# Patient Record
Sex: Female | Born: 1935 | Race: White | Hispanic: No | Smoking: Never smoker
Health system: Southern US, Community
[De-identification: ages and names within clinical notes are randomized; demographics above are authoritative.]

## PROBLEM LIST (undated history)

## (undated) DIAGNOSIS — I10 Essential (primary) hypertension: Secondary | ICD-10-CM

## (undated) DIAGNOSIS — N189 Chronic kidney disease, unspecified: Secondary | ICD-10-CM

## (undated) DIAGNOSIS — F329 Major depressive disorder, single episode, unspecified: Secondary | ICD-10-CM

## (undated) DIAGNOSIS — K219 Gastro-esophageal reflux disease without esophagitis: Secondary | ICD-10-CM

## (undated) DIAGNOSIS — I739 Peripheral vascular disease, unspecified: Secondary | ICD-10-CM

## (undated) DIAGNOSIS — G2581 Restless legs syndrome: Secondary | ICD-10-CM

## (undated) DIAGNOSIS — F32A Depression, unspecified: Secondary | ICD-10-CM

## (undated) DIAGNOSIS — I499 Cardiac arrhythmia, unspecified: Secondary | ICD-10-CM

## (undated) DIAGNOSIS — Z9289 Personal history of other medical treatment: Secondary | ICD-10-CM

## (undated) DIAGNOSIS — M1990 Unspecified osteoarthritis, unspecified site: Secondary | ICD-10-CM

## (undated) HISTORY — PX: JOINT REPLACEMENT: SHX530

## (undated) HISTORY — PX: SEPTOPLASTY: SUR1290

## (undated) HISTORY — PX: EYE SURGERY: SHX253

## (undated) HISTORY — PX: VARICOSE VEIN SURGERY: SHX832

## (undated) HISTORY — PX: APPENDECTOMY: SHX54

## (undated) HISTORY — PX: ABDOMINAL HYSTERECTOMY: SHX81

---

## 2000-04-08 ENCOUNTER — Encounter: Payer: Self-pay | Admitting: Family Medicine

## 2000-04-08 ENCOUNTER — Encounter: Admission: RE | Admit: 2000-04-08 | Discharge: 2000-04-08 | Payer: Self-pay | Admitting: Family Medicine

## 2000-05-31 ENCOUNTER — Ambulatory Visit (HOSPITAL_COMMUNITY): Admission: RE | Admit: 2000-05-31 | Discharge: 2000-05-31 | Payer: Self-pay | Admitting: Gastroenterology

## 2001-03-30 ENCOUNTER — Encounter: Payer: Self-pay | Admitting: Emergency Medicine

## 2001-03-30 ENCOUNTER — Emergency Department (HOSPITAL_COMMUNITY): Admission: EM | Admit: 2001-03-30 | Discharge: 2001-03-30 | Payer: Self-pay | Admitting: Emergency Medicine

## 2002-04-21 ENCOUNTER — Ambulatory Visit (HOSPITAL_COMMUNITY): Admission: RE | Admit: 2002-04-21 | Discharge: 2002-04-21 | Payer: Self-pay | Admitting: Internal Medicine

## 2002-04-21 ENCOUNTER — Encounter: Payer: Self-pay | Admitting: Internal Medicine

## 2005-04-24 ENCOUNTER — Ambulatory Visit: Payer: Self-pay | Admitting: Internal Medicine

## 2005-08-28 ENCOUNTER — Ambulatory Visit: Payer: Self-pay | Admitting: Internal Medicine

## 2005-08-31 ENCOUNTER — Ambulatory Visit: Payer: Self-pay | Admitting: Cardiology

## 2006-05-05 ENCOUNTER — Ambulatory Visit: Payer: Self-pay | Admitting: Internal Medicine

## 2007-05-30 ENCOUNTER — Encounter: Payer: Self-pay | Admitting: Internal Medicine

## 2007-05-30 DIAGNOSIS — J309 Allergic rhinitis, unspecified: Secondary | ICD-10-CM | POA: Insufficient documentation

## 2007-05-30 DIAGNOSIS — H6980 Other specified disorders of Eustachian tube, unspecified ear: Secondary | ICD-10-CM

## 2007-09-22 ENCOUNTER — Encounter: Admission: RE | Admit: 2007-09-22 | Discharge: 2007-09-22 | Payer: Self-pay | Admitting: Family Medicine

## 2008-02-15 ENCOUNTER — Inpatient Hospital Stay (HOSPITAL_COMMUNITY): Admission: EM | Admit: 2008-02-15 | Discharge: 2008-02-17 | Payer: Self-pay | Admitting: Emergency Medicine

## 2008-03-12 ENCOUNTER — Encounter: Admission: RE | Admit: 2008-03-12 | Discharge: 2008-03-12 | Payer: Self-pay | Admitting: Family Medicine

## 2010-07-13 DIAGNOSIS — Z9289 Personal history of other medical treatment: Secondary | ICD-10-CM

## 2010-07-13 HISTORY — DX: Personal history of other medical treatment: Z92.89

## 2010-11-25 NOTE — H&P (Signed)
NAMETENNA, LACKO NO.:  [id number]   MEDICAL RECORD NO.:  [id number]          PATIENT TYPE:  INP   LOCATION:  5504                         FACILITY:  [facility]   PHYSICIAN:  Michiel Cowboy, MDDATE OF BIRTH:  08/23/1927   DATE OF ADMISSION:  02/16/2008  DATE OF DISCHARGE:                              HISTORY & PHYSICAL   PRIMARY CARE Viera Okonski:  Dr. Foy Guadalajara.   CHIEF COMPLAINT:  Elevated blood pressure.   The patient is a 77 year old female with history of hypertension,  allergic rhinitis, eustachian dysfunction and GERD, who for the past few  weeks has been undergoing changes to her medications secondary to her  blood pressure being very labile, fluctuating from 90s systolic to 120  systolic.  Dr. Foy Guadalajara has changed her medications.  He stopped her  atenolol and terazosin and put her on Bystolic which is a different beta-  blocker.  He continued her enalapril.  Unfortunately soon thereafter,  her blood pressure went high and was having a couple readings in the  200s.  She called the office and was told to present to the emergency  department, she did.  She denied any chest pain or shortness of breath.  She does endorse some substernal burning and dyspepsia that has been  going on for a few days now.  She also reports that overall she is  feeling weak and unwell.  She has been having an unsteady gait for the  past 2 months or so.  No fevers, no chills, no nausea, no vomiting, no  diarrhea, no constipation.  Otherwise review of systems is negative.   PAST MEDICAL HISTORY:  Per above.   CURRENT MEDICATIONS:  1. Enalapril 20 mg p.o. daily.  2. Neurontin 300 mg p.o. twice daily.  3. Protonix 20 mg p.o. daily.  4. Multivitamin.  5. Vitamin D.  6. Ocuvite.  7. Fish oil.   SOCIAL HISTORY:  The patient does not smoke, drink or use drugs.  Lives  at home.   FAMILY HISTORY:  Noncontributory.   PHYSICAL EXAMINATION:  VITAL SIGNS:  Temperature 97.6, heart rate  61,  respirations 15, blood pressure 176/80.  Sating 98% on room air.  GENERAL:  The patient appears to be in no acute distress.  Elderly  female, very pleasant.  HEENT:  Head nontraumatic.  Moist mucous membranes.  SKIN:  Slightly diminished skin turgor.  LUNGS:  Clear to auscultation bilaterally.  HEART:  Regular rate and rhythm.  No murmurs, rubs or gallops.  ABDOMEN:  Soft with slight epigastric tenderness.  LOWER EXTREMITIES:  Without edema.  NEUROLOGIC:  Intact.   LABORATORY DATA:  White blood cell count 5.2, hemoglobin 12.2, sodium  128, potassium 4.3, creatinine 1.4.  Cardiac enzymes negative x2.  Urine  osmolality 150, urine sodium 44, but this was after administration of  normal saline.  UA showed no evidence of infection.  No chest x-ray was  obtained, but EKG showing sinus bradycardia, heart rate 59.   ASSESSMENT/PLAN:  This is a 77 year old female with severe hypertension,  poorly controlled on current medications.   1. Hypertension.  Looking back, the patient was on 100 of atenolol,      was switched to 5 of Bystolic, which is unsure of dose, maybe this      explaining why she is now hypertensive.  Will increase the Bystolic      to 20 with holding parameters.  Continue enalapril.  Add Norvasc      and titrate up if needed.  Hydralazine p.r.n.  2. Hyponatremia.  Perhaps may explain some unsteadiness on her feet      that she has been experiencing, although not sure what the duration      of hyponatremia.  The patient appears slightly dehydrated, will      give IV fluids and monitor.  Will check orthostatics.  Will also      check TSH and cortisol level.  Chest x-ray.  3. Epigastric burning.  Wonder if this is secondary to      gastroesophageal reflux disease.  Will increase dose of Protonix to      40 and give some Carafate to see if this improves this.  4. Unsteady gait.  Will have formal physical therapy/occupational      therapy evaluation.  Will check B12 and  folate level, TSH and would      have a CT scan of the head done as this has been going on for a      couple of months and if she had a stroke this should show.  5. Prophylaxis, Protonix plus sequential compression devices.  6. The patient is do not resuscitate/do not intubate as per her      wishes.      Michiel Cowboy, MD  Electronically Signed     AVD/MEDQ  D:  02/16/2008  T:  02/16/2008  Job:  [id number]   cc:   Molly Maduro L. Foy Guadalajara, M.D.

## 2010-11-25 NOTE — Discharge Summary (Signed)
NAMELUCI, BELLUCCI NO.:  [id number]   MEDICAL RECORD NO.:  [id number]          PATIENT TYPE:  INP   LOCATION:  5504                         FACILITY:  [facility]   PHYSICIAN:  Hollice Espy, M.D.DATE OF BIRTH:  02-25-1928   DATE OF ADMISSION:  02/15/2008  DATE OF DISCHARGE:  02/17/2008                               DISCHARGE SUMMARY   PRIMARY CARE PHYSICIAN:  Robert L. Foy Guadalajara, MD   DISCHARGE DIAGNOSES:  1. Malignant hypertension.  2. Abnormal lab findings, not otherwise specified.  3. Deconditioning.  4. Dehydration with intravascular volume depletion, now resolved.   Discharge medications for this patient, the patient's antihypertensive  medications have been clarified.  Her new regimen, at least for the  short term, will be as follows:  1. Enalapril 20.  2. Neurontin 300 b.i.d.  3. Protonix 40 which is an increase from previous of 20.  4. Multivitamin daily.  5. Vitamin D 1200 p.o. daily.  6. Ocuvite 1 tab p.o. daily.  7. Omega-3 fish oil daily.  8. Bystolic 20 mg p.o. daily.  The patient previously was unclear of      she is on what dose of Bystolic she is on and thought it might be      5, it has been now increased, so at least it is going to be at 20      mg p.o. daily.   HOSPITAL COURSE:  The patient is a 77 year old white female with past  medical history of resistant hypertension in the past who was  complaining of weakness and dizziness and had an elevated blood pressure  in the systolically 200s upon admission.  Initially, despite her  elevated blood pressures, thought she felt that she was intravascularly  volume depleted and she was started on IV fluids.  She was noted to be  dizzy every time she stood up or sat up.  Following IV fluids by  hospital day #2, she was feeling much better.  She was evaluated by  Physical and Occupational Therapy who found her to be deconditioned and  recommended home heath PT/OT.  In the meantime, her PCP who  had been  following her blood pressure issues was concerned about the possibility  of the patient having a pheochromocytoma, a rare malignant catecholamine-  secreting tumor.  The patient had a fractionated metanephrine test done  drawn from the blood in Dr. Pablo Lawrence office, and these results came back  elevated for serum plasma norepinephrine level elevated at 638 with the  normal level being between 0 and 399.  After Dr. Foy Guadalajara received these  results, he contacted myself and we discussed treatment options from  there.  After I reviewed the literature of pheochromocytoma, it was  found that the sensitivity for pheochromocytoma drops after the patient  has an age older than 67 and the best confirmatory test for the patient  will be a combination of a plasma fractionated metanephrine test and a  24-hour urine collection for metanephrines and catecholamines as well as  checking urinary creatinine to assure adequate volume intake, the  adequate volume standby.  The patient currently is undergoing her 24-  hour urine collection to ensure that she has had a full urine intake so  as to allow [place] to further monitor her blood pressure.  She will complete  her urinary collection at 2 p.m. at this time, then it will be a send-  off lab for metanephrine analysis.  In the meantime, the patient's blood  pressure remained stable.  She had 1 brief spike with a systolic of 190  at 11 p.m. on February 15, 2008.  However, overall her blood pressure  continues to remain improved and she feels stable and  steady.  The patient's overall disposition is improved.  Her activity is  as tolerated by PT/OT.  Her discharge diet is low-sodium diet, and she  will follow up with Dr. Marinda Elk, PCP next week in his office.  At  that time, he will evaluate her urinary analysis to determine if she  truly has pheochromocytoma.      Hollice Espy, M.D.  Electronically Signed     SKK/MEDQ  D:  02/17/2008  T:   02/18/2008  Job:  [id number]   cc:   Molly Maduro L. Foy Guadalajara, M.D.

## 2010-11-28 NOTE — Assessment & Plan Note (Signed)
[facility]                               PULMONARY OFFICE NOTE   [name], [name]                      MRN:          [id number]  DATE:05/05/2006                            DOB:          Dec 07, 1927    PROBLEM:  1. Allergic rhinitis.  2. Vasomotor rhinitis.  3. Eustachian dysfunction.   HISTORY:  She returns for follow-up after last here in February to tell me  that she feels fine with her nose and sinuses now. A CT scan of her sinuses  in February 2007, had shown a sphenoid opacification and partial involvement  of the posterior ethmoid sinuses consistent with a sinusitis. That resolved  with Avalox for two weeks. She has had no problems since then through the  summer and feels well today. She is using Astelin p.r.n. I offered flu  vaccine and she declined.   MEDICATIONS:  1. Enalapril 20 mg.  2. Terazosin.  3. Atenolol 100 mg.  4. Gabapentin 300 mg.  5. Premarin 6.25.  6. Guaifenesin-pseudoephedrine generic decongestant p.r.n.  7. Prilosec.  8. Astelin.   DRUG INTOLERANCES:  PENICILLIN AND ASPIRIN.   OBJECTIVE:  Weight: 177 pounds.  Blood pressure: 126/80.  Pulse: Regular,  58.  Room air saturation: 98%.  Her conjunctivae are clear. Nasopharynx is clear with no evidence of  drainage and unobstructed. There is no peri-orbital edema. No stridor.  LUNGS:  Clear.  HEART: Heart sounds regular without murmur.   IMPRESSION:  1. Allergic rhinitis, under good control.  2. Sinusitis, symptomatically resolved after antibiotics last winter.   PLAN:  1. We refilled her guaifenesin-pseudoephedrine product for b.i.d. p.r.n.      use and refilled      Astelin.  2. Schedule return one year, earlier p.r.n.       Clinton D. Maple Hudson, MD, FCCP, FACP      CDY/MedQ  DD:  05/05/2006  DT:  05/06/2006  Job #:  [id number]   cc:   Molly Maduro L. Foy Guadalajara, M.D.

## 2010-11-28 NOTE — Procedures (Signed)
[place]  Patient:    [name], [name]                      MRN: [id number] Proc. Date: 05/31/00 Adm. Date:  [id number] Attending:  Rich Brave CC:         Marinda Elk, M.D.   Procedure Report  PROCEDURE:  Upper endoscopy.  INDICATION:  Longstanding reflux symptoms, which have been somewhat refractory to antireflux therapy.  FINDINGS:  Normal exam.  DESCRIPTION OF PROCEDURE:  The nature, purpose, and risks of the procedure had been reviewed with the patient, who provided written consent.  Sedation for this procedure and the colonoscopy which followed it totalled fentanyl 75 mcg and Versed 7 mg IV without arrhythmias or desaturation.  The Olympus video endoscope was passed under direct vision quite easily.  The vocal cords and larynx looked grossly normal, without overt reflux-related changes.  The esophageal mucosa was normal, without evidence of Barretts esophagus, reflux esophagitis, varices, infection, or neoplasia.  No ring, stricture, or hiatal hernia was identified.  The squamocolumnar junction was noted to be right at the level of the diaphragmatic hiatus.  The stomach was entered.  It contained no significant residual and had normal mucosa without evidence of gastritis, erosion, polyps, or masses, and a retroflex view of the proximal stomach was unremarkable.  Again, there was no evidence of a hiatal hernia.  The pylorus, duodenal bulb, and second duodenum were unremarkable in appearance.  The scope was removed from the patient, who tolerated the procedure well and without apparent complication.  No biopsies were obtained.  IMPRESSION:  Normal upper endoscopy.  PLAN:  Continue antipeptic therapy to control symptoms. DD:  05/31/00 TD:  05/31/00 Job: [id number]

## 2011-03-10 ENCOUNTER — Other Ambulatory Visit (HOSPITAL_COMMUNITY): Payer: Self-pay | Admitting: Anesthesiology

## 2011-03-10 ENCOUNTER — Other Ambulatory Visit: Payer: Self-pay | Admitting: Orthopedic Surgery

## 2011-03-10 ENCOUNTER — Other Ambulatory Visit (HOSPITAL_COMMUNITY): Payer: Self-pay | Admitting: Orthopedic Surgery

## 2011-03-10 ENCOUNTER — Encounter (HOSPITAL_COMMUNITY): Payer: Medicare Other

## 2011-03-10 ENCOUNTER — Ambulatory Visit (HOSPITAL_COMMUNITY)
Admission: RE | Admit: 2011-03-10 | Discharge: 2011-03-10 | Disposition: A | Discharge status: Home / Self Care | Payer: Medicare Other | Source: Ambulatory Visit | Attending: Anesthesiology | Admitting: Anesthesiology

## 2011-03-10 DIAGNOSIS — J449 Chronic obstructive pulmonary disease, unspecified: Secondary | ICD-10-CM | POA: Insufficient documentation

## 2011-03-10 DIAGNOSIS — J4489 Other specified chronic obstructive pulmonary disease: Secondary | ICD-10-CM | POA: Insufficient documentation

## 2011-03-10 DIAGNOSIS — Z01812 Encounter for preprocedural laboratory examination: Secondary | ICD-10-CM | POA: Insufficient documentation

## 2011-03-10 DIAGNOSIS — Z01811 Encounter for preprocedural respiratory examination: Secondary | ICD-10-CM

## 2011-03-10 DIAGNOSIS — M1710 Unilateral primary osteoarthritis, unspecified knee: Secondary | ICD-10-CM | POA: Insufficient documentation

## 2011-03-10 DIAGNOSIS — Z01818 Encounter for other preprocedural examination: Secondary | ICD-10-CM | POA: Insufficient documentation

## 2011-03-10 LAB — URINALYSIS, ROUTINE W REFLEX MICROSCOPIC
Glucose, UA: NEGATIVE mg/dL
Ketones, ur: NEGATIVE mg/dL
Leukocytes, UA: NEGATIVE
Nitrite: NEGATIVE
Protein, ur: NEGATIVE mg/dL
Urobilinogen, UA: 0.2 mg/dL (ref 0.0–1.0)

## 2011-03-10 LAB — COMPREHENSIVE METABOLIC PANEL
Albumin: 3.8 g/dL (ref 3.5–5.2)
Alkaline Phosphatase: 76 U/L (ref 39–117)
BUN: 17 mg/dL (ref 6–23)
Calcium: 10 mg/dL (ref 8.4–10.5)
Creatinine, Ser: 1.12 mg/dL — ABNORMAL HIGH (ref 0.50–1.10)
GFR calc Af Amer: 56 mL/min — ABNORMAL LOW (ref >60)
Glucose, Bld: 100 mg/dL — ABNORMAL HIGH (ref 70–99)
Total Protein: 7.3 g/dL (ref 6.0–8.3)

## 2011-03-10 LAB — APTT: aPTT: 32 seconds (ref 24–37)

## 2011-03-10 LAB — CBC
Hemoglobin: 12.8 g/dL (ref 12.0–15.0)
MCH: 30.3 pg (ref 26.0–34.0)
MCHC: 34.6 g/dL (ref 30.0–36.0)
Platelets: 212 10*3/uL (ref 150–400)

## 2011-03-10 LAB — PROTIME-INR
INR: 0.96 (ref 0.00–1.49)
Prothrombin Time: 13 seconds (ref 11.6–15.2)

## 2011-03-10 LAB — SURGICAL PCR SCREEN: MRSA, PCR: NEGATIVE

## 2011-03-18 ENCOUNTER — Inpatient Hospital Stay (HOSPITAL_COMMUNITY)
Admission: RE | Admit: 2011-03-18 | Discharge: 2011-03-25 | DRG: 470 | Disposition: A | Discharge status: Home Health | Payer: Medicare Other | Source: Ambulatory Visit | Attending: Orthopedic Surgery | Admitting: Orthopedic Surgery

## 2011-03-18 DIAGNOSIS — Z86718 Personal history of other venous thrombosis and embolism: Secondary | ICD-10-CM

## 2011-03-18 DIAGNOSIS — D500 Iron deficiency anemia secondary to blood loss (chronic): Secondary | ICD-10-CM | POA: Diagnosis present

## 2011-03-18 DIAGNOSIS — K219 Gastro-esophageal reflux disease without esophagitis: Secondary | ICD-10-CM | POA: Diagnosis present

## 2011-03-18 DIAGNOSIS — Z01812 Encounter for preprocedural laboratory examination: Secondary | ICD-10-CM

## 2011-03-18 DIAGNOSIS — I8390 Asymptomatic varicose veins of unspecified lower extremity: Secondary | ICD-10-CM | POA: Diagnosis present

## 2011-03-18 DIAGNOSIS — H9190 Unspecified hearing loss, unspecified ear: Secondary | ICD-10-CM | POA: Diagnosis present

## 2011-03-18 DIAGNOSIS — K3189 Other diseases of stomach and duodenum: Secondary | ICD-10-CM | POA: Diagnosis not present

## 2011-03-18 DIAGNOSIS — I10 Essential (primary) hypertension: Secondary | ICD-10-CM | POA: Diagnosis present

## 2011-03-18 DIAGNOSIS — Z79899 Other long term (current) drug therapy: Secondary | ICD-10-CM

## 2011-03-18 DIAGNOSIS — Z880 Allergy status to penicillin: Secondary | ICD-10-CM

## 2011-03-18 DIAGNOSIS — H3530 Unspecified macular degeneration: Secondary | ICD-10-CM | POA: Diagnosis present

## 2011-03-18 DIAGNOSIS — R269 Unspecified abnormalities of gait and mobility: Secondary | ICD-10-CM | POA: Diagnosis present

## 2011-03-18 DIAGNOSIS — R112 Nausea with vomiting, unspecified: Secondary | ICD-10-CM | POA: Diagnosis not present

## 2011-03-18 DIAGNOSIS — M1710 Unilateral primary osteoarthritis, unspecified knee: Principal | ICD-10-CM | POA: Diagnosis present

## 2011-03-18 LAB — ABO/RH: ABO/RH(D): O NEG

## 2011-03-18 NOTE — H&P (Signed)
[name], [name] NO.:  [id number]  MEDICAL RECORD NO.:  [id number]  LOCATION:                               FACILITY:  [facility]  PHYSICIAN:  Ollen Gross, M.D.    DATE OF BIRTH:  1928-03-04  DATE OF ADMISSION:  03/18/2011 DATE OF DISCHARGE:                             HISTORY & PHYSICAL   CHIEF COMPLAINT:  Right knee pain.  HISTORY OF PRESENT ILLNESS:  The patient is an 77 year old female who has been seen by Dr. Lequita Halt for ongoing knee pain.  She has been treated for bilateral knee pain for quite sometime now.  The knees have been alternating in pain.  She had been treated conservatively in the past including injections which she has had viscous supplementations earlier this year.  At times, the right knee has been hurting more. More recently, this summer, the left knee started hurting which she received injections for.  At this point, the right knee is at a point where she would benefit from undergoing surgical intervention.  She is noted already to have bone on bone changes and felt to be a good candidate for surgery.  Risks and benefits have been discussed.  She elected to proceed with surgery.  ALLERGIES/INTOLERANCES: 1. PENICILLIN causes rash. 2. ASPIRIN causes her heart to race.  CURRENT MEDICATIONS:  Acebutal, enalapril, amlodipine, gabapentin, pantoprazole, ropinirole, glycol, vitamin D, Ocuvite, Osteo Bi-Flex.  PAST MEDICAL HISTORY: 1. Mild impaired hearing. 2. Early macular degeneration. 3. Hypertension. 4. Reflux disease. 5. Varicose veins. 6. Past history of phlebitis/DVT 30 years ago. 7. Childhood illnesses of measles and mumps.  PAST SURGICAL HISTORY:  Varicose vein surgery, nasal septal surgery, hysterectomy, appendectomy, D and C's x2.  FAMILY HISTORY:  Father with heart disease, hypertension, and stroke. Mother with "Bauserman lung."  One sister with stomach cancer.  One sister with lung cancer.  A brother with history of  pulmonary embolisms another brother with Parkinson's and another sister with pancreatic cancer.  SOCIAL HISTORY:  Widowed, retired, nonsmoker.  No alcohol.  Lives alone. She does have a caregiver lined up.  She has a ramp entering home.  She does have a living will healthcare power of attorney.  REVIEW OF SYSTEMS:  GENERAL:  No fever, chills, or night sweats. NEUROLOGIC:  She does have some decreased hearing loss and decreased vision.  No seizures, syncope, or paralysis.  RESPIRATORY:  No shortness of breath, productive cough, or hemoptysis.  CARDIOVASCULAR:  No chest pain, angina. She does have some difficulty breathing lying flat, but this does not appear to be orthopnea, it is more that she is able to sleep on her side.  GI:  No nausea, vomiting, diarrhea, or constipation. GU:  No dysuria, hematuria, or discharge.  MUSCULOSKELETAL:  Knee pain.  PHYSICAL EXAMINATION:  VITAL SIGNS:  Pulse 72, respirations 14, blood pressure 163/83. GENERAL:  An 77 year old white female, well nourished, well developed, no acute distress.  She is alert, oriented, and cooperative, has somewhat mildly flat affect.  She is accompanied by her granddaughter. HEENT:  Normocephalic, atraumatic.  Pupils are round and reactive.  EOMs intact. NECK:  Supple. CHEST:  Clear. HEART:  Regular rate and rhythm  with a grade 2/6 systolic ejection murmur. ABDOMEN:  Soft, nontender, slightly round.  Bowel sounds present. RECTAL/BREAST/GENITALIA:  Not done, not pertinent to present illness. EXTREMITIES:  Right knee, range of motion 0-135, moderate crepitus, no effusion, antalgic gait.  Left knee, range of most 5-135, moderate crepitus, no effusion.  IMPRESSION:  Osteoarthritis, right knee.  PLAN:  The patient will be admitted to [facility] to undergo right total knee replacement arthroplasty.  Surgery will be performed by Dr. Ollen Gross.  Her tentative plan is to go home with family following surgery.   There are no contraindications to the above procedure, such as ongoing infection or any type of progressive neurological disease.     Alexzandrew L. Julien Girt, P.A.C.   ______________________________ Ollen Gross, M.D.    ALP/MEDQ  D:  03/16/2011  T:  03/16/2011  Job:  [id number]  cc:   Molly Maduro L. Foy Guadalajara, M.D. Fax: [phone]  Bernette Redbird, M.D. Fax: [phone]  Courtney Paris, M.D. Fax: [phone]  Ilda Mori, M.D. Fax: [phone]  Dr. Arbutus Ped  Electronically Signed by Patrica Duel P.A.C. on 03/18/2011 10:00:36 AM Electronically Signed by Ollen Gross M.D. on 03/18/2011 10:05:07 AM

## 2011-03-19 LAB — BASIC METABOLIC PANEL
CO2: 24 mEq/L (ref 19–32)
Glucose, Bld: 183 mg/dL — ABNORMAL HIGH (ref 70–99)
Potassium: 4.4 mEq/L (ref 3.5–5.1)
Sodium: 132 mEq/L — ABNORMAL LOW (ref 135–145)

## 2011-03-19 LAB — CBC
HCT: 25.6 % — ABNORMAL LOW (ref 36.0–46.0)
Hemoglobin: 8.8 g/dL — ABNORMAL LOW (ref 12.0–15.0)
MCH: 30.2 pg (ref 26.0–34.0)
RBC: 2.91 MIL/uL — ABNORMAL LOW (ref 3.87–5.11)

## 2011-03-20 LAB — BASIC METABOLIC PANEL
BUN: 15 mg/dL (ref 6–23)
CO2: 26 mEq/L (ref 19–32)
Chloride: 100 mEq/L (ref 96–112)
Creatinine, Ser: 0.94 mg/dL (ref 0.50–1.10)
Glucose, Bld: 113 mg/dL — ABNORMAL HIGH (ref 70–99)

## 2011-03-20 LAB — CBC
HCT: 24.6 % — ABNORMAL LOW (ref 36.0–46.0)
Hemoglobin: 8.6 g/dL — ABNORMAL LOW (ref 12.0–15.0)
MCV: 85.4 fL (ref 78.0–100.0)
RBC: 2.88 MIL/uL — ABNORMAL LOW (ref 3.87–5.11)
WBC: 7.6 10*3/uL (ref 4.0–10.5)

## 2011-03-21 LAB — BASIC METABOLIC PANEL
BUN: 15 mg/dL (ref 6–23)
Chloride: 98 mEq/L (ref 96–112)
Creatinine, Ser: 0.81 mg/dL (ref 0.50–1.10)
GFR calc Af Amer: >60 mL/min (ref >60)

## 2011-03-21 LAB — CBC
HCT: 22.5 % — ABNORMAL LOW (ref 36.0–46.0)
MCH: 29.9 pg (ref 26.0–34.0)
MCHC: 34.7 g/dL (ref 30.0–36.0)
MCV: 86.2 fL (ref 78.0–100.0)
RDW: 15 % (ref 11.5–15.5)
WBC: 7.8 10*3/uL (ref 4.0–10.5)

## 2011-03-22 LAB — TYPE AND SCREEN
ABO/RH(D): O NEG
Antibody Screen: NEGATIVE
Unit division: 0

## 2011-03-22 LAB — CBC
HCT: 22.8 % — ABNORMAL LOW (ref 36.0–46.0)
MCV: 88 fL (ref 78.0–100.0)
RDW: 15.4 % (ref 11.5–15.5)
WBC: 7.1 10*3/uL (ref 4.0–10.5)

## 2011-03-22 LAB — BASIC METABOLIC PANEL
BUN: 16 mg/dL (ref 6–23)
CO2: 27 mEq/L (ref 19–32)
Chloride: 100 mEq/L (ref 96–112)
Creatinine, Ser: 0.91 mg/dL (ref 0.50–1.10)
GFR calc Af Amer: >60 mL/min (ref >60)

## 2011-03-22 NOTE — Op Note (Signed)
[name], [name] NO.:  [id number]  MEDICAL RECORD NO.:  [id number]  LOCATION:  1618                         FACILITY:  [facility]  PHYSICIAN:  Ollen Gross, M.D.    DATE OF BIRTH:  [birth year]  DATE OF PROCEDURE:  03/18/2011 DATE OF DISCHARGE:                              OPERATIVE REPORT   PREOPERATIVE DIAGNOSIS:  Osteoarthritis of right knee.  POSTOPERATIVE DIAGNOSIS:  Osteoarthritis of right knee.  PROCEDURE:  Right total knee arthroplasty.  SURGEON:  Ollen Gross, M.D.  ASSISTANT:  Alexzandrew L. Perkins, P.A.C.  ANESTHESIA:  General.  ESTIMATED BLOOD LOSS:  Minimal.  DRAINS:  Hemovac x1.  TOURNIQUET TIME:  34 minutes at 300 mmHg.  COMPLICATIONS:  None.  CONDITION:  Stable to recovery.  BRIEF CLINICAL NOTE:  [name] is an 77 year old female with severe end- stage arthritis of the right knee with progressively worsening pain and dysfunction.  She has had recurrent effusions, has bone-on-bone arthritis in the lateral compartment with possible element of osteonecrosis also.  She has had long-term nonoperative management including rest, analgesics, and injections.  She has persistently worsening pain and dysfunction.  She does not have contraindications to surgery.  She presents now for right total knee arthroplasty.  PROCEDURE IN DETAIL:  After successful administration of general anesthetic, a tourniquet was placed high on her right thigh, and right lower extremity was prepped and draped in the usual sterile fashion. Extremities wrapped in Esmarch, knee flexed, and tourniquet inflated to 300 mmHg.  A midline incision was made with a #10 blade through subcutaneous tissue to the level of the extensor mechanism.  A fresh blade was used make a medial parapatellar arthrotomy.  She had a fair amount of synovitis.  The soft tissue on the proximal medial tibia subperiosteally elevated to the joint line with a knife and into the semimembranosus  bursa with a Cobb elevator.  Soft tissue laterally was also elevated with attention being paid to avoid the patellar tendon on the tibial tubercle.  Patella was everted, knee flexed to 90 degrees. ACL and PCL removed.  She had bone-on-bone change in the lateral patellofemoral compartments with some osteonecrotic appearing bone laterally.  A drill was used to create a starting hole in the distal femur.  Canal was thoroughly irrigated.  The 5-degree right valgus alignment guide was placed and the distal femoral cutting block was pinned to remove 11 mm off the distal femur.  Distal femoral resection was made with an oscillating saw.  The tibia subluxed forward and the menisci removed.  She had exposed bone in the lateral tibial plateau also.  The extramedullary cutting guide was placed referencing proximally to the medial aspect of the tibial tubercle and distally along the second metatarsal axis and tibial crest.  Block was pinned to remove 2 mm off the more deficient lateral side.  Tibial resection was made with an oscillating saw.  Size 4 was the most appropriate tibial component and the proximal tibia was prepared to modular drill and keel punch for the size 4.  Femoral sizing guide was placed, size 4, most appropriate on the femur. Rotation was marked at the epicondylar axis and confirmed by creating  rectangular flexion gap at 90 degrees.  The block was pinned in this rotation and the anterior-posterior chamfer cuts were made. Intercondylar block was placed.  The neck cut was made.  Trial size 4 posterior stabilized femur was placed.  A 10 mm posterior stabilized rotating platform insert trial was placed with a 10 full extension to achieve with excellent varus valgus anterior-posterior balance throughout full range of motion.  Patella was everted, thickness measured to be 24 mm.  Freehand resection taken to 14 mm, a 35 template was placed, lug holes were drilled, trial patella was  placed and it tracked normally.  Osteophytes were removed off the posterior femur with the trial in place.  All trials were removed and the cut bone surfaces were prepared with pulsatile lavage.  Cement was mixed and once ready for implantation, the size 4 bearing tibial tray size 4 posterior stabilized femur and 35 patella were cemented into place.  The patella was held with a clamp.  A trial 10 mm insert was placed.  Knee held in full extension and all extruded cement was removed.  Once the cement fully hardened, then the permanent 10 mm posterior stabilized rotating platform insert was placed into the tibial tray.  Wound was copiously irrigated with saline solution and the synovectomy was completed.  The arthrotomy was then closed over Hemovac drain with interrupted #1 PDS. Flexion against gravity was 135 degrees.  Patella tracked normally. Tourniquet was released, total time of 34 minutes.  Subcu was closed with interrupted 2-0 Vicryl subcuticular and running 4-0 Monocryl. Catheter put in, Marcaine pain pump was placed and pump initiated. Incisions cleaned and dried and Steri-Strips and bulky sterile dressing were applied.  She was then placed into a knee immobilizer, awakened and transferred to recovery in stable condition.     Ollen Gross, M.D.     FA/MEDQ  D:  03/18/2011  T:  03/18/2011  Job:  [id number]  Electronically Signed by Ollen Gross M.D. on 03/22/2011 12:20:39 PM

## 2011-03-23 LAB — CBC
HCT: 22.2 % — ABNORMAL LOW (ref 36.0–46.0)
Hemoglobin: 7.8 g/dL — ABNORMAL LOW (ref 12.0–15.0)
MCH: 31.3 pg (ref 26.0–34.0)
MCHC: 35.1 g/dL (ref 30.0–36.0)
MCV: 89.2 fL (ref 78.0–100.0)

## 2011-03-24 LAB — DIFFERENTIAL
Basophils Relative: 0 % (ref 0–1)
Eosinophils Absolute: 0.3 10*3/uL (ref 0.0–0.7)
Eosinophils Relative: 3 % (ref 0–5)
Monocytes Absolute: 1.5 10*3/uL — ABNORMAL HIGH (ref 0.1–1.0)
Monocytes Relative: 15 % — ABNORMAL HIGH (ref 3–12)

## 2011-03-24 LAB — CBC
MCH: 31.3 pg (ref 26.0–34.0)
MCHC: 35.1 g/dL (ref 30.0–36.0)
Platelets: 183 10*3/uL (ref 150–400)

## 2011-03-24 LAB — BASIC METABOLIC PANEL
GFR calc Af Amer: >60 mL/min (ref >60)
GFR calc non Af Amer: >60 mL/min (ref >60)
Glucose, Bld: 123 mg/dL — ABNORMAL HIGH (ref 70–99)
Potassium: 3.8 mEq/L (ref 3.5–5.1)
Sodium: 130 mEq/L — ABNORMAL LOW (ref 135–145)

## 2011-03-25 LAB — CBC
MCH: 29.6 pg (ref 26.0–34.0)
MCHC: 33.8 g/dL (ref 30.0–36.0)
Platelets: 202 10*3/uL (ref 150–400)
RDW: 16.6 % — ABNORMAL HIGH (ref 11.5–15.5)

## 2011-03-25 LAB — BASIC METABOLIC PANEL
Calcium: 8.5 mg/dL (ref 8.4–10.5)
GFR calc non Af Amer: >60 mL/min (ref >60)
Sodium: 131 mEq/L — ABNORMAL LOW (ref 135–145)

## 2011-03-25 LAB — CROSSMATCH
Antibody Screen: NEGATIVE
Unit division: 0
Unit division: 0

## 2011-04-08 NOTE — Consult Note (Signed)
NAMEJESSECA, MARSCH NO.:  [id number]  MEDICAL RECORD NO.:  [id number]  LOCATION:  1618                         FACILITY:  [facility]  PHYSICIAN:  Willis Modena, MD     DATE OF BIRTH:  1927/11/05  DATE OF CONSULTATION:  03/23/2011 DATE OF DISCHARGE:                                CONSULTATION   REASON FOR CONSULTATION:  Postoperative anemia.  CHIEF COMPLAINT:  Weakness.  HISTORY OF PRESENT ILLNESS:  Ms. Blas is an 77 year old female who underwent right knee replacement 5 days ago for symptomatic osteoarthritis.  Postoperatively, she has had a slow persistent downward trajectory of her hemoglobin.  Her hemoglobin on a couple of weeks preoperatively was about 12 and was 8.8 one day postoperatively.  Her hemoglobin has dropped from 8.8 to 7.8 over the past several days, despite receiving about 4 units of blood.  She has chronic burning and dyspeptic symptoms in her epigastric area, otherwise has no appreciable GI symptoms.  She specifically denies any dysphagia, odynophagia, and specifically denies any change in her bowel habits or overt blood in her stool. She has had Hemoccult checked on 1 stool and that was negative.  Past medical history, past surgical history, home medications, allergies, family history, social history, review of systems, all from dictated note from Dr. Lequita Halt dated, March 16, 2011.  I have reviewed and I agree.  PHYSICAL EXAMINATION:  VITAL SIGNS:  Blood pressure is 134/80, temperature 98.8, respiratory rate 16, heart rate 87, oxygen saturation 96% on room air.  GENERAL:  Ms. Gallentine is much younger appearing than stated age.  She is not acutely ill. ENT:  Normocephalic, atraumatic.  No oropharyngeal lesions. EYES:  Sclerae anicteric.  Conjunctivae somewhat pale. LUNGS:  Clear. HEART:  Regular. ABDOMEN:  Soft, protuberant, nontender, nondistended.  Normoactive bowel sounds. EXTREMITIES:  She has a brace of her right knee with  limited postoperative mobility. NEUROLOGIC:  Diffusely weak with some more focal weakness on the right knee postoperatively.  Otherwise, no lateralizing signs.  LABORATORY STUDIES:  Hemoglobin a couple of weeks preoperatively was 12.8, one day postoperatively was 8.8, has dropped to 7.8 today which is in the midst of having received 4 units of blood.  Platelet count 143, white count 7.8.  Sodium 133, potassium 3.2, chloride 100, bicarbonate 27, BUN 16, creatinine 0.9, glucose is 123.  Hemoccult stools x1 are negative.  No recent radiologic studies.  IMPRESSION:  Ms. Ohanian is an 77 year old female with slow downward progression of her hemoglobin.  She has no overt bleeding.  Her stools are Hemoccult negative x1.  She has a history of colonoscopy in 2001 by Dr. Matthias Hughs that was normal and has had a few endoscopies the last, per her dyspeptic symptoms, was a few years ago and was essentially normal except for some possible bile gastritis.  She has no overt bleeding.  PLAN: 1. Would suggest addition of Carafate for her dyspeptic symptoms. 2. Would continue Protonix b.i.d. 3. I have advised colonoscopy to ensure there is no ulcer present, but     she is reluctant to undergo any type of endoscopic procedure in the     absence of overt bleeding. 4. In  the absence of overt bleeding, in her immediate postop setting     with poor mobility, I certainly agree with holding off on     colonoscopy at this time unless absolutely warranted, but certainly     think that that should be pursued on an expedited fashion as an     outpatient. 5. We will follow along with you.  Thank you again for allowing me to participate in Ms. Carreiro's care.     Willis Modena, MD     WO/MEDQ  D:  03/23/2011  T:  03/23/2011  Job:  [id number]  Electronically Signed by Willis Modena  on 04/08/2011 05:51:49 PM

## 2011-04-10 LAB — TSH
TSH: 3.83
TSH: 4.097

## 2011-04-10 LAB — CATECHOLAMINES, FRACTIONATED, URINE, 24 HOUR: Total urine volume: 4900 mL

## 2011-04-10 LAB — COMPREHENSIVE METABOLIC PANEL
ALT: 16
AST: 21
Albumin: 3 — ABNORMAL LOW
Alkaline Phosphatase: 51
Calcium: 8.7
GFR calc Af Amer: 58 — ABNORMAL LOW
Potassium: 3.8
Sodium: 129 — ABNORMAL LOW
Total Protein: 5.7 — ABNORMAL LOW

## 2011-04-10 LAB — METANEPHRINES, URINE, 24 HOUR
Metaneph Total, Ur: 471 mcg/24 h (ref 224–832)
Metanephrines, Ur: 114 mcg/24h (ref 90–315)
Normetanephrine, 24H Ur: 357 mcg/24 h (ref 122–676)

## 2011-04-10 LAB — CBC
Hemoglobin: 11.3 — ABNORMAL LOW
MCHC: 33.7
MCHC: 34.3
Platelets: 160
RBC: 3.92
RDW: 14.5
WBC: 5.2

## 2011-04-10 LAB — CK TOTAL AND CKMB (NOT AT ARMC): CK, MB: 2.2

## 2011-04-10 LAB — URINALYSIS, ROUTINE W REFLEX MICROSCOPIC
Glucose, UA: NEGATIVE
Nitrite: NEGATIVE
Protein, ur: NEGATIVE
Urobilinogen, UA: 0.2

## 2011-04-10 LAB — LIPID PANEL
Cholesterol: 142
HDL: 66
LDL Cholesterol: 65
Total CHOL/HDL Ratio: 2.2

## 2011-04-10 LAB — POCT I-STAT, CHEM 8
BUN: 11
Creatinine, Ser: 1.4 — ABNORMAL HIGH
Hemoglobin: 12.2
Potassium: 4.3
Sodium: 128 — ABNORMAL LOW

## 2011-04-10 LAB — VITAMIN B12: Vitamin B-12: 547 (ref 211–911)

## 2011-04-10 LAB — CARDIAC PANEL(CRET KIN+CKTOT+MB+TROPI)
CK, MB: 2
CK, MB: 2
Relative Index: INVALID
Total CK: 41
Troponin I: 0.02

## 2011-04-10 LAB — OSMOLALITY, URINE: Osmolality, Ur: 152 — ABNORMAL LOW

## 2011-04-10 LAB — POCT CARDIAC MARKERS
CKMB, poc: 1.8
Myoglobin, poc: 102
Troponin i, poc: <0.05

## 2011-04-22 NOTE — Discharge Summary (Signed)
[name], [name] NO.:  [id number]  MEDICAL RECORD NO.:  [id number]  LOCATION:  1618                         FACILITY:  [facility]  PHYSICIAN:  Ollen Gross, M.D.    DATE OF BIRTH:  1928-04-20  DATE OF ADMISSION:  03/18/2011 DATE OF DISCHARGE:  03/25/2011                              DISCHARGE SUMMARY   ADMITTING DIAGNOSES: 1. Osteoarthritis, right knee. 2. Impaired hearing. 3. Early macular degeneration. 4. Hypertension. 5. Reflux disease. 6. Varicose veins. 7. Past history of phlebitis/deep venous thrombosis 30 years ago. 8. Childhood illnesses measles and mumps.  DISCHARGE DIAGNOSES: 1. Osteoarthritis, right knee status post right total knee replacement     and arthroplasty. 2. Postoperative acute blood loss, anemia. 3. Postoperative hyponatremia. 4. Postoperative hypokalemia. 5. Chronic dyspepsia, heme-negative stool. 6. Impaired hearing. 7. Early macular degeneration. 8. Hypertension. 9. Reflux disease. 10.Varicose veins. 11.Past history of phlebitis/deep venous thrombosis 30 years ago. 12.Childhood illnesses measles and mumps  PROCEDURE:  March 18, 2011, right total knee.  SURGEON:  Dr. Lequita Halt  ASSISTANT:  Alexzandrew L. Perkins, P.A.C.  CONSULTS:  Gastroenterology, Dr. Dulce Sellar.  BRIEF HISTORY:  The patient is an 77 year old female with severe end- stage arthritis of right knee, progressive worsening pain dysfunction, recurrent effusions, bone-on-bone long-term non operative management including rest, analgesics, and injections, now admitted for surgical intervention.  LABORATORY DATA:  Admission CBC not scanned in the chart.  Postop hemoglobin was down to 8.8 then dropped down to 7.1, given 2 units of blood back up to 8.2, drifted back down to 7.8, given further 2 units of blood, back up to 9.9 with a crit of 29.3.  Occult blood for stool was negative on March 21, 2011.  Chem panel on admission is not scanned in the chart.   Serial BMETs were followed for 6 days.  Sodium was a low; postop 132 drifted down to 130, back up to 131, potassium of 4.4-3.2, back up to 3.5, BUN and creatinine remained within normal limits.  Blood group type, O negative.  HOSPITAL COURSE:  The patient admitted [facility], taken to OR, underwent above stated procedure without complication.  The patient tolerated the procedure well and later transferred to recovery room, orthopedic floor, started on p.o. and IV analgesic, pain control following surgery, given 24 hours postop IV antibiotics.  Had some nausea and vomiting through the night, so we discontinued the morphine and switched over to Dilaudid pills.  She was placed on Xarelto for DVT prophylaxis.  Developed a low sodium so we were rechecking that, started back on her home meds.  By day 2, she is still a little bit better. Hemoglobin was 8.6, only after 2 units.  Dressing changed, incision looked good.  Sodium was stable at 132 and rechecked CBC to make sure her hemoglobin was stable.  By day 3, she was feeling tired and weak, and hemoglobin was down to 7.8.  Guaiac stools which were found to be negative.  She was seen back on day 4.  She was not ready to go due to being week and on day 4, she was reseen.  She had a bowel movement, so we can tried Fleet enema  and had to receive another transfusion because of the low anemia due to the issues.  On day 5, the hemoglobin had a drop down further.  So we ordered a GI consult to rule out any kind of other issue.  She was seen and found to have chronic dyspepsia but otherwise no other GI symptoms.  She was recommended to have an EGD, but the patient elected not to undergo the procedure.  Medications were added by Dr. Dulce Sellar who was covering for Dr. Matthias Hughs.  The patient felt like it was an issue with her stomach.  We held the Xarelto and rechecked her blood.  Her hemoglobin at that time was 8.1.  Her dressing was changed on day  2, and changed each day.  Incision was healing well, no signs of infection and on day 6, her hemoglobin was at 8.1.  By the following day, she was doing much better.  She had been seen by Dr. Dulce Sellar and felt that the hemoglobin will be stable, as long as she continued on her meds, and that she would follow up with Dr. Matthias Hughs on an outpatient basis.  We kept her one more day to ensure hemoglobin was stable.  She was seen by on March 25, 2011, on rounds.  She was doing better.  Her hemoglobin was back up to 9.9 and felt that the chronic dyspnea was under control and if she had a GI bleed, it appeared to be stable.  At that point, we decided to only put her on a coated baby aspirin and send her home at that time.  DISCHARGE PLAN: 1. The patient was discharged home on March 25, 2011. 2. Discharge diagnoses, please see above. 3. Discharge meds; Protonix, Carafate, Ultram, Nu-Iron, baby aspirin,     amlodipine, allopurinol, gabapentin, MiraLax, Ocuvite,     pantoprazole, ropinirole, and Sectral.  DIET:  Heart-healthy diet.  ACTIVITY:  Weightbearing as tolerated.  Total knee protocol.  FOLLOWUP:  2 weeks.  DISPOSITION:  Home.  CONDITION ON DISCHARGE:  Improved.  She will also follow up Dr. Matthias Hughs on an outpatient basis.     Alexzandrew L. Julien Girt, P.A.C.   ______________________________ Ollen Gross, M.D.    ALP/MEDQ  D:  04/09/2011  T:  04/09/2011  Job:  [id number]  cc:   Molly Maduro L. Foy Guadalajara, M.D. Fax: [phone]  Bernette Redbird, M.D. Fax: [phone]  Courtney Paris, M.D. Fax: [phone]  Romeo Rabon Fax: [phone]  Dr. Valente David  Electronically Signed by Patrica Duel P.A.C. on 04/13/2011 53:66:44 PM Electronically Signed by Ollen Gross M.D. on 04/22/2011 11:16:00 AM

## 2012-02-11 DIAGNOSIS — I499 Cardiac arrhythmia, unspecified: Secondary | ICD-10-CM

## 2012-02-11 HISTORY — DX: Cardiac arrhythmia, unspecified: I49.9

## 2012-03-07 ENCOUNTER — Other Ambulatory Visit: Payer: Self-pay | Admitting: Orthopedic Surgery

## 2012-03-07 MED ORDER — BUPIVACAINE 0.25 % ON-Q PUMP SINGLE CATH 300ML: 300.0000 mL | INJECTION | Status: DC

## 2012-03-07 MED ORDER — DEXAMETHASONE SODIUM PHOSPHATE 10 MG/ML IJ SOLN: 10.0000 mg | Freq: Once | INTRAMUSCULAR | Status: DC

## 2012-03-07 NOTE — Progress Notes (Signed)
Preoperative surgical orders have been place into the Epic hospital system for [name] on 03/07/2012, 8:40 AM  by Patrica Duel for surgery on 03/21/2012.  Preop Total Knee orders including Bupivacaine On-Q pump, IV Tylenol, and IV Decadron as long as there are no contraindications to the above medications. Avel Peace, PA-C

## 2012-03-15 ENCOUNTER — Other Ambulatory Visit (HOSPITAL_COMMUNITY): Payer: Self-pay | Admitting: Gastroenterology

## 2012-03-15 DIAGNOSIS — R1013 Epigastric pain: Secondary | ICD-10-CM

## 2012-03-21 ENCOUNTER — Encounter (HOSPITAL_COMMUNITY): Admission: RE | Payer: Self-pay | Source: Ambulatory Visit

## 2012-03-21 ENCOUNTER — Ambulatory Visit (HOSPITAL_COMMUNITY): Admission: RE | Admit: 2012-03-21 | Payer: Medicare Other | Source: Ambulatory Visit | Admitting: Orthopedic Surgery

## 2012-03-21 SURGERY — ARTHROPLASTY, KNEE, TOTAL
Anesthesia: Choice | Laterality: Left

## 2012-03-24 ENCOUNTER — Encounter (HOSPITAL_COMMUNITY)
Admission: RE | Admit: 2012-03-24 | Discharge: 2012-03-24 | Disposition: A | Discharge status: Home / Self Care | Payer: Medicare Other | Source: Ambulatory Visit | Attending: Gastroenterology | Admitting: Gastroenterology

## 2012-03-24 DIAGNOSIS — R1013 Epigastric pain: Secondary | ICD-10-CM | POA: Insufficient documentation

## 2012-03-24 DIAGNOSIS — K3189 Other diseases of stomach and duodenum: Secondary | ICD-10-CM | POA: Insufficient documentation

## 2012-03-24 MED ORDER — TECHNETIUM TC 99M SULFUR COLLOID
2.0000 | Freq: Once | INTRAVENOUS | Status: AC | PRN
  Administered 2012-03-24: 2 via ORAL

## 2012-10-19 ENCOUNTER — Encounter (HOSPITAL_COMMUNITY): Payer: Self-pay | Admitting: Pharmacy Technician

## 2012-10-20 ENCOUNTER — Other Ambulatory Visit: Payer: Self-pay | Admitting: Orthopedic Surgery

## 2012-10-20 NOTE — Progress Notes (Signed)
Preoperative surgical orders have been place into the Epic hospital system for [name] on 10/20/2012, 5:46 PM  by Patrica Duel for surgery on 11/07/2012.  Preop Total Knee orders including Experal, IV Tylenol, and IV Decadron as long as there are no contraindications to the above medications. Avel Peace, PA-C

## 2012-10-20 NOTE — Progress Notes (Signed)
Surgery scheduled for 11/07/12.  Preop appointment on 10/25/12 at 100pm.  Need orders in EPIC.  Thanks.

## 2012-10-25 ENCOUNTER — Ambulatory Visit (HOSPITAL_COMMUNITY)
Admission: RE | Admit: 2012-10-25 | Discharge: 2012-10-25 | Disposition: A | Discharge status: Home / Self Care | Payer: Medicare Other | Source: Ambulatory Visit | Attending: Orthopedic Surgery | Admitting: Orthopedic Surgery

## 2012-10-25 ENCOUNTER — Encounter (HOSPITAL_COMMUNITY): Payer: Self-pay

## 2012-10-25 ENCOUNTER — Encounter (HOSPITAL_COMMUNITY)
Admission: RE | Admit: 2012-10-25 | Discharge: 2012-10-25 | Disposition: A | Discharge status: Home / Self Care | Payer: Medicare Other | Source: Ambulatory Visit | Attending: Orthopedic Surgery | Admitting: Orthopedic Surgery

## 2012-10-25 DIAGNOSIS — M1710 Unilateral primary osteoarthritis, unspecified knee: Secondary | ICD-10-CM | POA: Insufficient documentation

## 2012-10-25 DIAGNOSIS — J4489 Other specified chronic obstructive pulmonary disease: Secondary | ICD-10-CM | POA: Insufficient documentation

## 2012-10-25 DIAGNOSIS — I10 Essential (primary) hypertension: Secondary | ICD-10-CM | POA: Insufficient documentation

## 2012-10-25 DIAGNOSIS — J449 Chronic obstructive pulmonary disease, unspecified: Secondary | ICD-10-CM | POA: Insufficient documentation

## 2012-10-25 DIAGNOSIS — Z01812 Encounter for preprocedural laboratory examination: Secondary | ICD-10-CM | POA: Insufficient documentation

## 2012-10-25 DIAGNOSIS — I771 Stricture of artery: Secondary | ICD-10-CM | POA: Insufficient documentation

## 2012-10-25 HISTORY — DX: Peripheral vascular disease, unspecified: I73.9

## 2012-10-25 HISTORY — DX: Major depressive disorder, single episode, unspecified: F32.9

## 2012-10-25 HISTORY — DX: Restless legs syndrome: G25.81

## 2012-10-25 HISTORY — DX: Cardiac arrhythmia, unspecified: I49.9

## 2012-10-25 HISTORY — DX: Essential (primary) hypertension: I10

## 2012-10-25 HISTORY — DX: Personal history of other medical treatment: Z92.89

## 2012-10-25 HISTORY — DX: Gastro-esophageal reflux disease without esophagitis: K21.9

## 2012-10-25 HISTORY — DX: Chronic kidney disease, unspecified: N18.9

## 2012-10-25 HISTORY — DX: Depression, unspecified: F32.A

## 2012-10-25 HISTORY — DX: Unspecified osteoarthritis, unspecified site: M19.90

## 2012-10-25 LAB — COMPREHENSIVE METABOLIC PANEL
ALT: 13 U/L (ref 0–35)
Alkaline Phosphatase: 89 U/L (ref 39–117)
CO2: 29 mEq/L (ref 19–32)
Calcium: 9.8 mg/dL (ref 8.4–10.5)
GFR calc Af Amer: 57 mL/min — ABNORMAL LOW (ref >90)
GFR calc non Af Amer: 49 mL/min — ABNORMAL LOW (ref >90)
Glucose, Bld: 96 mg/dL (ref 70–99)
Sodium: 136 mEq/L (ref 135–145)

## 2012-10-25 LAB — CBC
HCT: 42.7 % (ref 36.0–46.0)
Hemoglobin: 14.6 g/dL (ref 12.0–15.0)
MCH: 29.9 pg (ref 26.0–34.0)
RBC: 4.88 MIL/uL (ref 3.87–5.11)

## 2012-10-25 LAB — URINALYSIS, ROUTINE W REFLEX MICROSCOPIC
Glucose, UA: NEGATIVE mg/dL
Hgb urine dipstick: NEGATIVE
Ketones, ur: NEGATIVE mg/dL
Protein, ur: NEGATIVE mg/dL

## 2012-10-25 NOTE — Progress Notes (Signed)
EKG 8/13 chart, eccho 9/13, loop recorder results 11/13-12/13, stress test report 12/13, OV with CLEARANCE Dr Sung Amabile, ALL ON CHART.  OV with clearance and note Dr Foy Guadalajara 3/14 on chart

## 2012-10-25 NOTE — Patient Instructions (Addendum)
20 [name]  10/25/2012   Your procedure is scheduled on:  11/07/12  MONDAY  Report to [facility] at [phone]      AM.  Call this number if you have problems the morning of surgery: [phone]       Remember:   Do not eat food  Or drink :After Midnight.SUNDAY NIGHT   Take these medicines the morning of surgery with A SIP OF WATER:   ACEBUTOLOL, Norvasc, Zyrtec, Protonix   .  Contacts, dentures or partial plates can not be worn to surgery  Leave suitcase in the car. After surgery it may be brought to your room.  For patients admitted to the hospital, checkout time is 11:00 AM day of  discharge.             SPECIAL INSTRUCTIONS- SEE [place] PREPARING FOR SURGERY INSTRUCTION SHEET-     DO NOT WEAR JEWELRY, LOTIONS, POWDERS, OR PERFUMES.  WOMEN-- DO NOT SHAVE LEGS OR UNDERARMS FOR 12 HOURS BEFORE SHOWERS. MEN MAY SHAVE FACE.  Patients discharged the day of surgery will not be allowed to drive home. IF going home the day of surgery, you must have a driver and someone to stay with you for the first 24 hours  Name and phone number of your driver:      admission                                                                  Please read over the following fact sheets that you were given: MRSA Information, Incentive Spirometry Sheet, Blood Transfusion Sheet  Information                                                                                   [name]  PST 336  [phone]                 FAILURE TO FOLLOW THESE INSTRUCTIONS MAY RESULT IN  CANCELLATION   OF YOUR SURGERY                                                  Patient Signature _____________________________

## 2012-10-26 ENCOUNTER — Encounter (HOSPITAL_COMMUNITY): Payer: Self-pay

## 2012-10-26 NOTE — Progress Notes (Signed)
PCN causes HIVES, ITCHING-- ANCEF ORDERED PRE OP-  PLEASE CLARIFY IN EPIC IF ORDER NEEDS TO BE CHANGED.  THANK YOU-  SURGERY 11/07/12

## 2012-10-27 NOTE — Progress Notes (Signed)
The patient has a PCN allergy but it is hives and itching, no anaphylaxis documented.  May do ANCEF for preop ABX. Avel Peace, [facility]

## 2012-11-06 ENCOUNTER — Other Ambulatory Visit: Payer: Self-pay | Admitting: Surgical

## 2012-11-06 NOTE — H&P (Signed)
TOTAL KNEE ADMISSION H&P  Patient is being admitted for left total knee arthroplasty.  Subjective:  Chief Complaint:left knee pain.  HPI: [name], 79 y.o. female, has a history of pain and functional disability in the left knee due to arthritis and has failed non-surgical conservative treatments for greater than 12 weeks to includeNSAID's and/or analgesics, corticosteriod injections, viscosupplementation injections and activity modification.  Onset of symptoms was gradual, starting 5 years ago with gradually worsening course since that time. The patient noted no past surgery on the left knee(s).  Patient currently rates pain in the left knee(s) at 6 out of 10 with activity. Patient has night pain, worsening of pain with activity and weight bearing, pain that interferes with activities of daily living, pain with passive range of motion, crepitus and joint swelling.  Patient has evidence of subchondral cysts, periarticular osteophytes and joint space narrowing by imaging studies.  There is no active infection.  Patient Active Problem List   Diagnosis Date Noted  . EUSTACHIAN TUBE DYSFUNCTION 05/30/2007  . ALLERGIC RHINITIS 05/30/2007   Past Medical History  Diagnosis Date  . Hypertension   . Dysrhythmia 8/13    palpitations/ now resolved  . Depression   . GERD (gastroesophageal reflux disease)   . Restless legs syndrome   . Arthritis   . History of blood transfusion 2012  . Peripheral vascular disease     varicose veins bilaterally   . Chronic kidney disease     Past Surgical History  Procedure Laterality Date  . Joint replacement Right     knee  . Abdominal hysterectomy    . Eye surgery Bilateral     cataract extraction with IOL  . Appendectomy    . Varicose vein surgery Bilateral   . Septoplasty      repair deviated septum     Current outpatient prescriptions:acebutolol (SECTRAL) 400 MG capsule, Take 400 mg by mouth daily after breakfast. , Disp: , Rfl: ;   acetaminophen (TYLENOL) 500 MG tablet, Take 500 mg by mouth every 6 (six) hours as needed for pain., Disp: , Rfl: ;  amLODipine (NORVASC) 5 MG tablet, Take 5 mg by mouth daily after breakfast. , Disp: , Rfl: ;  busPIRone (BUSPAR) 5 MG tablet, Take 5 mg by mouth 2 (two) times daily., Disp: , Rfl:  cetirizine (ZYRTEC) 10 MG tablet, Take 10 mg by mouth daily., Disp: , Rfl: ;  cyclobenzaprine (FLEXERIL) 10 MG tablet, Take 10 mg by mouth at bedtime as needed for muscle spasms., Disp: , Rfl: ;  estradiol (ESTRACE) 1 MG tablet, Take 1 mg by mouth daily., Disp: , Rfl: ;  Omeprazole-Sodium Bicarbonate (ZEGERID) 20-1100 MG CAPS, Take 1 capsule by mouth at bedtime. , Disp: , Rfl:  pantoprazole (PROTONIX) 40 MG tablet, Take 40 mg by mouth daily., Disp: , Rfl: ;  ropinirole (REQUIP) 5 MG tablet, Take 10 mg by mouth at bedtime. , Disp: , Rfl:   Allergies  Allergen Reactions  . Penicillins Hives and Itching  . Aspirin Palpitations    In high doses    History  Substance Use Topics  . Smoking status: Never Smoker   . Smokeless tobacco: Never Used  . Alcohol Use: No    Family History Father deceased age 13 due to MI Mother deceased age 39 due to pulmonary disease  Review of Systems  Constitutional: Negative.   HENT: Negative.  Negative for neck pain.   Eyes: Negative.   Cardiovascular: Positive for orthopnea. Negative for chest  pain, palpitations, claudication, leg swelling and PND.  Gastrointestinal: Negative.   Genitourinary: Negative.   Musculoskeletal: Positive for joint pain. Negative for myalgias, back pain and falls.       Left knee pain  Skin: Negative.   Neurological: Negative.   Endo/Heme/Allergies: Negative.   Psychiatric/Behavioral: Negative.     Objective:  Physical Exam  Constitutional: She is oriented to person, place, and time. She appears well-developed and well-nourished. No distress.  HENT:  Head: Normocephalic and atraumatic.  Right Ear: External ear normal.  Left Ear:  External ear normal.  Nose: Nose normal.  Mouth/Throat: Oropharynx is clear and moist.  Eyes: Conjunctivae and EOM are normal.  Neck: Normal range of motion. Neck supple. No tracheal deviation present. No thyromegaly present.  Cardiovascular: Normal rate, regular rhythm and intact distal pulses.   Murmur heard. Respiratory: Effort normal and breath sounds normal. No respiratory distress. She has no wheezes. She exhibits no tenderness.  GI: Soft. Bowel sounds are normal. She exhibits no distension and no mass. There is no tenderness.  Musculoskeletal:       Right hip: Normal.       Left hip: Normal.       Right knee: She exhibits normal range of motion, no effusion and no erythema. No tenderness found.       Left knee: She exhibits decreased range of motion and swelling. She exhibits no erythema. Tenderness found. Medial joint line and lateral joint line tenderness noted.       Right lower leg: She exhibits no tenderness and no swelling.       Left lower leg: She exhibits no tenderness and no swelling.       Legs: Lymphadenopathy:    She has no cervical adenopathy.  Neurological: She is alert and oriented to person, place, and time. She has normal strength and normal reflexes. No sensory deficit.  Skin: No rash noted. She is not diaphoretic. No erythema.  Psychiatric: She has a normal mood and affect. Her behavior is normal.   Vitals Weight: 154 lb Height: 67 in Body Surface Area: 1.82 m Body Mass Index: 24.12 kg/m Pulse: 69 (Regular) BP: 145/88 (Sitting, Left Arm, Standard)  Imaging Review Plain radiographs demonstrate severe degenerative joint disease of the left knee(s). The overall alignment ismild varus. The bone quality appears to be good for age and reported activity level.  Assessment/Plan:  End stage arthritis, left knee   The patient history, physical examination, clinical judgment of the provider and imaging studies are consistent with end stage degenerative  joint disease of the left knee(s) and total knee arthroplasty is deemed medically necessary. The treatment options including medical management, injection therapy arthroscopy and arthroplasty were discussed at length. The risks and benefits of total knee arthroplasty were presented and reviewed. The risks due to aseptic loosening, infection, stiffness, patella tracking problems, thromboembolic complications and other imponderables were discussed. The patient acknowledged the explanation, agreed to proceed with the plan and consent was signed. Patient is being admitted for inpatient treatment for surgery, pain control, PT, OT, prophylactic antibiotics, VTE prophylaxis, progressive ambulation and ADL's and discharge planning. The patient is planning to be discharged home with home health services    [place], [place]

## 2012-11-07 ENCOUNTER — Encounter (HOSPITAL_COMMUNITY): Payer: Self-pay | Admitting: Anesthesiology

## 2012-11-07 ENCOUNTER — Encounter (HOSPITAL_COMMUNITY)
Admission: RE | Disposition: A | Discharge status: Home Health | Payer: Self-pay | Source: Ambulatory Visit | Attending: Orthopedic Surgery

## 2012-11-07 ENCOUNTER — Encounter (HOSPITAL_COMMUNITY): Payer: Self-pay

## 2012-11-07 ENCOUNTER — Inpatient Hospital Stay (HOSPITAL_COMMUNITY): Payer: Medicare Other | Admitting: Anesthesiology

## 2012-11-07 ENCOUNTER — Inpatient Hospital Stay (HOSPITAL_COMMUNITY)
Admission: RE | Admit: 2012-11-07 | Discharge: 2012-11-09 | DRG: 470 | Disposition: A | Discharge status: Home Health | Payer: Medicare Other | Source: Ambulatory Visit | Attending: Orthopedic Surgery | Admitting: Orthopedic Surgery

## 2012-11-07 DIAGNOSIS — I739 Peripheral vascular disease, unspecified: Secondary | ICD-10-CM | POA: Diagnosis present

## 2012-11-07 DIAGNOSIS — E871 Hypo-osmolality and hyponatremia: Secondary | ICD-10-CM | POA: Diagnosis not present

## 2012-11-07 DIAGNOSIS — K219 Gastro-esophageal reflux disease without esophagitis: Secondary | ICD-10-CM | POA: Diagnosis present

## 2012-11-07 DIAGNOSIS — G2581 Restless legs syndrome: Secondary | ICD-10-CM | POA: Diagnosis present

## 2012-11-07 DIAGNOSIS — F3289 Other specified depressive episodes: Secondary | ICD-10-CM | POA: Diagnosis present

## 2012-11-07 DIAGNOSIS — Z880 Allergy status to penicillin: Secondary | ICD-10-CM

## 2012-11-07 DIAGNOSIS — M179 Osteoarthritis of knee, unspecified: Secondary | ICD-10-CM | POA: Diagnosis present

## 2012-11-07 DIAGNOSIS — Z79899 Other long term (current) drug therapy: Secondary | ICD-10-CM

## 2012-11-07 DIAGNOSIS — Z96652 Presence of left artificial knee joint: Secondary | ICD-10-CM

## 2012-11-07 DIAGNOSIS — M1710 Unilateral primary osteoarthritis, unspecified knee: Principal | ICD-10-CM | POA: Diagnosis present

## 2012-11-07 DIAGNOSIS — I10 Essential (primary) hypertension: Secondary | ICD-10-CM | POA: Diagnosis present

## 2012-11-07 DIAGNOSIS — F329 Major depressive disorder, single episode, unspecified: Secondary | ICD-10-CM | POA: Diagnosis present

## 2012-11-07 HISTORY — PX: TOTAL KNEE ARTHROPLASTY: SHX125

## 2012-11-07 LAB — TYPE AND SCREEN: Antibody Screen: NEGATIVE

## 2012-11-07 SURGERY — ARTHROPLASTY, KNEE, TOTAL
Anesthesia: General | Site: Knee | Laterality: Left | Wound class: Clean

## 2012-11-07 MED ORDER — ACETAMINOPHEN 650 MG RE SUPP: 650.0000 mg | Freq: Four times a day (QID) | RECTAL | Status: DC | PRN

## 2012-11-07 MED ORDER — CYCLOBENZAPRINE HCL 10 MG PO TABS: 10.0000 mg | ORAL_TABLET | Freq: Every evening | ORAL | Status: DC | PRN

## 2012-11-07 MED ORDER — TRANEXAMIC ACID 100 MG/ML IV SOLN
1000.0000 mg | INTRAVENOUS | Status: AC
  Administered 2012-11-07: 1000 mg via INTRAVENOUS
  Filled 2012-11-07: qty 10

## 2012-11-07 MED ORDER — ONDANSETRON HCL 4 MG/2ML IJ SOLN
INTRAMUSCULAR | Status: DC | PRN
  Administered 2012-11-07: 4 mg via INTRAVENOUS

## 2012-11-07 MED ORDER — MORPHINE SULFATE 10 MG/ML IJ SOLN
1.0000 mg | INTRAMUSCULAR | Status: DC | PRN
  Administered 2012-11-07 (×4): 2 mg via INTRAVENOUS

## 2012-11-07 MED ORDER — DEXAMETHASONE SODIUM PHOSPHATE 10 MG/ML IJ SOLN
INTRAMUSCULAR | Status: DC | PRN
  Administered 2012-11-07: 10 mg via INTRAVENOUS

## 2012-11-07 MED ORDER — LACTATED RINGERS IV SOLN
INTRAVENOUS | Status: DC | PRN
  Administered 2012-11-07: 09:00:00 via INTRAVENOUS

## 2012-11-07 MED ORDER — DEXAMETHASONE SODIUM PHOSPHATE 10 MG/ML IJ SOLN
10.0000 mg | Freq: Every day | INTRAMUSCULAR | Status: AC
  Filled 2012-11-07: qty 1

## 2012-11-07 MED ORDER — ROPINIROLE HCL 1 MG PO TABS
10.0000 mg | ORAL_TABLET | Freq: Every day | ORAL | Status: DC
  Administered 2012-11-07 – 2012-11-08 (×2): 10 mg via ORAL
  Filled 2012-11-07 (×3): qty 10

## 2012-11-07 MED ORDER — DIPHENHYDRAMINE HCL 12.5 MG/5ML PO ELIX: 12.5000 mg | ORAL_SOLUTION | ORAL | Status: DC | PRN

## 2012-11-07 MED ORDER — SODIUM CHLORIDE 0.9 % IJ SOLN
INTRAMUSCULAR | Status: DC | PRN
  Administered 2012-11-07: 10:00:00

## 2012-11-07 MED ORDER — DOCUSATE SODIUM 100 MG PO CAPS
100.0000 mg | ORAL_CAPSULE | Freq: Two times a day (BID) | ORAL | Status: DC
  Administered 2012-11-07 – 2012-11-09 (×4): 100 mg via ORAL

## 2012-11-07 MED ORDER — BUPIVACAINE LIPOSOME 1.3 % IJ SUSP
20.0000 mL | Freq: Once | INTRAMUSCULAR | Status: DC
  Filled 2012-11-07: qty 20

## 2012-11-07 MED ORDER — BUSPIRONE HCL 5 MG PO TABS
5.0000 mg | ORAL_TABLET | Freq: Two times a day (BID) | ORAL | Status: DC
  Administered 2012-11-07 – 2012-11-09 (×4): 5 mg via ORAL
  Filled 2012-11-07 (×6): qty 1

## 2012-11-07 MED ORDER — ROCURONIUM BROMIDE 100 MG/10ML IV SOLN
INTRAVENOUS | Status: DC | PRN
  Administered 2012-11-07: 30 mg via INTRAVENOUS

## 2012-11-07 MED ORDER — PANTOPRAZOLE SODIUM 40 MG PO TBEC: 40.0000 mg | DELAYED_RELEASE_TABLET | Freq: Every day | ORAL | Status: DC

## 2012-11-07 MED ORDER — LIDOCAINE HCL (CARDIAC) 20 MG/ML IV SOLN
INTRAVENOUS | Status: DC | PRN
  Administered 2012-11-07: 100 mg via INTRAVENOUS

## 2012-11-07 MED ORDER — CYCLOBENZAPRINE HCL 10 MG PO TABS
10.0000 mg | ORAL_TABLET | Freq: Three times a day (TID) | ORAL | Status: DC | PRN
  Administered 2012-11-07: 10 mg via ORAL
  Filled 2012-11-07: qty 1

## 2012-11-07 MED ORDER — LACTATED RINGERS IV SOLN: INTRAVENOUS | Status: DC

## 2012-11-07 MED ORDER — NEOSTIGMINE METHYLSULFATE 1 MG/ML IJ SOLN
INTRAMUSCULAR | Status: DC | PRN
  Administered 2012-11-07: 4 mg via INTRAVENOUS

## 2012-11-07 MED ORDER — ACETAMINOPHEN 10 MG/ML IV SOLN
INTRAVENOUS | Status: DC | PRN
  Administered 2012-11-07: 1000 mg via INTRAVENOUS

## 2012-11-07 MED ORDER — FLEET ENEMA 7-19 GM/118ML RE ENEM: 1.0000 | ENEMA | Freq: Once | RECTAL | Status: AC | PRN

## 2012-11-07 MED ORDER — MENTHOL 3 MG MT LOZG: 1.0000 | LOZENGE | OROMUCOSAL | Status: DC | PRN

## 2012-11-07 MED ORDER — 0.9 % SODIUM CHLORIDE (POUR BTL) OPTIME
TOPICAL | Status: DC | PRN
  Administered 2012-11-07: 1000 mL

## 2012-11-07 MED ORDER — EPHEDRINE SULFATE 50 MG/ML IJ SOLN
INTRAMUSCULAR | Status: DC | PRN
  Administered 2012-11-07 (×3): 10 mg via INTRAVENOUS

## 2012-11-07 MED ORDER — POLYETHYLENE GLYCOL 3350 17 G PO PACK: 17.0000 g | PACK | Freq: Every day | ORAL | Status: DC | PRN

## 2012-11-07 MED ORDER — CEFAZOLIN SODIUM-DEXTROSE 2-3 GM-% IV SOLR
2.0000 g | INTRAVENOUS | Status: AC
  Administered 2012-11-07: 2 g via INTRAVENOUS

## 2012-11-07 MED ORDER — ACETAMINOPHEN 10 MG/ML IV SOLN
1000.0000 mg | Freq: Four times a day (QID) | INTRAVENOUS | Status: AC
  Administered 2012-11-07 – 2012-11-08 (×4): 1000 mg via INTRAVENOUS
  Filled 2012-11-07 (×6): qty 100

## 2012-11-07 MED ORDER — FENTANYL CITRATE 0.05 MG/ML IJ SOLN
INTRAMUSCULAR | Status: DC | PRN
  Administered 2012-11-07 (×5): 50 ug via INTRAVENOUS

## 2012-11-07 MED ORDER — BISACODYL 10 MG RE SUPP: 10.0000 mg | Freq: Every day | RECTAL | Status: DC | PRN

## 2012-11-07 MED ORDER — ONDANSETRON HCL 4 MG/2ML IJ SOLN
4.0000 mg | Freq: Four times a day (QID) | INTRAMUSCULAR | Status: DC | PRN
  Administered 2012-11-07 – 2012-11-09 (×4): 4 mg via INTRAVENOUS
  Filled 2012-11-07 (×4): qty 2

## 2012-11-07 MED ORDER — OXYCODONE HCL 5 MG PO TABS
5.0000 mg | ORAL_TABLET | ORAL | Status: DC | PRN
  Administered 2012-11-07: 10 mg via ORAL
  Filled 2012-11-07: qty 2

## 2012-11-07 MED ORDER — SODIUM CHLORIDE 0.9 % IR SOLN
Status: DC | PRN
  Administered 2012-11-07: 1000 mL

## 2012-11-07 MED ORDER — CEFAZOLIN SODIUM 1-5 GM-% IV SOLN
1.0000 g | Freq: Four times a day (QID) | INTRAVENOUS | Status: AC
  Administered 2012-11-07 – 2012-11-08 (×2): 1 g via INTRAVENOUS
  Filled 2012-11-07 (×2): qty 50

## 2012-11-07 MED ORDER — MORPHINE SULFATE 2 MG/ML IJ SOLN: 1.0000 mg | INTRAMUSCULAR | Status: DC | PRN

## 2012-11-07 MED ORDER — PANTOPRAZOLE SODIUM 40 MG PO TBEC
40.0000 mg | DELAYED_RELEASE_TABLET | Freq: Every day | ORAL | Status: DC
  Administered 2012-11-07 – 2012-11-08 (×2): 40 mg via ORAL
  Filled 2012-11-07 (×3): qty 1

## 2012-11-07 MED ORDER — ACEBUTOLOL HCL 400 MG PO CAPS
400.0000 mg | ORAL_CAPSULE | Freq: Every day | ORAL | Status: DC
  Administered 2012-11-07 – 2012-11-09 (×3): 400 mg via ORAL
  Filled 2012-11-07 (×3): qty 1

## 2012-11-07 MED ORDER — BUPIVACAINE HCL (PF) 0.25 % IJ SOLN
INTRAMUSCULAR | Status: DC | PRN
  Administered 2012-11-07: 20 mL

## 2012-11-07 MED ORDER — METOCLOPRAMIDE HCL 10 MG PO TABS: 5.0000 mg | ORAL_TABLET | Freq: Three times a day (TID) | ORAL | Status: DC | PRN

## 2012-11-07 MED ORDER — SUCCINYLCHOLINE CHLORIDE 20 MG/ML IJ SOLN
INTRAMUSCULAR | Status: DC | PRN
  Administered 2012-11-07: 100 mg via INTRAVENOUS

## 2012-11-07 MED ORDER — TRAMADOL HCL 50 MG PO TABS
50.0000 mg | ORAL_TABLET | Freq: Four times a day (QID) | ORAL | Status: DC | PRN
  Administered 2012-11-08: 50 mg via ORAL
  Administered 2012-11-08: 100 mg via ORAL
  Administered 2012-11-08: 50 mg via ORAL
  Administered 2012-11-09 (×2): 100 mg via ORAL
  Filled 2012-11-07 (×2): qty 2
  Filled 2012-11-07: qty 1
  Filled 2012-11-07: qty 2
  Filled 2012-11-07: qty 1

## 2012-11-07 MED ORDER — PHENOL 1.4 % MT LIQD: 1.0000 | OROMUCOSAL | Status: DC | PRN

## 2012-11-07 MED ORDER — ONDANSETRON HCL 4 MG PO TABS
4.0000 mg | ORAL_TABLET | Freq: Four times a day (QID) | ORAL | Status: DC | PRN
  Administered 2012-11-08: 4 mg via ORAL
  Filled 2012-11-07 (×3): qty 1

## 2012-11-07 MED ORDER — PROPOFOL 10 MG/ML IV BOLUS
INTRAVENOUS | Status: DC | PRN
  Administered 2012-11-07: 150 mg via INTRAVENOUS

## 2012-11-07 MED ORDER — LORATADINE 10 MG PO TABS
10.0000 mg | ORAL_TABLET | Freq: Every day | ORAL | Status: DC
  Administered 2012-11-07 – 2012-11-09 (×3): 10 mg via ORAL
  Filled 2012-11-07 (×3): qty 1

## 2012-11-07 MED ORDER — STERILE WATER FOR IRRIGATION IR SOLN
Status: DC | PRN
  Administered 2012-11-07: 3000 mL

## 2012-11-07 MED ORDER — SODIUM CHLORIDE 0.9 % IV SOLN: INTRAVENOUS | Status: DC

## 2012-11-07 MED ORDER — RIVAROXABAN 10 MG PO TABS
10.0000 mg | ORAL_TABLET | Freq: Every day | ORAL | Status: DC
  Administered 2012-11-08 – 2012-11-09 (×2): 10 mg via ORAL
  Filled 2012-11-07 (×3): qty 1

## 2012-11-07 MED ORDER — GLYCOPYRROLATE 0.2 MG/ML IJ SOLN
INTRAMUSCULAR | Status: DC | PRN
  Administered 2012-11-07: 0.6 mg via INTRAVENOUS

## 2012-11-07 MED ORDER — METOCLOPRAMIDE HCL 5 MG/ML IJ SOLN
5.0000 mg | Freq: Three times a day (TID) | INTRAMUSCULAR | Status: DC | PRN
  Administered 2012-11-07: 10 mg via INTRAVENOUS
  Filled 2012-11-07: qty 2

## 2012-11-07 MED ORDER — PROMETHAZINE HCL 25 MG/ML IJ SOLN: 6.2500 mg | INTRAMUSCULAR | Status: DC | PRN

## 2012-11-07 MED ORDER — ACETAMINOPHEN 325 MG PO TABS: 650.0000 mg | ORAL_TABLET | Freq: Four times a day (QID) | ORAL | Status: DC | PRN

## 2012-11-07 MED ORDER — AMLODIPINE BESYLATE 5 MG PO TABS
5.0000 mg | ORAL_TABLET | Freq: Every day | ORAL | Status: DC
  Administered 2012-11-08 – 2012-11-09 (×2): 5 mg via ORAL
  Filled 2012-11-07 (×2): qty 1

## 2012-11-07 MED ORDER — DEXAMETHASONE 6 MG PO TABS
10.0000 mg | ORAL_TABLET | Freq: Every day | ORAL | Status: AC
  Administered 2012-11-08: 10 mg via ORAL
  Filled 2012-11-07: qty 1

## 2012-11-07 MED ORDER — DEXTROSE-NACL 5-0.9 % IV SOLN
INTRAVENOUS | Status: DC
  Administered 2012-11-07 (×2): via INTRAVENOUS

## 2012-11-07 SURGICAL SUPPLY — 54 items
BAG SPEC THK2 15X12 ZIP CLS (MISCELLANEOUS) ×1
BAG ZIPLOCK 12X15 (MISCELLANEOUS) ×2 IMPLANT
BANDAGE ELASTIC 6 VELCRO ST LF (GAUZE/BANDAGES/DRESSINGS) ×2 IMPLANT
BANDAGE ESMARK 6X9 LF (GAUZE/BANDAGES/DRESSINGS) ×1 IMPLANT
BLADE SAG 18X100X1.27 (BLADE) ×2 IMPLANT
BLADE SAW SGTL 11.0X1.19X90.0M (BLADE) ×2 IMPLANT
BNDG CMPR 9X6 STRL LF SNTH (GAUZE/BANDAGES/DRESSINGS) ×1
BNDG ESMARK 6X9 LF (GAUZE/BANDAGES/DRESSINGS) ×2
BOWL SMART MIX CTS (DISPOSABLE) ×2 IMPLANT
CEMENT HV SMART SET (Cement) ×4 IMPLANT
CLOTH BEACON ORANGE TIMEOUT ST (SAFETY) ×2 IMPLANT
CUFF TOURN SGL QUICK 34 (TOURNIQUET CUFF) ×2
CUFF TRNQT CYL 34X4X40X1 (TOURNIQUET CUFF) ×1 IMPLANT
DRAPE EXTREMITY T 121X128X90 (DRAPE) ×2 IMPLANT
DRAPE POUCH INSTRU U-SHP 10X18 (DRAPES) ×2 IMPLANT
DRAPE U-SHAPE 47X51 STRL (DRAPES) ×2 IMPLANT
DRSG ADAPTIC 3X8 NADH LF (GAUZE/BANDAGES/DRESSINGS) ×2 IMPLANT
DURAPREP 26ML APPLICATOR (WOUND CARE) ×2 IMPLANT
ELECT REM PT RETURN 9FT ADLT (ELECTROSURGICAL) ×2
ELECTRODE REM PT RTRN 9FT ADLT (ELECTROSURGICAL) ×1 IMPLANT
EVACUATOR 1/8 PVC DRAIN (DRAIN) ×2 IMPLANT
FACESHIELD LNG OPTICON STERILE (SAFETY) ×10 IMPLANT
GLOVE BIO SURGEON STRL SZ7.5 (GLOVE) ×2 IMPLANT
GLOVE BIO SURGEON STRL SZ8 (GLOVE) ×2 IMPLANT
GLOVE BIOGEL PI IND STRL 8 (GLOVE) ×2 IMPLANT
GLOVE BIOGEL PI INDICATOR 8 (GLOVE) ×2
GLOVE SURG SS PI 6.5 STRL IVOR (GLOVE) ×4 IMPLANT
GOWN STRL NON-REIN LRG LVL3 (GOWN DISPOSABLE) ×4 IMPLANT
GOWN STRL REIN XL XLG (GOWN DISPOSABLE) ×2 IMPLANT
HANDPIECE INTERPULSE COAX TIP (DISPOSABLE) ×2
IMMOBILIZER KNEE 20 (SOFTGOODS) ×2
IMMOBILIZER KNEE 20 THIGH 36 (SOFTGOODS) ×1 IMPLANT
KIT BASIN OR (CUSTOM PROCEDURE TRAY) ×2 IMPLANT
MANIFOLD NEPTUNE II (INSTRUMENTS) ×2 IMPLANT
NDL SAFETY ECLIPSE 18X1.5 (NEEDLE) ×1 IMPLANT
NEEDLE HYPO 18GX1.5 SHARP (NEEDLE) ×2
NS IRRIG 1000ML POUR BTL (IV SOLUTION) ×2 IMPLANT
PACK TOTAL JOINT (CUSTOM PROCEDURE TRAY) ×2 IMPLANT
PAD ABD 7.5X8 STRL (GAUZE/BANDAGES/DRESSINGS) ×2 IMPLANT
PADDING CAST COTTON 6X4 STRL (CAST SUPPLIES) ×4 IMPLANT
POSITIONER SURGICAL ARM (MISCELLANEOUS) ×2 IMPLANT
SET HNDPC FAN SPRY TIP SCT (DISPOSABLE) ×1 IMPLANT
SPONGE GAUZE 4X4 12PLY (GAUZE/BANDAGES/DRESSINGS) ×2 IMPLANT
STRIP CLOSURE SKIN 1/2X4 (GAUZE/BANDAGES/DRESSINGS) ×3 IMPLANT
SUCTION FRAZIER 12FR DISP (SUCTIONS) ×2 IMPLANT
SUT MNCRL AB 4-0 PS2 18 (SUTURE) ×2 IMPLANT
SUT VIC AB 2-0 CT1 27 (SUTURE) ×6
SUT VIC AB 2-0 CT1 TAPERPNT 27 (SUTURE) ×3 IMPLANT
SUT VLOC 180 0 24IN GS25 (SUTURE) ×2 IMPLANT
SYR 50ML LL SCALE MARK (SYRINGE) ×2 IMPLANT
TOWEL OR 17X26 10 PK STRL BLUE (TOWEL DISPOSABLE) ×4 IMPLANT
TRAY FOLEY CATH 14FRSI W/METER (CATHETERS) ×2 IMPLANT
WATER STERILE IRR 1500ML POUR (IV SOLUTION) ×3 IMPLANT
WRAP KNEE MAXI GEL POST OP (GAUZE/BANDAGES/DRESSINGS) ×3 IMPLANT

## 2012-11-07 NOTE — Progress Notes (Signed)
Utilization review completed.  

## 2012-11-07 NOTE — Anesthesia Postprocedure Evaluation (Signed)
Anesthesia Post Note  Patient: [name]  Procedure(s) Performed: Procedure(s) (LRB): LEFT TOTAL KNEE ARTHROPLASTY (Left)  Anesthesia type: General  Patient location: PACU  Post pain: Pain level controlled  Post assessment: Post-op Vital signs reviewed  Last Vitals:  Filed Vitals:   11/07/12 1512  BP:   Pulse:   Temp:   Resp: 14    Post vital signs: Reviewed  Level of consciousness: sedated  Complications: No apparent anesthesia complications

## 2012-11-07 NOTE — Op Note (Signed)
Pre-operative diagnosis- Osteoarthritis  Left knee(s)  Post-operative diagnosis- Osteoarthritis Left knee(s)  Procedure-  Left  Total Knee Arthroplasty  Surgeon- Gus Rankin. Fidelia Cathers, MD  Assistant- Amber constable, PA-C   Anesthesia-  General EBL-* No blood loss amount entered *  Drains Hemovac  Tourniquet time-  Total Tourniquet Time Documented: Thigh (Left) - 35 minutes Total: Thigh (Left) - 35 minutes    Complications- None  Condition-PACU - hemodynamically stable.   Brief Clinical Note  [name] is a 79 y.o. year old female with end stage OA of her left knee with progressively worsening pain and dysfunction. She has constant pain, with activity and at rest and significant functional deficits with difficulties even with ADLs. She has had extensive non-op management including analgesics, injections of cortisone and viscosupplements, and home exercise program, but remains in significant pain with significant dysfunction. Radiographs show bone on bone arthritis medial and patellofemoral. She presents now for left Total Knee Arthroplasty.     Procedure in detail---   The patient is brought into the operating room and positioned supine on the operating table. After successful administration of  General,   a tourniquet is placed high on the  Left thigh(s) and the lower extremity is prepped and draped in the usual sterile fashion. Time out is performed by the operating team and then the  Left lower extremity is wrapped in Esmarch, knee flexed and the tourniquet inflated to 300 mmHg.       A midline incision is made with a ten blade through the subcutaneous tissue to the level of the extensor mechanism. A fresh blade is used to make a medial parapatellar arthrotomy. Soft tissue over the proximal medial tibia is subperiosteally elevated to the joint line with a knife and into the semimembranosus bursa with a Cobb elevator. Soft tissue over the proximal lateral tibia is elevated with  attention being paid to avoiding the patellar tendon on the tibial tubercle. The patella is everted, knee flexed 90 degrees and the ACL and PCL are removed. Findings are bone on bone medial and patellofemoral with large medial and patellar osteophytes and large medial ganglion cyst which was excised.        The drill is used to create a starting hole in the distal femur and the canal is thoroughly irrigated with sterile saline to remove the fatty contents. The 5 degree left valgus alignment guide is placed into the femoral canal and the distal femoral cutting block is pinned to remove 10 mm off the distal femur. Resection is made with an oscillating saw.      The tibia is subluxed forward and the menisci are removed. The extramedullary alignment guide is placed referencing proximally at the medial aspect of the tibial tubercle and distally along the second metatarsal axis and tibial crest. The block is pinned to remove 2mm off the more deficient medial  side. Resection is made with an oscillating saw. Size 4is the most appropriate size for the tibia and the proximal tibia is prepared with the modular drill and keel punch for that size.      The femoral sizing guide is placed and size 4 is most appropriate. Rotation is marked off the epicondylar axis and confirmed by creating a rectangular flexion gap at 90 degrees. The size 4 cutting block is pinned in this rotation and the anterior, posterior and chamfer cuts are made with the oscillating saw. The intercondylar block is then placed and that cut is made.  Trial size 4 tibial component, trial size 4 posterior stabilized femur and a 10  mm posterior stabilized rotating platform insert trial is placed. Full extension is achieved with excellent varus/valgus and anterior/posterior balance throughout full range of motion. The patella is everted and thickness measured to be 24  mm. Free hand resection is taken to 14 mm, a 38 template is placed, lug holes are  drilled, trial patella is placed, and it tracks normally. Osteophytes are removed off the posterior femur with the trial in place. All trials are removed and the cut bone surfaces prepared with pulsatile lavage. Cement is mixed and once ready for implantation, the size 4 tibial implant, size  4 posterior stabilized femoral component, and the size 38 patella are cemented in place and the patella is held with the clamp. The trial insert is placed and the knee held in full extension. The Exparel (20 ml mixed with 50 ml saline) is injected into the extensor mechanism, posterior capsule, medial and lateral gutters and subcutaneous tissues.  All extruded cement is removed and once the cement is hard the permanent 10 mm posterior stabilized rotating platform insert is placed into the tibial tray.      The wound is copiously irrigated with saline solution and the extensor mechanism closed over a hemovac drain with #1 PDS suture. The tourniquet is released for a total tourniquet time of 35  minutes. Flexion against gravity is 140 degrees and the patella tracks normally. Subcutaneous tissue is closed with 2.0 vicryl and subcuticular with running 4.0 Monocryl. The incision is cleaned and dried and steri-strips and a bulky sterile dressing are applied. The limb is placed into a knee immobilizer and the patient is awakened and transported to recovery in stable condition.      Please note that a surgical assistant was a medical necessity for this procedure in order to perform it in a safe and expeditious manner. Surgical assistant was necessary to retract the ligaments and vital neurovascular structures to prevent injury to them and also necessary for proper positioning of the limb to allow for anatomic placement of the prosthesis.   Gus Rankin Mary Hockey, MD    11/07/2012, 10:27 AM

## 2012-11-07 NOTE — Preoperative (Signed)
Beta Blockers   Reason not to administer Beta Blockers:Not Applicable 

## 2012-11-07 NOTE — Progress Notes (Signed)
PT Cancellation Note  Patient Details Name: GENESSA BEMAN MRN: [id number] DOB: 12-20-1927   Cancelled Treatment:     Attempted PT eval POD 0-pt declined to participate due to nausea. Preferred for PT to check back in a.m. Thanks.    [name] Alert, MPT Pager: [phone]

## 2012-11-07 NOTE — Anesthesia Preprocedure Evaluation (Addendum)
Anesthesia Evaluation  Patient identified by MRN, date of birth, ID band Patient awake    Reviewed: Allergy & Precautions, H&P , NPO status , Patient's Chart, lab work & pertinent test results  Airway Mallampati: II TM Distance: >3 FB Neck ROM: Full    Dental  (+) Caps,    Pulmonary neg pulmonary ROS,  breath sounds clear to auscultation  Pulmonary exam normal       Cardiovascular hypertension, Pt. on medications + Peripheral Vascular Disease negative cardio ROS  + dysrhythmias Supra Ventricular Tachycardia Rhythm:Regular Rate:Normal     Neuro/Psych negative neurological ROS  negative psych ROS   GI/Hepatic Neg liver ROS, GERD-  Medicated,  Endo/Other  negative endocrine ROS  Renal/GU Renal disease  negative genitourinary   Musculoskeletal negative musculoskeletal ROS (+)   Abdominal   Peds  Hematology negative hematology ROS (+)   Anesthesia Other Findings   Reproductive/Obstetrics negative OB ROS                          Anesthesia Physical Anesthesia Plan  ASA: II  Anesthesia Plan: General   Post-op Pain Management:    Induction: Intravenous  Airway Management Planned: Oral ETT and LMA  Additional Equipment:   Intra-op Plan:   Post-operative Plan: Extubation in OR  Informed Consent:   Dental advisory given  Plan Discussed with: CRNA  Anesthesia Plan Comments: (Patient does not desire regional/spinal anesthesia.)        Anesthesia Quick Evaluation

## 2012-11-07 NOTE — Transfer of Care (Signed)
Immediate Anesthesia Transfer of Care Note  Patient: [name]  Procedure(s) Performed: Procedure(s): LEFT TOTAL KNEE ARTHROPLASTY (Left)  Patient Location: PACU  Anesthesia Type:General  Level of Consciousness: awake, alert  and oriented  Airway & Oxygen Therapy: Patient Spontanous Breathing and Patient connected to face mask oxygen  Post-op Assessment: Report given to PACU RN and Post -op Vital signs reviewed and stable  Post vital signs: Reviewed and stable  Complications: No apparent anesthesia complications

## 2012-11-07 NOTE — Progress Notes (Signed)
Dr Lequita Halt in and took a lot at tick bite on left shin.

## 2012-11-07 NOTE — Interval H&P Note (Signed)
History and Physical Interval Note:  11/07/2012 6:59 AM  [name]  has presented today for surgery, with the diagnosis of oa left knee   The various methods of treatment have been discussed with the patient and family. After consideration of risks, benefits and other options for treatment, the patient has consented to  Procedure(s): LEFT TOTAL KNEE ARTHROPLASTY (Left) as a surgical intervention .  The patient's history has been reviewed, patient examined, no change in status, stable for surgery.  I have reviewed the patient's chart and labs.  Questions were answered to the patient's satisfaction.     Loanne Drilling

## 2012-11-08 ENCOUNTER — Encounter (HOSPITAL_COMMUNITY): Payer: Self-pay | Admitting: Orthopedic Surgery

## 2012-11-08 LAB — CBC
Hemoglobin: 10.9 g/dL — ABNORMAL LOW (ref 12.0–15.0)
MCV: 86.2 fL (ref 78.0–100.0)
Platelets: 154 10*3/uL (ref 150–400)
RBC: 3.7 MIL/uL — ABNORMAL LOW (ref 3.87–5.11)
WBC: 10.5 10*3/uL (ref 4.0–10.5)

## 2012-11-08 LAB — BASIC METABOLIC PANEL
CO2: 27 mEq/L (ref 19–32)
Chloride: 97 mEq/L (ref 96–112)
Glucose, Bld: 160 mg/dL — ABNORMAL HIGH (ref 70–99)
Sodium: 132 mEq/L — ABNORMAL LOW (ref 135–145)

## 2012-11-08 MED ORDER — CYCLOBENZAPRINE HCL 10 MG PO TABS: 10.0000 mg | ORAL_TABLET | Freq: Three times a day (TID) | ORAL | Status: DC | PRN

## 2012-11-08 MED ORDER — RIVAROXABAN 10 MG PO TABS: 10.0000 mg | ORAL_TABLET | Freq: Every day | ORAL | Status: DC

## 2012-11-08 MED ORDER — TRAMADOL HCL 50 MG PO TABS: 50.0000 mg | ORAL_TABLET | Freq: Four times a day (QID) | ORAL | Status: DC | PRN

## 2012-11-08 NOTE — Evaluation (Signed)
Physical Therapy Evaluation Patient Details Name: [name] MRN: [id number] DOB: 1927/07/14 Today's Date: 11/08/2012 Time: 4098-1191 PT Time Calculation (min): 32 min  PT Assessment / Plan / Recommendation Clinical Impression  pt s/p L TKA and will benefit form PT to improve independence with functional mobility in preparation for hoem with family assist    PT Assessment  Patient needs continued PT services    Follow Up Recommendations  Home health PT;Supervision for mobility/OOB    Does the patient have the potential to tolerate intense rehabilitation      Barriers to Discharge        Equipment Recommendations  None recommended by PT    Recommendations for Other Services     Frequency 7X/week    Precautions / Restrictions Precautions Precautions: Knee Required Braces or Orthoses: Knee Immobilizer - Left Knee Immobilizer - Left: Discontinue once straight leg raise with < 10 degree lag Restrictions LLE Weight Bearing: Weight bearing as tolerated   Pertinent Vitals/Pain Pain better      Mobility  Bed Mobility Bed Mobility: Supine to Sit Supine to Sit: 4: Min assist Details for Bed Mobility Assistance: cues for technique Transfers Transfers: Sit to Stand;Stand to Sit Sit to Stand: 4: Min assist Stand to Sit: 4: Min assist Details for Transfer Assistance: cues for hand placement and LLE management Ambulation/Gait Ambulation/Gait Assistance: 4: Min assist;4: Min [employer] social research officer (Feet): 25 Feet Assistive device: Rolling walker Ambulation/Gait Assistance Details: cues for sequence and use of UEs for pain control/WBing LLE Gait Pattern: Step-to pattern;Antalgic    Exercises Total Joint Exercises Ankle Circles/Pumps: AROM;Both;10 reps Quad Sets: AROM;Both;10 reps Heel Slides: AROM;AAROM;Both;10 reps Straight Leg Raises: AAROM;Left;10 reps Goniometric ROM: grossly 12-40 degrees   PT Diagnosis: Difficulty walking  PT Problem List: Decreased  strength;Decreased range of motion;Decreased activity tolerance;Decreased mobility;Decreased knowledge of use of DME PT Treatment Interventions: Functional mobility training;Gait training;DME instruction;Therapeutic activities;Therapeutic exercise;Patient/family education   PT Goals Acute Rehab PT Goals PT Goal Formulation: With patient Time For Goal Achievement: 11/12/12 Potential to Achieve Goals: Good Pt will go Supine/Side to Sit: with supervision PT Goal: Supine/Side to Sit - Progress: Goal set today Pt will go Sit to Supine/Side: with supervision PT Goal: Sit to Supine/Side - Progress: Goal set today Pt will go Sit to Stand: with supervision;with modified independence PT Goal: Sit to Stand - Progress: Goal set today Pt will go Stand to Sit: with supervision;with modified independence PT Goal: Stand to Sit - Progress: Goal set today Pt will Ambulate: 51 - 150 feet;with supervision;with modified independence;with rolling walker PT Goal: Ambulate - Progress: Goal set today Pt will Perform Home Exercise Program: with supervision, verbal cues required/provided PT Goal: Perform Home Exercise Program - Progress: Goal set today  Visit Information  Last PT Received On: 11/08/12 Assistance Needed: +1    Subjective Data  Subjective: getting better (nauseous earlier) Patient Stated Goal: home   Prior Functioning  Home Living Lives With: Alone Available Help at Discharge: Family (daughters) Type of Home: House Home Layout: One level;Laundry or work area in [employer] Equipment: Bedside commode/3-in-1;Walker - rolling Prior Function Level of Independence: Independent with assistive device(s) Able to Take Stairs?: Yes Driving: Yes Comments: uses cane due to knee pain Communication Communication: No difficulties    Cognition  Cognition Arousal/Alertness: Awake/alert Behavior During Therapy: WFL for tasks assessed/performed Overall Cognitive Status: Within Functional  Limits for tasks assessed    Extremity/Trunk Assessment Right Upper Extremity Assessment RUE ROM/Strength/Tone: [facility] for tasks assessed  Left Upper Extremity Assessment LUE ROM/Strength/Tone: WFL for tasks assessed Right Lower Extremity Assessment RLE ROM/Strength/Tone: [facility] for tasks assessed Left Lower Extremity Assessment LLE ROM/Strength/Tone: Deficits LLE ROM/Strength/Tone Deficits: able to assist with SLR; ankle WFL   Balance    End of Session PT - End of Session Equipment Utilized During Treatment: Gait belt;Left knee immobilizer Activity Tolerance: Patient tolerated treatment well Patient left: in chair;with call bell/phone within reach CPM Left Knee CPM Left Knee: Off  GP     [facility] 11/08/2012, 12:25 PM

## 2012-11-08 NOTE — Discharge Summary (Signed)
Physician Discharge Summary   Patient ID: [name] MRN: [id number] DOB/AGE: Sep 10, 1927 79 y.o.  Admit date: 11/07/2012 Discharge date: 11/09/2012  Primary Diagnosis:  Osteoarthritis Left knee  Admission Diagnoses:  Past Medical History  Diagnosis Date  . Hypertension   . Dysrhythmia 8/13    palpitations/ now resolved  . Depression   . GERD (gastroesophageal reflux disease)   . Restless legs syndrome   . Arthritis   . History of blood transfusion 2012  . Peripheral vascular disease     varicose veins bilaterally   . Chronic kidney disease    Discharge Diagnoses:   Principal Problem:   OA (osteoarthritis) of knee Active Problems:   Postop Hyponatremia  Estimated body mass index is 27.56 kg/(m^2) as calculated from the following:   Height as of this encounter: 5\' 7"  (1.702 m).   Weight as of this encounter: 79.833 kg (176 lb).  Procedure:  Procedure(s) (LRB): LEFT TOTAL KNEE ARTHROPLASTY (Left)   Consults: None  HPI: [name] is a 79 y.o. year old female with end stage OA of her left knee with progressively worsening pain and dysfunction. She has constant pain, with activity and at rest and significant functional deficits with difficulties even with ADLs. She has had extensive non-op management including analgesics, injections of cortisone and viscosupplements, and home exercise program, but remains in significant pain with significant dysfunction. Radiographs show bone on bone arthritis medial and patellofemoral. She presents now for left Total Knee Arthroplasty.   Laboratory Data: Admission on 11/07/2012  Component Date Value Range Status  . ABO/RH(D) 11/07/2012 O NEG   Final  . Antibody Screen 11/07/2012 NEG   Final  . Sample Expiration 11/07/2012 11/10/2012   Final  . WBC 11/08/2012 10.5  4.0 - 10.5 K/uL Final  . RBC 11/08/2012 3.70* 3.87 - 5.11 MIL/uL Final  . Hemoglobin 11/08/2012 10.9* 12.0 - 15.0 g/dL Final  . HCT 14/78/2956 31.9* 36.0 - 46.0 %  Final  . MCV 11/08/2012 86.2  78.0 - 100.0 fL Final  . MCH 11/08/2012 29.5  26.0 - 34.0 pg Final  . MCHC 11/08/2012 34.2  30.0 - 36.0 g/dL Final  . RDW 21/30/8657 14.3  11.5 - 15.5 % Final  . Platelets 11/08/2012 154  150 - 400 K/uL Final  . Sodium 11/08/2012 132* 135 - 145 mEq/L Final  . Potassium 11/08/2012 3.9  3.5 - 5.1 mEq/L Final  . Chloride 11/08/2012 97  96 - 112 mEq/L Final  . CO2 11/08/2012 27  19 - 32 mEq/L Final  . Glucose, Bld 11/08/2012 160* 70 - 99 mg/dL Final  . BUN 84/69/6295 15  6 - 23 mg/dL Final  . Creatinine, Ser 11/08/2012 0.87  0.50 - 1.10 mg/dL Final  . Calcium 28/41/3244 8.5  8.4 - 10.5 mg/dL Final  . GFR calc non Af Amer 11/08/2012 60* >90 mL/min Final  . GFR calc Af Amer 11/08/2012 69* >90 mL/min Final   Comment:                                 The eGFR has been calculated                          using the CKD EPI equation.                          This  calculation has not been                          validated in all clinical                          situations.                          eGFR's persistently                          <90 mL/min signify                          possible Chronic Kidney Disease.  Hospital Outpatient Visit on 10/25/2012  Component Date Value Range Status  . aPTT 10/25/2012 30  24 - 37 seconds Final  . WBC 10/25/2012 5.4  4.0 - 10.5 K/uL Final  . RBC 10/25/2012 4.88  3.87 - 5.11 MIL/uL Final  . Hemoglobin 10/25/2012 14.6  12.0 - 15.0 g/dL Final  . HCT 62/95/2841 42.7  36.0 - 46.0 % Final  . MCV 10/25/2012 87.5  78.0 - 100.0 fL Final  . MCH 10/25/2012 29.9  26.0 - 34.0 pg Final  . MCHC 10/25/2012 34.2  30.0 - 36.0 g/dL Final  . RDW 32/44/0102 14.7  11.5 - 15.5 % Final  . Platelets 10/25/2012 215  150 - 400 K/uL Final  . Sodium 10/25/2012 136  135 - 145 mEq/L Final  . Potassium 10/25/2012 4.5  3.5 - 5.1 mEq/L Final  . Chloride 10/25/2012 100  96 - 112 mEq/L Final  . CO2 10/25/2012 29  19 - 32 mEq/L Final  . Glucose, Bld  10/25/2012 96  70 - 99 mg/dL Final  . BUN 72/53/6644 18  6 - 23 mg/dL Final  . Creatinine, Ser 10/25/2012 1.03  0.50 - 1.10 mg/dL Final  . Calcium 03/47/4259 9.8  8.4 - 10.5 mg/dL Final  . Total Protein 10/25/2012 7.7  6.0 - 8.3 g/dL Final  . Albumin 56/38/7564 4.1  3.5 - 5.2 g/dL Final  . AST 33/29/5188 19  0 - 37 U/L Final  . ALT 10/25/2012 13  0 - 35 U/L Final  . Alkaline Phosphatase 10/25/2012 89  39 - 117 U/L Final  . Total Bilirubin 10/25/2012 0.5  0.3 - 1.2 mg/dL Final  . GFR calc non Af Amer 10/25/2012 49* >90 mL/min Final  . GFR calc Af Amer 10/25/2012 57* >90 mL/min Final   Comment:                                 The eGFR has been calculated                          using the CKD EPI equation.                          This calculation has not been                          validated in all clinical  situations.                          eGFR's persistently                          <90 mL/min signify                          possible Chronic Kidney Disease.  Marland Kitchen Prothrombin Time 10/25/2012 13.0  11.6 - 15.2 seconds Final  . INR 10/25/2012 0.99  0.00 - 1.49 Final  . Color, Urine 10/25/2012 YELLOW  YELLOW Final  . APPearance 10/25/2012 CLEAR  CLEAR Final  . Specific Gravity, Urine 10/25/2012 1.008  1.005 - 1.030 Final  . pH 10/25/2012 7.0  5.0 - 8.0 Final  . Glucose, UA 10/25/2012 NEGATIVE  NEGATIVE mg/dL Final  . Hgb urine dipstick 10/25/2012 NEGATIVE  NEGATIVE Final  . Bilirubin Urine 10/25/2012 NEGATIVE  NEGATIVE Final  . Ketones, ur 10/25/2012 NEGATIVE  NEGATIVE mg/dL Final  . Protein, ur 08/65/7846 NEGATIVE  NEGATIVE mg/dL Final  . Urobilinogen, UA 10/25/2012 0.2  0.0 - 1.0 mg/dL Final  . Nitrite 96/29/5284 NEGATIVE  NEGATIVE Final  . Leukocytes, UA 10/25/2012 NEGATIVE  NEGATIVE Final   MICROSCOPIC NOT DONE ON URINES WITH NEGATIVE PROTEIN, BLOOD, LEUKOCYTES, NITRITE, OR GLUCOSE <1000 mg/dL.  Marland Kitchen MRSA, PCR 10/25/2012 NEGATIVE  NEGATIVE Final  .  Staphylococcus aureus 10/25/2012 NEGATIVE  NEGATIVE Final   Comment:                                 The Xpert SA Assay (FDA                          approved for NASAL specimens                          in patients over 84 years of age),                          is one component of                          a comprehensive surveillance                          program.  Test performance has                          been validated by [employer] for patients greater                          than or equal to 45 year old.                          It is not intended                          to diagnose infection nor to  guide or monitor treatment.     X-Rays:Dg Chest 2 View  10/25/2012  *RADIOLOGY REPORT*  Clinical Data:  Preoperative assessment for left total knee replacement, history hypertension  CHEST - 2 VIEW  Comparison: 03/02/2011  Findings: Normal heart size and pulmonary vascularity. Tortuous aorta. Mild emphysematous and minimal bronchitic changes consistent with COPD. Minimal biapical scarring. No acute infiltrate, pleural effusion or pneumothorax. Broad-based dextroconvex thoracic scoliosis.  IMPRESSION: Changes of COPD. No acute abnormalities.   Original Report Authenticated By: Ulyses Southward, M.D.     EKG:No orders found for this or any previous visit.   Hospital Course: [name] is a 79 y.o. who was admitted to [facility]. They were brought to the operating room on 11/07/2012 and underwent Procedure(s): LEFT TOTAL KNEE ARTHROPLASTY.  Patient tolerated the procedure well and was later transferred to the recovery room and then to the orthopaedic floor for postoperative care.  They were given PO and IV analgesics for pain control following their surgery.  They were given 24 hours of postoperative antibiotics of  Anti-infectives   Start     Dose/Rate Route Frequency Ordered Stop   11/07/12 1530  ceFAZolin (ANCEF) IVPB 1  g/50 mL premix     1 g 100 mL/hr over 30 Minutes Intravenous 4 times per day 11/07/12 1031 11/08/12 0102   11/07/12 0645  ceFAZolin (ANCEF) IVPB 2 g/50 mL premix     2 g 100 mL/hr over 30 Minutes Intravenous On call to O.R. 11/07/12 4540 11/07/12 0920     and started on DVT prophylaxis in the form of Xarelto.   PT and OT were ordered for total joint protocol.  Discharge planning consulted to help with postop disposition and equipment needs.  Patient had a tough night on the evening of surgery due to some nausea and sickness.  They started to get up OOB with therapy on day one. Hemovac drain was pulled without difficulty.  Continued to work with therapy into day two.  Dressing was changed on day two and the incision was healing well.  Patient was seen in rounds and feeling better.  The patinet was ready to go home later that same day.   Discharge Medications: Prior to Admission medications   Medication Sig Start Date End Date Taking? Authorizing Provider  acebutolol (SECTRAL) 400 MG capsule Take 400 mg by mouth daily after breakfast.    Yes Historical Provider, MD  acetaminophen (TYLENOL) 500 MG tablet Take 500 mg by mouth every 6 (six) hours as needed for pain.   Yes Historical Provider, MD  amLODipine (NORVASC) 5 MG tablet Take 5 mg by mouth daily after breakfast.    Yes Historical Provider, MD  busPIRone (BUSPAR) 5 MG tablet Take 5 mg by mouth 2 (two) times daily.   Yes Historical Provider, MD  cetirizine (ZYRTEC) 10 MG tablet Take 10 mg by mouth daily.   Yes Historical Provider, MD  cyclobenzaprine (FLEXERIL) 10 MG tablet Take 10 mg by mouth at bedtime as needed for muscle spasms.   Yes Historical Provider, MD  Omeprazole-Sodium Bicarbonate (ZEGERID) 20-1100 MG CAPS Take 1 capsule by mouth at bedtime.    Yes Historical Provider, MD  pantoprazole (PROTONIX) 40 MG tablet Take 40 mg by mouth daily.   Yes Historical Provider, MD  ropinirole (REQUIP) 5 MG tablet Take 10 mg by mouth at bedtime.     Yes Historical Provider, MD  cyclobenzaprine (FLEXERIL) 10 MG tablet Take 1 tablet (10 mg total) by mouth 3 (  three) times daily as needed for muscle spasms. 11/08/12   Alexzandrew Julien Girt, PA-C  rivaroxaban (XARELTO) 10 MG TABS tablet Take 1 tablet (10 mg total) by mouth daily with breakfast. Take Xarelto for two and a half more weeks, then discontinue Xarelto. 11/08/12   Alexzandrew Perkins, PA-C  traMADol (ULTRAM) 50 MG tablet Take 1-2 tablets (50-100 mg total) by mouth every 6 (six) hours as needed. 11/08/12   Alexzandrew Julien Girt, PA-C    Diet: Cardiac diet Activity:WBAT Follow-up:in the next few weeks Disposition - Home Discharged Condition: good   Discharge Orders   Future Orders Complete By Expires     Call MD / Call 911  As directed     Comments:      If you experience chest pain or shortness of breath, CALL 911 and be transported to the hospital emergency room.  If you develope a fever above 101 F, pus (white drainage) or increased drainage or redness at the wound, or calf pain, call your surgeon's office.    Change dressing  As directed     Comments:      Change dressing daily with sterile 4 x 4 inch gauze dressing and apply TED hose. Do not submerge the incision under water.    Constipation Prevention  As directed     Comments:      Drink plenty of fluids.  Prune juice may be helpful.  You may use a stool softener, such as Colace (over the counter) 100 mg twice a day.  Use MiraLax (over the counter) for constipation as needed.    Diet - low sodium heart healthy  As directed     Discharge instructions  As directed     Comments:      Pick up stool softner and laxative for home. Do not submerge incision under water. May shower. Continue to use ice for pain and swelling from surgery.   Take Xarelto for two and a half more weeks, then discontinue Xarelto.    Do not put a pillow under the knee. Place it under the heel.  As directed     Do not sit on low chairs, stoools or toilet  seats, as it may be difficult to get up from low surfaces  As directed     Driving restrictions  As directed     Comments:      No driving until released by the physician.    Increase activity slowly as tolerated  As directed     Lifting restrictions  As directed     Comments:      No lifting until released by the physician.    Patient may shower  As directed     Comments:      You may shower without a dressing once there is no drainage.  Do not wash over the wound.  If drainage remains, do not shower until drainage stops.    TED hose  As directed     Comments:      Use stockings (TED hose) for 3 weeks on both leg(s).  You may remove them at night for sleeping.    Weight bearing as tolerated  As directed         Medication List    STOP taking these medications       estradiol 1 MG tablet  Commonly known as:  ESTRACE      TAKE these medications       acebutolol 400 MG capsule  Commonly known as:  SECTRAL  Take 400 mg by mouth daily after breakfast.     acetaminophen 500 MG tablet  Commonly known as:  TYLENOL  Take 500 mg by mouth every 6 (six) hours as needed for pain.     amLODipine 5 MG tablet  Commonly known as:  NORVASC  Take 5 mg by mouth daily after breakfast.     busPIRone 5 MG tablet  Commonly known as:  BUSPAR  Take 5 mg by mouth 2 (two) times daily.     cetirizine 10 MG tablet  Commonly known as:  ZYRTEC  Take 10 mg by mouth daily.     cyclobenzaprine 10 MG tablet  Commonly known as:  FLEXERIL  Take 10 mg by mouth at bedtime as needed for muscle spasms.     cyclobenzaprine 10 MG tablet  Commonly known as:  FLEXERIL  Take 1 tablet (10 mg total) by mouth 3 (three) times daily as needed for muscle spasms.     Omeprazole-Sodium Bicarbonate 20-1100 MG Caps  Commonly known as:  ZEGERID  Take 1 capsule by mouth at bedtime.     pantoprazole 40 MG tablet  Commonly known as:  PROTONIX  Take 40 mg by mouth daily.     rivaroxaban 10 MG Tabs tablet    Commonly known as:  XARELTO  Take 1 tablet (10 mg total) by mouth daily with breakfast. Take Xarelto for two and a half more weeks, then discontinue Xarelto.     ropinirole 5 MG tablet  Commonly known as:  REQUIP  Take 10 mg by mouth at bedtime.     traMADol 50 MG tablet  Commonly known as:  ULTRAM  Take 1-2 tablets (50-100 mg total) by mouth every 6 (six) hours as needed.           Follow-up Information   Follow up with Loanne Drilling, MD. Schedule an appointment as soon as possible for a visit in 2 weeks.   Contact information:   [address] [place] [address] [phone]       Signed: Patrica Duel 11/08/2012, 9:43 PM

## 2012-11-08 NOTE — Progress Notes (Signed)
Pt has chosen [facility] to provide HHPT services, referral made.

## 2012-11-08 NOTE — Progress Notes (Signed)
Physical Therapy Treatment Patient Details Name: [name] MRN: [id number] DOB: 1927-08-18 Today's Date: 11/08/2012 Time: 4098-1191 PT Time Calculation (min): 12 min  PT Assessment / Plan / Recommendation Comments on Treatment Session  Pt. tolerated ambulation again this PM.     Follow Up Recommendations  Home health PT;Supervision for mobility/OOB     Does the patient have the potential to tolerate intense rehabilitation     Barriers to Discharge        Equipment Recommendations  None recommended by PT    Recommendations for Other Services    Frequency 7X/week   Plan Frequency remains appropriate    Precautions / Restrictions Precautions Precautions: Knee Required Braces or Orthoses: Knee Immobilizer - Left Knee Immobilizer - Left: Discontinue once straight leg raise with < 10 degree lag Restrictions LLE Weight Bearing: Weight bearing as tolerated   Pertinent Vitals/Pain Sore.    Mobility  Bed Mobility Bed Mobility: Sit to Supine Supine to Sit: 4: Min assist Sit to Supine: 4: Min assist Details for Bed Mobility Assistance: assistance for LLE onto bed. Transfers Transfers: Sit to Stand;Stand to Sit Sit to Stand: 4: Min assist;From chair/3-in-1 Stand to Sit: To bed;4: Min assist Details for Transfer Assistance: cues for hand placement and LLE management Ambulation/Gait Ambulation/Gait Assistance: 4: Min assist Ambulation Distance (Feet): 25 Feet Assistive device: Rolling walker Ambulation/Gait Assistance Details: cues for sequence and positionminside RW. Gait Pattern: Step-to pattern;Antalgic    Exercises Total Joint Exercises Ankle Circles/Pumps: AROM;Both;10 reps Quad Sets: AROM;Both;10 reps Heel Slides: AROM;AAROM;Both;10 reps Straight Leg Raises: AAROM;Left;10 reps Goniometric ROM: grossly 12-40 degrees   PT Diagnosis: Difficulty walking  PT Problem List: Decreased strength;Decreased range of motion;Decreased activity tolerance;Decreased  mobility;Decreased knowledge of use of DME PT Treatment Interventions: Functional mobility training;Gait training;DME instruction;Therapeutic activities;Therapeutic exercise;Patient/family education   PT Goals Acute Rehab PT Goals PT Goal Formulation: With patient Time For Goal Achievement: 11/12/12 Potential to Achieve Goals: Good Pt will go Supine/Side to Sit: with supervision PT Goal: Supine/Side to Sit - Progress: Goal set today Pt will go Sit to Supine/Side: with supervision PT Goal: Sit to Supine/Side - Progress: Progressing toward goal Pt will go Sit to Stand: with supervision;with modified independence PT Goal: Sit to Stand - Progress: Progressing toward goal Pt will go Stand to Sit: with supervision;with modified independence PT Goal: Stand to Sit - Progress: Progressing toward goal Pt will Ambulate: 51 - 150 feet;with supervision;with modified independence;with rolling walker PT Goal: Ambulate - Progress: Progressing toward goal Pt will Perform Home Exercise Program: with supervision, verbal cues required/provided PT Goal: Perform Home Exercise Program - Progress: Goal set today  Visit Information  Last PT Received On: 11/08/12 Assistance Needed: +1    Subjective Data  Subjective: I  guess I can walk again. Patient Stated Goal: home   Cognition  Cognition Arousal/Alertness: Awake/alert Behavior During Therapy: WFL for tasks assessed/performed Overall Cognitive Status: Within Functional Limits for tasks assessed    Balance     End of Session PT - End of Session Equipment Utilized During Treatment: Left knee immobilizer Activity Tolerance: Patient tolerated treatment well Patient left: with call bell/phone within reach;in bed   GP     Rada Hay 11/08/2012, 3:00 PM Blanchard Kelch PT [phone]

## 2012-11-08 NOTE — Progress Notes (Signed)
   Subjective: 1 Day Post-Op Procedure(s) (LRB): LEFT TOTAL KNEE ARTHROPLASTY (Left) Patient reports pain as mild.  Not eating much. Some sickness and nausea. Patient seen in rounds with Dr. Lequita Halt.  Pain controlled. Patient is well, and has had no acute complaints or problems We will start therapy today.  Plan is to go Home after hospital stay.  Objective: Vital signs in last 24 hours: Temp:  [96 F (35.6 C)-98.5 F (36.9 C)] 98.5 F (36.9 C) (04/29 0655) Pulse Rate:  [55-77] 71 (04/29 0655) Resp:  [12-18] 14 (04/29 0655) BP: (118-157)/(57-84) 118/69 mmHg (04/29 0655) SpO2:  [95 %-100 %] 97 % (04/29 0655) Weight:  [79.833 kg (176 lb)] 79.833 kg (176 lb) (04/28 1310)  Intake/Output from previous day:  Intake/Output Summary (Last 24 hours) at 11/08/12 0813 Last data filed at 11/08/12 0751  Gross per 24 hour  Intake 3703.75 ml  Output   2920 ml  Net 783.75 ml    Intake/Output this shift: Total I/O In: 240 [P.O.:240] Out: -   Labs:  Recent Labs  11/08/12 0428  HGB 10.9*    Recent Labs  11/08/12 0428  WBC 10.5  RBC 3.70*  HCT 31.9*  PLT 154    Recent Labs  11/08/12 0428  NA 132*  K 3.9  CL 97  CO2 27  BUN 15  CREATININE 0.87  GLUCOSE 160*  CALCIUM 8.5   No results found for this basename: LABPT, INR,  in the last 72 hours  EXAM General - Patient is Alert, Appropriate and Oriented Extremity - Neurovascular intact Sensation intact distally Dorsiflexion/Plantar flexion intact Dressing - dressing C/D/I Motor Function - intact, moving foot and toes well on exam.  Hemovac pulled without difficulty.  Past Medical History  Diagnosis Date  . Hypertension   . Dysrhythmia 8/13    palpitations/ now resolved  . Depression   . GERD (gastroesophageal reflux disease)   . Restless legs syndrome   . Arthritis   . History of blood transfusion 2012  . Peripheral vascular disease     varicose veins bilaterally   . Chronic kidney disease      Assessment/Plan: 1 Day Post-Op Procedure(s) (LRB): LEFT TOTAL KNEE ARTHROPLASTY (Left) Principal Problem:   OA (osteoarthritis) of knee Active Problems:   Postop Hyponatremia  Estimated body mass index is 27.56 kg/(m^2) as calculated from the following:   Height as of this encounter: 5\' 7"  (1.702 m).   Weight as of this encounter: 79.833 kg (176 lb). Up with therapy Plan for discharge tomorrow Discharge home with home health  DC the oxycodone.   Use Ultram.  DVT Prophylaxis - Xarelto Weight-Bearing as tolerated to left leg No vaccines. D/C O2 and Pulse OX and try on Room [address]  Patrica Duel 11/08/2012, 8:13 AM

## 2012-11-09 LAB — BASIC METABOLIC PANEL
CO2: 28 mEq/L (ref 19–32)
GFR calc non Af Amer: 63 mL/min — ABNORMAL LOW (ref >90)
Glucose, Bld: 126 mg/dL — ABNORMAL HIGH (ref 70–99)
Potassium: 4.3 mEq/L (ref 3.5–5.1)
Sodium: 131 mEq/L — ABNORMAL LOW (ref 135–145)

## 2012-11-09 LAB — CBC
Hemoglobin: 9.6 g/dL — ABNORMAL LOW (ref 12.0–15.0)
Platelets: 146 10*3/uL — ABNORMAL LOW (ref 150–400)
RBC: 3.38 MIL/uL — ABNORMAL LOW (ref 3.87–5.11)

## 2012-11-09 NOTE — Progress Notes (Signed)
OT Cancellation Note  Patient Details Name: [name] MRN: [id number] DOB: Mar 12, 1928   Cancelled Treatment:    Reason Eval/Treat Not Completed: Other (comment) (has had knee surgery in the past. declines OT needs. Has all DME and has assist from family available.)  [name] [phone] 11/09/2012, 9:07 AM

## 2012-11-09 NOTE — Progress Notes (Signed)
   Subjective: 2 Days Post-Op Procedure(s) (LRB): LEFT TOTAL KNEE ARTHROPLASTY (Left) Patient reports pain as mild.   Patient seen in rounds with Dr. Lequita Halt. Patient is well, and has had no acute complaints or problems Patient is ready to go home  Objective: Vital signs in last 24 hours: Temp:  [97.3 F (36.3 C)-98.3 F (36.8 C)] 97.3 F (36.3 C) (04/30 0648) Pulse Rate:  [72-82] 72 (04/30 0648) Resp:  [16] 16 (04/30 0648) BP: (123-146)/(65-76) 132/66 mmHg (04/30 0648) SpO2:  [94 %-100 %] 96 % (04/30 0648)  Intake/Output from previous day:  Intake/Output Summary (Last 24 hours) at 11/09/12 0744 Last data filed at 11/09/12 0651  Gross per 24 hour  Intake 1175.5 ml  Output   1400 ml  Net -224.5 ml    Intake/Output this shift:    Labs:  Recent Labs  11/08/12 0428 11/09/12 0437  HGB 10.9* 9.6*    Recent Labs  11/08/12 0428 11/09/12 0437  WBC 10.5 9.7  RBC 3.70* 3.38*  HCT 31.9* 29.2*  PLT 154 146*    Recent Labs  11/08/12 0428 11/09/12 0437  NA 132* 131*  K 3.9 4.3  CL 97 99  CO2 27 28  BUN 15 20  CREATININE 0.87 0.83  GLUCOSE 160* 126*  CALCIUM 8.5 8.8   No results found for this basename: LABPT, INR,  in the last 72 hours  EXAM: General - Patient is Alert, Appropriate and Oriented Extremity - Neurovascular intact Sensation intact distally Dorsiflexion/Plantar flexion intact No cellulitis present Incision - clean, dry, no drainage, healing Motor Function - intact, moving foot and toes well on exam.   Assessment/Plan: 2 Days Post-Op Procedure(s) (LRB): LEFT TOTAL KNEE ARTHROPLASTY (Left) Procedure(s) (LRB): LEFT TOTAL KNEE ARTHROPLASTY (Left) Past Medical History  Diagnosis Date  . Hypertension   . Dysrhythmia 8/13    palpitations/ now resolved  . Depression   . GERD (gastroesophageal reflux disease)   . Restless legs syndrome   . Arthritis   . History of blood transfusion 2012  . Peripheral vascular disease     varicose veins  bilaterally   . Chronic kidney disease    Principal Problem:   OA (osteoarthritis) of knee Active Problems:   Postop Hyponatremia  Estimated body mass index is 27.56 kg/(m^2) as calculated from the following:   Height as of this encounter: 5\' 7"  (1.702 m).   Weight as of this encounter: 79.833 kg (176 lb). Up with therapy Discharge home with home health Diet - Cardiac diet Follow up - in 2 weeks Activity - WBAT Disposition - Home Condition Upon Discharge - Good D/C Meds - See DC Summary DVT Prophylaxis - Xarelto  [name] 11/09/2012, 7:44 AM

## 2012-11-09 NOTE — Progress Notes (Signed)
Physical Therapy Treatment Patient Details Name: [name] MRN: [id number] DOB: 16-Mar-1928 Today's Date: 11/09/2012 Time: 1145-1209 PT Time Calculation (min): 24 min  PT Assessment / Plan / Recommendation Comments on Treatment Session  pt doing great; ready for D/C form PT standpoint    Follow Up Recommendations  Home health PT;Supervision for mobility/OOB     Does the patient have the potential to tolerate intense rehabilitation     Barriers to Discharge        Equipment Recommendations  None recommended by PT    Recommendations for Other Services    Frequency 7X/week   Plan Discharge plan remains appropriate;Frequency remains appropriate    Precautions / Restrictions Precautions Precautions: Knee Required Braces or Orthoses: Knee Immobilizer - Left Knee Immobilizer - Left: Discontinue once straight leg raise with < 10 degree lag Restrictions LLE Weight Bearing: Weight bearing as tolerated   Pertinent Vitals/Pain Pain knee 0/10    Mobility  Bed Mobility Bed Mobility: Not assessed Transfers Transfers: Sit to Stand;Stand to Sit Sit to Stand: 5: Supervision;From chair/3-in-1;With upper extremity assist Stand to Sit: 5: Supervision;To chair/3-in-1;With upper extremity assist Details for Transfer Assistance: cues for hand placement and LLE management Ambulation/Gait Ambulation/Gait Assistance: 5: Supervision Ambulation Distance (Feet): 50 Feet Assistive device: Rolling walker Ambulation/Gait Assistance Details: cues for RW position and sequence Gait Pattern: Step-to pattern;Step-through pattern    Exercises Total Joint Exercises Ankle Circles/Pumps: AROM;Both;10 reps Quad Sets: AROM;Both;10 reps Heel Slides: AROM;AAROM;Both;10 reps Straight Leg Raises: AAROM;Left;10 reps Goniometric ROM: 57*   PT Diagnosis:    PT Problem List:   PT Treatment Interventions:     PT Goals Acute Rehab PT Goals Time For Goal Achievement: 11/12/12 Potential to Achieve Goals:  Good Pt will go Sit to Stand: with supervision;with modified independence PT Goal: Sit to Stand - Progress: Met Pt will go Stand to Sit: with supervision;with modified independence PT Goal: Stand to Sit - Progress: Met Pt will Ambulate: 51 - 150 feet;with supervision;with modified independence;with rolling walker PT Goal: Ambulate - Progress: Met Pt will Perform Home Exercise Program: with supervision, verbal cues required/provided PT Goal: Perform Home Exercise Program - Progress: Met  Visit Information  Last PT Received On: 11/09/12 Assistance Needed: +1    Subjective Data  Subjective: I think I am going home Patient Stated Goal: home   Cognition  Cognition Arousal/Alertness: Awake/alert Behavior During Therapy: WFL for tasks assessed/performed Overall Cognitive Status: Within Functional Limits for tasks assessed    Balance     End of Session PT - End of Session Equipment Utilized During Treatment: Left knee immobilizer Activity Tolerance: Patient tolerated treatment well Patient left: with call bell/phone within reach;in chair Nurse Communication: Mobility status   GP     [facility] 11/09/2012, 12:16 PM

## 2012-11-09 NOTE — Progress Notes (Signed)
[facility] is unable to staff case in pt's area, talked to pt about this. She chose [place] to provide HHPT services, referral made.  Algernon Huxley RN BSN   [phone]

## 2013-05-26 ENCOUNTER — Inpatient Hospital Stay (HOSPITAL_COMMUNITY)
Admission: EM | Admit: 2013-05-26 | Discharge: 2013-05-27 | DRG: 069 | Disposition: A | Discharge status: Home / Self Care | Payer: Medicare Other | Attending: Internal Medicine | Admitting: Internal Medicine

## 2013-05-26 ENCOUNTER — Emergency Department (HOSPITAL_COMMUNITY): Payer: Medicare Other

## 2013-05-26 ENCOUNTER — Inpatient Hospital Stay (HOSPITAL_COMMUNITY): Payer: Medicare Other

## 2013-05-26 ENCOUNTER — Encounter (HOSPITAL_COMMUNITY): Payer: Self-pay | Admitting: Emergency Medicine

## 2013-05-26 DIAGNOSIS — R29810 Facial weakness: Secondary | ICD-10-CM | POA: Diagnosis present

## 2013-05-26 DIAGNOSIS — F329 Major depressive disorder, single episode, unspecified: Secondary | ICD-10-CM | POA: Diagnosis present

## 2013-05-26 DIAGNOSIS — IMO0002 Reserved for concepts with insufficient information to code with codable children: Secondary | ICD-10-CM

## 2013-05-26 DIAGNOSIS — R4789 Other speech disturbances: Secondary | ICD-10-CM | POA: Diagnosis present

## 2013-05-26 DIAGNOSIS — Z79899 Other long term (current) drug therapy: Secondary | ICD-10-CM

## 2013-05-26 DIAGNOSIS — R42 Dizziness and giddiness: Secondary | ICD-10-CM | POA: Diagnosis present

## 2013-05-26 DIAGNOSIS — K219 Gastro-esophageal reflux disease without esophagitis: Secondary | ICD-10-CM | POA: Diagnosis present

## 2013-05-26 DIAGNOSIS — I10 Essential (primary) hypertension: Secondary | ICD-10-CM

## 2013-05-26 DIAGNOSIS — G459 Transient cerebral ischemic attack, unspecified: Secondary | ICD-10-CM

## 2013-05-26 DIAGNOSIS — E871 Hypo-osmolality and hyponatremia: Secondary | ICD-10-CM

## 2013-05-26 DIAGNOSIS — R209 Unspecified disturbances of skin sensation: Secondary | ICD-10-CM | POA: Diagnosis present

## 2013-05-26 DIAGNOSIS — G2581 Restless legs syndrome: Secondary | ICD-10-CM

## 2013-05-26 DIAGNOSIS — I129 Hypertensive chronic kidney disease with stage 1 through stage 4 chronic kidney disease, or unspecified chronic kidney disease: Secondary | ICD-10-CM | POA: Diagnosis present

## 2013-05-26 DIAGNOSIS — F3289 Other specified depressive episodes: Secondary | ICD-10-CM | POA: Diagnosis present

## 2013-05-26 DIAGNOSIS — Z96659 Presence of unspecified artificial knee joint: Secondary | ICD-10-CM

## 2013-05-26 DIAGNOSIS — M792 Neuralgia and neuritis, unspecified: Secondary | ICD-10-CM

## 2013-05-26 DIAGNOSIS — Z886 Allergy status to analgesic agent status: Secondary | ICD-10-CM

## 2013-05-26 DIAGNOSIS — G589 Mononeuropathy, unspecified: Secondary | ICD-10-CM | POA: Diagnosis present

## 2013-05-26 DIAGNOSIS — Z880 Allergy status to penicillin: Secondary | ICD-10-CM

## 2013-05-26 DIAGNOSIS — N189 Chronic kidney disease, unspecified: Secondary | ICD-10-CM | POA: Diagnosis present

## 2013-05-26 DIAGNOSIS — Z9089 Acquired absence of other organs: Secondary | ICD-10-CM

## 2013-05-26 DIAGNOSIS — G458 Other transient cerebral ischemic attacks and related syndromes: Principal | ICD-10-CM | POA: Diagnosis present

## 2013-05-26 LAB — CBC
HCT: 43.1 % (ref 36.0–46.0)
HCT: 42.5 % (ref 36.0–46.0)
Hemoglobin: 15.2 g/dL — ABNORMAL HIGH (ref 12.0–15.0)
MCH: 30.5 pg (ref 26.0–34.0)
MCH: 30.6 pg (ref 26.0–34.0)
MCHC: 35.8 g/dL (ref 30.0–36.0)
MCV: 86 fL (ref 78.0–100.0)
RBC: 5.01 MIL/uL (ref 3.87–5.11)
RDW: 15.1 % (ref 11.5–15.5)
WBC: 6.1 10*3/uL (ref 4.0–10.5)

## 2013-05-26 LAB — COMPREHENSIVE METABOLIC PANEL
ALT: 14 U/L (ref 0–35)
AST: 24 U/L (ref 0–37)
Albumin: 4.1 g/dL (ref 3.5–5.2)
Alkaline Phosphatase: 87 U/L (ref 39–117)
Calcium: 9.2 mg/dL (ref 8.4–10.5)
Chloride: 97 mEq/L (ref 96–112)
Sodium: 133 mEq/L — ABNORMAL LOW (ref 135–145)
Total Protein: 7.6 g/dL (ref 6.0–8.3)

## 2013-05-26 LAB — RAPID URINE DRUG SCREEN, HOSP PERFORMED
Barbiturates: NOT DETECTED
Cocaine: NOT DETECTED
Opiates: NOT DETECTED

## 2013-05-26 LAB — DIFFERENTIAL
Basophils Absolute: 0 10*3/uL (ref 0.0–0.1)
Eosinophils Absolute: 0.2 10*3/uL (ref 0.0–0.7)
Eosinophils Relative: 4 % (ref 0–5)
Lymphocytes Relative: 22 % (ref 12–46)
Lymphs Abs: 1.3 10*3/uL (ref 0.7–4.0)
Neutro Abs: 3.6 10*3/uL (ref 1.7–7.7)
Neutrophils Relative %: 62 % (ref 43–77)

## 2013-05-26 LAB — APTT: aPTT: 30 seconds (ref 24–37)

## 2013-05-26 LAB — GLUCOSE, CAPILLARY

## 2013-05-26 LAB — CREATININE, SERUM
GFR calc Af Amer: 58 mL/min — ABNORMAL LOW (ref >90)
GFR calc non Af Amer: 50 mL/min — ABNORMAL LOW (ref >90)

## 2013-05-26 LAB — PROTIME-INR
INR: 1.06 (ref 0.00–1.49)
Prothrombin Time: 13.6 seconds (ref 11.6–15.2)

## 2013-05-26 LAB — TROPONIN I: Troponin I: <0.3 ng/mL (ref <0.30)

## 2013-05-26 MED ORDER — PANTOPRAZOLE SODIUM 40 MG PO TBEC
40.0000 mg | DELAYED_RELEASE_TABLET | Freq: Every day | ORAL | Status: DC
  Administered 2013-05-27: 40 mg via ORAL
  Filled 2013-05-26: qty 1

## 2013-05-26 MED ORDER — ESTRADIOL 1 MG PO TABS
1.0000 mg | ORAL_TABLET | Freq: Every day | ORAL | Status: DC
  Administered 2013-05-27: 1 mg via ORAL
  Filled 2013-05-26 (×2): qty 1

## 2013-05-26 MED ORDER — ACEBUTOLOL HCL 400 MG PO CAPS
400.0000 mg | ORAL_CAPSULE | Freq: Every day | ORAL | Status: DC
  Administered 2013-05-27: 400 mg via ORAL
  Filled 2013-05-26 (×2): qty 1

## 2013-05-26 MED ORDER — GABAPENTIN 300 MG PO CAPS
600.0000 mg | ORAL_CAPSULE | Freq: Every day | ORAL | Status: DC
  Administered 2013-05-26 – 2013-05-27 (×2): 600 mg via ORAL
  Filled 2013-05-26 (×2): qty 2

## 2013-05-26 MED ORDER — FLUTICASONE PROPIONATE 50 MCG/ACT NA SUSP
1.0000 | Freq: Every day | NASAL | Status: DC
  Administered 2013-05-27: 1 via NASAL
  Filled 2013-05-26: qty 16

## 2013-05-26 MED ORDER — DOXYCYCLINE HYCLATE 100 MG PO TABS
100.0000 mg | ORAL_TABLET | Freq: Two times a day (BID) | ORAL | Status: DC
  Administered 2013-05-26 – 2013-05-27 (×2): 100 mg via ORAL
  Filled 2013-05-26 (×3): qty 1

## 2013-05-26 MED ORDER — DOXYCYCLINE HYCLATE 100 MG PO CAPS: 100.0000 mg | ORAL_CAPSULE | Freq: Two times a day (BID) | ORAL | Status: DC

## 2013-05-26 MED ORDER — ROPINIROLE HCL 1 MG PO TABS: 10.0000 mg | ORAL_TABLET | Freq: Every day | ORAL | Status: DC

## 2013-05-26 MED ORDER — ENOXAPARIN SODIUM 40 MG/0.4ML ~~LOC~~ SOLN
40.0000 mg | SUBCUTANEOUS | Status: DC
  Administered 2013-05-26: 40 mg via SUBCUTANEOUS
  Filled 2013-05-26 (×2): qty 0.4

## 2013-05-26 MED ORDER — ROPINIROLE HCL 1 MG PO TABS
1.0000 mg | ORAL_TABLET | Freq: Every day | ORAL | Status: DC
  Administered 2013-05-26: 1 mg via ORAL
  Filled 2013-05-26 (×2): qty 1

## 2013-05-26 MED ORDER — ACETAMINOPHEN 500 MG PO TABS
500.0000 mg | ORAL_TABLET | Freq: Four times a day (QID) | ORAL | Status: DC | PRN
  Administered 2013-05-27: 500 mg via ORAL
  Filled 2013-05-26: qty 1

## 2013-05-26 MED ORDER — HYDRALAZINE HCL 20 MG/ML IJ SOLN: 5.0000 mg | INTRAMUSCULAR | Status: DC | PRN

## 2013-05-26 MED ORDER — HYDRALAZINE HCL 20 MG/ML IJ SOLN
5.0000 mg | Freq: Once | INTRAMUSCULAR | Status: AC
  Administered 2013-05-26: 21:00:00 via INTRAVENOUS
  Filled 2013-05-26: qty 1

## 2013-05-26 MED ORDER — AMLODIPINE BESYLATE 5 MG PO TABS
5.0000 mg | ORAL_TABLET | Freq: Every day | ORAL | Status: DC
  Filled 2013-05-26: qty 1

## 2013-05-26 NOTE — ED Provider Notes (Addendum)
CSN: [id number]     Arrival date & time 05/26/13  1636 History   First MD Initiated Contact with Patient 05/26/13 1658     Chief Complaint  Patient presents with  . Hypertension  . Transient Ischemic Attack   (Consider location/radiation/quality/duration/timing/severity/associated sxs/prior Treatment) Patient is a 79 y.o. female presenting with weakness. The history is provided by the patient.  Weakness This is a new problem. The current episode started 3 to 5 hours ago. The problem occurs constantly. The problem has been resolved. Pertinent negatives include no shortness of breath. Associated symptoms comments: Noticed today around 12 that she had right arm tingling and numbness that moved into her right face with facial droop and slurred speech.  Lasted appx 31min-1hr and resolved.  No SOB, headache, CP, visual problems or gait/swallowing problems.. Nothing aggravates the symptoms. Nothing relieves the symptoms. She has tried nothing for the symptoms. The treatment provided significant relief.    Past Medical History  Diagnosis Date  . Hypertension   . Dysrhythmia 8/13    palpitations/ now resolved  . Depression   . GERD (gastroesophageal reflux disease)   . Restless legs syndrome   . Arthritis   . History of blood transfusion 2012  . Peripheral vascular disease     varicose veins bilaterally   . Chronic kidney disease    Past Surgical History  Procedure Laterality Date  . Joint replacement Right     knee  . Abdominal hysterectomy    . Eye surgery Bilateral     cataract extraction with IOL  . Appendectomy    . Varicose vein surgery Bilateral   . Septoplasty      repair deviated septum  . Total knee arthroplasty Left 11/07/2012    Procedure: LEFT TOTAL KNEE ARTHROPLASTY;  Surgeon: Loanne Drilling, MD;  Location: [facility];  Service: Orthopedics;  Laterality: Left;   No family history on file. History  Substance Use Topics  . Smoking status: Never Smoker   . Smokeless  tobacco: Never Used  . Alcohol Use: No   OB History   Grav Para Term Preterm Abortions TAB SAB Ect Mult Living                 Review of Systems  Constitutional: Negative for fever.  HENT:       Recent sinus infection on doxycycline  Respiratory: Negative for cough and shortness of breath.   Neurological: Positive for weakness.  All other systems reviewed and are negative.    Allergies  Penicillins and Aspirin  Home Medications   Current Outpatient Rx  Name  Route  Sig  Dispense  Refill  . acebutolol (SECTRAL) 400 MG capsule   Oral   Take 400 mg by mouth daily after breakfast.          . acetaminophen (TYLENOL) 500 MG tablet   Oral   Take 500 mg by mouth every 6 (six) hours as needed for pain.         Marland Kitchen amLODipine (NORVASC) 5 MG tablet   Oral   Take 5 mg by mouth daily after breakfast.          . cetirizine (ZYRTEC) 10 MG tablet   Oral   Take 10 mg by mouth daily.         Maxwell Caul Bicarbonate (ZEGERID) 20-1100 MG CAPS   Oral   Take 1 capsule by mouth at bedtime.          . pantoprazole (PROTONIX) 40  MG tablet   Oral   Take 40 mg by mouth daily.         . ropinirole (REQUIP) 5 MG tablet   Oral   Take 10 mg by mouth at bedtime.          . traMADol (ULTRAM) 50 MG tablet   Oral   Take 1-2 tablets (50-100 mg total) by mouth every 6 (six) hours as needed.   60 tablet   0    BP 194/109  Pulse 68  Temp(Src) 97.6 F (36.4 C) (Oral)  Resp 18  Ht 5\' 7"  (1.702 m)  Wt 171 lb 12.8 oz (77.928 kg)  BMI 26.90 kg/m2  SpO2 96% Physical Exam  Nursing note and vitals reviewed. Constitutional: She is oriented to person, place, and time. She appears well-developed and well-nourished. No distress.  HENT:  Head: Normocephalic and atraumatic.  Mouth/Throat: Oropharynx is clear and moist.  Eyes: Conjunctivae and EOM are normal. Pupils are equal, round, and reactive to light.  Neck: Normal range of motion. Neck supple.  Cardiovascular: Normal  rate, regular rhythm and intact distal pulses.   No murmur heard. Pulmonary/Chest: Effort normal and breath sounds normal. No respiratory distress. She has no wheezes. She has no rales.  Abdominal: Soft. She exhibits no distension. There is no tenderness. There is no rebound and no guarding.  Musculoskeletal: Normal range of motion. She exhibits no edema and no tenderness.  Neurological: She is alert and oriented to person, place, and time. She has normal strength. No cranial nerve deficit or sensory deficit. Coordination normal.  Normal finger to nose.  No visual field cuts.  Normal speech.  No appreciable facial droop  Skin: Skin is warm and dry. No rash noted. No erythema.  Psychiatric: She has a normal mood and affect. Her behavior is normal.    ED Course  Procedures (including critical care time) Labs Review Labs Reviewed  CBC - Abnormal; Notable for the following:    Hemoglobin 15.3 (*)    All other components within normal limits  DIFFERENTIAL - Abnormal; Notable for the following:    Monocytes Relative 13 (*)    All other components within normal limits  COMPREHENSIVE METABOLIC PANEL - Abnormal; Notable for the following:    Sodium 133 (*)    Glucose, Bld 111 (*)    GFR calc non Af Amer 50 (*)    GFR calc Af Amer 58 (*)    All other components within normal limits  GLUCOSE, CAPILLARY - Abnormal; Notable for the following:    Glucose-Capillary 105 (*)    All other components within normal limits  PROTIME-INR  APTT  TROPONIN I   Imaging Review Ct Head (brain) Wo Contrast  05/26/2013   CLINICAL DATA:  Hypertension, transient ischemic attack  EXAM: CT HEAD WITHOUT CONTRAST  TECHNIQUE: Contiguous axial images were obtained from the base of the skull through the vertex without intravenous contrast.  COMPARISON:  MR HEAD WO/W CM dated 03/12/2008  FINDINGS: No acute intracranial hemorrhage. No focal mass lesion. No CT evidence of acute infarction. No midline shift or mass effect.  No hydrocephalus. Basilar cisterns are patent. There is mild periventricular white matter hypodensities. Dystrophic calcifications in the basal ganglia.  Paranasal sinuses and mastoid air cells are clear.  IMPRESSION: 1. No acute intracranial findings. 2. Mild white matter microvascular disease.   Electronically Signed   By: Genevive Bi M.D.   On: 05/26/2013 17:24    EKG Interpretation  Ventricular Rate:  70 PR Interval:  182 QRS Duration: 70 QT Interval:  422 QTC Calculation: 455 R Axis:   61 Text Interpretation:  Normal sinus rhythm Normal ECG No significant change since last tracing            MDM   1. TIA (transient ischemic attack)   2. Hypertension     Patient presenting with TIA symptoms that have now resolved. And lasted approximately 30 minutes to an hour today involving right-sided facial numbness and right arm numbness with some slurred speech and facial droop. Now all symptoms have been completely resolved for several hours. Patient has been hypertensive today as well despite taking her normal medications.  Patient has a normal exam now and initial labs and EKG are within normal limits. Head CT without acute findings. Feel patient needs workup for TIA    Gwyneth Sprout, MD 05/26/13 1744  Gwyneth Sprout, MD 05/26/13 [phone]

## 2013-05-26 NOTE — H&P (Signed)
[facility] History and Physical  LESHONDA GALAMBOS JWJ:[id number] DOB: 12/04/1927 DOA: 05/26/2013  Referring physician:  PCP: Lenora Boys, MD  Specialists:   Chief Complaint: Dizziness, right hand paresthesia, right face numbness  HPI: EVETTA RENNER 79 yo WF PMHx S/P bilateral TKA (April 2014 right knee), dates approximately 1200 positive dizziness, right hand paresthesia, right face paresthesia with facial droop, slurred speech. States pain usually resolved after 30 minutes. Patient was transported to Dr. Titus Mould office by daughter where BP found to be 199/104. Patient instructed to proceed to [facility]. ED. The problem has resolved. CURRENTLY no neurologic symptoms. State he did have some SOB during above episode      Review of Systems: The patient denies anorexia, fever, weight loss,, vision loss, decreased hearing, hoarseness, chest pain, syncope, dyspnea on exertion, peripheral edema, balance deficits, hemoptysis, abdominal pain, melena, hematochezia, severe indigestion/heartburn, hematuria, incontinence, genital sores, muscle weakness, suspicious skin lesions, transient blindness, difficulty walking, depression, unusual weight change, abnormal bleeding, enlarged lymph nodes, angioedema, and breast masses.    TRAVEL HISTORY:  None  Past Medical History  Diagnosis Date  . Hypertension   . Dysrhythmia 8/13    palpitations/ now resolved  . Depression   . GERD (gastroesophageal reflux disease)   . Restless legs syndrome   . Arthritis   . History of blood transfusion 2012  . Peripheral vascular disease     varicose veins bilaterally   . Chronic kidney disease    Past Surgical History  Procedure Laterality Date  . Joint replacement Right     knee  . Abdominal hysterectomy    . Eye surgery Bilateral     cataract extraction with IOL  . Appendectomy    . Varicose vein surgery Bilateral   . Septoplasty      repair deviated septum  . Total knee arthroplasty Left 11/07/2012     Procedure: LEFT TOTAL KNEE ARTHROPLASTY;  Surgeon: Loanne Drilling, MD;  Location: [facility];  Service: Orthopedics;  Laterality: Left;   Social History:  reports that she has never smoked. She has never used smokeless tobacco. She reports that she does not drink alcohol or use illicit drugs.    Allergies  Allergen Reactions  . Penicillins Hives and Itching  . Aspirin Palpitations    In high doses    No family history on file.    Prior to Admission medications   Medication Sig Start Date End Date Taking? Authorizing Provider  acebutolol (SECTRAL) 400 MG capsule Take 400 mg by mouth daily after breakfast.    Yes Historical Provider, MD  acetaminophen (TYLENOL) 500 MG tablet Take 500 mg by mouth every 6 (six) hours as needed for pain.   Yes Historical Provider, MD  amLODipine (NORVASC) 5 MG tablet Take 5 mg by mouth daily after breakfast.    Yes Historical Provider, MD  cetirizine (ZYRTEC) 10 MG tablet Take 10 mg by mouth daily.   Yes Historical Provider, MD  doxycycline (VIBRAMYCIN) 100 MG capsule Take 100 mg by mouth 2 (two) times daily. For 10 days. Started on 05-25-13   Yes Historical Provider, MD  estradiol (ESTRACE) 1 MG tablet Take 1 mg by mouth daily.   Yes Historical Provider, MD  fluticasone (FLONASE) 50 MCG/ACT nasal spray Place 1 spray into both nostrils daily.   Yes Historical Provider, MD  gabapentin (NEURONTIN) 300 MG capsule Take 600 mg by mouth daily.   Yes Historical Provider, MD  Omeprazole-Sodium Bicarbonate (ZEGERID) 20-1100 MG CAPS Take 1  capsule by mouth at bedtime.    Yes Historical Provider, MD  pantoprazole (PROTONIX) 40 MG tablet Take 40 mg by mouth daily.   Yes Historical Provider, MD  ropinirole (REQUIP) 5 MG tablet Take 10 mg by mouth at bedtime.    Yes Historical Provider, MD   Physical Exam: Filed Vitals:   05/26/13 1807 05/26/13 1808 05/26/13 1815 05/26/13 2015  BP: 192/90  170/72 195/96  Pulse: 73  70 73  Temp:  97.8 F (36.6 C)  97.6 F (36.4 C)   TempSrc:    Oral  Resp:   16 18  Height:      Weight:      SpO2:   99% 100%     General: A./O. x4, NAD  Eyes: Equal round reactive to light and accommodate  Neck: Negative JVD  Cardiovascular: Regular rhythm and rate, negative murmurs rubs gallops, DP/PT pulse one plus bilateral  Respiratory: Good auscultation bilateral  Abdomen: Soft, nontender, nondistended, plus bowel sounds  Skin: Soft, warm to touch, negative or a  Musculoskeletal: Mild bilateral pedal edema 1+Lt > Rt  Neurologic: Cranial nerves II through XII intact, all extremities strength 5/5, sensation intact throughout, tongue/uvula midline, negative Romberg, negative pronator drift, quick touch fingers within normal limits, finger to nose to finger within normal limits bilateral, ambulation within normal limit except for mild dragging right leg secondary to recent knee replacement  Labs on Admission:  Basic Metabolic Panel:  Recent Labs Lab 05/26/13 1650  NA 133*  K 4.5  CL 97  CO2 26  GLUCOSE 111*  BUN 18  CREATININE 1.01  CALCIUM 9.2   Liver Function Tests:  Recent Labs Lab 05/26/13 1650  AST 24  ALT 14  ALKPHOS 87  BILITOT 0.4  PROT 7.6  ALBUMIN 4.1   No results found for this basename: LIPASE, AMYLASE,  in the last 168 hours No results found for this basename: AMMONIA,  in the last 168 hours CBC:  Recent Labs Lab 05/26/13 1650  WBC 5.8  NEUTROABS 3.6  HGB 15.3*  HCT 43.1  MCV 86.0  PLT 205   Cardiac Enzymes:  Recent Labs Lab 05/26/13 1650  TROPONINI <0.30    BNP (last 3 results) No results found for this basename: PROBNP,  in the last 8760 hours CBG:  Recent Labs Lab 05/26/13 1722  GLUCAP 105*    Radiological Exams on Admission: Ct Head (brain) Wo Contrast  05/26/2013   CLINICAL DATA:  Hypertension, transient ischemic attack  EXAM: CT HEAD WITHOUT CONTRAST  TECHNIQUE: Contiguous axial images were obtained from the base of the skull through the vertex without  intravenous contrast.  COMPARISON:  MR HEAD WO/W CM dated 03/12/2008  FINDINGS: No acute intracranial hemorrhage. No focal mass lesion. No CT evidence of acute infarction. No midline shift or mass effect. No hydrocephalus. Basilar cisterns are patent. There is mild periventricular white matter hypodensities. Dystrophic calcifications in the basal ganglia.  Paranasal sinuses and mastoid air cells are clear.  IMPRESSION: 1. No acute intracranial findings. 2. Mild white matter microvascular disease.   Electronically Signed   By: Genevive Bi M.D.   On: 05/26/2013 17:24   Mr Angiogram Head Wo Contrast  05/26/2013   CLINICAL DATA:  Generalized weakness  EXAM: MRI HEAD WITHOUT CONTRAST  MRA HEAD WITHOUT CONTRAST  TECHNIQUE: Multiplanar, multiecho pulse sequences of the brain and surrounding structures were obtained without intravenous contrast. Angiographic images of the head were obtained using MRA technique without contrast.  COMPARISON:  Head CT same day.  MRI 03/12/2008.  FINDINGS: MRI HEAD FINDINGS  Diffusion imaging does not show any acute or subacute infarction. There are mild chronic small-vessel changes within the pons. No focal cerebellar insult. The cerebral hemispheres show age related atrophy with mild chronic small-vessel change of the white matter. No cortical or large vessel territory infarction. No mass lesion, hemorrhage, hydrocephalus or extra-axial collection. No pituitary mass. There are inflammatory changes of the right division of the sphenoid sinus which are chronic.  MRA HEAD FINDINGS  Both internal carotid arteries are widely patent into the brain. No siphon stenosis. The anterior and middle cerebral vessels are patent without proximal stenosis, aneurysm or vascular malformation.  Both vertebral arteries are patent to the basilar. The left vertebral artery largely terminates in plica but gives a small contribution to the basilar. The right vertebral artery supplies most of the basilar. No  basilar stenosis. Posterior circulation branch vessels are patent. Right PCA receives most of its supply from the anterior circulation.  IMPRESSION: No acute infarction. Age related atrophy and mild chronic small vessel disease.  No significant pathologic finding of the large or medium size vessels.  Chronic right sphenoid sinus inflammation.   Electronically Signed   By: Paulina Fusi M.D.   On: 05/26/2013 20:10   Mr Brain Wo Contrast  05/26/2013   CLINICAL DATA:  Generalized weakness  EXAM: MRI HEAD WITHOUT CONTRAST  MRA HEAD WITHOUT CONTRAST  TECHNIQUE: Multiplanar, multiecho pulse sequences of the brain and surrounding structures were obtained without intravenous contrast. Angiographic images of the head were obtained using MRA technique without contrast.  COMPARISON:  Head CT same day.  MRI 03/12/2008.  FINDINGS: MRI HEAD FINDINGS  Diffusion imaging does not show any acute or subacute infarction. There are mild chronic small-vessel changes within the pons. No focal cerebellar insult. The cerebral hemispheres show age related atrophy with mild chronic small-vessel change of the white matter. No cortical or large vessel territory infarction. No mass lesion, hemorrhage, hydrocephalus or extra-axial collection. No pituitary mass. There are inflammatory changes of the right division of the sphenoid sinus which are chronic.  MRA HEAD FINDINGS  Both internal carotid arteries are widely patent into the brain. No siphon stenosis. The anterior and middle cerebral vessels are patent without proximal stenosis, aneurysm or vascular malformation.  Both vertebral arteries are patent to the basilar. The left vertebral artery largely terminates in plica but gives a small contribution to the basilar. The right vertebral artery supplies most of the basilar. No basilar stenosis. Posterior circulation branch vessels are patent. Right PCA receives most of its supply from the anterior circulation.  IMPRESSION: No acute infarction.  Age related atrophy and mild chronic small vessel disease.  No significant pathologic finding of the large or medium size vessels.  Chronic right sphenoid sinus inflammation.   Electronically Signed   By: Paulina Fusi M.D.   On: 05/26/2013 20:10    EKG: NSR  Assessment/Plan Active Problems:   * No active hospital problems. *  TIA -Obtain MRI/MRA evaluate for CVA -Obtain carotid Doppler  HTN malignant -The patient's home medication amlodipine,Acebutolol -IV hydralazine for SBP>165  or DBP > 100 -Obtain troponin x3 -Obtain echocardiogram  Hyponatremia -No action at this time, monitor  Neuropathic pain -Continue Neurontin home dose  Restless leg syndrome -Continue Requip at home dose     Code Status: Full Family Communication: Family present for discussion of plan of care Disposition Plan:   Time spent: 90 minutes  Aariona Momon,  Roselind Messier [facility] Pager [phone]  If 7PM-7AM, please contact night-coverage [email or web address] Password [facility] 05/26/2013, 8:28 PM

## 2013-05-26 NOTE — ED Notes (Addendum)
Pt was sent by her pcp for htn and possible tia.  Around 12pm pt noticed her R hand was numb and the R side of her face became numb.  Her bp was 199/102.  Now 194/109. All s/s have resolved.  Pt is ao x 4.

## 2013-05-27 DIAGNOSIS — G459 Transient cerebral ischemic attack, unspecified: Secondary | ICD-10-CM

## 2013-05-27 LAB — LIPID PANEL
Cholesterol: 143 mg/dL (ref 0–200)
HDL: 74 mg/dL (ref >39)
Triglycerides: 84 mg/dL (ref <150)

## 2013-05-27 LAB — HEMOGLOBIN A1C
Hgb A1c MFr Bld: 5.6 % (ref <5.7)
Mean Plasma Glucose: 114 mg/dL (ref <117)

## 2013-05-27 MED ORDER — ASPIRIN 81 MG PO TBEC: 81.0000 mg | DELAYED_RELEASE_TABLET | Freq: Every day | ORAL | Status: DC

## 2013-05-27 MED ORDER — ASPIRIN EC 81 MG PO TBEC
81.0000 mg | DELAYED_RELEASE_TABLET | Freq: Every day | ORAL | Status: DC
  Filled 2013-05-27: qty 1

## 2013-05-27 NOTE — Discharge Summary (Signed)
Physician Discharge Summary  [name] ZOX:[id number] DOB: 06-19-1928 DOA: 05/26/2013  PCP: Lenora Boys, MD  Admit date: 05/26/2013 Discharge date: 05/27/2013  Time spent:  Recommendations for Outpatient Follow-up:   TIA -All patient's neurologic signs and symptoms have resolved the patient back to baseline  -Start patient back on aspirin a 81mg  daily (has taken in the past) -Consultation her echocardiogram reading had not returned yet. Patient states understands but would like to be discharged patient to followup with PCP within one week and evaluate all results from hospital  HTN -Now within [facility] guidelines -Discharge on current medication -Followup with PCP in one week  Hyponatremia  -Mildly hyponatremic, asymptomatic -Followup with PCP  Neuropathic pain -Continue on home medication regimen  Restless leg syndrome -Continue Requip   Discharge Diagnoses:  Principal Problem:   TIA (transient ischemic attack) Active Problems:   HTN (hypertension), malignant   Hyponatremia   Neuropathic pain   Restless leg syndrome   Discharge Condition: Stable  Diet recommendation: Heart healthy  Filed Weights   05/26/13 1644  Weight: 77.928 kg (171 lb 12.8 oz)    History of present illness:  [name] 79 yo WF PMHx S/P bilateral TKA (April 2014 right knee), dates approximately 1200 positive dizziness, right hand paresthesia, right face paresthesia with facial droop, slurred speech. States pain usually resolved after 30 minutes. Patient was transported to Dr. Titus Mould office by daughter where BP found to be 199/104. Patient instructed to proceed to [facility] ED. The problem has resolved. CURRENTLY no neurologic symptoms. State he did have some SOB during above episode. TODAY patient states feeling well, all neurologic symptoms have resolved, sitting in chair comfortably, asked to be discharged home if possible     Procedures: MRI/MRA 05/26/2013 IMPRESSION:   No acute infarction. Age related atrophy and mild chronic small  vessel disease.  No significant pathologic finding of the large or medium size  vessels.  Chronic right sphenoid sinus inflammation.   Consultations:  Discharge Exam: Filed Vitals:   05/27/13 0206 05/27/13 0527 05/27/13 0916 05/27/13 1408  BP: 131/65 112/73 118/60 132/59  Pulse: 81 87 75 74  Temp: 98.3 F (36.8 C) 97.9 F (36.6 C) 97.7 F (36.5 C) 97.7 F (36.5 C)  TempSrc: Oral Oral Oral Oral  Resp: 18 20 20 20   Height:      Weight:      SpO2: 96% 98% 97% 98%   General: A./O. x4, NAD  Cardiovascular: Regular rhythm and rate, negative murmurs rubs gallops, DP/PT pulse one plus bilateral  Respiratory: Good auscultation bilateral  Abdomen: Soft, nontender, nondistended, plus bowel sounds  Skin: Soft, warm to touch, negative or a  Musculoskeletal: Mild bilateral pedal edema 1+Lt > Rt  Neurologic: Cranial nerves II through XII intact, all extremities strength 5/5, sensation intact throughout, tongue/uvula midline, negative Romberg, negative pronator drift, quick touch fingers within normal limits, finger to nose to finger within normal limits bilateral, ambulation within normal limit except for mild dragging right leg secondary to recent knee replacement   Discharge Instructions     Medication List    ASK your doctor about these medications       acebutolol 400 MG capsule  Commonly known as:  SECTRAL  Take 400 mg by mouth daily after breakfast.     acetaminophen 500 MG tablet  Commonly known as:  TYLENOL  Take 500 mg by mouth every 6 (six) hours as needed for pain.     amLODipine 5 MG tablet  Commonly known as:  NORVASC  Take 5 mg by mouth daily after breakfast.     cetirizine 10 MG tablet  Commonly known as:  ZYRTEC  Take 10 mg by mouth daily.     doxycycline 100 MG capsule  Commonly known as:  VIBRAMYCIN  Take 100 mg by mouth 2 (two) times daily. For 10 days. Started on 05-25-13      estradiol 1 MG tablet  Commonly known as:  ESTRACE  Take 1 mg by mouth daily.     fluticasone 50 MCG/ACT nasal spray  Commonly known as:  FLONASE  Place 1 spray into both nostrils daily.     gabapentin 300 MG capsule  Commonly known as:  NEURONTIN  Take 600 mg by mouth daily.     Omeprazole-Sodium Bicarbonate 20-1100 MG Caps capsule  Commonly known as:  ZEGERID  Take 1 capsule by mouth at bedtime.     pantoprazole 40 MG tablet  Commonly known as:  PROTONIX  Take 40 mg by mouth daily.     rOPINIRole 0.5 MG tablet  Commonly known as:  REQUIP  Take 1 mg by mouth at bedtime.       Allergies  Allergen Reactions  . Penicillins Hives and Itching  . Aspirin Palpitations    In high doses      The results of significant diagnostics from this hospitalization (including imaging, microbiology, ancillary and laboratory) are listed below for reference.    Significant Diagnostic Studies: Ct Head (brain) Wo Contrast  05/26/2013   CLINICAL DATA:  Hypertension, transient ischemic attack  EXAM: CT HEAD WITHOUT CONTRAST  TECHNIQUE: Contiguous axial images were obtained from the base of the skull through the vertex without intravenous contrast.  COMPARISON:  MR HEAD WO/W CM dated 03/12/2008  FINDINGS: No acute intracranial hemorrhage. No focal mass lesion. No CT evidence of acute infarction. No midline shift or mass effect. No hydrocephalus. Basilar cisterns are patent. There is mild periventricular white matter hypodensities. Dystrophic calcifications in the basal ganglia.  Paranasal sinuses and mastoid air cells are clear.  IMPRESSION: 1. No acute intracranial findings. 2. Mild white matter microvascular disease.   Electronically Signed   By: Genevive Bi M.D.   On: 05/26/2013 17:24   Mr Angiogram Head Wo Contrast  05/26/2013   CLINICAL DATA:  Generalized weakness  EXAM: MRI HEAD WITHOUT CONTRAST  MRA HEAD WITHOUT CONTRAST  TECHNIQUE: Multiplanar, multiecho pulse sequences of the brain and  surrounding structures were obtained without intravenous contrast. Angiographic images of the head were obtained using MRA technique without contrast.  COMPARISON:  Head CT same day.  MRI 03/12/2008.  FINDINGS: MRI HEAD FINDINGS  Diffusion imaging does not show any acute or subacute infarction. There are mild chronic small-vessel changes within the pons. No focal cerebellar insult. The cerebral hemispheres show age related atrophy with mild chronic small-vessel change of the white matter. No cortical or large vessel territory infarction. No mass lesion, hemorrhage, hydrocephalus or extra-axial collection. No pituitary mass. There are inflammatory changes of the right division of the sphenoid sinus which are chronic.  MRA HEAD FINDINGS  Both internal carotid arteries are widely patent into the brain. No siphon stenosis. The anterior and middle cerebral vessels are patent without proximal stenosis, aneurysm or vascular malformation.  Both vertebral arteries are patent to the basilar. The left vertebral artery largely terminates in plica but gives a small contribution to the basilar. The right vertebral artery supplies most of the basilar. No basilar stenosis. Posterior circulation  branch vessels are patent. Right PCA receives most of its supply from the anterior circulation.  IMPRESSION: No acute infarction. Age related atrophy and mild chronic small vessel disease.  No significant pathologic finding of the large or medium size vessels.  Chronic right sphenoid sinus inflammation.   Electronically Signed   By: Paulina Fusi M.D.   On: 05/26/2013 20:10   Mr Brain Wo Contrast  05/26/2013   CLINICAL DATA:  Generalized weakness  EXAM: MRI HEAD WITHOUT CONTRAST  MRA HEAD WITHOUT CONTRAST  TECHNIQUE: Multiplanar, multiecho pulse sequences of the brain and surrounding structures were obtained without intravenous contrast. Angiographic images of the head were obtained using MRA technique without contrast.  COMPARISON:  Head  CT same day.  MRI 03/12/2008.  FINDINGS: MRI HEAD FINDINGS  Diffusion imaging does not show any acute or subacute infarction. There are mild chronic small-vessel changes within the pons. No focal cerebellar insult. The cerebral hemispheres show age related atrophy with mild chronic small-vessel change of the white matter. No cortical or large vessel territory infarction. No mass lesion, hemorrhage, hydrocephalus or extra-axial collection. No pituitary mass. There are inflammatory changes of the right division of the sphenoid sinus which are chronic.  MRA HEAD FINDINGS  Both internal carotid arteries are widely patent into the brain. No siphon stenosis. The anterior and middle cerebral vessels are patent without proximal stenosis, aneurysm or vascular malformation.  Both vertebral arteries are patent to the basilar. The left vertebral artery largely terminates in plica but gives a small contribution to the basilar. The right vertebral artery supplies most of the basilar. No basilar stenosis. Posterior circulation branch vessels are patent. Right PCA receives most of its supply from the anterior circulation.  IMPRESSION: No acute infarction. Age related atrophy and mild chronic small vessel disease.  No significant pathologic finding of the large or medium size vessels.  Chronic right sphenoid sinus inflammation.   Electronically Signed   By: Paulina Fusi M.D.   On: 05/26/2013 20:10    Microbiology: No results found for this or any previous visit (from the past 240 hour(s)).   Labs: Basic Metabolic Panel:  Recent Labs Lab 05/26/13 1650 05/26/13 2119  NA 133*  --   K 4.5  --   CL 97  --   CO2 26  --   GLUCOSE 111*  --   BUN 18  --   CREATININE 1.01 1.01  CALCIUM 9.2  --    Liver Function Tests:  Recent Labs Lab 05/26/13 1650  AST 24  ALT 14  ALKPHOS 87  BILITOT 0.4  PROT 7.6  ALBUMIN 4.1   No results found for this basename: LIPASE, AMYLASE,  in the last 168 hours No results found for  this basename: AMMONIA,  in the last 168 hours CBC:  Recent Labs Lab 05/26/13 1650 05/26/13 2119  WBC 5.8 6.1  NEUTROABS 3.6  --   HGB 15.3* 15.2*  HCT 43.1 42.5  MCV 86.0 85.5  PLT 205 206   Cardiac Enzymes:  Recent Labs Lab 05/26/13 1650  TROPONINI <0.30   BNP: BNP (last 3 results) No results found for this basename: PROBNP,  in the last 8760 hours CBG:  Recent Labs Lab 05/26/13 1722  GLUCAP 105*       Signed:  Carolyne Littles, MD [facility] [phone] pager

## 2013-05-27 NOTE — Progress Notes (Signed)
  Echocardiogram 2D Echocardiogram has been performed.  [name] 05/27/2013, 10:43 AM

## 2013-05-27 NOTE — Progress Notes (Signed)
*  PRELIMINARY RESULTS* Vascular Ultrasound Carotid Duplex (Doppler) has been completed.   Findings suggest 1-39% internal carotid artery stenosis bilaterally. Vertebral arteries are patent with antegrade flow.  05/27/2013 11:31 AM Gertie Fey, RVT, RDCS, RDMS

## 2013-10-19 ENCOUNTER — Encounter: Payer: Self-pay | Admitting: *Deleted

## 2013-11-23 ENCOUNTER — Ambulatory Visit (INDEPENDENT_AMBULATORY_CARE_PROVIDER_SITE_OTHER): Payer: Medicare Other

## 2013-11-23 ENCOUNTER — Other Ambulatory Visit: Payer: Self-pay | Admitting: Orthopedic Surgery

## 2013-11-23 DIAGNOSIS — Z96659 Presence of unspecified artificial knee joint: Secondary | ICD-10-CM

## 2013-11-23 DIAGNOSIS — Z96653 Presence of artificial knee joint, bilateral: Secondary | ICD-10-CM

## 2014-05-28 ENCOUNTER — Other Ambulatory Visit: Payer: Self-pay | Admitting: Family Medicine

## 2014-05-28 DIAGNOSIS — R1011 Right upper quadrant pain: Secondary | ICD-10-CM

## 2014-05-30 ENCOUNTER — Ambulatory Visit (INDEPENDENT_AMBULATORY_CARE_PROVIDER_SITE_OTHER): Payer: Medicare Other

## 2014-05-30 ENCOUNTER — Other Ambulatory Visit: Payer: Medicare Other

## 2014-05-30 DIAGNOSIS — R1011 Right upper quadrant pain: Secondary | ICD-10-CM

## 2014-05-30 DIAGNOSIS — R110 Nausea: Secondary | ICD-10-CM

## 2015-09-18 ENCOUNTER — Other Ambulatory Visit: Payer: Self-pay | Admitting: Family Medicine

## 2015-09-18 DIAGNOSIS — M545 Low back pain: Secondary | ICD-10-CM

## 2016-01-17 ENCOUNTER — Other Ambulatory Visit: Payer: Self-pay

## 2016-01-17 MED ORDER — GABAPENTIN 300 MG PO CAPS: 300.0000 mg | ORAL_CAPSULE | Freq: Two times a day (BID) | ORAL | Status: DC

## 2016-02-06 ENCOUNTER — Ambulatory Visit
Admission: RE | Admit: 2016-02-06 | Discharge: 2016-02-06 | Disposition: A | Discharge status: Home / Self Care | Payer: Medicare Other | Source: Ambulatory Visit | Attending: Cardiology | Admitting: Cardiology

## 2016-02-06 ENCOUNTER — Other Ambulatory Visit: Payer: Self-pay | Admitting: Cardiology

## 2016-02-06 DIAGNOSIS — I480 Paroxysmal atrial fibrillation: Secondary | ICD-10-CM

## 2016-11-12 ENCOUNTER — Other Ambulatory Visit: Payer: Self-pay | Admitting: Podiatry

## 2016-11-12 ENCOUNTER — Encounter: Payer: Self-pay | Admitting: Podiatry

## 2016-11-12 ENCOUNTER — Ambulatory Visit (INDEPENDENT_AMBULATORY_CARE_PROVIDER_SITE_OTHER): Payer: Medicare Other | Admitting: Podiatry

## 2016-11-12 ENCOUNTER — Ambulatory Visit (INDEPENDENT_AMBULATORY_CARE_PROVIDER_SITE_OTHER): Payer: Medicare Other

## 2016-11-12 DIAGNOSIS — S9032XA Contusion of left foot, initial encounter: Secondary | ICD-10-CM | POA: Diagnosis not present

## 2016-11-12 DIAGNOSIS — M779 Enthesopathy, unspecified: Secondary | ICD-10-CM

## 2016-11-12 NOTE — Progress Notes (Signed)
She presents today with chief complaint of pain and a nodule to the dorsomedial aspect of the right foot. She states that is stepped on for 6 weeks ago and caused considerable pain she states that she cannot stand for anything to touch the area she states has been swollen since that time. She's gone as far as cutting the side out of her shoes to alleviate her symptoms.  Objective: Vital signs are stable alert and oriented 3. Pulses are palpable. She has pain on palpation to this area. Her tibialis anterior tendon inserts right in this area and appears to be thinned considerably when compared to the contralateral tendon she also has some tenderness with some nodularity just proximal to its insertion site as well this very well could be a or a partial transverse tear. Radiographs do not demonstrate any type of osseus abnormalities in this area.  Assessment: Tear of the tibialis anterior tendon.  Plan: I injected the area today with Kenalog and local anesthetic to alleviate her symptoms follow up with her in a couple weeks.

## 2016-12-10 ENCOUNTER — Ambulatory Visit: Payer: Medicare Other | Admitting: Podiatry

## 2017-01-25 ENCOUNTER — Ambulatory Visit
Admission: RE | Admit: 2017-01-25 | Discharge: 2017-01-25 | Disposition: A | Discharge status: Home / Self Care | Payer: Medicare Other | Source: Ambulatory Visit | Attending: Cardiology | Admitting: Cardiology

## 2017-01-25 ENCOUNTER — Other Ambulatory Visit: Payer: Self-pay | Admitting: Cardiology

## 2017-01-25 DIAGNOSIS — I480 Paroxysmal atrial fibrillation: Secondary | ICD-10-CM

## 2017-05-21 ENCOUNTER — Encounter (HOSPITAL_COMMUNITY): Payer: Self-pay | Admitting: Emergency Medicine

## 2017-05-21 ENCOUNTER — Other Ambulatory Visit: Payer: Self-pay

## 2017-05-21 ENCOUNTER — Inpatient Hospital Stay (HOSPITAL_COMMUNITY)
Admission: EM | Admit: 2017-05-21 | Discharge: 2017-05-26 | DRG: 472 | Disposition: A | Discharge status: Rehabilitation Facility | Payer: Medicare Other | Attending: Internal Medicine | Admitting: Internal Medicine

## 2017-05-21 ENCOUNTER — Emergency Department (HOSPITAL_COMMUNITY): Payer: Medicare Other

## 2017-05-21 DIAGNOSIS — I739 Peripheral vascular disease, unspecified: Secondary | ICD-10-CM | POA: Diagnosis present

## 2017-05-21 DIAGNOSIS — Z9842 Cataract extraction status, left eye: Secondary | ICD-10-CM

## 2017-05-21 DIAGNOSIS — M4802 Spinal stenosis, cervical region: Secondary | ICD-10-CM | POA: Diagnosis not present

## 2017-05-21 DIAGNOSIS — D519 Vitamin B12 deficiency anemia, unspecified: Secondary | ICD-10-CM | POA: Diagnosis present

## 2017-05-21 DIAGNOSIS — Z419 Encounter for procedure for purposes other than remedying health state, unspecified: Secondary | ICD-10-CM

## 2017-05-21 DIAGNOSIS — K5909 Other constipation: Secondary | ICD-10-CM

## 2017-05-21 DIAGNOSIS — K219 Gastro-esophageal reflux disease without esophagitis: Secondary | ICD-10-CM

## 2017-05-21 DIAGNOSIS — M5001 Cervical disc disorder with myelopathy,  high cervical region: Secondary | ICD-10-CM | POA: Diagnosis present

## 2017-05-21 DIAGNOSIS — M5030 Other cervical disc degeneration, unspecified cervical region: Secondary | ICD-10-CM

## 2017-05-21 DIAGNOSIS — I10 Essential (primary) hypertension: Secondary | ICD-10-CM | POA: Diagnosis present

## 2017-05-21 DIAGNOSIS — Z8673 Personal history of transient ischemic attack (TIA), and cerebral infarction without residual deficits: Secondary | ICD-10-CM

## 2017-05-21 DIAGNOSIS — I480 Paroxysmal atrial fibrillation: Secondary | ICD-10-CM | POA: Diagnosis present

## 2017-05-21 DIAGNOSIS — Z961 Presence of intraocular lens: Secondary | ICD-10-CM | POA: Diagnosis present

## 2017-05-21 DIAGNOSIS — M542 Cervicalgia: Secondary | ICD-10-CM | POA: Diagnosis not present

## 2017-05-21 DIAGNOSIS — R531 Weakness: Secondary | ICD-10-CM

## 2017-05-21 DIAGNOSIS — G834 Cauda equina syndrome: Secondary | ICD-10-CM | POA: Diagnosis present

## 2017-05-21 DIAGNOSIS — N183 Chronic kidney disease, stage 3 unspecified: Secondary | ICD-10-CM | POA: Diagnosis present

## 2017-05-21 DIAGNOSIS — Z66 Do not resuscitate: Secondary | ICD-10-CM | POA: Diagnosis present

## 2017-05-21 DIAGNOSIS — Z79899 Other long term (current) drug therapy: Secondary | ICD-10-CM

## 2017-05-21 DIAGNOSIS — E610 Copper deficiency: Secondary | ICD-10-CM | POA: Diagnosis present

## 2017-05-21 DIAGNOSIS — G2581 Restless legs syndrome: Secondary | ICD-10-CM | POA: Diagnosis present

## 2017-05-21 DIAGNOSIS — E871 Hypo-osmolality and hyponatremia: Secondary | ICD-10-CM | POA: Diagnosis present

## 2017-05-21 DIAGNOSIS — R1310 Dysphagia, unspecified: Secondary | ICD-10-CM | POA: Diagnosis not present

## 2017-05-21 DIAGNOSIS — I130 Hypertensive heart and chronic kidney disease with heart failure and stage 1 through stage 4 chronic kidney disease, or unspecified chronic kidney disease: Secondary | ICD-10-CM | POA: Diagnosis present

## 2017-05-21 DIAGNOSIS — G959 Disease of spinal cord, unspecified: Secondary | ICD-10-CM | POA: Diagnosis present

## 2017-05-21 DIAGNOSIS — G8918 Other acute postprocedural pain: Secondary | ICD-10-CM

## 2017-05-21 DIAGNOSIS — Z7901 Long term (current) use of anticoagulants: Secondary | ICD-10-CM

## 2017-05-21 DIAGNOSIS — I5032 Chronic diastolic (congestive) heart failure: Secondary | ICD-10-CM | POA: Diagnosis present

## 2017-05-21 DIAGNOSIS — Z9841 Cataract extraction status, right eye: Secondary | ICD-10-CM

## 2017-05-21 DIAGNOSIS — Z96651 Presence of right artificial knee joint: Secondary | ICD-10-CM | POA: Diagnosis present

## 2017-05-21 DIAGNOSIS — F329 Major depressive disorder, single episode, unspecified: Secondary | ICD-10-CM | POA: Diagnosis present

## 2017-05-21 LAB — CBC
HCT: 42.2 % (ref 36.0–46.0)
HEMOGLOBIN: 14.5 g/dL (ref 12.0–15.0)
MCH: 31.3 pg (ref 26.0–34.0)
MCHC: 34.4 g/dL (ref 30.0–36.0)
MCV: 90.9 fL (ref 78.0–100.0)
PLATELETS: 198 10*3/uL (ref 150–400)
RBC: 4.64 MIL/uL (ref 3.87–5.11)
RDW: 15.3 % (ref 11.5–15.5)
WBC: 4.1 10*3/uL (ref 4.0–10.5)

## 2017-05-21 LAB — URINALYSIS, ROUTINE W REFLEX MICROSCOPIC
BILIRUBIN URINE: NEGATIVE
Bacteria, UA: NONE SEEN
Glucose, UA: NEGATIVE mg/dL
Hgb urine dipstick: NEGATIVE
Ketones, ur: NEGATIVE mg/dL
NITRITE: NEGATIVE
Protein, ur: NEGATIVE mg/dL
SPECIFIC GRAVITY, URINE: 1.008 (ref 1.005–1.030)
pH: 8 (ref 5.0–8.0)

## 2017-05-21 LAB — BASIC METABOLIC PANEL
ANION GAP: 8 (ref 5–15)
BUN: 14 mg/dL (ref 6–20)
CALCIUM: 9 mg/dL (ref 8.9–10.3)
CO2: 24 mmol/L (ref 22–32)
CREATININE: 1.09 mg/dL — AB (ref 0.44–1.00)
Chloride: 101 mmol/L (ref 101–111)
GFR calc Af Amer: 51 mL/min — ABNORMAL LOW (ref >60)
GFR, EST NON AFRICAN AMERICAN: 44 mL/min — AB (ref >60)
Glucose, Bld: 111 mg/dL — ABNORMAL HIGH (ref 65–99)
Potassium: 4.1 mmol/L (ref 3.5–5.1)
SODIUM: 133 mmol/L — AB (ref 135–145)

## 2017-05-21 MED ORDER — FENTANYL CITRATE (PF) 100 MCG/2ML IJ SOLN
50.0000 ug | Freq: Once | INTRAMUSCULAR | Status: AC
  Administered 2017-05-21: 50 ug via INTRAVENOUS
  Filled 2017-05-21: qty 2

## 2017-05-21 MED ORDER — LORAZEPAM 2 MG/ML IJ SOLN
0.5000 mg | INTRAMUSCULAR | Status: DC | PRN
  Administered 2017-05-21: 0.5 mg via INTRAVENOUS
  Filled 2017-05-21: qty 1

## 2017-05-21 MED ORDER — ACETAMINOPHEN 500 MG PO TABS
1000.0000 mg | ORAL_TABLET | Freq: Once | ORAL | Status: AC
  Administered 2017-05-21: 1000 mg via ORAL
  Filled 2017-05-21: qty 2

## 2017-05-21 NOTE — ED Notes (Signed)
Patient transported to MRI 

## 2017-05-21 NOTE — ED Provider Notes (Signed)
[facility] EMERGENCY DEPARTMENT Provider Note  CSN: [id number] Arrival date & time: 05/21/17 1031  Chief Complaint(s) Numbness  HPI [name] is a 83 y.o. female with extensive past medical history listed below including A. fib on Eliquis, TIAs, osteoarthritis who presents to the emergency department for 2 days of right upper and lower extremity weakness and left facial droop that was noted earlier today.  Patient reports that she has arthritis of her spine and was evaluated by her orthopedic surgeon 3 weeks ago at which point they gave her a steroid injection in her back for the arthritis which did not provide any relief.  She stopped her Eliquis 2 days prior and resumed the following day.  She reports that since the procedure she has had worsening pain however noted the weakness 2 days ago.  Family and patient also note contracture of the right hand.  No alleviating or aggravating factors.  Patient and family report mechanical fall 3 weeks ago following the procedure.  No other trauma since.  They deny any recent fevers, infections, chest pain, shortness of breath, nausea, vomiting, abdominal pain, urinary symptoms.   HPI  Past Medical History Past Medical History:  Diagnosis Date  . Arthritis   . Chronic kidney disease   . Depression   . Dysrhythmia 8/13   palpitations/ now resolved  . GERD (gastroesophageal reflux disease)   . History of blood transfusion 2012  . Hypertension   . Peripheral vascular disease (HCC)    varicose veins bilaterally   . Restless legs syndrome    Patient Active Problem List   Diagnosis Date Noted  . TIA (transient ischemic attack) 05/26/2013  . HTN (hypertension), malignant 05/26/2013  . Hyponatremia 05/26/2013  . Neuropathic pain 05/26/2013  . Restless leg syndrome 05/26/2013  . OA (osteoarthritis) of knee 11/07/2012  . EUSTACHIAN TUBE DYSFUNCTION 05/30/2007  . ALLERGIC RHINITIS 05/30/2007   Home Medication(s) Prior to  Admission medications   Medication Sig Start Date End Date Taking? Authorizing Provider  albuterol (VENTOLIN HFA) 108 (90 Base) MCG/ACT inhaler Inhale 2 puffs daily as needed into the lungs for wheezing or shortness of breath.    Yes [provider]  ALPRAZolam (XANAX) 0.25 MG tablet Take 0.25 mg at bedtime as needed by mouth for anxiety or sleep.  09/21/16  Yes [provider]  amiodarone (PACERONE) 200 MG tablet Take 200 mg at bedtime by mouth.    Yes [provider]  amLODipine (NORVASC) 5 MG tablet Take 2.5 mg daily after breakfast by mouth.    Yes [provider]  bisacodyl (DULCOLAX) 10 MG suppository Place 10 mg daily rectally.   Yes [provider]  cetirizine (ZYRTEC) 10 MG tablet Take 10 mg by mouth daily.   Yes [provider]  conjugated estrogens (PREMARIN) vaginal cream Place 1 application at bedtime as needed vaginally.   Yes [provider]  Cyanocobalamin (VITAMIN B-12 IJ) Inject 1 each every 30 (thirty) days as directed.   Yes [provider]  estradiol (ESTRACE) 0.5 MG tablet Take 0.5 mg daily by mouth.   Yes [provider]  fluticasone (FLONASE) 50 MCG/ACT nasal spray Place 1 spray daily as needed into both nostrils for allergies.    Yes [provider]  metoprolol succinate (TOPROL-XL) 100 MG 24 hr tablet Take 100 mg 2 (two) times daily by mouth.  10/21/16  Yes [provider]  pantoprazole (PROTONIX) 40 MG tablet Take 40 mg by mouth daily.  Yes [provider]  rOPINIRole (REQUIP) 0.5 MG tablet Take 1 mg by mouth at bedtime.   Yes [provider]  XARELTO 15 MG TABS tablet Take 15 mg daily by mouth.  10/26/16  Yes [provider]  acebutolol (SECTRAL) 400 MG capsule Take 400 mg by mouth daily after breakfast.     [provider]  acetaminophen (TYLENOL) 500 MG tablet Take 500 mg by mouth every 6 (six) hours as needed for pain.    [provider]  aspirin EC 81 MG EC tablet Take 1 tablet (81 mg total) by mouth daily. 05/27/13   Drema DallasWoods, Curtis J, MD  estradiol (ESTRACE) 1 MG tablet Take 1 mg by mouth daily.    [provider]  gabapentin (NEURONTIN) 300 MG capsule Take 600 mg by mouth daily.    [provider]  gabapentin (NEURONTIN) 300 MG capsule Take 1 capsule (300 mg total) by mouth 2 (two) times daily. 01/17/16   Asencion IslamStover, Titorya, DPM  Omeprazole-Sodium Bicarbonate (ZEGERID) 20-1100 MG CAPS Take 1 capsule by mouth at bedtime.     [provider]                                                                                                                                    Past Surgical History Past Surgical History:  Procedure Laterality Date  . ABDOMINAL HYSTERECTOMY    . APPENDECTOMY    . EYE SURGERY Bilateral    cataract extraction with IOL  . JOINT REPLACEMENT Right    knee  . SEPTOPLASTY     repair deviated septum  . VARICOSE VEIN SURGERY Bilateral    Family History Family History  Problem Relation Age of Onset  . Heart attack Father   . Stroke Father   . Hypertension Father   . COPD Mother   . Lung cancer Sister   . Uterine cancer Sister   . Colon polyps Daughter   . Colon cancer Unknown     Social History Social History   Tobacco Use  . Smoking status: Never Smoker  . Smokeless tobacco: Never Used  Substance Use Topics  . Alcohol use: No  . Drug use: No   Allergies Hctz [hydrochlorothiazide]; Lasix [furosemide]; Penicillins; and Tramadol-acetaminophen  Review of Systems Review of Systems All other systems are reviewed and are negative for acute change except as noted in the HPI  Physical Exam Vital Signs  I have reviewed the triage vital signs BP (!) 143/81   Pulse (!) 55   Temp 98 F (36.7 C) (Oral)   Resp 16   SpO2 96%   Physical Exam  Constitutional: She is oriented to person, place, and time. She appears well-developed and well-nourished. No  distress.  HENT:  Head: Normocephalic and atraumatic.  Nose: Nose normal.  Eyes: Conjunctivae and EOM are normal. Pupils are equal, round, and reactive to light. Right eye exhibits no  discharge. Left eye exhibits no discharge. No scleral icterus.  Neck: Normal range of motion. Neck supple.  Cardiovascular: Normal rate and regular rhythm. Exam reveals no gallop and no friction rub.  No murmur heard. Pulmonary/Chest: Effort normal and breath sounds normal. No stridor. No respiratory distress. She has no rales.  Abdominal: Soft. She exhibits no distension. There is no tenderness.  Musculoskeletal: She exhibits no edema or tenderness.  Neurological: She is alert and oriented to person, place, and time.  Mental Status:  Alert and oriented to person, place, and time.  Attention and concentration normal.  Speech clear.    Cranial Nerves:  II Visual Fields: Intact to confrontation. Visual fields intact. III, IV, VI: Pupils equal and reactive to light and near. Full eye movement without nystagmus  V Facial Sensation: Normal. No weakness of masticatory muscles  VII: Left facial droop with forehead sparing VIII Auditory Acuity: Grossly normal  IX/X: The uvula is midline; the palate elevates symmetrically  XI: Normal sternocleidomastoid and trapezius strength  XII: The tongue is midline. No atrophy or fasciculations.   Motor System: Muscle Strength: 4 out of 5 strength of the right upper extremity with extension at the elbow and wrist flexion. 3/5 to RLE. 5/5 strength to left upper and lower extremities. Muscle Tone: Contracture of the right hand. Reflexes: DTRs: 2+ and symmetrical in all four extremities. No Clonus Coordination: Impaired finger to nose and heel to shin with right extremities due to weakness. No tremor.  Sensation: Intact to light touch, and pinprick.  Gait: Deferred   Skin: Skin is warm and dry. No rash noted. She is not diaphoretic. No erythema.  Psychiatric: She has a  normal mood and affect.  Vitals reviewed.   ED Results and Treatments Labs (all labs ordered are listed, but only abnormal results are displayed) Labs Reviewed  BASIC METABOLIC PANEL - Abnormal; Notable for the following components:      Result Value   Sodium 133 (*)    Glucose, Bld 111 (*)    Creatinine, Ser 1.09 (*)    GFR calc non Af Amer 44 (*)    GFR calc Af Amer 51 (*)    All other components within normal limits  URINALYSIS, ROUTINE W REFLEX MICROSCOPIC - Abnormal; Notable for the following components:   Color, Urine STRAW (*)    Leukocytes, UA LARGE (*)    Squamous Epithelial / LPF 0-5 (*)    All other components within normal limits  CBC  CBG MONITORING, ED                                                                                                                         EKG  EKG Interpretation  Date/Time:  Friday May 21 2017 10:59:43 EST Ventricular Rate:  54 PR Interval:  238 QRS Duration: 56 QT Interval:  464 QTC Calculation: 440 R Axis:   -8 Text Interpretation:  Sinus bradycardia with 1st degree A-V block Anterior infarct , age  undetermined Lateral injury pattern Abnormal ECG Artifact Poor data quality Otherwise no significant change Confirmed by Drema Pry [id number]) on 05/21/2017 1:41:09 PM      Radiology No results found. Pertinent labs & imaging results that were available during my care of the patient were reviewed by me and considered in my medical decision making (see chart for details).  Medications Ordered in ED Medications  LORazepam (ATIVAN) injection 0.5 mg (not administered)  fentaNYL (SUBLIMAZE) injection 50 mcg (50 mcg Intravenous Given 05/21/17 1257)  acetaminophen (TYLENOL) tablet 1,000 mg (1,000 mg Oral Given 05/21/17 1256)                                                                                                                                    Procedures Procedures  (including critical care time)  Medical Decision  Making / ED Course I have reviewed the nursing notes for this encounter and the patient's prior records (if available in EHR or on provided paperwork).    We will assess for acute/subacute CVA given the left facial droop with right-sided extremity weakness.  Also considering peripheral nerve impingement resulting in the right upper extremity contractures and weakness.  Given her recent instrumentation also would like to assess for epidural hematoma.  Screening labs grossly reassuring.  Currently awaiting MRI of brain, cervical, thoracic, and lumbar spine  Patient care turned over to Dr Jeraldine Loots at 1600. Patient case and results discussed in detail; please see their note for further ED managment.    This chart was dictated using voice recognition software.  Despite best efforts to proofread,  errors can occur which can change the documentation meaning.   Nira Conn, MD 05/21/17 1556

## 2017-05-21 NOTE — ED Triage Notes (Signed)
Pt reports she has serve arthritis in her back and over the last month has been having right hand numbness and numbness into right leg with difficulty walking.  Pt states one week ago she seen her orthopedic and was taken off blood thinner for 3 days to have injection in her back. Pt states for the last 2 days has been having weakness in her leg leg and feels like she's going to fall. Pt states she did fall last week. Pt lives at home alone.

## 2017-05-22 ENCOUNTER — Encounter (HOSPITAL_COMMUNITY): Payer: Self-pay | Admitting: Emergency Medicine

## 2017-05-22 ENCOUNTER — Other Ambulatory Visit: Payer: Self-pay

## 2017-05-22 DIAGNOSIS — D519 Vitamin B12 deficiency anemia, unspecified: Secondary | ICD-10-CM | POA: Diagnosis present

## 2017-05-22 DIAGNOSIS — M5030 Other cervical disc degeneration, unspecified cervical region: Secondary | ICD-10-CM | POA: Diagnosis not present

## 2017-05-22 DIAGNOSIS — K219 Gastro-esophageal reflux disease without esophagitis: Secondary | ICD-10-CM | POA: Diagnosis present

## 2017-05-22 DIAGNOSIS — E610 Copper deficiency: Secondary | ICD-10-CM | POA: Diagnosis present

## 2017-05-22 DIAGNOSIS — I482 Chronic atrial fibrillation: Secondary | ICD-10-CM | POA: Diagnosis not present

## 2017-05-22 DIAGNOSIS — E871 Hypo-osmolality and hyponatremia: Secondary | ICD-10-CM | POA: Diagnosis present

## 2017-05-22 DIAGNOSIS — I130 Hypertensive heart and chronic kidney disease with heart failure and stage 1 through stage 4 chronic kidney disease, or unspecified chronic kidney disease: Secondary | ICD-10-CM | POA: Diagnosis present

## 2017-05-22 DIAGNOSIS — Z8673 Personal history of transient ischemic attack (TIA), and cerebral infarction without residual deficits: Secondary | ICD-10-CM | POA: Diagnosis not present

## 2017-05-22 DIAGNOSIS — G834 Cauda equina syndrome: Secondary | ICD-10-CM | POA: Diagnosis present

## 2017-05-22 DIAGNOSIS — M542 Cervicalgia: Secondary | ICD-10-CM | POA: Diagnosis present

## 2017-05-22 DIAGNOSIS — Z79899 Other long term (current) drug therapy: Secondary | ICD-10-CM | POA: Diagnosis not present

## 2017-05-22 DIAGNOSIS — Z66 Do not resuscitate: Secondary | ICD-10-CM | POA: Diagnosis present

## 2017-05-22 DIAGNOSIS — R531 Weakness: Secondary | ICD-10-CM | POA: Diagnosis not present

## 2017-05-22 DIAGNOSIS — R1310 Dysphagia, unspecified: Secondary | ICD-10-CM | POA: Diagnosis not present

## 2017-05-22 DIAGNOSIS — K5903 Drug induced constipation: Secondary | ICD-10-CM | POA: Diagnosis not present

## 2017-05-22 DIAGNOSIS — R609 Edema, unspecified: Secondary | ICD-10-CM | POA: Diagnosis not present

## 2017-05-22 DIAGNOSIS — R1313 Dysphagia, pharyngeal phase: Secondary | ICD-10-CM | POA: Diagnosis not present

## 2017-05-22 DIAGNOSIS — G959 Disease of spinal cord, unspecified: Secondary | ICD-10-CM | POA: Diagnosis not present

## 2017-05-22 DIAGNOSIS — Z01810 Encounter for preprocedural cardiovascular examination: Secondary | ICD-10-CM | POA: Diagnosis not present

## 2017-05-22 DIAGNOSIS — G8191 Hemiplegia, unspecified affecting right dominant side: Secondary | ICD-10-CM | POA: Diagnosis not present

## 2017-05-22 DIAGNOSIS — Z96651 Presence of right artificial knee joint: Secondary | ICD-10-CM | POA: Diagnosis present

## 2017-05-22 DIAGNOSIS — I5032 Chronic diastolic (congestive) heart failure: Secondary | ICD-10-CM | POA: Diagnosis present

## 2017-05-22 DIAGNOSIS — R001 Bradycardia, unspecified: Secondary | ICD-10-CM | POA: Diagnosis not present

## 2017-05-22 DIAGNOSIS — M5440 Lumbago with sciatica, unspecified side: Secondary | ICD-10-CM | POA: Diagnosis not present

## 2017-05-22 DIAGNOSIS — K649 Unspecified hemorrhoids: Secondary | ICD-10-CM | POA: Diagnosis not present

## 2017-05-22 DIAGNOSIS — M5001 Cervical disc disorder with myelopathy,  high cervical region: Secondary | ICD-10-CM | POA: Diagnosis present

## 2017-05-22 DIAGNOSIS — R269 Unspecified abnormalities of gait and mobility: Secondary | ICD-10-CM | POA: Diagnosis not present

## 2017-05-22 DIAGNOSIS — Z4789 Encounter for other orthopedic aftercare: Secondary | ICD-10-CM | POA: Diagnosis not present

## 2017-05-22 DIAGNOSIS — Z9842 Cataract extraction status, left eye: Secondary | ICD-10-CM | POA: Diagnosis not present

## 2017-05-22 DIAGNOSIS — Z9841 Cataract extraction status, right eye: Secondary | ICD-10-CM | POA: Diagnosis not present

## 2017-05-22 DIAGNOSIS — G9520 Unspecified cord compression: Secondary | ICD-10-CM | POA: Diagnosis not present

## 2017-05-22 DIAGNOSIS — G8918 Other acute postprocedural pain: Secondary | ICD-10-CM | POA: Diagnosis not present

## 2017-05-22 DIAGNOSIS — Z7901 Long term (current) use of anticoagulants: Secondary | ICD-10-CM | POA: Diagnosis not present

## 2017-05-22 DIAGNOSIS — I10 Essential (primary) hypertension: Secondary | ICD-10-CM | POA: Diagnosis not present

## 2017-05-22 DIAGNOSIS — G2581 Restless legs syndrome: Secondary | ICD-10-CM | POA: Diagnosis present

## 2017-05-22 DIAGNOSIS — F329 Major depressive disorder, single episode, unspecified: Secondary | ICD-10-CM | POA: Diagnosis present

## 2017-05-22 DIAGNOSIS — M4802 Spinal stenosis, cervical region: Secondary | ICD-10-CM | POA: Diagnosis present

## 2017-05-22 DIAGNOSIS — I480 Paroxysmal atrial fibrillation: Secondary | ICD-10-CM | POA: Diagnosis present

## 2017-05-22 DIAGNOSIS — I739 Peripheral vascular disease, unspecified: Secondary | ICD-10-CM | POA: Diagnosis present

## 2017-05-22 DIAGNOSIS — Z961 Presence of intraocular lens: Secondary | ICD-10-CM | POA: Diagnosis present

## 2017-05-22 DIAGNOSIS — G8929 Other chronic pain: Secondary | ICD-10-CM | POA: Diagnosis not present

## 2017-05-22 DIAGNOSIS — N183 Chronic kidney disease, stage 3 (moderate): Secondary | ICD-10-CM | POA: Diagnosis present

## 2017-05-22 DIAGNOSIS — K5909 Other constipation: Secondary | ICD-10-CM | POA: Diagnosis not present

## 2017-05-22 DIAGNOSIS — M5441 Lumbago with sciatica, right side: Secondary | ICD-10-CM | POA: Diagnosis not present

## 2017-05-22 LAB — CBC
HEMATOCRIT: 41.6 % (ref 36.0–46.0)
Hemoglobin: 14.1 g/dL (ref 12.0–15.0)
MCH: 30.9 pg (ref 26.0–34.0)
MCHC: 33.9 g/dL (ref 30.0–36.0)
MCV: 91.2 fL (ref 78.0–100.0)
PLATELETS: 209 10*3/uL (ref 150–400)
RBC: 4.56 MIL/uL (ref 3.87–5.11)
RDW: 15.4 % (ref 11.5–15.5)
WBC: 6 10*3/uL (ref 4.0–10.5)

## 2017-05-22 LAB — LIPID PANEL
Cholesterol: 167 mg/dL (ref 0–200)
HDL: 81 mg/dL
LDL Cholesterol: 71 mg/dL (ref 0–99)
Total CHOL/HDL Ratio: 2.1 ratio
Triglycerides: 73 mg/dL
VLDL: 15 mg/dL (ref 0–40)

## 2017-05-22 LAB — CREATININE, SERUM
Creatinine, Ser: 1.24 mg/dL — ABNORMAL HIGH (ref 0.44–1.00)
GFR calc Af Amer: 44 mL/min — ABNORMAL LOW
GFR calc non Af Amer: 38 mL/min — ABNORMAL LOW

## 2017-05-22 LAB — HEMOGLOBIN A1C
Hgb A1c MFr Bld: 5.5 % (ref 4.8–5.6)
Mean Plasma Glucose: 111.15 mg/dL

## 2017-05-22 LAB — VITAMIN B12: Vitamin B-12: 874 pg/mL (ref 180–914)

## 2017-05-22 MED ORDER — BISACODYL 10 MG RE SUPP
10.0000 mg | Freq: Every day | RECTAL | Status: DC
  Administered 2017-05-22 – 2017-05-26 (×3): 10 mg via RECTAL
  Filled 2017-05-22 (×6): qty 1

## 2017-05-22 MED ORDER — ALPRAZOLAM 0.25 MG PO TABS
0.2500 mg | ORAL_TABLET | Freq: Every evening | ORAL | Status: DC | PRN
  Administered 2017-05-25: 0.25 mg via ORAL
  Filled 2017-05-22 (×2): qty 1

## 2017-05-22 MED ORDER — METOPROLOL SUCCINATE ER 25 MG PO TB24
50.0000 mg | ORAL_TABLET | Freq: Two times a day (BID) | ORAL | Status: DC
  Administered 2017-05-22 – 2017-05-23 (×2): 50 mg via ORAL
  Filled 2017-05-22 (×2): qty 2

## 2017-05-22 MED ORDER — ROPINIROLE HCL 1 MG PO TABS
1.0000 mg | ORAL_TABLET | Freq: Every day | ORAL | Status: DC
  Administered 2017-05-22 – 2017-05-25 (×4): 1 mg via ORAL
  Filled 2017-05-22 (×4): qty 1

## 2017-05-22 MED ORDER — RIVAROXABAN 15 MG PO TABS: 15.0000 mg | ORAL_TABLET | Freq: Every day | ORAL | Status: DC

## 2017-05-22 MED ORDER — LORATADINE 10 MG PO TABS
10.0000 mg | ORAL_TABLET | Freq: Every day | ORAL | Status: DC
  Administered 2017-05-22 – 2017-05-26 (×5): 10 mg via ORAL
  Filled 2017-05-22 (×5): qty 1

## 2017-05-22 MED ORDER — METOPROLOL SUCCINATE ER 100 MG PO TB24
100.0000 mg | ORAL_TABLET | Freq: Two times a day (BID) | ORAL | Status: DC
  Administered 2017-05-22: 100 mg via ORAL
  Filled 2017-05-22 (×2): qty 1

## 2017-05-22 MED ORDER — ACETAMINOPHEN 325 MG PO TABS
650.0000 mg | ORAL_TABLET | Freq: Once | ORAL | Status: AC
  Administered 2017-05-22: 650 mg via ORAL
  Filled 2017-05-22: qty 2

## 2017-05-22 MED ORDER — FENTANYL CITRATE (PF) 100 MCG/2ML IJ SOLN
50.0000 ug | Freq: Once | INTRAMUSCULAR | Status: AC
  Administered 2017-05-22: 50 ug via INTRAVENOUS
  Filled 2017-05-22: qty 2

## 2017-05-22 MED ORDER — SENNOSIDES-DOCUSATE SODIUM 8.6-50 MG PO TABS: 1.0000 | ORAL_TABLET | Freq: Every evening | ORAL | Status: DC | PRN

## 2017-05-22 MED ORDER — SODIUM CHLORIDE 0.9 % IV SOLN
INTRAVENOUS | Status: DC
  Administered 2017-05-22: 03:00:00 via INTRAVENOUS

## 2017-05-22 MED ORDER — OXYCODONE HCL 5 MG PO TABS
5.0000 mg | ORAL_TABLET | ORAL | Status: DC | PRN
  Administered 2017-05-22 – 2017-05-26 (×14): 5 mg via ORAL
  Filled 2017-05-22 (×13): qty 1

## 2017-05-22 MED ORDER — AMIODARONE HCL 100 MG PO TABS
200.0000 mg | ORAL_TABLET | Freq: Every day | ORAL | Status: DC
  Administered 2017-05-22 – 2017-05-25 (×4): 200 mg via ORAL
  Filled 2017-05-22: qty 1
  Filled 2017-05-22 (×2): qty 2
  Filled 2017-05-22: qty 1

## 2017-05-22 MED ORDER — AMLODIPINE BESYLATE 2.5 MG PO TABS
2.5000 mg | ORAL_TABLET | Freq: Every day | ORAL | Status: DC
  Administered 2017-05-22 – 2017-05-26 (×5): 2.5 mg via ORAL
  Filled 2017-05-22 (×5): qty 1

## 2017-05-22 MED ORDER — KETOROLAC TROMETHAMINE 15 MG/ML IJ SOLN
15.0000 mg | Freq: Once | INTRAMUSCULAR | Status: AC
  Administered 2017-05-22: 15 mg via INTRAVENOUS
  Filled 2017-05-22: qty 1

## 2017-05-22 MED ORDER — STROKE: EARLY STAGES OF RECOVERY BOOK
Freq: Once | Status: AC
  Administered 2017-05-22: 03:00:00

## 2017-05-22 MED ORDER — ALBUTEROL SULFATE (2.5 MG/3ML) 0.083% IN NEBU: 2.5000 mg | INHALATION_SOLUTION | Freq: Every day | RESPIRATORY_TRACT | Status: DC | PRN

## 2017-05-22 MED ORDER — PANTOPRAZOLE SODIUM 40 MG PO TBEC
40.0000 mg | DELAYED_RELEASE_TABLET | Freq: Every day | ORAL | Status: DC
  Administered 2017-05-22 – 2017-05-26 (×5): 40 mg via ORAL
  Filled 2017-05-22 (×5): qty 1

## 2017-05-22 NOTE — Evaluation (Signed)
Physical Therapy Evaluation Patient Details Name: Lisa GamblerDorothy W Solomon MRN: [id number] DOB: 05/04/1928 Today's Date: 05/22/2017   History of Present Illness  Pt is an 83 y.o. female with atrial fibrillation on Eliquis who presented to the ED with a 2 day history of RUE and RLE weakness as well as left facial droop. Stroke was initially suspected but MRI brain was negative for acute infarction. multilevel cervical disc degeneration with marked stenosis with cord compression and resultant signal abnormality worse at the C5-6 level but also present at C3-4 and C6-7. She has accompanying severe lumbar stenosis at L3-4 and L4-5. PMH includes arthritis, depression, chronic kidney disease, dysrhythmia, HTN, GERD, peripheral vascular disease.  Clinical Impression  Pt presented supine in bed with HOB elevated, awake and willing to participate in therapy session. Prior to admission, pt reported that she was ambulating with use of RW and independent with ADLs. Pt lives alone but has family/friends that could potentially provide full-time assist. Pt currently requires min guard for transfers and min A to ambulate a short distance in her room (see below for details). Pt would continue to benefit from skilled physical therapy services at this time while admitted and after d/c to address the below listed limitations in order to improve overall safety and independence with functional mobility.     Follow Up Recommendations SNF;Other (comment)(Or home if pt can have 24/7 supervision/assist with HHPT)    Equipment Recommendations  None recommended by PT    Recommendations for Other Services       Precautions / Restrictions Precautions Precautions: Fall Restrictions Weight Bearing Restrictions: No      Mobility  Bed Mobility Overal bed mobility: Needs Assistance Bed Mobility: Supine to Sit;Sit to Supine     Supine to sit: Supervision Sit to supine: Supervision   General bed mobility comments: supervision  for safety  Transfers Overall transfer level: Needs assistance Equipment used: Rolling walker (2 wheeled) Transfers: Sit to/from Stand Sit to Stand: Min guard         General transfer comment: min guard for safety  Ambulation/Gait Ambulation/Gait assistance: Min assist Ambulation Distance (Feet): 25 Feet Assistive device: Rolling walker (2 wheeled) Gait Pattern/deviations: Step-through pattern;Decreased step length - right;Decreased step length - left;Decreased stride length;Decreased dorsiflexion - right(R foot drop)     General Gait Details: pt with modest instability requiring constant min A secondary to R LE weakness; pt with consistent R foot drop and genu recurvatum on R LE during stance phase  Stairs            Wheelchair Mobility    Modified Rankin (Stroke Patients Only)       Balance Overall balance assessment: Needs assistance Sitting-balance support: Feet supported Sitting balance-Leahy Scale: Good     Standing balance support: During functional activity;Bilateral upper extremity supported Standing balance-Leahy Scale: Poor                               Pertinent Vitals/Pain Pain Assessment: Faces Faces Pain Scale: No hurt    Home Living Family/patient expects to be discharged to:: Private residence Living Arrangements: Alone Available Help at Discharge: Family;Other (Comment)(going to talk to family about 24/7 assist) Type of Home: House Home Access: Ramped entrance     Home Layout: One level(with basement) Home Equipment: Walker - 2 wheels;Walker - 4 wheels;Cane - single point;Toilet riser      Prior Function Level of Independence: Independent with assistive device(s)  Comments: pt reported that she ambulates with RW     Hand Dominance   Dominant Hand: Right    Extremity/Trunk Assessment   Upper Extremity Assessment Upper Extremity Assessment: Defer to OT evaluation    Lower Extremity Assessment Lower  Extremity Assessment: Generalized weakness;RLE deficits/detail RLE Deficits / Details: MMT revealed 3/5 for hip flexion, 3+/5 for knee flexion/extension, 2/5 for ankle DF; functionally very weak with foot drop       Communication   Communication: No difficulties  Cognition Arousal/Alertness: Awake/alert Behavior During Therapy: WFL for tasks assessed/performed Overall Cognitive Status: Within Functional Limits for tasks assessed                                        General Comments      Exercises     Assessment/Plan    PT Assessment Patient needs continued PT services  PT Problem List Decreased strength;Decreased mobility;Decreased balance;Decreased coordination       PT Treatment Interventions DME instruction;Gait training;Stair training;Functional mobility training;Therapeutic activities;Therapeutic exercise;Balance training;Neuromuscular re-education;Patient/family education    PT Goals (Current goals can be found in the Care Plan section)  Acute Rehab PT Goals Patient Stated Goal: return to PLOF PT Goal Formulation: With patient Time For Goal Achievement: 06/05/17 Potential to Achieve Goals: Good    Frequency Min 3X/week   Barriers to discharge        Co-evaluation               AM-PAC PT "6 Clicks" Daily Activity  Outcome Measure Difficulty turning over in bed (including adjusting bedclothes, sheets and blankets)?: None Difficulty moving from lying on back to sitting on the side of the bed? : A Little Difficulty sitting down on and standing up from a chair with arms (e.g., wheelchair, bedside commode, etc,.)?: Unable Help needed moving to and from a bed to chair (including a wheelchair)?: A Little Help needed walking in hospital room?: A Little Help needed climbing 3-5 steps with a railing? : A Lot 6 Click Score: 16    End of Session Equipment Utilized During Treatment: Gait belt Activity Tolerance: Patient tolerated treatment  well Patient left: in bed;with call bell/phone within reach;with bed alarm set Nurse Communication: Mobility status PT Visit Diagnosis: Unsteadiness on feet (R26.81);Other abnormalities of gait and mobility (R26.89)    Time: [id number] PT Time Calculation (min) (ACUTE ONLY): 24 min   Charges:   PT Evaluation $PT Eval Moderate Complexity: 1 Mod PT Treatments $Therapeutic Activity: 8-22 mins   PT G Codes:        SemmesJennifer Jakayla Schweppe, PT, DPT [phone]   Alessandra BevelsJennifer M Ron Junco 05/22/2017, 4:10 PM

## 2017-05-22 NOTE — Evaluation (Addendum)
Occupational Therapy Evaluation Patient Details Name: Lisa GamblerDorothy W Chou MRN: [id number] DOB: 05/12/1928 Today's Date: 05/22/2017    History of Present Illness 83 y.o. female with atrial fibrillation on Eliquis who presented to the ED with a 2 day history of RUE and RLE weakness as well as left facial droop. Stroke was initially suspected but MRI brain was negative for acute infarction. PMH includes arthritis, depression, chronic kidney disease, dysrhythmia, HTN, GERD, peripheral vascular disease.   Clinical Impression   Pt admitted for above. Pt independent with ADLs, PTA. Feel pt will benefit from acute OT to increase independence prior to d/c. Pt to talk with her family to see if they can provide 24/7 assist.    Follow Up Recommendations  SNF;Supervision/Assistance - 24 hour(HH if she can have 24/7 assist at home)    Equipment Recommendations  (shower seat if she does not have one)    Recommendations for Other Services       Precautions / Restrictions Precautions Precautions: Fall Restrictions Weight Bearing Restrictions: No      Mobility Bed Mobility Overal bed mobility: Needs Assistance Bed Mobility: Supine to Sit;Sit to Supine     Supine to sit: Supervision Sit to supine: Supervision      Transfers Overall transfer level: Needs assistance Equipment used: Rolling walker (2 wheeled) Transfers: Sit to/from UGI CorporationStand;Stand Pivot Transfers Sit to Stand: Min assist Stand pivot transfers: Min guard            Balance      Min guard for stand pivot transfer with RW.                                     ADL either performed or assessed with clinical judgement   ADL Overall ADL's : Needs assistance/impaired Eating/Feeding: Bed level;Set up   Grooming: Wash/dry face;Set up;Sitting               Lower Body Dressing: Moderate assistance;Sit to/from stand   Toilet Transfer: Minimal Camera operatorassistance;Stand-pivot;RW;BSC Toilet Transfer Details (indicate  cue type and reason): assist for sit to stand transfer Toileting- Clothing Manipulation and Hygiene: Sit to/from stand;Minimal assistance       Functional mobility during ADLs: Minimal assistance;Rolling walker General ADL [facility]      Vision         Perception     Praxis      Pertinent Vitals/Pain Pain Assessment: No/denies pain     Hand Dominance     Extremity/Trunk Assessment Upper Extremity Assessment Upper Extremity Assessment: RUE deficits/detail;LUE deficits/detail RUE Deficits / Details: unable to actively straighten especially third digit. weakness in shoulder flexors RUE Coordination: decreased fine motor LUE Deficits / Details: weakness in shoulder flexors by Rt side weaker   Lower Extremity Assessment Lower Extremity Assessment: RLE deficits/detail;Defer to PT evaluation RLE Deficits / Details: weakness-dragging left foot       Communication Communication Communication: No difficulties   Cognition Arousal/Alertness: Awake/alert Behavior During Therapy: WFL for tasks assessed/performed Overall Cognitive Status: Within Functional Limits for tasks assessed                                     General Comments       Exercises     Shoulder Instructions      Home Living Family/patient expects to be discharged to:: Unsure  Living Arrangements: Alone Available Help at Discharge: Family;Other (Comment)(going to talk to family about 24/7 assist) Type of Home: House Home Access: Ramped entrance     Home Layout: One level(with basement)     Bathroom Shower/Tub: Tub only;Walk-in shower   Bathroom Toilet: Standard     Home Equipment: Environmental consultantWalker - 2 wheels;Walker - 4 wheels;Cane - single point;Toilet riser          Prior Functioning/Environment Level of Independence: Independent with assistive device(s)                 OT Problem List: Decreased strength;Decreased range of motion;Impaired UE functional  use;Decreased knowledge of precautions;Decreased knowledge of use of DME or AE;Decreased coordination;Impaired balance (sitting and/or standing)      OT Treatment/Interventions: Self-care/ADL training;DME and/or AE instruction;Therapeutic activities;Patient/family education;Balance training;Therapeutic exercise    OT Goals(Current goals can be found in the care plan section) Acute Rehab OT Goals Patient Stated Goal: get back to normal OT Goal Formulation: With patient Time For Goal Achievement: 05/29/17 Potential to Achieve Goals: Good ADL Goals Pt Will Perform Lower Body Bathing: with set-up;with supervision;sit to/from stand;with adaptive equipment Pt Will Perform Lower Body Dressing: with set-up;with supervision;with adaptive equipment;sit to/from stand Pt Will Transfer to Toilet: with min guard assist;ambulating Additional ADL Goal #1: Pt will independently perform Lt UE exercises to increase strength and coordination.  OT Frequency: Min 2X/week   Barriers to D/C:            Co-evaluation              AM-PAC PT "6 Clicks" Daily Activity     Outcome Measure Help from another person eating meals?: A Little Help from another person taking care of personal grooming?: A Little Help from another person toileting, which includes using toliet, bedpan, or urinal?: A Little Help from another person bathing (including washing, rinsing, drying)?: A Lot Help from another person to put on and taking off regular upper body clothing?: A Little Help from another person to put on and taking off regular lower body clothing?: A Lot 6 Click Score: 16   End of Session Equipment Utilized During Treatment: Gait belt;Rolling walker Nurse Communication: Other (comment)(notified tech that urinal retriever is off)  Activity Tolerance: Patient tolerated treatment well Patient left: in bed;with call bell/phone within reach;with bed alarm set  OT Visit Diagnosis: Unsteadiness on feet (R26.81)                 Time: [id number] OT Time Calculation (min): 23 min Charges:  OT General Charges $OT Visit: 1 Visit OT Evaluation $OT Eval Moderate Complexity: 1 Mod G-Codes:      Tsutomu Barfoot L Andreah Goheen OTR/L 05/22/2017, 11:05 AM

## 2017-05-22 NOTE — Consult Note (Signed)
NEURO HOSPITALIST CONSULT NOTE   Requestig physician: Dr. Alvino Chapel  Reason for Consult: Lumbar back pain with RLE sciatic distribution pain and worsened RLE weakness  History obtained from:  Patient and Chart    HPI:                                                                                                                                          [name] is an 83 y.o. female with atrial fibrillation on Eliquis who presented to the ED with a 2 day history of RUE and RLE weakness as well as left facial droop. Stroke was initially suspected but MRI brain was negative for acute infarction. MRI cervical, thoracic and lumbar spine was read by Radiology as exhibiting severe, extensive degenerative changes and question of a faint focus of myelomalacia within the cervical spinal cord at the level of C6. Also noted was severe spinal stenosis at L4-5.   She states that she has "severe arthritis" involving her back; in association with this she has developed, over the past month, progressively worsening right hand numbness, contractures of the digits of her right hand, right hand weakness and sensory numbness together with weakness involving her right leg, with difficulty walking.  She states that she has severe LBP with radiation down the back of her right leg. She states that one week ago she saw her orthopedist and was taken off Eliquis for 3 days to have a steroid injection in her back, which did not provide relief from her pain. Following the injection, she states that her right side has gotten worse. The weakness has increased to the point where for the last 2 days she feels like she is going to fall. She did fall last week. Of note, she lives at home alone.   Past Medical History:  Diagnosis Date  . Arthritis   . Chronic kidney disease   . Depression   . Dysrhythmia 8/13   palpitations/ now resolved  . GERD (gastroesophageal reflux disease)   . History of blood  transfusion 2012  . Hypertension   . Peripheral vascular disease (HCC)    varicose veins bilaterally   . Restless legs syndrome     Past Surgical History:  Procedure Laterality Date  . ABDOMINAL HYSTERECTOMY    . APPENDECTOMY    . EYE SURGERY Bilateral    cataract extraction with IOL  . JOINT REPLACEMENT Right    knee  . SEPTOPLASTY     repair deviated septum  . VARICOSE VEIN SURGERY Bilateral     Family History  Problem Relation Age of Onset  . Heart attack Father   . Stroke Father   . Hypertension Father   . COPD Mother   . Lung cancer Sister   . Uterine  cancer Sister   . Colon polyps Daughter   . Colon cancer Unknown    Social History:  reports that  has never smoked. she has never used smokeless tobacco. She reports that she does not drink alcohol or use drugs.  Allergies  Allergen Reactions  . Hctz [Hydrochlorothiazide]     Dizziness   . Lasix [Furosemide]     fatigue   . Penicillins Hives and Itching  . Tramadol-Acetaminophen     GI upset     MEDICATIONS:                                                                                                                     Prior to Admission:  Medications Prior to Admission  Medication Sig Dispense Refill Last Dose  . albuterol (VENTOLIN HFA) 108 (90 Base) MCG/ACT inhaler Inhale 2 puffs daily as needed into the lungs for wheezing or shortness of breath.    Past Month at Unknown time  . ALPRAZolam (XANAX) 0.25 MG tablet Take 0.25 mg at bedtime as needed by mouth for anxiety or sleep.    Past Week at Unknown time  . amiodarone (PACERONE) 200 MG tablet Take 200 mg at bedtime by mouth.    05/21/2017 at Unknown time  . amLODipine (NORVASC) 5 MG tablet Take 2.5 mg daily after breakfast by mouth.    05/21/2017 at Unknown time  . bisacodyl (DULCOLAX) 10 MG suppository Place 10 mg daily rectally.   05/20/2017 at Unknown time  . cetirizine (ZYRTEC) 10 MG tablet Take 10 mg by mouth daily.   05/21/2017 at Unknown time  .  conjugated estrogens (PREMARIN) vaginal cream Place 1 application at bedtime as needed vaginally.   Past Week at Unknown time  . Cyanocobalamin (VITAMIN B-12 IJ) Inject 1 each every 30 (thirty) days as directed.   september 2018  . estradiol (ESTRACE) 0.5 MG tablet Take 0.5 mg daily by mouth.   05/21/2017 at Unknown time  . fluticasone (FLONASE) 50 MCG/ACT nasal spray Place 1 spray daily as needed into both nostrils for allergies.    Past Month at Unknown time  . metoprolol succinate (TOPROL-XL) 100 MG 24 hr tablet Take 100 mg 2 (two) times daily by mouth.    05/21/2017 at Unknown time  . pantoprazole (PROTONIX) 40 MG tablet Take 40 mg by mouth daily.   05/21/2017 at Unknown time  . rOPINIRole (REQUIP) 0.5 MG tablet Take 1 mg by mouth at bedtime.   05/20/2017 at Unknown time  . XARELTO 15 MG TABS tablet Take 15 mg daily by mouth.    05/21/2017 at Unknown time  . acebutolol (SECTRAL) 400 MG capsule Take 400 mg by mouth daily after breakfast.    05/26/2013 at Unknown time  . acetaminophen (TYLENOL) 500 MG tablet Take 500 mg by mouth every 6 (six) hours as needed for pain.   Past Month at Unknown time  . aspirin EC 81 MG EC tablet Take 1 tablet (81 mg total) by mouth daily. 30  tablet 0   . estradiol (ESTRACE) 1 MG tablet Take 1 mg by mouth daily.   05/25/2013 at Unknown time  . gabapentin (NEURONTIN) 300 MG capsule Take 600 mg by mouth daily.   05/25/2013 at Unknown time  . gabapentin (NEURONTIN) 300 MG capsule Take 1 capsule (300 mg total) by mouth 2 (two) times daily. 90 capsule 6   . Omeprazole-Sodium Bicarbonate (ZEGERID) 20-1100 MG CAPS Take 1 capsule by mouth at bedtime.    Past Week at Unknown time   Scheduled: . amiodarone  200 mg Oral QHS  . amLODipine  2.5 mg Oral QPC breakfast  . bisacodyl  10 mg Rectal Daily  . loratadine  10 mg Oral Daily  . metoprolol succinate  100 mg Oral BID  . pantoprazole  40 mg Oral Daily  . Rivaroxaban  15 mg Oral QAC supper  . rOPINIRole  1 mg Oral QHS      ROS:                                                                                                                                       No urinary or bowel incontinence. No fever or infectious illness. No SOB, CP, abdominal pain or N/V.   Blood pressure (!) 141/62, pulse (!) 51, temperature 97.7 F (36.5 C), temperature source Oral, resp. rate 18, height 5\' 7"  (1.702 m), weight 84.7 kg (186 lb 12.8 oz), SpO2 100 %.   General Examination:                                                                                                      HEENT-  [place]/AT Lungs- Respirations unlabored Extremities- No cyanosis or pallor.   Neurological Examination Mental Status: Alert, oriented, thought content appropriate.  Speech fluent without evidence of aphasia.  Able to follow all commands without difficulty. Cranial Nerves:  II: Visual fields intact. PERRL.  III,IV, VI: ptosis not present, EOMI without nystagmus V,VII: smile symmetric, facial temp sensation intact bilaterally VIII: hearing intact to voice IX,X: Palate rises symmetrically XI: Symmetric shoulder shrug XII: midline tongue extension Motor: RUE: 4/5 proximally with 4-/5 grip strength and intrinsic muscles of the hand. Flexion contractures of digits are noted. Decreased tone of wrist and hand.  LUE: 5/5 RLE: 2/5 proximal and distal; somewhat decreased effort LLE: 3-4/5 proximal and distal.  Sensory: Temp and light touch intact proximally x 4. Severely impaired proprioception bilateral toes.  Deep Tendon Reflexes: 2+ left brachioradialis and biceps, 1+ right  brachioradialis and biceps. 2+ left patella, 1+ right patella, 0 achilles bilaterally. Toes upgoing bilaterally.  Cerebellar: No ataxia with FNF bilaterally.  Gait: Deferred due to falls risk concerns.   Lab Results: Basic Metabolic Panel: Recent Labs  Lab 05/21/17 1055  NA 133*  K 4.1  CL 101  CO2 24  GLUCOSE 111*  BUN 14  CREATININE 1.09*  CALCIUM 9.0     Liver Function Tests: No results for input(s): AST, ALT, ALKPHOS, BILITOT, PROT, ALBUMIN in the last 168 hours. No results for input(s): LIPASE, AMYLASE in the last 168 hours. No results for input(s): AMMONIA in the last 168 hours.  CBC: Recent Labs  Lab 05/21/17 1055 05/22/17 0620  WBC 4.1 6.0  HGB 14.5 14.1  HCT 42.2 41.6  MCV 90.9 91.2  PLT 198 209    Cardiac Enzymes: No results for input(s): CKTOTAL, CKMB, CKMBINDEX, TROPONINI in the last 168 hours.  Lipid Panel: No results for input(s): CHOL, TRIG, HDL, CHOLHDL, VLDL, LDLCALC in the last 168 hours.  CBG: No results for input(s): GLUCAP in the last 168 hours.  Microbiology: Results for orders placed or performed during the hospital encounter of 10/25/12  Surgical pcr screen     Status: None   Collection Time: 10/25/12  1:03 PM  Result Value Ref Range Status   MRSA, PCR NEGATIVE NEGATIVE Final   Staphylococcus aureus NEGATIVE NEGATIVE Final    Comment:        The Xpert SA Assay (FDA approved for NASAL specimens in patients over 29 years of age), is one component of a comprehensive surveillance program.  Test performance has been validated by [employer] for patients greater than or equal to 36 year old. It is not intended to diagnose infection nor to guide or monitor treatment.    Coagulation Studies: No results for input(s): LABPROT, INR in the last 72 hours.  Imaging: Mr Brain Wo Contrast  Result Date: 05/21/2017 CLINICAL DATA:  Initial evaluation for acute right upper and lower extremity weakness. EXAM: MRI HEAD WITHOUT CONTRAST MRI CERVICAL, THORACIC AND LUMBAR SPINE WITHOUT CONTRAST TECHNIQUE: Multiplanar and multiecho pulse sequences of the cervical spine, to include the craniocervical junction and cervicothoracic junction, and thoracic and lumbar spine, were obtained without intravenous contrast. COMPARISON:  Prior radiograph from 09/16/2015. FINDINGS: MRI HEAD FINDINGS Generalized age related  cerebral atrophy. Mild chronic microvascular ischemic disease. No evidence for acute or subacute ischemic infarct. Gray-white matter differentiation maintained. No encephalomalacia to suggest chronic infarction. No foci of susceptibility artifact to suggest acute or chronic intracranial hemorrhage. No mass lesion, midline shift or mass effect. No hydrocephalus. No extra-axial fluid collection. Major dural sinuses are grossly patent. Pituitary gland within normal limits. Midline structures intact and normal. Major intracranial vascular flow voids are maintained. Right vertebral artery dominant with hypoplastic left vertebral artery. Degenerative thickening of the tectorial membrane with secondary narrowing at the craniocervical junction. Bone marrow signal intensity within normal limits. No scalp soft tissue abnormality. Globes and oval soft tissues within normal limits. Patient status post lens extraction bilaterally. Mild scattered mucosal thickening within the ethmoidal air cells. Chronic right sphenoid sinusitis noted. Paranasal sinuses otherwise clear. Small bilateral mastoid effusions, slightly larger on the left, of doubtful significance. Inner ear structures normal. MRI CERVICAL SPINE FINDINGS Alignment: Straightening of the normal cervical lordosis. Trace anterolisthesis of C3 on C4. 3 mm anterolisthesis of C7 on T1. Vertebrae: Vertebral body heights maintained without evidence for acute or chronic fracture. Prominent reactive endplate changes present about the  C5-6 and C6-7 interspaces. C4 and C5 vertebral bodies partially ankylosed due to chronic degenerative height loss at the C4-5 disc. Bone marrow signal intensity within normal limits. No discrete or worrisome osseous lesions. Reactive edema about the left C4-5 facet as well as the right C2-3 through C5-6 facets due to facet arthritis, most prevalent at C5-6 on the right (series 15, image 2). Cord: Possible tiny focus of T2 signal abnormality within the  cervical spinal cord at the level of C6, which may reflect a small focus of myelomalacia (series 12, image 9). Signal intensity within the cervical spinal cord otherwise within normal limits. Cord is somewhat atrophic in appearance due to severe multifocal stenosis. Posterior Fossa, vertebral arteries, paraspinal tissues: Degenerative changes seen about the C1-2 articulation with thickening of the tectorial membrane. Superimposed 4 mm synovial cyst noted (series 12, image 9). Secondary mild narrowing at the craniocervical junction. Paraspinous soft tissues within normal limits. Normal intravascular flow voids within the vertebral arteries grossly maintained. Disc levels: C2-C3: Intervertebral disc space narrowing with disc desiccation. Bilateral uncovertebral hypertrophy with facet degeneration, greater on the left. Mild spinal stenosis. Moderate left with mild right C3 foraminal narrowing. C3-C4: Diffuse disc bulge with intervertebral disc space narrowing. Facet ligamentum flavum hypertrophy. Severe spinal stenosis with compression of the spinal cord. Thecal sac measures 4 mm in AP diameter. Severe bilateral C4 foraminal stenosis. C4-C5: Chronic disc space height loss with partial ankylosis of the C4 and C5 vertebral bodies. Broad central disc osteophyte indents the ventral thecal sac and impinges upon the ventral spinal cord. Moderate spinal stenosis. Severe bilateral C5 foraminal stenosis. C5-C6: Chronic diffuse degenerative disc osteophyte with intervertebral disc space narrowing. Facet ligamentum flavum hypertrophy. Severe canal stenosis with compression of the spinal cord. Thecal sac approximately 4 mm in AP diameter. Severe C6 foraminal stenosis. C6-C7: Chronic diffuse degenerative disc osteophyte and facet hypertrophy with resultant severe spinal stenosis. Impression of the cervical spinal cord, similar to additional levels. Severe bilateral C7 foraminal stenosis. C7-T1: Anterolisthesis. Diffuse disc bulge.  Advanced facet arthropathy bilaterally. Mild spinal stenosis. Moderate bilateral C8 foraminal narrowing. MRI THORACIC SPINE FINDINGS Alignment: Mild dextroscoliosis. Vertebral bodies otherwise normally aligned with preservation of the normal thoracic kyphosis. Trace anterolisthesis of T1 on T2 noted. Vertebrae: Vertebral body heights maintained without evidence for acute or chronic fracture. Prominent reactive endplate changes noted about the C6-7 interspace. Degenerative disc space height loss with partial ankylosis present at T7-8 anteriorly. Bone marrow signal intensity within normal limits. No worrisome osseous lesions. Cord: Signal intensity within the thoracic spinal cord is normal. The Paraspinal and other soft tissues: Paraspinous soft tissues within normal limits. Partially visualized lungs are grossly clear. Atherosclerotic change noted within the aorta. Visualized visceral structures within normal limits. Disc levels: Multilevel noncompressive disc bulging seen within the thoracic spine at nearly all levels. Note made of a right paracentral disc protrusion at T5-6 flattening the right hemi cord without cord signal changes (series 24, image 14). Left paracentral disc protrusion at T9-10 indents the left ventral thecal sac without stenosis or cord deformity (series 24, image 26). Multilevel facet arthropathy present within the lower thoracic spine, most notable at T11-12. No significant spinal stenosis. Mild to moderate bilateral foraminal narrowing present at T2-3 and T10-11. No other significant foraminal encroachment. MRI LUMBAR SPINE FINDINGS Segmentation:  Normal. Alignment: Straightening with reversal of the normal lumbar lordosis. Chronic mild retrolisthesis of L1 on L2 through L5-S1. Vertebrae: Vertebral body heights maintained without evidence for acute or chronic fracture. Prominent reactive  endplate changes present about the L1-2 and L4-5 interspaces. Bone marrow signal intensity normal. No  discrete or worrisome osseous lesions. Conus medullaris: Mildly low lying extending to the L2-3 level. Distal spinal cord and conus are normal in appearance. Paraspinal and other soft tissues: Paraspinous soft tissues demonstrate no acute abnormality. Chronic fatty atrophy noted within the lower paraspinous musculature. Visualized visceral structures within normal limits. Disc levels: L1-2: Diffuse disc bulge with disc desiccation and intervertebral disc space narrowing. Disc bulging eccentric to the right with associated right far lateral reactive endplate changes. Mild right lateral recess narrowing without significant canal stenosis. Foramina are patent. L2-3: Chronic intervertebral disc space narrowing with diffuse disc bulge and reactive endplate changes. Moderate facet hypertrophy. Resultant mild canal with moderate bilateral lateral recess narrowing. Moderate bilateral L2 foraminal stenosis. L3-4: Diffuse disc bulge with intervertebral disc space narrowing. Moderate facet arthrosis with ligamentum flavum hypertrophy. Resultant moderate to severe canal and bilateral subarticular stenosis. Thecal sac measures 8 mm in AP diameter. Moderate bilateral L3 foraminal stenosis, slightly worse on the right. L4-5: Chronic intervertebral disc space narrowing with diffuse disc bulge. Moderate to advanced facet arthrosis with ligamentum flavum hypertrophy. Severe canal and bilateral subarticular stenosis. Thecal sac measures approximately 4-5 mm in AP diameter. Moderate bilateral L4 foraminal stenosis. L5-S1: Diffuse disc bulge with chronic intervertebral disc space narrowing. Superimposed small central disc protrusion indents the ventral thecal sac. Mild facet hypertrophy. No significant canal stenosis. Mild right L5 foraminal narrowing. IMPRESSION: MRI HEAD SPINE IMPRESSION: 1. No acute intracranial process identified. 2. Age-related cerebral atrophy with mild chronic small vessel ischemic disease. 3. Chronic right  sphenoid sinusitis. MRI CERVICAL SPINE IMPRESSION: 1. 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 2. Question faint focus of myelomalacia within the cervical spinal cord at the level of C6 as above. 3. Multifactorial degenerative changes with resultant severe multilevel foraminal narrowing as above, severe bilaterally at C3-4 through C6-7. 4. Right greater than left facet arthritis with associated reactive edema as above. Finding could serve as a source for neck pain. MRI THORACIC SPINE IMPRESSION: 1. Multilevel noncompressive disc bulging throughout the thoracic spine without significant canal stenosis. Superimposed small disc protrusions at T5-6 and T9-10 as above. 2. Multilevel facet arthropathy within the lower thoracic spine, most prevalent at T11-12. 3. Prominent discogenic reactive endplate changes at C6-7. MRI LUMBAR SPINE IMPRESSION: 1. Multilevel degenerative disc disease and facet arthrosis with resulting canal stenosis at L2-3 through L4-5. Changes most pronounced at the L4-5 level were there is severe spinal stenosis. 2. Multifactorial degenerative changes with resultant moderate multilevel foraminal narrowing at L2-3 through L4-5 bilaterally. Electronically Signed   By: Rise Mu M.D.   On: 05/21/2017 22:44   Mr Cervical Spine Wo Contrast  Result Date: 05/21/2017 CLINICAL DATA:  Initial evaluation for acute right upper and lower extremity weakness. EXAM: MRI HEAD WITHOUT CONTRAST MRI CERVICAL, THORACIC AND LUMBAR SPINE WITHOUT CONTRAST TECHNIQUE: Multiplanar and multiecho pulse sequences of the cervical spine, to include the craniocervical junction and cervicothoracic junction, and thoracic and lumbar spine, were obtained without intravenous contrast. COMPARISON:  Prior radiograph from 09/16/2015. FINDINGS: MRI HEAD FINDINGS Generalized age related cerebral atrophy. Mild chronic microvascular ischemic disease. No  evidence for acute or subacute ischemic infarct. Gray-white matter differentiation maintained. No encephalomalacia to suggest chronic infarction. No foci of susceptibility artifact to suggest acute or chronic intracranial hemorrhage. No mass lesion, midline shift or mass effect. No hydrocephalus. No extra-axial fluid collection. Major  dural sinuses are grossly patent. Pituitary gland within normal limits. Midline structures intact and normal. Major intracranial vascular flow voids are maintained. Right vertebral artery dominant with hypoplastic left vertebral artery. Degenerative thickening of the tectorial membrane with secondary narrowing at the craniocervical junction. Bone marrow signal intensity within normal limits. No scalp soft tissue abnormality. Globes and oval soft tissues within normal limits. Patient status post lens extraction bilaterally. Mild scattered mucosal thickening within the ethmoidal air cells. Chronic right sphenoid sinusitis noted. Paranasal sinuses otherwise clear. Small bilateral mastoid effusions, slightly larger on the left, of doubtful significance. Inner ear structures normal. MRI CERVICAL SPINE FINDINGS Alignment: Straightening of the normal cervical lordosis. Trace anterolisthesis of C3 on C4. 3 mm anterolisthesis of C7 on T1. Vertebrae: Vertebral body heights maintained without evidence for acute or chronic fracture. Prominent reactive endplate changes present about the C5-6 and C6-7 interspaces. C4 and C5 vertebral bodies partially ankylosed due to chronic degenerative height loss at the C4-5 disc. Bone marrow signal intensity within normal limits. No discrete or worrisome osseous lesions. Reactive edema about the left C4-5 facet as well as the right C2-3 through C5-6 facets due to facet arthritis, most prevalent at C5-6 on the right (series 15, image 2). Cord: Possible tiny focus of T2 signal abnormality within the cervical spinal cord at the level of C6, which may reflect a  small focus of myelomalacia (series 12, image 9). Signal intensity within the cervical spinal cord otherwise within normal limits. Cord is somewhat atrophic in appearance due to severe multifocal stenosis. Posterior Fossa, vertebral arteries, paraspinal tissues: Degenerative changes seen about the C1-2 articulation with thickening of the tectorial membrane. Superimposed 4 mm synovial cyst noted (series 12, image 9). Secondary mild narrowing at the craniocervical junction. Paraspinous soft tissues within normal limits. Normal intravascular flow voids within the vertebral arteries grossly maintained. Disc levels: C2-C3: Intervertebral disc space narrowing with disc desiccation. Bilateral uncovertebral hypertrophy with facet degeneration, greater on the left. Mild spinal stenosis. Moderate left with mild right C3 foraminal narrowing. C3-C4: Diffuse disc bulge with intervertebral disc space narrowing. Facet ligamentum flavum hypertrophy. Severe spinal stenosis with compression of the spinal cord. Thecal sac measures 4 mm in AP diameter. Severe bilateral C4 foraminal stenosis. C4-C5: Chronic disc space height loss with partial ankylosis of the C4 and C5 vertebral bodies. Broad central disc osteophyte indents the ventral thecal sac and impinges upon the ventral spinal cord. Moderate spinal stenosis. Severe bilateral C5 foraminal stenosis. C5-C6: Chronic diffuse degenerative disc osteophyte with intervertebral disc space narrowing. Facet ligamentum flavum hypertrophy. Severe canal stenosis with compression of the spinal cord. Thecal sac approximately 4 mm in AP diameter. Severe C6 foraminal stenosis. C6-C7: Chronic diffuse degenerative disc osteophyte and facet hypertrophy with resultant severe spinal stenosis. Impression of the cervical spinal cord, similar to additional levels. Severe bilateral C7 foraminal stenosis. C7-T1: Anterolisthesis. Diffuse disc bulge. Advanced facet arthropathy bilaterally. Mild spinal stenosis.  Moderate bilateral C8 foraminal narrowing. MRI THORACIC SPINE FINDINGS Alignment: Mild dextroscoliosis. Vertebral bodies otherwise normally aligned with preservation of the normal thoracic kyphosis. Trace anterolisthesis of T1 on T2 noted. Vertebrae: Vertebral body heights maintained without evidence for acute or chronic fracture. Prominent reactive endplate changes noted about the C6-7 interspace. Degenerative disc space height loss with partial ankylosis present at T7-8 anteriorly. Bone marrow signal intensity within normal limits. No worrisome osseous lesions. Cord: Signal intensity within the thoracic spinal cord is normal. The Paraspinal and other soft tissues: Paraspinous soft tissues within normal limits. Partially visualized  lungs are grossly clear. Atherosclerotic change noted within the aorta. Visualized visceral structures within normal limits. Disc levels: Multilevel noncompressive disc bulging seen within the thoracic spine at nearly all levels. Note made of a right paracentral disc protrusion at T5-6 flattening the right hemi cord without cord signal changes (series 24, image 14). Left paracentral disc protrusion at T9-10 indents the left ventral thecal sac without stenosis or cord deformity (series 24, image 26). Multilevel facet arthropathy present within the lower thoracic spine, most notable at T11-12. No significant spinal stenosis. Mild to moderate bilateral foraminal narrowing present at T2-3 and T10-11. No other significant foraminal encroachment. MRI LUMBAR SPINE FINDINGS Segmentation:  Normal. Alignment: Straightening with reversal of the normal lumbar lordosis. Chronic mild retrolisthesis of L1 on L2 through L5-S1. Vertebrae: Vertebral body heights maintained without evidence for acute or chronic fracture. Prominent reactive endplate changes present about the L1-2 and L4-5 interspaces. Bone marrow signal intensity normal. No discrete or worrisome osseous lesions. Conus medullaris: Mildly low  lying extending to the L2-3 level. Distal spinal cord and conus are normal in appearance. Paraspinal and other soft tissues: Paraspinous soft tissues demonstrate no acute abnormality. Chronic fatty atrophy noted within the lower paraspinous musculature. Visualized visceral structures within normal limits. Disc levels: L1-2: Diffuse disc bulge with disc desiccation and intervertebral disc space narrowing. Disc bulging eccentric to the right with associated right far lateral reactive endplate changes. Mild right lateral recess narrowing without significant canal stenosis. Foramina are patent. L2-3: Chronic intervertebral disc space narrowing with diffuse disc bulge and reactive endplate changes. Moderate facet hypertrophy. Resultant mild canal with moderate bilateral lateral recess narrowing. Moderate bilateral L2 foraminal stenosis. L3-4: Diffuse disc bulge with intervertebral disc space narrowing. Moderate facet arthrosis with ligamentum flavum hypertrophy. Resultant moderate to severe canal and bilateral subarticular stenosis. Thecal sac measures 8 mm in AP diameter. Moderate bilateral L3 foraminal stenosis, slightly worse on the right. L4-5: Chronic intervertebral disc space narrowing with diffuse disc bulge. Moderate to advanced facet arthrosis with ligamentum flavum hypertrophy. Severe canal and bilateral subarticular stenosis. Thecal sac measures approximately 4-5 mm in AP diameter. Moderate bilateral L4 foraminal stenosis. L5-S1: Diffuse disc bulge with chronic intervertebral disc space narrowing. Superimposed small central disc protrusion indents the ventral thecal sac. Mild facet hypertrophy. No significant canal stenosis. Mild right L5 foraminal narrowing. IMPRESSION: MRI HEAD SPINE IMPRESSION: 1. No acute intracranial process identified. 2. Age-related cerebral atrophy with mild chronic small vessel ischemic disease. 3. Chronic right sphenoid sinusitis. MRI CERVICAL SPINE IMPRESSION: 1. 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 2. Question faint focus of myelomalacia within the cervical spinal cord at the level of C6 as above. 3. Multifactorial degenerative changes with resultant severe multilevel foraminal narrowing as above, severe bilaterally at C3-4 through C6-7. 4. Right greater than left facet arthritis with associated reactive edema as above. Finding could serve as a source for neck pain. MRI THORACIC SPINE IMPRESSION: 1. Multilevel noncompressive disc bulging throughout the thoracic spine without significant canal stenosis. Superimposed small disc protrusions at T5-6 and T9-10 as above. 2. Multilevel facet arthropathy within the lower thoracic spine, most prevalent at T11-12. 3. Prominent discogenic reactive endplate changes at C6-7. MRI LUMBAR SPINE IMPRESSION: 1. Multilevel degenerative disc disease and facet arthrosis with resulting canal stenosis at L2-3 through L4-5. Changes most pronounced at the L4-5 level were there is severe spinal stenosis. 2. Multifactorial degenerative changes with resultant moderate multilevel foraminal narrowing at L2-3 through  L4-5 bilaterally. Electronically Signed   By: Rise Mu M.D.   On: 05/21/2017 22:44   Mr Thoracic Spine Wo Contrast  Result Date: 05/21/2017 CLINICAL DATA:  Initial evaluation for acute right upper and lower extremity weakness. EXAM: MRI HEAD WITHOUT CONTRAST MRI CERVICAL, THORACIC AND LUMBAR SPINE WITHOUT CONTRAST TECHNIQUE: Multiplanar and multiecho pulse sequences of the cervical spine, to include the craniocervical junction and cervicothoracic junction, and thoracic and lumbar spine, were obtained without intravenous contrast. COMPARISON:  Prior radiograph from 09/16/2015. FINDINGS: MRI HEAD FINDINGS Generalized age related cerebral atrophy. Mild chronic microvascular ischemic disease. No evidence for acute or subacute ischemic infarct. Gray-white matter  differentiation maintained. No encephalomalacia to suggest chronic infarction. No foci of susceptibility artifact to suggest acute or chronic intracranial hemorrhage. No mass lesion, midline shift or mass effect. No hydrocephalus. No extra-axial fluid collection. Major dural sinuses are grossly patent. Pituitary gland within normal limits. Midline structures intact and normal. Major intracranial vascular flow voids are maintained. Right vertebral artery dominant with hypoplastic left vertebral artery. Degenerative thickening of the tectorial membrane with secondary narrowing at the craniocervical junction. Bone marrow signal intensity within normal limits. No scalp soft tissue abnormality. Globes and oval soft tissues within normal limits. Patient status post lens extraction bilaterally. Mild scattered mucosal thickening within the ethmoidal air cells. Chronic right sphenoid sinusitis noted. Paranasal sinuses otherwise clear. Small bilateral mastoid effusions, slightly larger on the left, of doubtful significance. Inner ear structures normal. MRI CERVICAL SPINE FINDINGS Alignment: Straightening of the normal cervical lordosis. Trace anterolisthesis of C3 on C4. 3 mm anterolisthesis of C7 on T1. Vertebrae: Vertebral body heights maintained without evidence for acute or chronic fracture. Prominent reactive endplate changes present about the C5-6 and C6-7 interspaces. C4 and C5 vertebral bodies partially ankylosed due to chronic degenerative height loss at the C4-5 disc. Bone marrow signal intensity within normal limits. No discrete or worrisome osseous lesions. Reactive edema about the left C4-5 facet as well as the right C2-3 through C5-6 facets due to facet arthritis, most prevalent at C5-6 on the right (series 15, image 2). Cord: Possible tiny focus of T2 signal abnormality within the cervical spinal cord at the level of C6, which may reflect a small focus of myelomalacia (series 12, image 9). Signal intensity  within the cervical spinal cord otherwise within normal limits. Cord is somewhat atrophic in appearance due to severe multifocal stenosis. Posterior Fossa, vertebral arteries, paraspinal tissues: Degenerative changes seen about the C1-2 articulation with thickening of the tectorial membrane. Superimposed 4 mm synovial cyst noted (series 12, image 9). Secondary mild narrowing at the craniocervical junction. Paraspinous soft tissues within normal limits. Normal intravascular flow voids within the vertebral arteries grossly maintained. Disc levels: C2-C3: Intervertebral disc space narrowing with disc desiccation. Bilateral uncovertebral hypertrophy with facet degeneration, greater on the left. Mild spinal stenosis. Moderate left with mild right C3 foraminal narrowing. C3-C4: Diffuse disc bulge with intervertebral disc space narrowing. Facet ligamentum flavum hypertrophy. Severe spinal stenosis with compression of the spinal cord. Thecal sac measures 4 mm in AP diameter. Severe bilateral C4 foraminal stenosis. C4-C5: Chronic disc space height loss with partial ankylosis of the C4 and C5 vertebral bodies. Broad central disc osteophyte indents the ventral thecal sac and impinges upon the ventral spinal cord. Moderate spinal stenosis. Severe bilateral C5 foraminal stenosis. C5-C6: Chronic diffuse degenerative disc osteophyte with intervertebral disc space narrowing. Facet ligamentum flavum hypertrophy. Severe canal stenosis with compression of the spinal cord. Thecal sac approximately 4 mm in  AP diameter. Severe C6 foraminal stenosis. C6-C7: Chronic diffuse degenerative disc osteophyte and facet hypertrophy with resultant severe spinal stenosis. Impression of the cervical spinal cord, similar to additional levels. Severe bilateral C7 foraminal stenosis. C7-T1: Anterolisthesis. Diffuse disc bulge. Advanced facet arthropathy bilaterally. Mild spinal stenosis. Moderate bilateral C8 foraminal narrowing. MRI THORACIC SPINE  FINDINGS Alignment: Mild dextroscoliosis. Vertebral bodies otherwise normally aligned with preservation of the normal thoracic kyphosis. Trace anterolisthesis of T1 on T2 noted. Vertebrae: Vertebral body heights maintained without evidence for acute or chronic fracture. Prominent reactive endplate changes noted about the C6-7 interspace. Degenerative disc space height loss with partial ankylosis present at T7-8 anteriorly. Bone marrow signal intensity within normal limits. No worrisome osseous lesions. Cord: Signal intensity within the thoracic spinal cord is normal. The Paraspinal and other soft tissues: Paraspinous soft tissues within normal limits. Partially visualized lungs are grossly clear. Atherosclerotic change noted within the aorta. Visualized visceral structures within normal limits. Disc levels: Multilevel noncompressive disc bulging seen within the thoracic spine at nearly all levels. Note made of a right paracentral disc protrusion at T5-6 flattening the right hemi cord without cord signal changes (series 24, image 14). Left paracentral disc protrusion at T9-10 indents the left ventral thecal sac without stenosis or cord deformity (series 24, image 26). Multilevel facet arthropathy present within the lower thoracic spine, most notable at T11-12. No significant spinal stenosis. Mild to moderate bilateral foraminal narrowing present at T2-3 and T10-11. No other significant foraminal encroachment. MRI LUMBAR SPINE FINDINGS Segmentation:  Normal. Alignment: Straightening with reversal of the normal lumbar lordosis. Chronic mild retrolisthesis of L1 on L2 through L5-S1. Vertebrae: Vertebral body heights maintained without evidence for acute or chronic fracture. Prominent reactive endplate changes present about the L1-2 and L4-5 interspaces. Bone marrow signal intensity normal. No discrete or worrisome osseous lesions. Conus medullaris: Mildly low lying extending to the L2-3 level. Distal spinal cord and  conus are normal in appearance. Paraspinal and other soft tissues: Paraspinous soft tissues demonstrate no acute abnormality. Chronic fatty atrophy noted within the lower paraspinous musculature. Visualized visceral structures within normal limits. Disc levels: L1-2: Diffuse disc bulge with disc desiccation and intervertebral disc space narrowing. Disc bulging eccentric to the right with associated right far lateral reactive endplate changes. Mild right lateral recess narrowing without significant canal stenosis. Foramina are patent. L2-3: Chronic intervertebral disc space narrowing with diffuse disc bulge and reactive endplate changes. Moderate facet hypertrophy. Resultant mild canal with moderate bilateral lateral recess narrowing. Moderate bilateral L2 foraminal stenosis. L3-4: Diffuse disc bulge with intervertebral disc space narrowing. Moderate facet arthrosis with ligamentum flavum hypertrophy. Resultant moderate to severe canal and bilateral subarticular stenosis. Thecal sac measures 8 mm in AP diameter. Moderate bilateral L3 foraminal stenosis, slightly worse on the right. L4-5: Chronic intervertebral disc space narrowing with diffuse disc bulge. Moderate to advanced facet arthrosis with ligamentum flavum hypertrophy. Severe canal and bilateral subarticular stenosis. Thecal sac measures approximately 4-5 mm in AP diameter. Moderate bilateral L4 foraminal stenosis. L5-S1: Diffuse disc bulge with chronic intervertebral disc space narrowing. Superimposed small central disc protrusion indents the ventral thecal sac. Mild facet hypertrophy. No significant canal stenosis. Mild right L5 foraminal narrowing. IMPRESSION: MRI HEAD SPINE IMPRESSION: 1. No acute intracranial process identified. 2. Age-related cerebral atrophy with mild chronic small vessel ischemic disease. 3. Chronic right sphenoid sinusitis. MRI CERVICAL SPINE IMPRESSION: 1. Severe cervical spondylolysis with resultant severe diffuse canal stenosis,  most severe at C3-4, C5-6, and C6-7. 2. Question faint focus of  myelomalacia within the cervical spinal cord at the level of C6 as above. 3. Multifactorial degenerative changes with resultant severe multilevel foraminal narrowing as above, severe bilaterally at C3-4 through C6-7. 4. Right greater than left facet arthritis with associated reactive edema as above. Finding could serve as a source for neck pain. MRI THORACIC SPINE IMPRESSION: 1. Multilevel noncompressive disc bulging throughout the thoracic spine without significant canal stenosis. Superimposed small disc protrusions at T5-6 and T9-10 as above. 2. Multilevel facet arthropathy within the lower thoracic spine, most prevalent at T11-12. 3. Prominent discogenic reactive endplate changes at C6-7. MRI LUMBAR SPINE IMPRESSION: 1. Multilevel degenerative disc disease and facet arthrosis with resulting canal stenosis at L2-3 through L4-5. Changes most pronounced at the L4-5 level were there is severe spinal stenosis. 2. Multifactorial degenerative changes with resultant moderate multilevel foraminal narrowing at L2-3 through L4-5 bilaterally. Electronically Signed   By: Rise MuBenjamin  McClintock M.D.   On: 05/21/2017 22:44   Mr Lumbar Spine Wo Contrast  Result Date: 05/21/2017 CLINICAL DATA:  Initial evaluation for acute right upper and lower extremity weakness. EXAM: MRI HEAD WITHOUT CONTRAST MRI CERVICAL, THORACIC AND LUMBAR SPINE WITHOUT CONTRAST TECHNIQUE: Multiplanar and multiecho pulse sequences of the cervical spine, to include the craniocervical junction and cervicothoracic junction, and thoracic and lumbar spine, were obtained without intravenous contrast. COMPARISON:  Prior radiograph from 09/16/2015. FINDINGS: MRI HEAD FINDINGS Generalized age related cerebral atrophy. Mild chronic microvascular ischemic disease. No evidence for acute or subacute ischemic infarct. Gray-white matter differentiation maintained. No encephalomalacia to suggest chronic  infarction. No foci of susceptibility artifact to suggest acute or chronic intracranial hemorrhage. No mass lesion, midline shift or mass effect. No hydrocephalus. No extra-axial fluid collection. Major dural sinuses are grossly patent. Pituitary gland within normal limits. Midline structures intact and normal. Major intracranial vascular flow voids are maintained. Right vertebral artery dominant with hypoplastic left vertebral artery. Degenerative thickening of the tectorial membrane with secondary narrowing at the craniocervical junction. Bone marrow signal intensity within normal limits. No scalp soft tissue abnormality. Globes and oval soft tissues within normal limits. Patient status post lens extraction bilaterally. Mild scattered mucosal thickening within the ethmoidal air cells. Chronic right sphenoid sinusitis noted. Paranasal sinuses otherwise clear. Small bilateral mastoid effusions, slightly larger on the left, of doubtful significance. Inner ear structures normal. MRI CERVICAL SPINE FINDINGS Alignment: Straightening of the normal cervical lordosis. Trace anterolisthesis of C3 on C4. 3 mm anterolisthesis of C7 on T1. Vertebrae: Vertebral body heights maintained without evidence for acute or chronic fracture. Prominent reactive endplate changes present about the C5-6 and C6-7 interspaces. C4 and C5 vertebral bodies partially ankylosed due to chronic degenerative height loss at the C4-5 disc. Bone marrow signal intensity within normal limits. No discrete or worrisome osseous lesions. Reactive edema about the left C4-5 facet as well as the right C2-3 through C5-6 facets due to facet arthritis, most prevalent at C5-6 on the right (series 15, image 2). Cord: Possible tiny focus of T2 signal abnormality within the cervical spinal cord at the level of C6, which may reflect a small focus of myelomalacia (series 12, image 9). Signal intensity within the cervical spinal cord otherwise within normal limits. Cord is  somewhat atrophic in appearance due to severe multifocal stenosis. Posterior Fossa, vertebral arteries, paraspinal tissues: Degenerative changes seen about the C1-2 articulation with thickening of the tectorial membrane. Superimposed 4 mm synovial cyst noted (series 12, image 9). Secondary mild narrowing at the craniocervical junction. Paraspinous soft tissues within normal  limits. Normal intravascular flow voids within the vertebral arteries grossly maintained. Disc levels: C2-C3: Intervertebral disc space narrowing with disc desiccation. Bilateral uncovertebral hypertrophy with facet degeneration, greater on the left. Mild spinal stenosis. Moderate left with mild right C3 foraminal narrowing. C3-C4: Diffuse disc bulge with intervertebral disc space narrowing. Facet ligamentum flavum hypertrophy. Severe spinal stenosis with compression of the spinal cord. Thecal sac measures 4 mm in AP diameter. Severe bilateral C4 foraminal stenosis. C4-C5: Chronic disc space height loss with partial ankylosis of the C4 and C5 vertebral bodies. Broad central disc osteophyte indents the ventral thecal sac and impinges upon the ventral spinal cord. Moderate spinal stenosis. Severe bilateral C5 foraminal stenosis. C5-C6: Chronic diffuse degenerative disc osteophyte with intervertebral disc space narrowing. Facet ligamentum flavum hypertrophy. Severe canal stenosis with compression of the spinal cord. Thecal sac approximately 4 mm in AP diameter. Severe C6 foraminal stenosis. C6-C7: Chronic diffuse degenerative disc osteophyte and facet hypertrophy with resultant severe spinal stenosis. Impression of the cervical spinal cord, similar to additional levels. Severe bilateral C7 foraminal stenosis. C7-T1: Anterolisthesis. Diffuse disc bulge. Advanced facet arthropathy bilaterally. Mild spinal stenosis. Moderate bilateral C8 foraminal narrowing. MRI THORACIC SPINE FINDINGS Alignment: Mild dextroscoliosis. Vertebral bodies otherwise  normally aligned with preservation of the normal thoracic kyphosis. Trace anterolisthesis of T1 on T2 noted. Vertebrae: Vertebral body heights maintained without evidence for acute or chronic fracture. Prominent reactive endplate changes noted about the C6-7 interspace. Degenerative disc space height loss with partial ankylosis present at T7-8 anteriorly. Bone marrow signal intensity within normal limits. No worrisome osseous lesions. Cord: Signal intensity within the thoracic spinal cord is normal. The Paraspinal and other soft tissues: Paraspinous soft tissues within normal limits. Partially visualized lungs are grossly clear. Atherosclerotic change noted within the aorta. Visualized visceral structures within normal limits. Disc levels: Multilevel noncompressive disc bulging seen within the thoracic spine at nearly all levels. Note made of a right paracentral disc protrusion at T5-6 flattening the right hemi cord without cord signal changes (series 24, image 14). Left paracentral disc protrusion at T9-10 indents the left ventral thecal sac without stenosis or cord deformity (series 24, image 26). Multilevel facet arthropathy present within the lower thoracic spine, most notable at T11-12. No significant spinal stenosis. Mild to moderate bilateral foraminal narrowing present at T2-3 and T10-11. No other significant foraminal encroachment. MRI LUMBAR SPINE FINDINGS Segmentation:  Normal. Alignment: Straightening with reversal of the normal lumbar lordosis. Chronic mild retrolisthesis of L1 on L2 through L5-S1. Vertebrae: Vertebral body heights maintained without evidence for acute or chronic fracture. Prominent reactive endplate changes present about the L1-2 and L4-5 interspaces. Bone marrow signal intensity normal. No discrete or worrisome osseous lesions. Conus medullaris: Mildly low lying extending to the L2-3 level. Distal spinal cord and conus are normal in appearance. Paraspinal and other soft tissues:  Paraspinous soft tissues demonstrate no acute abnormality. Chronic fatty atrophy noted within the lower paraspinous musculature. Visualized visceral structures within normal limits. Disc levels: L1-2: Diffuse disc bulge with disc desiccation and intervertebral disc space narrowing. Disc bulging eccentric to the right with associated right far lateral reactive endplate changes. Mild right lateral recess narrowing without significant canal stenosis. Foramina are patent. L2-3: Chronic intervertebral disc space narrowing with diffuse disc bulge and reactive endplate changes. Moderate facet hypertrophy. Resultant mild canal with moderate bilateral lateral recess narrowing. Moderate bilateral L2 foraminal stenosis. L3-4: Diffuse disc bulge with intervertebral disc space narrowing. Moderate facet arthrosis with ligamentum flavum hypertrophy. Resultant moderate to severe canal  and bilateral subarticular stenosis. Thecal sac measures 8 mm in AP diameter. Moderate bilateral L3 foraminal stenosis, slightly worse on the right. L4-5: Chronic intervertebral disc space narrowing with diffuse disc bulge. Moderate to advanced facet arthrosis with ligamentum flavum hypertrophy. Severe canal and bilateral subarticular stenosis. Thecal sac measures approximately 4-5 mm in AP diameter. Moderate bilateral L4 foraminal stenosis. L5-S1: Diffuse disc bulge with chronic intervertebral disc space narrowing. Superimposed small central disc protrusion indents the ventral thecal sac. Mild facet hypertrophy. No significant canal stenosis. Mild right L5 foraminal narrowing. IMPRESSION: MRI HEAD SPINE IMPRESSION: 1. No acute intracranial process identified. 2. Age-related cerebral atrophy with mild chronic small vessel ischemic disease. 3. Chronic right sphenoid sinusitis. MRI CERVICAL SPINE IMPRESSION: 1. 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 2. Question faint focus of  myelomalacia within the cervical spinal cord at the level of C6 as above. 3. Multifactorial degenerative changes with resultant severe multilevel foraminal narrowing as above, severe bilaterally at C3-4 through C6-7. 4. Right greater than left facet arthritis with associated reactive edema as above. Finding could serve as a source for neck pain. MRI THORACIC SPINE IMPRESSION: 1. Multilevel noncompressive disc bulging throughout the thoracic spine without significant canal stenosis. Superimposed small disc protrusions at T5-6 and T9-10 as above. 2. Multilevel facet arthropathy within the lower thoracic spine, most prevalent at T11-12. 3. Prominent discogenic reactive endplate changes at C6-7. MRI LUMBAR SPINE IMPRESSION: 1. Multilevel degenerative disc disease and facet arthrosis with resulting canal stenosis at L2-3 through L4-5. Changes most pronounced at the L4-5 level were there is severe spinal stenosis. 2. Multifactorial degenerative changes with resultant moderate multilevel foraminal narrowing at L2-3 through L4-5 bilaterally. Electronically Signed   By: Rise Mu M.D.   On: 05/21/2017 22:44    Assessment: 83 year old female with a 4 week history of worsened lower extremity weakness, right worse than left, in conjunction with progressive right arm and hand weakness.  1. The patient also complains of worsening low back pain with radiation down the back of her right leg. Symptoms are suggestive of right sided sciatica and are in concordance with the severe spinal stenosis with cauda equina compression versus impingement seen on MRI L-spine at the L4-5 level.  2. Images from her MRI of cervical spine personally reviewed. There are chronic disc protrusions at the C3-4 through C6-7 levels, with associated impingement and/or compression of the spinal cord at these levels. Also noted is subtle myelomalacic signal bilaterally at some levels as well as cord flattening and atrophy.  3. The above  findings most likely also have an acute component, but it is not possible to separate possible acute lesion from the chronic findings based on the images. 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 Possible focal exiting right L5 or S1 compression as it traverses the neural foramen also a consideration.  4. Stroke has been ruled out with MRI brain.  5. Abnormal proprioception at toes. Most likely due to cervical cord compressions and/or a neuropathy. Dorsal column dysfunction due to B12 or copper deficiency is also a consideration.   Recommendations: 1. The patient has agreed to be evaluated by Neurosurgery. She initially stated that she did not want to consider surgery, but after discussion of the MRI findings as it relates to her symptoms as well as some education regarding normal and abnormal spine  anatomy, she would like to consider an operative intervention.  2. Continue rivaroxaban for stroke prevention in the setting of atrial fibrillation.  3. Serum B12 and copper levels.  4. PT/OT  Electronically signed: Dr. Caryl Pina 05/22/2017, 7:44 AM

## 2017-05-22 NOTE — Evaluation (Signed)
Speech Language Pathology Evaluation Patient Details Name: Lisa GamblerDorothy W Solomon MRN: [id number] DOB: 08/28/1927 Today's Date: 05/22/2017 Time: 1001-1030 SLP Time Calculation (min) (ACUTE ONLY): 29 min  Problem List:  Patient Active Problem List   Diagnosis Date Noted  . Right sided weakness 05/22/2017  . TIA (transient ischemic attack) 05/26/2013  . HTN (hypertension), malignant 05/26/2013  . Hyponatremia 05/26/2013  . Neuropathic pain 05/26/2013  . Restless leg syndrome 05/26/2013  . OA (osteoarthritis) of knee 11/07/2012  . EUSTACHIAN TUBE DYSFUNCTION 05/30/2007  . ALLERGIC RHINITIS 05/30/2007   Past Medical History:  Past Medical History:  Diagnosis Date  . Arthritis   . Chronic kidney disease   . Depression   . Dysrhythmia 8/13   palpitations/ now resolved  . GERD (gastroesophageal reflux disease)   . History of blood transfusion 2012  . Hypertension   . Peripheral vascular disease (HCC)    varicose veins bilaterally   . Restless legs syndrome    Past Surgical History:  Past Surgical History:  Procedure Laterality Date  . ABDOMINAL HYSTERECTOMY    . APPENDECTOMY    . EYE SURGERY Bilateral    cataract extraction with IOL  . JOINT REPLACEMENT Right    knee  . SEPTOPLASTY     repair deviated septum  . VARICOSE VEIN SURGERY Bilateral    HPI:  83 y.o. female, with past medical history significant for chronic kidney disease , osteoarthritis , arthritis of the spine  and atrial fibrillation on Eliquis presenting on 05/21/17 with 2 week history of RUE/LUE numbness and weakness that the patient reports  got worse after her steroid spinal injection completed at the orthopedics office. Patient has history of severe osteoarthritis and she had bilateral knee replacements and still have some hip pain. Patient's right-sided weakness increased and now has a right hand contracture; no concerns with swallowing per pt/nursing.  MRI head 05/21/17 indicated No acute intracranial process  identified. 2. Age-related cerebral atrophy with mild chronic small vessel ischemic disease. 3. Chronic right sphenoid sinusitis  Assessment / Plan / Recommendation Clinical Impression   Pt administered portions of the MOCA [facility](Montreal Cognitive Assessment) with results indicating 23/30 with writing portion unable to be administered d/t right hand contracture and pt is right-handed.  Memory deficits noted with word recall as pt able to recall given multiple choice and/or categorization cues only, but pain may have been a factor with this task as she was awaiting pain meds during SLE; Ox4 and auditory comprehension and verbal expression all within functional limits during SLE.  Pt received an 8th grade education and retired approximately 5 yrs ago. OME unremarkable. Pt is functioning within normal limits for language, speech and cognition given age and education level.  No f/u ST indicated at this time.  ST will s/o; thank you for this consult.    SLP Assessment  SLP Recommendation/Assessment: Patient needs continued Speech Language Pathology Services SLP Visit Diagnosis: Cognitive communication deficit (R41.841)    Follow Up Recommendations  24 hour supervision/assistance    Frequency and Duration   n/a        SLP Evaluation Cognition  Overall Cognitive Status: Within Functional Limits for tasks assessed Arousal/Alertness: Awake/alert Orientation Level: Oriented X4 Memory: Impaired Memory Impairment: Retrieval deficit Awareness: Appears intact Problem Solving: Appears intact Safety/Judgment: Appears intact       Comprehension  Auditory Comprehension Overall Auditory Comprehension: Appears within functional limits for tasks assessed Yes/No Questions: Within Functional Limits Commands: Within Functional Limits Conversation: Other (comment) Interfering Components: Pain  Visual Recognition/Discrimination Discrimination: Within Function Limits Reading Comprehension Reading Status:  Within funtional limits    Expression Expression Primary Mode of Expression: Verbal Verbal Expression Overall Verbal Expression: Appears within functional limits for tasks assessed Initiation: No impairment Level of Generative/Spontaneous Verbalization: Conversation Repetition: No impairment Naming: No impairment Pragmatics: No impairment Non-Verbal Means of Communication: Not applicable Written Expression Dominant Hand: Right Written Expression: Unable to assess (comment)(right hand contracture)   Oral / Motor  Oral Motor/Sensory Function Overall Oral Motor/Sensory Function: Within functional limits Motor Speech Overall Motor Speech: Appears within functional limits for tasks assessed Respiration: Within functional limits Phonation: Normal Resonance: Within functional limits Articulation: Within functional limitis Intelligibility: Intelligible Motor Planning: Witnin functional limits Motor Speech Errors: Not applicable            Functional Assessment Tool Used: NOMS; clinical judgment Functional Limitations: Memory Memory Current Status ([id number]([id number]): At least 1 percent but less than 20 percent impaired, limited or restricted Memory Goal Status ([id number]([id number]): At least 1 percent but less than 20 percent impaired, limited or restricted Memory Discharge Status [phone]([id number]): At least 1 percent but less than 20 percent impaired, limited or restricted         Tressie StalkerPat Adams, M.S., CCC-SLP 05/22/2017, 11:27 AM

## 2017-05-22 NOTE — H&P (Signed)
[facility]                                                                                    Patient Demographics  [name], is a 83 y.o. female  CSN: [id number]  MRN: [id number]  DOB - Nov 18, 1927  Admit Date - 05/21/2017  Outpatient Primary MD for the patient is Lovenia Kim, PA-C   With History of -  Past Medical History:  Diagnosis Date  . Arthritis   . Chronic kidney disease   . Depression   . Dysrhythmia 8/13   palpitations/ now resolved  . GERD (gastroesophageal reflux disease)   . History of blood transfusion 2012  . Hypertension   . Peripheral vascular disease (HCC)    varicose veins bilaterally   . Restless legs syndrome       Past Surgical History:  Procedure Laterality Date  . ABDOMINAL HYSTERECTOMY    . APPENDECTOMY    . EYE SURGERY Bilateral    cataract extraction with IOL  . JOINT REPLACEMENT Right    knee  . SEPTOPLASTY     repair deviated septum  . VARICOSE VEIN SURGERY Bilateral     in for   Chief Complaint  Patient presents with  . Numbness     HPI  [name]  is a 83 y.o. female, with past medical history significant for chronic kidney disease , osteoarthritis , arthritis of the spine  and atrial fibrillation on Eliquis presenting with 2 weeks history of right-sided numbness and weakness that the patient reports it got worse after his steroid spinal injection done at the orthopedics office. Patient has history of severe osteoarthritis and she had bilateral knee replacements and still have some hip pain. Patient right-sided weakness got worse and now has a right hand contracture. No loss of urine or stool reported    Review of Systems    In addition to the HPI above,  No Fever-chills, No Headache, No changes with Vision or hearing, No problems swallowing food or Liquids, No Chest pain, Cough or Shortness of Breath, No Abdominal pain, No Nausea or Vommitting, Bowel movements are regular, No Blood in stool or  Urine, No dysuria, No new skin rashes or bruises,  No new weakness, tingling, numbness in any extremity, No recent weight gain or loss, No polyuria, polydypsia or polyphagia, No significant Mental Stressors.  A full 10 point Review of Systems was done, except as stated above, all other Review of Systems were negative.   Social History Social History   Tobacco Use  . Smoking status: Never Smoker  . Smokeless tobacco: Never Used  Substance Use Topics  . Alcohol use: No     Family History Family History  Problem Relation Age of Onset  . Heart attack Father   . Stroke Father   . Hypertension Father   . COPD Mother   . Lung cancer Sister   . Uterine cancer Sister   . Colon polyps Daughter   . Colon cancer Unknown      Prior to Admission medications   Medication Sig Start Date End Date Taking? Authorizing Provider  albuterol (VENTOLIN HFA) 108 (90 Base) MCG/ACT  inhaler Inhale 2 puffs daily as needed into the lungs for wheezing or shortness of breath.    Yes [provider]  ALPRAZolam (XANAX) 0.25 MG tablet Take 0.25 mg at bedtime as needed by mouth for anxiety or sleep.  09/21/16  Yes [provider]  amiodarone (PACERONE) 200 MG tablet Take 200 mg at bedtime by mouth.    Yes [provider]  amLODipine (NORVASC) 5 MG tablet Take 2.5 mg daily after breakfast by mouth.    Yes [provider]  bisacodyl (DULCOLAX) 10 MG suppository Place 10 mg daily rectally.   Yes [provider]  cetirizine (ZYRTEC) 10 MG tablet Take 10 mg by mouth daily.   Yes [provider]  conjugated estrogens (PREMARIN) vaginal cream Place 1 application at bedtime as needed vaginally.   Yes [provider]  Cyanocobalamin (VITAMIN B-12 IJ) Inject 1 each every 30 (thirty) days as directed.   Yes [provider]  estradiol (ESTRACE) 0.5 MG tablet Take 0.5 mg daily by mouth.   Yes [provider]  fluticasone (FLONASE) 50  MCG/ACT nasal spray Place 1 spray daily as needed into both nostrils for allergies.    Yes [provider]  metoprolol succinate (TOPROL-XL) 100 MG 24 hr tablet Take 100 mg 2 (two) times daily by mouth.  10/21/16  Yes [provider]  pantoprazole (PROTONIX) 40 MG tablet Take 40 mg by mouth daily.   Yes [provider]  rOPINIRole (REQUIP) 0.5 MG tablet Take 1 mg by mouth at bedtime.   Yes [provider]  XARELTO 15 MG TABS tablet Take 15 mg daily by mouth.  10/26/16  Yes [provider]  acebutolol (SECTRAL) 400 MG capsule Take 400 mg by mouth daily after breakfast.     [provider]  acetaminophen (TYLENOL) 500 MG tablet Take 500 mg by mouth every 6 (six) hours as needed for pain.    [provider]  aspirin EC 81 MG EC tablet Take 1 tablet (81 mg total) by mouth daily. 05/27/13   Drema Dallas, MD  estradiol (ESTRACE) 1 MG tablet Take 1 mg by mouth daily.    [provider]  gabapentin (NEURONTIN) 300 MG capsule Take 600 mg by mouth daily.    [provider]  gabapentin (NEURONTIN) 300 MG capsule Take 1 capsule (300 mg total) by mouth 2 (two) times daily. 01/17/16   Asencion Islam, DPM  Omeprazole-Sodium Bicarbonate (ZEGERID) 20-1100 MG CAPS Take 1 capsule by mouth at bedtime.     [provider]    Allergies  Allergen Reactions  . Hctz [Hydrochlorothiazide]     Dizziness   . Lasix [Furosemide]     fatigue   . Penicillins Hives and Itching  . Tramadol-Acetaminophen     GI upset     Physical Exam  Vitals  Blood pressure 97/60, pulse (!) 51, temperature 98 F (36.7 C), temperature source Oral, resp. rate 17, SpO2 94 %.   1. General extremely pleasant female, well-developed and well-nourished  2. Normal affect and insight, Not Suicidal or Homicidal, Awake Alert, Oriented X 3.  3. Neuro: Mild right sided weakness compared to left, would place 3+ over 5 on the right and 4+ on left. Scars  of old surgery well-healed and knees and Babinski's were positive bilaterally.  4. Ears and Eyes appear Normal, Conjunctivae clear, PERRLA. Moist Oral Mucosa.  5. Supple Neck, No JVD, No cervical lymphadenopathy appriciated, No Carotid Bruits.  6. Symmetrical  Chest wall movement, Good air movement bilaterally, CTAB.  7. RRR, No Gallops, Rubs or Murmurs, No Parasternal Heave.  8. Positive Bowel Sounds, Abdomen Soft, Non tender, No organomegaly appriciated,No rebound -guarding or rigidity.  9.  No Cyanosis, Normal Skin Turgor, No Skin Rash or Bruise.  10. Good muscle tone,  joints appear normal , no effusions, Normal ROM.    Data Review  CBC Recent Labs  Lab 05/21/17 1055  WBC 4.1  HGB 14.5  HCT 42.2  PLT 198  MCV 90.9  MCH 31.3  MCHC 34.4  RDW 15.3   ------------------------------------------------------------------------------------------------------------------  Chemistries  Recent Labs  Lab 05/21/17 1055  NA 133*  K 4.1  CL 101  CO2 24  GLUCOSE 111*  BUN 14  CREATININE 1.09*  CALCIUM 9.0   ------------------------------------------------------------------------------------------------------------------ CrCl cannot be calculated (Unknown ideal weight.). ------------------------------------------------------------------------------------------------------------------ No results for input(s): TSH, T4TOTAL, T3FREE, THYROIDAB in the last 72 hours.  Invalid input(s): FREET3   Coagulation profile No results for input(s): INR, PROTIME in the last 168 hours. ------------------------------------------------------------------------------------------------------------------- No results for input(s): DDIMER in the last 72 hours. -------------------------------------------------------------------------------------------------------------------  Cardiac Enzymes No results for input(s): CKMB, TROPONINI, MYOGLOBIN in the last 168 hours.  Invalid input(s):  CK ------------------------------------------------------------------------------------------------------------------ Invalid input(s): POCBNP   ---------------------------------------------------------------------------------------------------------------  Urinalysis    Component Value Date/Time   COLORURINE STRAW (A) 05/21/2017 1354   APPEARANCEUR CLEAR 05/21/2017 1354   LABSPEC 1.008 05/21/2017 1354   PHURINE 8.0 05/21/2017 1354   GLUCOSEU NEGATIVE 05/21/2017 1354   HGBUR NEGATIVE 05/21/2017 1354   BILIRUBINUR NEGATIVE 05/21/2017 1354   KETONESUR NEGATIVE 05/21/2017 1354   PROTEINUR NEGATIVE 05/21/2017 1354   UROBILINOGEN 0.2 10/25/2012 1303   NITRITE NEGATIVE 05/21/2017 1354   LEUKOCYTESUR LARGE (A) 05/21/2017 1354    ----------------------------------------------------------------------------------------------------------------   Imaging results:   Mr Brain Wo Contrast  Result Date: 05/21/2017 CLINICAL DATA:  Initial evaluation for acute right upper and lower extremity weakness. EXAM: MRI HEAD WITHOUT CONTRAST MRI CERVICAL, THORACIC AND LUMBAR SPINE WITHOUT CONTRAST TECHNIQUE: Multiplanar and multiecho pulse sequences of the cervical spine, to include the craniocervical junction and cervicothoracic junction, and thoracic and lumbar spine, were obtained without intravenous contrast. COMPARISON:  Prior radiograph from 09/16/2015. FINDINGS: MRI HEAD FINDINGS Generalized age related cerebral atrophy. Mild chronic microvascular ischemic disease. No evidence for acute or subacute ischemic infarct. Gray-white matter differentiation maintained. No encephalomalacia to suggest chronic infarction. No foci of susceptibility artifact to suggest acute or chronic intracranial hemorrhage. No mass lesion, midline shift or mass effect. No hydrocephalus. No extra-axial fluid collection. Major dural sinuses are grossly patent. Pituitary gland within normal limits. Midline structures intact and  normal. Major intracranial vascular flow voids are maintained. Right vertebral artery dominant with hypoplastic left vertebral artery. Degenerative thickening of the tectorial membrane with secondary narrowing at the craniocervical junction. Bone marrow signal intensity within normal limits. No scalp soft tissue abnormality. Globes and oval soft tissues within normal limits. Patient status post lens extraction bilaterally. Mild scattered mucosal thickening within the ethmoidal air cells. Chronic right sphenoid sinusitis noted. Paranasal sinuses otherwise clear. Small bilateral mastoid effusions, slightly larger on the left, of doubtful significance. Inner ear structures normal. MRI CERVICAL SPINE FINDINGS Alignment: Straightening of the normal cervical lordosis. Trace anterolisthesis of C3 on C4. 3 mm anterolisthesis of C7 on T1. Vertebrae: Vertebral body heights maintained without evidence for acute or chronic fracture. Prominent reactive endplate changes present about the C5-6 and C6-7 interspaces. C4 and C5 vertebral bodies partially ankylosed due to chronic degenerative height  loss at the C4-5 disc. Bone marrow signal intensity within normal limits. No discrete or worrisome osseous lesions. Reactive edema about the left C4-5 facet as well as the right C2-3 through C5-6 facets due to facet arthritis, most prevalent at C5-6 on the right (series 15, image 2). Cord: Possible tiny focus of T2 signal abnormality within the cervical spinal cord at the level of C6, which may reflect a small focus of myelomalacia (series 12, image 9). Signal intensity within the cervical spinal cord otherwise within normal limits. Cord is somewhat atrophic in appearance due to severe multifocal stenosis. Posterior Fossa, vertebral arteries, paraspinal tissues: Degenerative changes seen about the C1-2 articulation with thickening of the tectorial membrane. Superimposed 4 mm synovial cyst noted (series 12, image 9). Secondary mild  narrowing at the craniocervical junction. Paraspinous soft tissues within normal limits. Normal intravascular flow voids within the vertebral arteries grossly maintained. Disc levels: C2-C3: Intervertebral disc space narrowing with disc desiccation. Bilateral uncovertebral hypertrophy with facet degeneration, greater on the left. Mild spinal stenosis. Moderate left with mild right C3 foraminal narrowing. C3-C4: Diffuse disc bulge with intervertebral disc space narrowing. Facet ligamentum flavum hypertrophy. Severe spinal stenosis with compression of the spinal cord. Thecal sac measures 4 mm in AP diameter. Severe bilateral C4 foraminal stenosis. C4-C5: Chronic disc space height loss with partial ankylosis of the C4 and C5 vertebral bodies. Broad central disc osteophyte indents the ventral thecal sac and impinges upon the ventral spinal cord. Moderate spinal stenosis. Severe bilateral C5 foraminal stenosis. C5-C6: Chronic diffuse degenerative disc osteophyte with intervertebral disc space narrowing. Facet ligamentum flavum hypertrophy. Severe canal stenosis with compression of the spinal cord. Thecal sac approximately 4 mm in AP diameter. Severe C6 foraminal stenosis. C6-C7: Chronic diffuse degenerative disc osteophyte and facet hypertrophy with resultant severe spinal stenosis. Impression of the cervical spinal cord, similar to additional levels. Severe bilateral C7 foraminal stenosis. C7-T1: Anterolisthesis. Diffuse disc bulge. Advanced facet arthropathy bilaterally. Mild spinal stenosis. Moderate bilateral C8 foraminal narrowing. MRI THORACIC SPINE FINDINGS Alignment: Mild dextroscoliosis. Vertebral bodies otherwise normally aligned with preservation of the normal thoracic kyphosis. Trace anterolisthesis of T1 on T2 noted. Vertebrae: Vertebral body heights maintained without evidence for acute or chronic fracture. Prominent reactive endplate changes noted about the C6-7 interspace. Degenerative disc space height  loss with partial ankylosis present at T7-8 anteriorly. Bone marrow signal intensity within normal limits. No worrisome osseous lesions. Cord: Signal intensity within the thoracic spinal cord is normal. The Paraspinal and other soft tissues: Paraspinous soft tissues within normal limits. Partially visualized lungs are grossly clear. Atherosclerotic change noted within the aorta. Visualized visceral structures within normal limits. Disc levels: Multilevel noncompressive disc bulging seen within the thoracic spine at nearly all levels. Note made of a right paracentral disc protrusion at T5-6 flattening the right hemi cord without cord signal changes (series 24, image 14). Left paracentral disc protrusion at T9-10 indents the left ventral thecal sac without stenosis or cord deformity (series 24, image 26). Multilevel facet arthropathy present within the lower thoracic spine, most notable at T11-12. No significant spinal stenosis. Mild to moderate bilateral foraminal narrowing present at T2-3 and T10-11. No other significant foraminal encroachment. MRI LUMBAR SPINE FINDINGS Segmentation:  Normal. Alignment: Straightening with reversal of the normal lumbar lordosis. Chronic mild retrolisthesis of L1 on L2 through L5-S1. Vertebrae: Vertebral body heights maintained without evidence for acute or chronic fracture. Prominent reactive endplate changes present about the L1-2 and L4-5 interspaces. Bone marrow signal intensity normal. No discrete  or worrisome osseous lesions. Conus medullaris: Mildly low lying extending to the L2-3 level. Distal spinal cord and conus are normal in appearance. Paraspinal and other soft tissues: Paraspinous soft tissues demonstrate no acute abnormality. Chronic fatty atrophy noted within the lower paraspinous musculature. Visualized visceral structures within normal limits. Disc levels: L1-2: Diffuse disc bulge with disc desiccation and intervertebral disc space narrowing. Disc bulging eccentric  to the right with associated right far lateral reactive endplate changes. Mild right lateral recess narrowing without significant canal stenosis. Foramina are patent. L2-3: Chronic intervertebral disc space narrowing with diffuse disc bulge and reactive endplate changes. Moderate facet hypertrophy. Resultant mild canal with moderate bilateral lateral recess narrowing. Moderate bilateral L2 foraminal stenosis. L3-4: Diffuse disc bulge with intervertebral disc space narrowing. Moderate facet arthrosis with ligamentum flavum hypertrophy. Resultant moderate to severe canal and bilateral subarticular stenosis. Thecal sac measures 8 mm in AP diameter. Moderate bilateral L3 foraminal stenosis, slightly worse on the right. L4-5: Chronic intervertebral disc space narrowing with diffuse disc bulge. Moderate to advanced facet arthrosis with ligamentum flavum hypertrophy. Severe canal and bilateral subarticular stenosis. Thecal sac measures approximately 4-5 mm in AP diameter. Moderate bilateral L4 foraminal stenosis. L5-S1: Diffuse disc bulge with chronic intervertebral disc space narrowing. Superimposed small central disc protrusion indents the ventral thecal sac. Mild facet hypertrophy. No significant canal stenosis. Mild right L5 foraminal narrowing. IMPRESSION: MRI HEAD SPINE IMPRESSION: 1. No acute intracranial process identified. 2. Age-related cerebral atrophy with mild chronic small vessel ischemic disease. 3. Chronic right sphenoid sinusitis. MRI CERVICAL SPINE IMPRESSION: 1. 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 2. Question faint focus of myelomalacia within the cervical spinal cord at the level of C6 as above. 3. Multifactorial degenerative changes with resultant severe multilevel foraminal narrowing as above, severe bilaterally at C3-4 through C6-7. 4. Right greater than left facet arthritis with associated reactive edema as above. Finding  could serve as a source for neck pain. MRI THORACIC SPINE IMPRESSION: 1. Multilevel noncompressive disc bulging throughout the thoracic spine without significant canal stenosis. Superimposed small disc protrusions at T5-6 and T9-10 as above. 2. Multilevel facet arthropathy within the lower thoracic spine, most prevalent at T11-12. 3. Prominent discogenic reactive endplate changes at C6-7. MRI LUMBAR SPINE IMPRESSION: 1. Multilevel degenerative disc disease and facet arthrosis with resulting canal stenosis at L2-3 through L4-5. Changes most pronounced at the L4-5 level were there is severe spinal stenosis. 2. Multifactorial degenerative changes with resultant moderate multilevel foraminal narrowing at L2-3 through L4-5 bilaterally. Electronically Signed   By: Rise MuBenjamin  McClintock M.D.   On: 05/21/2017 22:44   Mr Cervical Spine Wo Contrast  Result Date: 05/21/2017 CLINICAL DATA:  Initial evaluation for acute right upper and lower extremity weakness. EXAM: MRI HEAD WITHOUT CONTRAST MRI CERVICAL, THORACIC AND LUMBAR SPINE WITHOUT CONTRAST TECHNIQUE: Multiplanar and multiecho pulse sequences of the cervical spine, to include the craniocervical junction and cervicothoracic junction, and thoracic and lumbar spine, were obtained without intravenous contrast. COMPARISON:  Prior radiograph from 09/16/2015. FINDINGS: MRI HEAD FINDINGS Generalized age related cerebral atrophy. Mild chronic microvascular ischemic disease. No evidence for acute or subacute ischemic infarct. Gray-white matter differentiation maintained. No encephalomalacia to suggest chronic infarction. No foci of susceptibility artifact to suggest acute or chronic intracranial hemorrhage. No mass lesion, midline shift or mass effect. No hydrocephalus. No extra-axial fluid collection. Major dural sinuses are grossly patent. Pituitary gland within normal limits. Midline structures intact and normal. Major intracranial  vascular flow voids are maintained. Right  vertebral artery dominant with hypoplastic left vertebral artery. Degenerative thickening of the tectorial membrane with secondary narrowing at the craniocervical junction. Bone marrow signal intensity within normal limits. No scalp soft tissue abnormality. Globes and oval soft tissues within normal limits. Patient status post lens extraction bilaterally. Mild scattered mucosal thickening within the ethmoidal air cells. Chronic right sphenoid sinusitis noted. Paranasal sinuses otherwise clear. Small bilateral mastoid effusions, slightly larger on the left, of doubtful significance. Inner ear structures normal. MRI CERVICAL SPINE FINDINGS Alignment: Straightening of the normal cervical lordosis. Trace anterolisthesis of C3 on C4. 3 mm anterolisthesis of C7 on T1. Vertebrae: Vertebral body heights maintained without evidence for acute or chronic fracture. Prominent reactive endplate changes present about the C5-6 and C6-7 interspaces. C4 and C5 vertebral bodies partially ankylosed due to chronic degenerative height loss at the C4-5 disc. Bone marrow signal intensity within normal limits. No discrete or worrisome osseous lesions. Reactive edema about the left C4-5 facet as well as the right C2-3 through C5-6 facets due to facet arthritis, most prevalent at C5-6 on the right (series 15, image 2). Cord: Possible tiny focus of T2 signal abnormality within the cervical spinal cord at the level of C6, which may reflect a small focus of myelomalacia (series 12, image 9). Signal intensity within the cervical spinal cord otherwise within normal limits. Cord is somewhat atrophic in appearance due to severe multifocal stenosis. Posterior Fossa, vertebral arteries, paraspinal tissues: Degenerative changes seen about the C1-2 articulation with thickening of the tectorial membrane. Superimposed 4 mm synovial cyst noted (series 12, image 9). Secondary mild narrowing at the craniocervical junction. Paraspinous soft tissues within  normal limits. Normal intravascular flow voids within the vertebral arteries grossly maintained. Disc levels: C2-C3: Intervertebral disc space narrowing with disc desiccation. Bilateral uncovertebral hypertrophy with facet degeneration, greater on the left. Mild spinal stenosis. Moderate left with mild right C3 foraminal narrowing. C3-C4: Diffuse disc bulge with intervertebral disc space narrowing. Facet ligamentum flavum hypertrophy. Severe spinal stenosis with compression of the spinal cord. Thecal sac measures 4 mm in AP diameter. Severe bilateral C4 foraminal stenosis. C4-C5: Chronic disc space height loss with partial ankylosis of the C4 and C5 vertebral bodies. Broad central disc osteophyte indents the ventral thecal sac and impinges upon the ventral spinal cord. Moderate spinal stenosis. Severe bilateral C5 foraminal stenosis. C5-C6: Chronic diffuse degenerative disc osteophyte with intervertebral disc space narrowing. Facet ligamentum flavum hypertrophy. Severe canal stenosis with compression of the spinal cord. Thecal sac approximately 4 mm in AP diameter. Severe C6 foraminal stenosis. C6-C7: Chronic diffuse degenerative disc osteophyte and facet hypertrophy with resultant severe spinal stenosis. Impression of the cervical spinal cord, similar to additional levels. Severe bilateral C7 foraminal stenosis. C7-T1: Anterolisthesis. Diffuse disc bulge. Advanced facet arthropathy bilaterally. Mild spinal stenosis. Moderate bilateral C8 foraminal narrowing. MRI THORACIC SPINE FINDINGS Alignment: Mild dextroscoliosis. Vertebral bodies otherwise normally aligned with preservation of the normal thoracic kyphosis. Trace anterolisthesis of T1 on T2 noted. Vertebrae: Vertebral body heights maintained without evidence for acute or chronic fracture. Prominent reactive endplate changes noted about the C6-7 interspace. Degenerative disc space height loss with partial ankylosis present at T7-8 anteriorly. Bone marrow signal  intensity within normal limits. No worrisome osseous lesions. Cord: Signal intensity within the thoracic spinal cord is normal. The Paraspinal and other soft tissues: Paraspinous soft tissues within normal limits. Partially visualized lungs are grossly clear. Atherosclerotic change noted within the aorta. Visualized visceral structures within normal limits.  Disc levels: Multilevel noncompressive disc bulging seen within the thoracic spine at nearly all levels. Note made of a right paracentral disc protrusion at T5-6 flattening the right hemi cord without cord signal changes (series 24, image 14). Left paracentral disc protrusion at T9-10 indents the left ventral thecal sac without stenosis or cord deformity (series 24, image 26). Multilevel facet arthropathy present within the lower thoracic spine, most notable at T11-12. No significant spinal stenosis. Mild to moderate bilateral foraminal narrowing present at T2-3 and T10-11. No other significant foraminal encroachment. MRI LUMBAR SPINE FINDINGS Segmentation:  Normal. Alignment: Straightening with reversal of the normal lumbar lordosis. Chronic mild retrolisthesis of L1 on L2 through L5-S1. Vertebrae: Vertebral body heights maintained without evidence for acute or chronic fracture. Prominent reactive endplate changes present about the L1-2 and L4-5 interspaces. Bone marrow signal intensity normal. No discrete or worrisome osseous lesions. Conus medullaris: Mildly low lying extending to the L2-3 level. Distal spinal cord and conus are normal in appearance. Paraspinal and other soft tissues: Paraspinous soft tissues demonstrate no acute abnormality. Chronic fatty atrophy noted within the lower paraspinous musculature. Visualized visceral structures within normal limits. Disc levels: L1-2: Diffuse disc bulge with disc desiccation and intervertebral disc space narrowing. Disc bulging eccentric to the right with associated right far lateral reactive endplate changes.  Mild right lateral recess narrowing without significant canal stenosis. Foramina are patent. L2-3: Chronic intervertebral disc space narrowing with diffuse disc bulge and reactive endplate changes. Moderate facet hypertrophy. Resultant mild canal with moderate bilateral lateral recess narrowing. Moderate bilateral L2 foraminal stenosis. L3-4: Diffuse disc bulge with intervertebral disc space narrowing. Moderate facet arthrosis with ligamentum flavum hypertrophy. Resultant moderate to severe canal and bilateral subarticular stenosis. Thecal sac measures 8 mm in AP diameter. Moderate bilateral L3 foraminal stenosis, slightly worse on the right. L4-5: Chronic intervertebral disc space narrowing with diffuse disc bulge. Moderate to advanced facet arthrosis with ligamentum flavum hypertrophy. Severe canal and bilateral subarticular stenosis. Thecal sac measures approximately 4-5 mm in AP diameter. Moderate bilateral L4 foraminal stenosis. L5-S1: Diffuse disc bulge with chronic intervertebral disc space narrowing. Superimposed small central disc protrusion indents the ventral thecal sac. Mild facet hypertrophy. No significant canal stenosis. Mild right L5 foraminal narrowing. IMPRESSION: MRI HEAD SPINE IMPRESSION: 1. No acute intracranial process identified. 2. Age-related cerebral atrophy with mild chronic small vessel ischemic disease. 3. Chronic right sphenoid sinusitis. MRI CERVICAL SPINE IMPRESSION: 1. 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 2. Question faint focus of myelomalacia within the cervical spinal cord at the level of C6 as above. 3. Multifactorial degenerative changes with resultant severe multilevel foraminal narrowing as above, severe bilaterally at C3-4 through C6-7. 4. Right greater than left facet arthritis with associated reactive edema as above. Finding could serve as a source for neck pain. MRI THORACIC SPINE IMPRESSION: 1.  Multilevel noncompressive disc bulging throughout the thoracic spine without significant canal stenosis. Superimposed small disc protrusions at T5-6 and T9-10 as above. 2. Multilevel facet arthropathy within the lower thoracic spine, most prevalent at T11-12. 3. Prominent discogenic reactive endplate changes at C6-7. MRI LUMBAR SPINE IMPRESSION: 1. Multilevel degenerative disc disease and facet arthrosis with resulting canal stenosis at L2-3 through L4-5. Changes most pronounced at the L4-5 level were there is severe spinal stenosis. 2. Multifactorial degenerative changes with resultant moderate multilevel foraminal narrowing at L2-3 through L4-5 bilaterally. Electronically Signed   By: Rise Mu M.D.   On: 05/21/2017 22:44  Mr Thoracic Spine Wo Contrast  Result Date: 05/21/2017 CLINICAL DATA:  Initial evaluation for acute right upper and lower extremity weakness. EXAM: MRI HEAD WITHOUT CONTRAST MRI CERVICAL, THORACIC AND LUMBAR SPINE WITHOUT CONTRAST TECHNIQUE: Multiplanar and multiecho pulse sequences of the cervical spine, to include the craniocervical junction and cervicothoracic junction, and thoracic and lumbar spine, were obtained without intravenous contrast. COMPARISON:  Prior radiograph from 09/16/2015. FINDINGS: MRI HEAD FINDINGS Generalized age related cerebral atrophy. Mild chronic microvascular ischemic disease. No evidence for acute or subacute ischemic infarct. Gray-white matter differentiation maintained. No encephalomalacia to suggest chronic infarction. No foci of susceptibility artifact to suggest acute or chronic intracranial hemorrhage. No mass lesion, midline shift or mass effect. No hydrocephalus. No extra-axial fluid collection. Major dural sinuses are grossly patent. Pituitary gland within normal limits. Midline structures intact and normal. Major intracranial vascular flow voids are maintained. Right vertebral artery dominant with hypoplastic left vertebral artery.  Degenerative thickening of the tectorial membrane with secondary narrowing at the craniocervical junction. Bone marrow signal intensity within normal limits. No scalp soft tissue abnormality. Globes and oval soft tissues within normal limits. Patient status post lens extraction bilaterally. Mild scattered mucosal thickening within the ethmoidal air cells. Chronic right sphenoid sinusitis noted. Paranasal sinuses otherwise clear. Small bilateral mastoid effusions, slightly larger on the left, of doubtful significance. Inner ear structures normal. MRI CERVICAL SPINE FINDINGS Alignment: Straightening of the normal cervical lordosis. Trace anterolisthesis of C3 on C4. 3 mm anterolisthesis of C7 on T1. Vertebrae: Vertebral body heights maintained without evidence for acute or chronic fracture. Prominent reactive endplate changes present about the C5-6 and C6-7 interspaces. C4 and C5 vertebral bodies partially ankylosed due to chronic degenerative height loss at the C4-5 disc. Bone marrow signal intensity within normal limits. No discrete or worrisome osseous lesions. Reactive edema about the left C4-5 facet as well as the right C2-3 through C5-6 facets due to facet arthritis, most prevalent at C5-6 on the right (series 15, image 2). Cord: Possible tiny focus of T2 signal abnormality within the cervical spinal cord at the level of C6, which may reflect a small focus of myelomalacia (series 12, image 9). Signal intensity within the cervical spinal cord otherwise within normal limits. Cord is somewhat atrophic in appearance due to severe multifocal stenosis. Posterior Fossa, vertebral arteries, paraspinal tissues: Degenerative changes seen about the C1-2 articulation with thickening of the tectorial membrane. Superimposed 4 mm synovial cyst noted (series 12, image 9). Secondary mild narrowing at the craniocervical junction. Paraspinous soft tissues within normal limits. Normal intravascular flow voids within the vertebral  arteries grossly maintained. Disc levels: C2-C3: Intervertebral disc space narrowing with disc desiccation. Bilateral uncovertebral hypertrophy with facet degeneration, greater on the left. Mild spinal stenosis. Moderate left with mild right C3 foraminal narrowing. C3-C4: Diffuse disc bulge with intervertebral disc space narrowing. Facet ligamentum flavum hypertrophy. Severe spinal stenosis with compression of the spinal cord. Thecal sac measures 4 mm in AP diameter. Severe bilateral C4 foraminal stenosis. C4-C5: Chronic disc space height loss with partial ankylosis of the C4 and C5 vertebral bodies. Broad central disc osteophyte indents the ventral thecal sac and impinges upon the ventral spinal cord. Moderate spinal stenosis. Severe bilateral C5 foraminal stenosis. C5-C6: Chronic diffuse degenerative disc osteophyte with intervertebral disc space narrowing. Facet ligamentum flavum hypertrophy. Severe canal stenosis with compression of the spinal cord. Thecal sac approximately 4 mm in AP diameter. Severe C6 foraminal stenosis. C6-C7: Chronic diffuse degenerative disc osteophyte and facet hypertrophy with resultant severe  spinal stenosis. Impression of the cervical spinal cord, similar to additional levels. Severe bilateral C7 foraminal stenosis. C7-T1: Anterolisthesis. Diffuse disc bulge. Advanced facet arthropathy bilaterally. Mild spinal stenosis. Moderate bilateral C8 foraminal narrowing. MRI THORACIC SPINE FINDINGS Alignment: Mild dextroscoliosis. Vertebral bodies otherwise normally aligned with preservation of the normal thoracic kyphosis. Trace anterolisthesis of T1 on T2 noted. Vertebrae: Vertebral body heights maintained without evidence for acute or chronic fracture. Prominent reactive endplate changes noted about the C6-7 interspace. Degenerative disc space height loss with partial ankylosis present at T7-8 anteriorly. Bone marrow signal intensity within normal limits. No worrisome osseous lesions. Cord:  Signal intensity within the thoracic spinal cord is normal. The Paraspinal and other soft tissues: Paraspinous soft tissues within normal limits. Partially visualized lungs are grossly clear. Atherosclerotic change noted within the aorta. Visualized visceral structures within normal limits. Disc levels: Multilevel noncompressive disc bulging seen within the thoracic spine at nearly all levels. Note made of a right paracentral disc protrusion at T5-6 flattening the right hemi cord without cord signal changes (series 24, image 14). Left paracentral disc protrusion at T9-10 indents the left ventral thecal sac without stenosis or cord deformity (series 24, image 26). Multilevel facet arthropathy present within the lower thoracic spine, most notable at T11-12. No significant spinal stenosis. Mild to moderate bilateral foraminal narrowing present at T2-3 and T10-11. No other significant foraminal encroachment. MRI LUMBAR SPINE FINDINGS Segmentation:  Normal. Alignment: Straightening with reversal of the normal lumbar lordosis. Chronic mild retrolisthesis of L1 on L2 through L5-S1. Vertebrae: Vertebral body heights maintained without evidence for acute or chronic fracture. Prominent reactive endplate changes present about the L1-2 and L4-5 interspaces. Bone marrow signal intensity normal. No discrete or worrisome osseous lesions. Conus medullaris: Mildly low lying extending to the L2-3 level. Distal spinal cord and conus are normal in appearance. Paraspinal and other soft tissues: Paraspinous soft tissues demonstrate no acute abnormality. Chronic fatty atrophy noted within the lower paraspinous musculature. Visualized visceral structures within normal limits. Disc levels: L1-2: Diffuse disc bulge with disc desiccation and intervertebral disc space narrowing. Disc bulging eccentric to the right with associated right far lateral reactive endplate changes. Mild right lateral recess narrowing without significant canal  stenosis. Foramina are patent. L2-3: Chronic intervertebral disc space narrowing with diffuse disc bulge and reactive endplate changes. Moderate facet hypertrophy. Resultant mild canal with moderate bilateral lateral recess narrowing. Moderate bilateral L2 foraminal stenosis. L3-4: Diffuse disc bulge with intervertebral disc space narrowing. Moderate facet arthrosis with ligamentum flavum hypertrophy. Resultant moderate to severe canal and bilateral subarticular stenosis. Thecal sac measures 8 mm in AP diameter. Moderate bilateral L3 foraminal stenosis, slightly worse on the right. L4-5: Chronic intervertebral disc space narrowing with diffuse disc bulge. Moderate to advanced facet arthrosis with ligamentum flavum hypertrophy. Severe canal and bilateral subarticular stenosis. Thecal sac measures approximately 4-5 mm in AP diameter. Moderate bilateral L4 foraminal stenosis. L5-S1: Diffuse disc bulge with chronic intervertebral disc space narrowing. Superimposed small central disc protrusion indents the ventral thecal sac. Mild facet hypertrophy. No significant canal stenosis. Mild right L5 foraminal narrowing. IMPRESSION: MRI HEAD SPINE IMPRESSION: 1. No acute intracranial process identified. 2. Age-related cerebral atrophy with mild chronic small vessel ischemic disease. 3. Chronic right sphenoid sinusitis. MRI CERVICAL SPINE IMPRESSION: 1. 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 2. Question faint focus of myelomalacia within the cervical spinal cord at the level of C6 as above. 3. Multifactorial degenerative changes with resultant  severe multilevel foraminal narrowing as above, severe bilaterally at C3-4 through C6-7. 4. Right greater than left facet arthritis with associated reactive edema as above. Finding could serve as a source for neck pain. MRI THORACIC SPINE IMPRESSION: 1. Multilevel noncompressive disc bulging throughout the thoracic  spine without significant canal stenosis. Superimposed small disc protrusions at T5-6 and T9-10 as above. 2. Multilevel facet arthropathy within the lower thoracic spine, most prevalent at T11-12. 3. Prominent discogenic reactive endplate changes at C6-7. MRI LUMBAR SPINE IMPRESSION: 1. Multilevel degenerative disc disease and facet arthrosis with resulting canal stenosis at L2-3 through L4-5. Changes most pronounced at the L4-5 level were there is severe spinal stenosis. 2. Multifactorial degenerative changes with resultant moderate multilevel foraminal narrowing at L2-3 through L4-5 bilaterally. Electronically Signed   By: Rise Mu M.D.   On: 05/21/2017 22:44   Mr Lumbar Spine Wo Contrast  Result Date: 05/21/2017 CLINICAL DATA:  Initial evaluation for acute right upper and lower extremity weakness. EXAM: MRI HEAD WITHOUT CONTRAST MRI CERVICAL, THORACIC AND LUMBAR SPINE WITHOUT CONTRAST TECHNIQUE: Multiplanar and multiecho pulse sequences of the cervical spine, to include the craniocervical junction and cervicothoracic junction, and thoracic and lumbar spine, were obtained without intravenous contrast. COMPARISON:  Prior radiograph from 09/16/2015. FINDINGS: MRI HEAD FINDINGS Generalized age related cerebral atrophy. Mild chronic microvascular ischemic disease. No evidence for acute or subacute ischemic infarct. Gray-white matter differentiation maintained. No encephalomalacia to suggest chronic infarction. No foci of susceptibility artifact to suggest acute or chronic intracranial hemorrhage. No mass lesion, midline shift or mass effect. No hydrocephalus. No extra-axial fluid collection. Major dural sinuses are grossly patent. Pituitary gland within normal limits. Midline structures intact and normal. Major intracranial vascular flow voids are maintained. Right vertebral artery dominant with hypoplastic left vertebral artery. Degenerative thickening of the tectorial membrane with secondary  narrowing at the craniocervical junction. Bone marrow signal intensity within normal limits. No scalp soft tissue abnormality. Globes and oval soft tissues within normal limits. Patient status post lens extraction bilaterally. Mild scattered mucosal thickening within the ethmoidal air cells. Chronic right sphenoid sinusitis noted. Paranasal sinuses otherwise clear. Small bilateral mastoid effusions, slightly larger on the left, of doubtful significance. Inner ear structures normal. MRI CERVICAL SPINE FINDINGS Alignment: Straightening of the normal cervical lordosis. Trace anterolisthesis of C3 on C4. 3 mm anterolisthesis of C7 on T1. Vertebrae: Vertebral body heights maintained without evidence for acute or chronic fracture. Prominent reactive endplate changes present about the C5-6 and C6-7 interspaces. C4 and C5 vertebral bodies partially ankylosed due to chronic degenerative height loss at the C4-5 disc. Bone marrow signal intensity within normal limits. No discrete or worrisome osseous lesions. Reactive edema about the left C4-5 facet as well as the right C2-3 through C5-6 facets due to facet arthritis, most prevalent at C5-6 on the right (series 15, image 2). Cord: Possible tiny focus of T2 signal abnormality within the cervical spinal cord at the level of C6, which may reflect a small focus of myelomalacia (series 12, image 9). Signal intensity within the cervical spinal cord otherwise within normal limits. Cord is somewhat atrophic in appearance due to severe multifocal stenosis. Posterior Fossa, vertebral arteries, paraspinal tissues: Degenerative changes seen about the C1-2 articulation with thickening of the tectorial membrane. Superimposed 4 mm synovial cyst noted (series 12, image 9). Secondary mild narrowing at the craniocervical junction. Paraspinous soft tissues within normal limits. Normal intravascular flow voids within the vertebral arteries grossly maintained. Disc levels: C2-C3: Intervertebral  disc space narrowing  with disc desiccation. Bilateral uncovertebral hypertrophy with facet degeneration, greater on the left. Mild spinal stenosis. Moderate left with mild right C3 foraminal narrowing. C3-C4: Diffuse disc bulge with intervertebral disc space narrowing. Facet ligamentum flavum hypertrophy. Severe spinal stenosis with compression of the spinal cord. Thecal sac measures 4 mm in AP diameter. Severe bilateral C4 foraminal stenosis. C4-C5: Chronic disc space height loss with partial ankylosis of the C4 and C5 vertebral bodies. Broad central disc osteophyte indents the ventral thecal sac and impinges upon the ventral spinal cord. Moderate spinal stenosis. Severe bilateral C5 foraminal stenosis. C5-C6: Chronic diffuse degenerative disc osteophyte with intervertebral disc space narrowing. Facet ligamentum flavum hypertrophy. Severe canal stenosis with compression of the spinal cord. Thecal sac approximately 4 mm in AP diameter. Severe C6 foraminal stenosis. C6-C7: Chronic diffuse degenerative disc osteophyte and facet hypertrophy with resultant severe spinal stenosis. Impression of the cervical spinal cord, similar to additional levels. Severe bilateral C7 foraminal stenosis. C7-T1: Anterolisthesis. Diffuse disc bulge. Advanced facet arthropathy bilaterally. Mild spinal stenosis. Moderate bilateral C8 foraminal narrowing. MRI THORACIC SPINE FINDINGS Alignment: Mild dextroscoliosis. Vertebral bodies otherwise normally aligned with preservation of the normal thoracic kyphosis. Trace anterolisthesis of T1 on T2 noted. Vertebrae: Vertebral body heights maintained without evidence for acute or chronic fracture. Prominent reactive endplate changes noted about the C6-7 interspace. Degenerative disc space height loss with partial ankylosis present at T7-8 anteriorly. Bone marrow signal intensity within normal limits. No worrisome osseous lesions. Cord: Signal intensity within the thoracic spinal cord is normal. The  Paraspinal and other soft tissues: Paraspinous soft tissues within normal limits. Partially visualized lungs are grossly clear. Atherosclerotic change noted within the aorta. Visualized visceral structures within normal limits. Disc levels: Multilevel noncompressive disc bulging seen within the thoracic spine at nearly all levels. Note made of a right paracentral disc protrusion at T5-6 flattening the right hemi cord without cord signal changes (series 24, image 14). Left paracentral disc protrusion at T9-10 indents the left ventral thecal sac without stenosis or cord deformity (series 24, image 26). Multilevel facet arthropathy present within the lower thoracic spine, most notable at T11-12. No significant spinal stenosis. Mild to moderate bilateral foraminal narrowing present at T2-3 and T10-11. No other significant foraminal encroachment. MRI LUMBAR SPINE FINDINGS Segmentation:  Normal. Alignment: Straightening with reversal of the normal lumbar lordosis. Chronic mild retrolisthesis of L1 on L2 through L5-S1. Vertebrae: Vertebral body heights maintained without evidence for acute or chronic fracture. Prominent reactive endplate changes present about the L1-2 and L4-5 interspaces. Bone marrow signal intensity normal. No discrete or worrisome osseous lesions. Conus medullaris: Mildly low lying extending to the L2-3 level. Distal spinal cord and conus are normal in appearance. Paraspinal and other soft tissues: Paraspinous soft tissues demonstrate no acute abnormality. Chronic fatty atrophy noted within the lower paraspinous musculature. Visualized visceral structures within normal limits. Disc levels: L1-2: Diffuse disc bulge with disc desiccation and intervertebral disc space narrowing. Disc bulging eccentric to the right with associated right far lateral reactive endplate changes. Mild right lateral recess narrowing without significant canal stenosis. Foramina are patent. L2-3: Chronic intervertebral disc space  narrowing with diffuse disc bulge and reactive endplate changes. Moderate facet hypertrophy. Resultant mild canal with moderate bilateral lateral recess narrowing. Moderate bilateral L2 foraminal stenosis. L3-4: Diffuse disc bulge with intervertebral disc space narrowing. Moderate facet arthrosis with ligamentum flavum hypertrophy. Resultant moderate to severe canal and bilateral subarticular stenosis. Thecal sac measures 8 mm in AP diameter. Moderate bilateral L3 foraminal stenosis, slightly  worse on the right. L4-5: Chronic intervertebral disc space narrowing with diffuse disc bulge. Moderate to advanced facet arthrosis with ligamentum flavum hypertrophy. Severe canal and bilateral subarticular stenosis. Thecal sac measures approximately 4-5 mm in AP diameter. Moderate bilateral L4 foraminal stenosis. L5-S1: Diffuse disc bulge with chronic intervertebral disc space narrowing. Superimposed small central disc protrusion indents the ventral thecal sac. Mild facet hypertrophy. No significant canal stenosis. Mild right L5 foraminal narrowing. IMPRESSION: MRI HEAD SPINE IMPRESSION: 1. No acute intracranial process identified. 2. Age-related cerebral atrophy with mild chronic small vessel ischemic disease. 3. Chronic right sphenoid sinusitis. MRI CERVICAL SPINE IMPRESSION: 1. 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 2. Question faint focus of myelomalacia within the cervical spinal cord at the level of C6 as above. 3. Multifactorial degenerative changes with resultant severe multilevel foraminal narrowing as above, severe bilaterally at C3-4 through C6-7. 4. Right greater than left facet arthritis with associated reactive edema as above. Finding could serve as a source for neck pain. MRI THORACIC SPINE IMPRESSION: 1. Multilevel noncompressive disc bulging throughout the thoracic spine without significant canal stenosis. Superimposed small disc  protrusions at T5-6 and T9-10 as above. 2. Multilevel facet arthropathy within the lower thoracic spine, most prevalent at T11-12. 3. Prominent discogenic reactive endplate changes at C6-7. MRI LUMBAR SPINE IMPRESSION: 1. Multilevel degenerative disc disease and facet arthrosis with resulting canal stenosis at L2-3 through L4-5. Changes most pronounced at the L4-5 level were there is severe spinal stenosis. 2. Multifactorial degenerative changes with resultant moderate multilevel foraminal narrowing at L2-3 through L4-5 bilaterally. Electronically Signed   By: Rise MuBenjamin  McClintock M.D.   On: 05/21/2017 22:44    Assessment & Plan  1. Right-sided paresis, chronic, spinal etiology. The distribution of the weakness is not explained by steroid injection site. Doubt as the cause  3. Multi-level disc impingement on the spine worse on the cervical site and lumbar  Plan Discussed with neurology who will evaluate the patient at the floor May need physical therapy evaluation Patient is not open to the idea of surgery at this time   DVT Prophylaxis liquids  AM Labs Ordered, also please review Full Orders  Family Communication: Admission, patients condition and plan of care including tests being ordered have been discussed with the patient and daughter who indicate understanding and agree with the plan and Code Status.  Code Status DO NOT RESUSCITATE  Disposition Plan: Undetermined  Time spent in minutes : 35 minutes  Condition GUARDED   @SIGNATURE @

## 2017-05-22 NOTE — Consult Note (Signed)
Reason for Consult:weakness Referring Physician: medicine  [name] is an 83 y.o. female.  HPI: 83 year old female admitted for progressive weakness. Patient reports a few week history of increasing right upper extremity weakness. She has lost the use of her right hand. She has some numbness and tingling both in her right and left distal upper extremities. She's lost some dexterity with her left hand but is not having the weakness she has on the right side. She has significant lumbar pain with some radiation into her lower extremities worse on the right side. She notes weakness of her entire right lower extremity. She is not aware of any definite left-sided weakness. She has no bowel or bladder dysfunction present.  Past Medical History:  Diagnosis Date  . Arthritis   . Chronic kidney disease   . Depression   . Dysrhythmia 8/13   palpitations/ now resolved  . GERD (gastroesophageal reflux disease)   . History of blood transfusion 2012  . Hypertension   . Peripheral vascular disease (HCC)    varicose veins bilaterally   . Restless legs syndrome     Past Surgical History:  Procedure Laterality Date  . ABDOMINAL HYSTERECTOMY    . APPENDECTOMY    . EYE SURGERY Bilateral    cataract extraction with IOL  . JOINT REPLACEMENT Right    knee  . SEPTOPLASTY     repair deviated septum  . VARICOSE VEIN SURGERY Bilateral     Family History  Problem Relation Age of Onset  . Heart attack Father   . Stroke Father   . Hypertension Father   . COPD Mother   . Lung cancer Sister   . Uterine cancer Sister   . Colon polyps Daughter   . Colon cancer Unknown     Social History:  reports that  has never smoked. she has never used smokeless tobacco. She reports that she does not drink alcohol or use drugs.  Allergies:  Allergies  Allergen Reactions  . Hctz [Hydrochlorothiazide]     Dizziness   . Lasix [Furosemide]     fatigue   . Penicillins Hives and Itching  .  Tramadol-Acetaminophen     GI upset     Medications: I have reviewed the patient's current medications.  Results for orders placed or performed during the hospital encounter of 05/21/17 (from the past 48 hour(s))  Basic metabolic panel     Status: Abnormal   Collection Time: 05/21/17 10:55 AM  Result Value Ref Range   Sodium 133 (L) 135 - 145 mmol/L   Potassium 4.1 3.5 - 5.1 mmol/L   Chloride 101 101 - 111 mmol/L   CO2 24 22 - 32 mmol/L   Glucose, Bld 111 (H) 65 - 99 mg/dL   BUN 14 6 - 20 mg/dL   Creatinine, Ser 1.09 (H) 0.44 - 1.00 mg/dL   Calcium 9.0 8.9 - 10.3 mg/dL   GFR calc non Af Amer 44 (L) >60 mL/min   GFR calc Af Amer 51 (L) >60 mL/min    Comment: (NOTE) The eGFR has been calculated using the CKD EPI equation. This calculation has not been validated in all clinical situations. eGFR's persistently <60 mL/min signify possible Chronic Kidney Disease.    Anion gap 8 5 - 15  CBC     Status: None   Collection Time: 05/21/17 10:55 AM  Result Value Ref Range   WBC 4.1 4.0 - 10.5 K/uL   RBC 4.64 3.87 - 5.11 MIL/uL   Hemoglobin  14.5 12.0 - 15.0 g/dL   HCT 42.2 36.0 - 46.0 %   MCV 90.9 78.0 - 100.0 fL   MCH 31.3 26.0 - 34.0 pg   MCHC 34.4 30.0 - 36.0 g/dL   RDW 15.3 11.5 - 15.5 %   Platelets 198 150 - 400 K/uL  Urinalysis, Routine w reflex microscopic     Status: Abnormal   Collection Time: 05/21/17  1:54 PM  Result Value Ref Range   Color, Urine STRAW (A) YELLOW   APPearance CLEAR CLEAR   Specific Gravity, Urine 1.008 1.005 - 1.030   pH 8.0 5.0 - 8.0   Glucose, UA NEGATIVE NEGATIVE mg/dL   Hgb urine dipstick NEGATIVE NEGATIVE   Bilirubin Urine NEGATIVE NEGATIVE   Ketones, ur NEGATIVE NEGATIVE mg/dL   Protein, ur NEGATIVE NEGATIVE mg/dL   Nitrite NEGATIVE NEGATIVE   Leukocytes, UA LARGE (A) NEGATIVE   RBC / HPF 0-5 0 - 5 RBC/hpf   WBC, UA 0-5 0 - 5 WBC/hpf   Bacteria, UA NONE SEEN NONE SEEN   Squamous Epithelial / LPF 0-5 (A) NONE SEEN  Hemoglobin A1c      Status: None   Collection Time: 05/22/17  6:20 AM  Result Value Ref Range   Hgb A1c MFr Bld 5.5 4.8 - 5.6 %    Comment: (NOTE) Pre diabetes:          5.7%-6.4% Diabetes:              >6.4% Glycemic control for   <7.0% adults with diabetes    Mean Plasma Glucose 111.15 mg/dL  Lipid panel     Status: None   Collection Time: 05/22/17  6:20 AM  Result Value Ref Range   Cholesterol 167 0 - 200 mg/dL   Triglycerides 73 <150 mg/dL   HDL 81 >40 mg/dL   Total CHOL/HDL Ratio 2.1 RATIO   VLDL 15 0 - 40 mg/dL   LDL Cholesterol 71 0 - 99 mg/dL    Comment:        Total Cholesterol/HDL:CHD Risk Coronary Heart Disease Risk Table                     Men   Women  1/2 Average Risk   3.4   3.3  Average Risk       5.0   4.4  2 X Average Risk   9.6   7.1  3 X Average Risk  23.4   11.0        Use the calculated Patient Ratio above and the CHD Risk Table to determine the patient's CHD Risk.        ATP III CLASSIFICATION (LDL):  <100     mg/dL   Optimal  100-129  mg/dL   Near or Above                    Optimal  130-159  mg/dL   Borderline  160-189  mg/dL   High  >190     mg/dL   Very High   CBC     Status: None   Collection Time: 05/22/17  6:20 AM  Result Value Ref Range   WBC 6.0 4.0 - 10.5 K/uL   RBC 4.56 3.87 - 5.11 MIL/uL   Hemoglobin 14.1 12.0 - 15.0 g/dL   HCT 41.6 36.0 - 46.0 %   MCV 91.2 78.0 - 100.0 fL   MCH 30.9 26.0 - 34.0 pg   MCHC 33.9 30.0 -  36.0 g/dL   RDW 15.4 11.5 - 15.5 %   Platelets 209 150 - 400 K/uL  Creatinine, serum     Status: Abnormal   Collection Time: 05/22/17  6:20 AM  Result Value Ref Range   Creatinine, Ser 1.24 (H) 0.44 - 1.00 mg/dL   GFR calc non Af Amer 38 (L) >60 mL/min   GFR calc Af Amer 44 (L) >60 mL/min    Comment: (NOTE) The eGFR has been calculated using the CKD EPI equation. This calculation has not been validated in all clinical situations. eGFR's persistently <60 mL/min signify possible Chronic Kidney Disease.     Mr Brain Wo  Contrast  Result Date: 05/21/2017 CLINICAL DATA:  Initial evaluation for acute right upper and lower extremity weakness. EXAM: MRI HEAD WITHOUT CONTRAST MRI CERVICAL, THORACIC AND LUMBAR SPINE WITHOUT CONTRAST TECHNIQUE: Multiplanar and multiecho pulse sequences of the cervical spine, to include the craniocervical junction and cervicothoracic junction, and thoracic and lumbar spine, were obtained without intravenous contrast. COMPARISON:  Prior radiograph from 09/16/2015. FINDINGS: MRI HEAD FINDINGS Generalized age related cerebral atrophy. Mild chronic microvascular ischemic disease. No evidence for acute or subacute ischemic infarct. Gray-white matter differentiation maintained. No encephalomalacia to suggest chronic infarction. No foci of susceptibility artifact to suggest acute or chronic intracranial hemorrhage. No mass lesion, midline shift or mass effect. No hydrocephalus. No extra-axial fluid collection. Major dural sinuses are grossly patent. Pituitary gland within normal limits. Midline structures intact and normal. Major intracranial vascular flow voids are maintained. Right vertebral artery dominant with hypoplastic left vertebral artery. Degenerative thickening of the tectorial membrane with secondary narrowing at the craniocervical junction. Bone marrow signal intensity within normal limits. No scalp soft tissue abnormality. Globes and oval soft tissues within normal limits. Patient status post lens extraction bilaterally. Mild scattered mucosal thickening within the ethmoidal air cells. Chronic right sphenoid sinusitis noted. Paranasal sinuses otherwise clear. Small bilateral mastoid effusions, slightly larger on the left, of doubtful significance. Inner ear structures normal. MRI CERVICAL SPINE FINDINGS Alignment: Straightening of the normal cervical lordosis. Trace anterolisthesis of C3 on C4. 3 mm anterolisthesis of C7 on T1. Vertebrae: Vertebral body heights maintained without evidence for acute  or chronic fracture. Prominent reactive endplate changes present about the C5-6 and C6-7 interspaces. C4 and C5 vertebral bodies partially ankylosed due to chronic degenerative height loss at the C4-5 disc. Bone marrow signal intensity within normal limits. No discrete or worrisome osseous lesions. Reactive edema about the left C4-5 facet as well as the right C2-3 through C5-6 facets due to facet arthritis, most prevalent at C5-6 on the right (series 15, image 2). Cord: Possible tiny focus of T2 signal abnormality within the cervical spinal cord at the level of C6, which may reflect a small focus of myelomalacia (series 12, image 9). Signal intensity within the cervical spinal cord otherwise within normal limits. Cord is somewhat atrophic in appearance due to severe multifocal stenosis. Posterior Fossa, vertebral arteries, paraspinal tissues: Degenerative changes seen about the C1-2 articulation with thickening of the tectorial membrane. Superimposed 4 mm synovial cyst noted (series 12, image 9). Secondary mild narrowing at the craniocervical junction. Paraspinous soft tissues within normal limits. Normal intravascular flow voids within the vertebral arteries grossly maintained. Disc levels: C2-C3: Intervertebral disc space narrowing with disc desiccation. Bilateral uncovertebral hypertrophy with facet degeneration, greater on the left. Mild spinal stenosis. Moderate left with mild right C3 foraminal narrowing. C3-C4: Diffuse disc bulge with intervertebral disc space narrowing. Facet ligamentum flavum hypertrophy. Severe spinal  stenosis with compression of the spinal cord. Thecal sac measures 4 mm in AP diameter. Severe bilateral C4 foraminal stenosis. C4-C5: Chronic disc space height loss with partial ankylosis of the C4 and C5 vertebral bodies. Broad central disc osteophyte indents the ventral thecal sac and impinges upon the ventral spinal cord. Moderate spinal stenosis. Severe bilateral C5 foraminal stenosis.  C5-C6: Chronic diffuse degenerative disc osteophyte with intervertebral disc space narrowing. Facet ligamentum flavum hypertrophy. Severe canal stenosis with compression of the spinal cord. Thecal sac approximately 4 mm in AP diameter. Severe C6 foraminal stenosis. C6-C7: Chronic diffuse degenerative disc osteophyte and facet hypertrophy with resultant severe spinal stenosis. Impression of the cervical spinal cord, similar to additional levels. Severe bilateral C7 foraminal stenosis. C7-T1: Anterolisthesis. Diffuse disc bulge. Advanced facet arthropathy bilaterally. Mild spinal stenosis. Moderate bilateral C8 foraminal narrowing. MRI THORACIC SPINE FINDINGS Alignment: Mild dextroscoliosis. Vertebral bodies otherwise normally aligned with preservation of the normal thoracic kyphosis. Trace anterolisthesis of T1 on T2 noted. Vertebrae: Vertebral body heights maintained without evidence for acute or chronic fracture. Prominent reactive endplate changes noted about the C6-7 interspace. Degenerative disc space height loss with partial ankylosis present at T7-8 anteriorly. Bone marrow signal intensity within normal limits. No worrisome osseous lesions. Cord: Signal intensity within the thoracic spinal cord is normal. The Paraspinal and other soft tissues: Paraspinous soft tissues within normal limits. Partially visualized lungs are grossly clear. Atherosclerotic change noted within the aorta. Visualized visceral structures within normal limits. Disc levels: Multilevel noncompressive disc bulging seen within the thoracic spine at nearly all levels. Note made of a right paracentral disc protrusion at T5-6 flattening the right hemi cord without cord signal changes (series 24, image 14). Left paracentral disc protrusion at T9-10 indents the left ventral thecal sac without stenosis or cord deformity (series 24, image 26). Multilevel facet arthropathy present within the lower thoracic spine, most notable at T11-12. No  significant spinal stenosis. Mild to moderate bilateral foraminal narrowing present at T2-3 and T10-11. No other significant foraminal encroachment. MRI LUMBAR SPINE FINDINGS Segmentation:  Normal. Alignment: Straightening with reversal of the normal lumbar lordosis. Chronic mild retrolisthesis of L1 on L2 through L5-S1. Vertebrae: Vertebral body heights maintained without evidence for acute or chronic fracture. Prominent reactive endplate changes present about the L1-2 and L4-5 interspaces. Bone marrow signal intensity normal. No discrete or worrisome osseous lesions. Conus medullaris: Mildly low lying extending to the L2-3 level. Distal spinal cord and conus are normal in appearance. Paraspinal and other soft tissues: Paraspinous soft tissues demonstrate no acute abnormality. Chronic fatty atrophy noted within the lower paraspinous musculature. Visualized visceral structures within normal limits. Disc levels: L1-2: Diffuse disc bulge with disc desiccation and intervertebral disc space narrowing. Disc bulging eccentric to the right with associated right far lateral reactive endplate changes. Mild right lateral recess narrowing without significant canal stenosis. Foramina are patent. L2-3: Chronic intervertebral disc space narrowing with diffuse disc bulge and reactive endplate changes. Moderate facet hypertrophy. Resultant mild canal with moderate bilateral lateral recess narrowing. Moderate bilateral L2 foraminal stenosis. L3-4: Diffuse disc bulge with intervertebral disc space narrowing. Moderate facet arthrosis with ligamentum flavum hypertrophy. Resultant moderate to severe canal and bilateral subarticular stenosis. Thecal sac measures 8 mm in AP diameter. Moderate bilateral L3 foraminal stenosis, slightly worse on the right. L4-5: Chronic intervertebral disc space narrowing with diffuse disc bulge. Moderate to advanced facet arthrosis with ligamentum flavum hypertrophy. Severe canal and bilateral subarticular  stenosis. Thecal sac measures approximately 4-5 mm in AP diameter.  Moderate bilateral L4 foraminal stenosis. L5-S1: Diffuse disc bulge with chronic intervertebral disc space narrowing. Superimposed small central disc protrusion indents the ventral thecal sac. Mild facet hypertrophy. No significant canal stenosis. Mild right L5 foraminal narrowing. IMPRESSION: MRI HEAD SPINE IMPRESSION: 1. No acute intracranial process identified. 2. Age-related cerebral atrophy with mild chronic small vessel ischemic disease. 3. Chronic right sphenoid sinusitis. MRI CERVICAL SPINE IMPRESSION: 1. 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 2. Question faint focus of myelomalacia within the cervical spinal cord at the level of C6 as above. 3. Multifactorial degenerative changes with resultant severe multilevel foraminal narrowing as above, severe bilaterally at C3-4 through C6-7. 4. Right greater than left facet arthritis with associated reactive edema as above. Finding could serve as a source for neck pain. MRI THORACIC SPINE IMPRESSION: 1. Multilevel noncompressive disc bulging throughout the thoracic spine without significant canal stenosis. Superimposed small disc protrusions at T5-6 and T9-10 as above. 2. Multilevel facet arthropathy within the lower thoracic spine, most prevalent at T11-12. 3. Prominent discogenic reactive endplate changes at W2-9. MRI LUMBAR SPINE IMPRESSION: 1. Multilevel degenerative disc disease and facet arthrosis with resulting canal stenosis at L2-3 through L4-5. Changes most pronounced at the L4-5 level were there is severe spinal stenosis. 2. Multifactorial degenerative changes with resultant moderate multilevel foraminal narrowing at L2-3 through L4-5 bilaterally. Electronically Signed   By: Jeannine Boga M.D.   On: 05/21/2017 22:44   Mr Cervical Spine Wo Contrast  Result Date: 05/21/2017 CLINICAL DATA:  Initial evaluation  for acute right upper and lower extremity weakness. EXAM: MRI HEAD WITHOUT CONTRAST MRI CERVICAL, THORACIC AND LUMBAR SPINE WITHOUT CONTRAST TECHNIQUE: Multiplanar and multiecho pulse sequences of the cervical spine, to include the craniocervical junction and cervicothoracic junction, and thoracic and lumbar spine, were obtained without intravenous contrast. COMPARISON:  Prior radiograph from 09/16/2015. FINDINGS: MRI HEAD FINDINGS Generalized age related cerebral atrophy. Mild chronic microvascular ischemic disease. No evidence for acute or subacute ischemic infarct. Gray-white matter differentiation maintained. No encephalomalacia to suggest chronic infarction. No foci of susceptibility artifact to suggest acute or chronic intracranial hemorrhage. No mass lesion, midline shift or mass effect. No hydrocephalus. No extra-axial fluid collection. Major dural sinuses are grossly patent. Pituitary gland within normal limits. Midline structures intact and normal. Major intracranial vascular flow voids are maintained. Right vertebral artery dominant with hypoplastic left vertebral artery. Degenerative thickening of the tectorial membrane with secondary narrowing at the craniocervical junction. Bone marrow signal intensity within normal limits. No scalp soft tissue abnormality. Globes and oval soft tissues within normal limits. Patient status post lens extraction bilaterally. Mild scattered mucosal thickening within the ethmoidal air cells. Chronic right sphenoid sinusitis noted. Paranasal sinuses otherwise clear. Small bilateral mastoid effusions, slightly larger on the left, of doubtful significance. Inner ear structures normal. MRI CERVICAL SPINE FINDINGS Alignment: Straightening of the normal cervical lordosis. Trace anterolisthesis of C3 on C4. 3 mm anterolisthesis of C7 on T1. Vertebrae: Vertebral body heights maintained without evidence for acute or chronic fracture. Prominent reactive endplate changes present about  the C5-6 and C6-7 interspaces. C4 and C5 vertebral bodies partially ankylosed due to chronic degenerative height loss at the C4-5 disc. Bone marrow signal intensity within normal limits. No discrete or worrisome osseous lesions. Reactive edema about the left C4-5 facet as well as the right C2-3 through C5-6 facets due to facet arthritis, most prevalent at C5-6 on the right (series 15, image 2). Cord: Possible tiny  focus of T2 signal abnormality within the cervical spinal cord at the level of C6, which may reflect a small focus of myelomalacia (series 12, image 9). Signal intensity within the cervical spinal cord otherwise within normal limits. Cord is somewhat atrophic in appearance due to severe multifocal stenosis. Posterior Fossa, vertebral arteries, paraspinal tissues: Degenerative changes seen about the C1-2 articulation with thickening of the tectorial membrane. Superimposed 4 mm synovial cyst noted (series 12, image 9). Secondary mild narrowing at the craniocervical junction. Paraspinous soft tissues within normal limits. Normal intravascular flow voids within the vertebral arteries grossly maintained. Disc levels: C2-C3: Intervertebral disc space narrowing with disc desiccation. Bilateral uncovertebral hypertrophy with facet degeneration, greater on the left. Mild spinal stenosis. Moderate left with mild right C3 foraminal narrowing. C3-C4: Diffuse disc bulge with intervertebral disc space narrowing. Facet ligamentum flavum hypertrophy. Severe spinal stenosis with compression of the spinal cord. Thecal sac measures 4 mm in AP diameter. Severe bilateral C4 foraminal stenosis. C4-C5: Chronic disc space height loss with partial ankylosis of the C4 and C5 vertebral bodies. Broad central disc osteophyte indents the ventral thecal sac and impinges upon the ventral spinal cord. Moderate spinal stenosis. Severe bilateral C5 foraminal stenosis. C5-C6: Chronic diffuse degenerative disc osteophyte with intervertebral  disc space narrowing. Facet ligamentum flavum hypertrophy. Severe canal stenosis with compression of the spinal cord. Thecal sac approximately 4 mm in AP diameter. Severe C6 foraminal stenosis. C6-C7: Chronic diffuse degenerative disc osteophyte and facet hypertrophy with resultant severe spinal stenosis. Impression of the cervical spinal cord, similar to additional levels. Severe bilateral C7 foraminal stenosis. C7-T1: Anterolisthesis. Diffuse disc bulge. Advanced facet arthropathy bilaterally. Mild spinal stenosis. Moderate bilateral C8 foraminal narrowing. MRI THORACIC SPINE FINDINGS Alignment: Mild dextroscoliosis. Vertebral bodies otherwise normally aligned with preservation of the normal thoracic kyphosis. Trace anterolisthesis of T1 on T2 noted. Vertebrae: Vertebral body heights maintained without evidence for acute or chronic fracture. Prominent reactive endplate changes noted about the C6-7 interspace. Degenerative disc space height loss with partial ankylosis present at T7-8 anteriorly. Bone marrow signal intensity within normal limits. No worrisome osseous lesions. Cord: Signal intensity within the thoracic spinal cord is normal. The Paraspinal and other soft tissues: Paraspinous soft tissues within normal limits. Partially visualized lungs are grossly clear. Atherosclerotic change noted within the aorta. Visualized visceral structures within normal limits. Disc levels: Multilevel noncompressive disc bulging seen within the thoracic spine at nearly all levels. Note made of a right paracentral disc protrusion at T5-6 flattening the right hemi cord without cord signal changes (series 24, image 14). Left paracentral disc protrusion at T9-10 indents the left ventral thecal sac without stenosis or cord deformity (series 24, image 26). Multilevel facet arthropathy present within the lower thoracic spine, most notable at T11-12. No significant spinal stenosis. Mild to moderate bilateral foraminal narrowing  present at T2-3 and T10-11. No other significant foraminal encroachment. MRI LUMBAR SPINE FINDINGS Segmentation:  Normal. Alignment: Straightening with reversal of the normal lumbar lordosis. Chronic mild retrolisthesis of L1 on L2 through L5-S1. Vertebrae: Vertebral body heights maintained without evidence for acute or chronic fracture. Prominent reactive endplate changes present about the L1-2 and L4-5 interspaces. Bone marrow signal intensity normal. No discrete or worrisome osseous lesions. Conus medullaris: Mildly low lying extending to the L2-3 level. Distal spinal cord and conus are normal in appearance. Paraspinal and other soft tissues: Paraspinous soft tissues demonstrate no acute abnormality. Chronic fatty atrophy noted within the lower paraspinous musculature. Visualized visceral structures within normal limits. Disc levels:  L1-2: Diffuse disc bulge with disc desiccation and intervertebral disc space narrowing. Disc bulging eccentric to the right with associated right far lateral reactive endplate changes. Mild right lateral recess narrowing without significant canal stenosis. Foramina are patent. L2-3: Chronic intervertebral disc space narrowing with diffuse disc bulge and reactive endplate changes. Moderate facet hypertrophy. Resultant mild canal with moderate bilateral lateral recess narrowing. Moderate bilateral L2 foraminal stenosis. L3-4: Diffuse disc bulge with intervertebral disc space narrowing. Moderate facet arthrosis with ligamentum flavum hypertrophy. Resultant moderate to severe canal and bilateral subarticular stenosis. Thecal sac measures 8 mm in AP diameter. Moderate bilateral L3 foraminal stenosis, slightly worse on the right. L4-5: Chronic intervertebral disc space narrowing with diffuse disc bulge. Moderate to advanced facet arthrosis with ligamentum flavum hypertrophy. Severe canal and bilateral subarticular stenosis. Thecal sac measures approximately 4-5 mm in AP diameter. Moderate  bilateral L4 foraminal stenosis. L5-S1: Diffuse disc bulge with chronic intervertebral disc space narrowing. Superimposed small central disc protrusion indents the ventral thecal sac. Mild facet hypertrophy. No significant canal stenosis. Mild right L5 foraminal narrowing. IMPRESSION: MRI HEAD SPINE IMPRESSION: 1. No acute intracranial process identified. 2. Age-related cerebral atrophy with mild chronic small vessel ischemic disease. 3. Chronic right sphenoid sinusitis. MRI CERVICAL SPINE IMPRESSION: 1. 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 2. Question faint focus of myelomalacia within the cervical spinal cord at the level of C6 as above. 3. Multifactorial degenerative changes with resultant severe multilevel foraminal narrowing as above, severe bilaterally at C3-4 through C6-7. 4. Right greater than left facet arthritis with associated reactive edema as above. Finding could serve as a source for neck pain. MRI THORACIC SPINE IMPRESSION: 1. Multilevel noncompressive disc bulging throughout the thoracic spine without significant canal stenosis. Superimposed small disc protrusions at T5-6 and T9-10 as above. 2. Multilevel facet arthropathy within the lower thoracic spine, most prevalent at T11-12. 3. Prominent discogenic reactive endplate changes at P5-9. MRI LUMBAR SPINE IMPRESSION: 1. Multilevel degenerative disc disease and facet arthrosis with resulting canal stenosis at L2-3 through L4-5. Changes most pronounced at the L4-5 level were there is severe spinal stenosis. 2. Multifactorial degenerative changes with resultant moderate multilevel foraminal narrowing at L2-3 through L4-5 bilaterally. Electronically Signed   By: Jeannine Boga M.D.   On: 05/21/2017 22:44   Mr Thoracic Spine Wo Contrast  Result Date: 05/21/2017 CLINICAL DATA:  Initial evaluation for acute right upper and lower extremity weakness. EXAM: MRI HEAD WITHOUT  CONTRAST MRI CERVICAL, THORACIC AND LUMBAR SPINE WITHOUT CONTRAST TECHNIQUE: Multiplanar and multiecho pulse sequences of the cervical spine, to include the craniocervical junction and cervicothoracic junction, and thoracic and lumbar spine, were obtained without intravenous contrast. COMPARISON:  Prior radiograph from 09/16/2015. FINDINGS: MRI HEAD FINDINGS Generalized age related cerebral atrophy. Mild chronic microvascular ischemic disease. No evidence for acute or subacute ischemic infarct. Gray-white matter differentiation maintained. No encephalomalacia to suggest chronic infarction. No foci of susceptibility artifact to suggest acute or chronic intracranial hemorrhage. No mass lesion, midline shift or mass effect. No hydrocephalus. No extra-axial fluid collection. Major dural sinuses are grossly patent. Pituitary gland within normal limits. Midline structures intact and normal. Major intracranial vascular flow voids are maintained. Right vertebral artery dominant with hypoplastic left vertebral artery. Degenerative thickening of the tectorial membrane with secondary narrowing at the craniocervical junction. Bone marrow signal intensity within normal limits. No scalp soft tissue abnormality. Globes and oval soft tissues within normal limits. Patient status post lens extraction  bilaterally. Mild scattered mucosal thickening within the ethmoidal air cells. Chronic right sphenoid sinusitis noted. Paranasal sinuses otherwise clear. Small bilateral mastoid effusions, slightly larger on the left, of doubtful significance. Inner ear structures normal. MRI CERVICAL SPINE FINDINGS Alignment: Straightening of the normal cervical lordosis. Trace anterolisthesis of C3 on C4. 3 mm anterolisthesis of C7 on T1. Vertebrae: Vertebral body heights maintained without evidence for acute or chronic fracture. Prominent reactive endplate changes present about the C5-6 and C6-7 interspaces. C4 and C5 vertebral bodies partially  ankylosed due to chronic degenerative height loss at the C4-5 disc. Bone marrow signal intensity within normal limits. No discrete or worrisome osseous lesions. Reactive edema about the left C4-5 facet as well as the right C2-3 through C5-6 facets due to facet arthritis, most prevalent at C5-6 on the right (series 15, image 2). Cord: Possible tiny focus of T2 signal abnormality within the cervical spinal cord at the level of C6, which may reflect a small focus of myelomalacia (series 12, image 9). Signal intensity within the cervical spinal cord otherwise within normal limits. Cord is somewhat atrophic in appearance due to severe multifocal stenosis. Posterior Fossa, vertebral arteries, paraspinal tissues: Degenerative changes seen about the C1-2 articulation with thickening of the tectorial membrane. Superimposed 4 mm synovial cyst noted (series 12, image 9). Secondary mild narrowing at the craniocervical junction. Paraspinous soft tissues within normal limits. Normal intravascular flow voids within the vertebral arteries grossly maintained. Disc levels: C2-C3: Intervertebral disc space narrowing with disc desiccation. Bilateral uncovertebral hypertrophy with facet degeneration, greater on the left. Mild spinal stenosis. Moderate left with mild right C3 foraminal narrowing. C3-C4: Diffuse disc bulge with intervertebral disc space narrowing. Facet ligamentum flavum hypertrophy. Severe spinal stenosis with compression of the spinal cord. Thecal sac measures 4 mm in AP diameter. Severe bilateral C4 foraminal stenosis. C4-C5: Chronic disc space height loss with partial ankylosis of the C4 and C5 vertebral bodies. Broad central disc osteophyte indents the ventral thecal sac and impinges upon the ventral spinal cord. Moderate spinal stenosis. Severe bilateral C5 foraminal stenosis. C5-C6: Chronic diffuse degenerative disc osteophyte with intervertebral disc space narrowing. Facet ligamentum flavum hypertrophy. Severe  canal stenosis with compression of the spinal cord. Thecal sac approximately 4 mm in AP diameter. Severe C6 foraminal stenosis. C6-C7: Chronic diffuse degenerative disc osteophyte and facet hypertrophy with resultant severe spinal stenosis. Impression of the cervical spinal cord, similar to additional levels. Severe bilateral C7 foraminal stenosis. C7-T1: Anterolisthesis. Diffuse disc bulge. Advanced facet arthropathy bilaterally. Mild spinal stenosis. Moderate bilateral C8 foraminal narrowing. MRI THORACIC SPINE FINDINGS Alignment: Mild dextroscoliosis. Vertebral bodies otherwise normally aligned with preservation of the normal thoracic kyphosis. Trace anterolisthesis of T1 on T2 noted. Vertebrae: Vertebral body heights maintained without evidence for acute or chronic fracture. Prominent reactive endplate changes noted about the C6-7 interspace. Degenerative disc space height loss with partial ankylosis present at T7-8 anteriorly. Bone marrow signal intensity within normal limits. No worrisome osseous lesions. Cord: Signal intensity within the thoracic spinal cord is normal. The Paraspinal and other soft tissues: Paraspinous soft tissues within normal limits. Partially visualized lungs are grossly clear. Atherosclerotic change noted within the aorta. Visualized visceral structures within normal limits. Disc levels: Multilevel noncompressive disc bulging seen within the thoracic spine at nearly all levels. Note made of a right paracentral disc protrusion at T5-6 flattening the right hemi cord without cord signal changes (series 24, image 14). Left paracentral disc protrusion at T9-10 indents the left ventral thecal sac without stenosis or  cord deformity (series 24, image 26). Multilevel facet arthropathy present within the lower thoracic spine, most notable at T11-12. No significant spinal stenosis. Mild to moderate bilateral foraminal narrowing present at T2-3 and T10-11. No other significant foraminal  encroachment. MRI LUMBAR SPINE FINDINGS Segmentation:  Normal. Alignment: Straightening with reversal of the normal lumbar lordosis. Chronic mild retrolisthesis of L1 on L2 through L5-S1. Vertebrae: Vertebral body heights maintained without evidence for acute or chronic fracture. Prominent reactive endplate changes present about the L1-2 and L4-5 interspaces. Bone marrow signal intensity normal. No discrete or worrisome osseous lesions. Conus medullaris: Mildly low lying extending to the L2-3 level. Distal spinal cord and conus are normal in appearance. Paraspinal and other soft tissues: Paraspinous soft tissues demonstrate no acute abnormality. Chronic fatty atrophy noted within the lower paraspinous musculature. Visualized visceral structures within normal limits. Disc levels: L1-2: Diffuse disc bulge with disc desiccation and intervertebral disc space narrowing. Disc bulging eccentric to the right with associated right far lateral reactive endplate changes. Mild right lateral recess narrowing without significant canal stenosis. Foramina are patent. L2-3: Chronic intervertebral disc space narrowing with diffuse disc bulge and reactive endplate changes. Moderate facet hypertrophy. Resultant mild canal with moderate bilateral lateral recess narrowing. Moderate bilateral L2 foraminal stenosis. L3-4: Diffuse disc bulge with intervertebral disc space narrowing. Moderate facet arthrosis with ligamentum flavum hypertrophy. Resultant moderate to severe canal and bilateral subarticular stenosis. Thecal sac measures 8 mm in AP diameter. Moderate bilateral L3 foraminal stenosis, slightly worse on the right. L4-5: Chronic intervertebral disc space narrowing with diffuse disc bulge. Moderate to advanced facet arthrosis with ligamentum flavum hypertrophy. Severe canal and bilateral subarticular stenosis. Thecal sac measures approximately 4-5 mm in AP diameter. Moderate bilateral L4 foraminal stenosis. L5-S1: Diffuse disc bulge  with chronic intervertebral disc space narrowing. Superimposed small central disc protrusion indents the ventral thecal sac. Mild facet hypertrophy. No significant canal stenosis. Mild right L5 foraminal narrowing. IMPRESSION: MRI HEAD SPINE IMPRESSION: 1. No acute intracranial process identified. 2. Age-related cerebral atrophy with mild chronic small vessel ischemic disease. 3. Chronic right sphenoid sinusitis. MRI CERVICAL SPINE IMPRESSION: 1. 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 2. Question faint focus of myelomalacia within the cervical spinal cord at the level of C6 as above. 3. Multifactorial degenerative changes with resultant severe multilevel foraminal narrowing as above, severe bilaterally at C3-4 through C6-7. 4. Right greater than left facet arthritis with associated reactive edema as above. Finding could serve as a source for neck pain. MRI THORACIC SPINE IMPRESSION: 1. Multilevel noncompressive disc bulging throughout the thoracic spine without significant canal stenosis. Superimposed small disc protrusions at T5-6 and T9-10 as above. 2. Multilevel facet arthropathy within the lower thoracic spine, most prevalent at T11-12. 3. Prominent discogenic reactive endplate changes at X7-3. MRI LUMBAR SPINE IMPRESSION: 1. Multilevel degenerative disc disease and facet arthrosis with resulting canal stenosis at L2-3 through L4-5. Changes most pronounced at the L4-5 level were there is severe spinal stenosis. 2. Multifactorial degenerative changes with resultant moderate multilevel foraminal narrowing at L2-3 through L4-5 bilaterally. Electronically Signed   By: Jeannine Boga M.D.   On: 05/21/2017 22:44   Mr Lumbar Spine Wo Contrast  Result Date: 05/21/2017 CLINICAL DATA:  Initial evaluation for acute right upper and lower extremity weakness. EXAM: MRI HEAD WITHOUT CONTRAST MRI CERVICAL, THORACIC AND LUMBAR SPINE WITHOUT  CONTRAST TECHNIQUE: Multiplanar and multiecho pulse sequences of the cervical spine, to include the craniocervical junction  and cervicothoracic junction, and thoracic and lumbar spine, were obtained without intravenous contrast. COMPARISON:  Prior radiograph from 09/16/2015. FINDINGS: MRI HEAD FINDINGS Generalized age related cerebral atrophy. Mild chronic microvascular ischemic disease. No evidence for acute or subacute ischemic infarct. Gray-white matter differentiation maintained. No encephalomalacia to suggest chronic infarction. No foci of susceptibility artifact to suggest acute or chronic intracranial hemorrhage. No mass lesion, midline shift or mass effect. No hydrocephalus. No extra-axial fluid collection. Major dural sinuses are grossly patent. Pituitary gland within normal limits. Midline structures intact and normal. Major intracranial vascular flow voids are maintained. Right vertebral artery dominant with hypoplastic left vertebral artery. Degenerative thickening of the tectorial membrane with secondary narrowing at the craniocervical junction. Bone marrow signal intensity within normal limits. No scalp soft tissue abnormality. Globes and oval soft tissues within normal limits. Patient status post lens extraction bilaterally. Mild scattered mucosal thickening within the ethmoidal air cells. Chronic right sphenoid sinusitis noted. Paranasal sinuses otherwise clear. Small bilateral mastoid effusions, slightly larger on the left, of doubtful significance. Inner ear structures normal. MRI CERVICAL SPINE FINDINGS Alignment: Straightening of the normal cervical lordosis. Trace anterolisthesis of C3 on C4. 3 mm anterolisthesis of C7 on T1. Vertebrae: Vertebral body heights maintained without evidence for acute or chronic fracture. Prominent reactive endplate changes present about the C5-6 and C6-7 interspaces. C4 and C5 vertebral bodies partially ankylosed due to chronic degenerative height loss at the C4-5  disc. Bone marrow signal intensity within normal limits. No discrete or worrisome osseous lesions. Reactive edema about the left C4-5 facet as well as the right C2-3 through C5-6 facets due to facet arthritis, most prevalent at C5-6 on the right (series 15, image 2). Cord: Possible tiny focus of T2 signal abnormality within the cervical spinal cord at the level of C6, which may reflect a small focus of myelomalacia (series 12, image 9). Signal intensity within the cervical spinal cord otherwise within normal limits. Cord is somewhat atrophic in appearance due to severe multifocal stenosis. Posterior Fossa, vertebral arteries, paraspinal tissues: Degenerative changes seen about the C1-2 articulation with thickening of the tectorial membrane. Superimposed 4 mm synovial cyst noted (series 12, image 9). Secondary mild narrowing at the craniocervical junction. Paraspinous soft tissues within normal limits. Normal intravascular flow voids within the vertebral arteries grossly maintained. Disc levels: C2-C3: Intervertebral disc space narrowing with disc desiccation. Bilateral uncovertebral hypertrophy with facet degeneration, greater on the left. Mild spinal stenosis. Moderate left with mild right C3 foraminal narrowing. C3-C4: Diffuse disc bulge with intervertebral disc space narrowing. Facet ligamentum flavum hypertrophy. Severe spinal stenosis with compression of the spinal cord. Thecal sac measures 4 mm in AP diameter. Severe bilateral C4 foraminal stenosis. C4-C5: Chronic disc space height loss with partial ankylosis of the C4 and C5 vertebral bodies. Broad central disc osteophyte indents the ventral thecal sac and impinges upon the ventral spinal cord. Moderate spinal stenosis. Severe bilateral C5 foraminal stenosis. C5-C6: Chronic diffuse degenerative disc osteophyte with intervertebral disc space narrowing. Facet ligamentum flavum hypertrophy. Severe canal stenosis with compression of the spinal cord. Thecal sac  approximately 4 mm in AP diameter. Severe C6 foraminal stenosis. C6-C7: Chronic diffuse degenerative disc osteophyte and facet hypertrophy with resultant severe spinal stenosis. Impression of the cervical spinal cord, similar to additional levels. Severe bilateral C7 foraminal stenosis. C7-T1: Anterolisthesis. Diffuse disc bulge. Advanced facet arthropathy bilaterally. Mild spinal stenosis. Moderate bilateral C8 foraminal narrowing. MRI THORACIC SPINE FINDINGS Alignment: Mild dextroscoliosis. Vertebral bodies otherwise normally aligned with preservation of the  normal thoracic kyphosis. Trace anterolisthesis of T1 on T2 noted. Vertebrae: Vertebral body heights maintained without evidence for acute or chronic fracture. Prominent reactive endplate changes noted about the C6-7 interspace. Degenerative disc space height loss with partial ankylosis present at T7-8 anteriorly. Bone marrow signal intensity within normal limits. No worrisome osseous lesions. Cord: Signal intensity within the thoracic spinal cord is normal. The Paraspinal and other soft tissues: Paraspinous soft tissues within normal limits. Partially visualized lungs are grossly clear. Atherosclerotic change noted within the aorta. Visualized visceral structures within normal limits. Disc levels: Multilevel noncompressive disc bulging seen within the thoracic spine at nearly all levels. Note made of a right paracentral disc protrusion at T5-6 flattening the right hemi cord without cord signal changes (series 24, image 14). Left paracentral disc protrusion at T9-10 indents the left ventral thecal sac without stenosis or cord deformity (series 24, image 26). Multilevel facet arthropathy present within the lower thoracic spine, most notable at T11-12. No significant spinal stenosis. Mild to moderate bilateral foraminal narrowing present at T2-3 and T10-11. No other significant foraminal encroachment. MRI LUMBAR SPINE FINDINGS Segmentation:  Normal. Alignment:  Straightening with reversal of the normal lumbar lordosis. Chronic mild retrolisthesis of L1 on L2 through L5-S1. Vertebrae: Vertebral body heights maintained without evidence for acute or chronic fracture. Prominent reactive endplate changes present about the L1-2 and L4-5 interspaces. Bone marrow signal intensity normal. No discrete or worrisome osseous lesions. Conus medullaris: Mildly low lying extending to the L2-3 level. Distal spinal cord and conus are normal in appearance. Paraspinal and other soft tissues: Paraspinous soft tissues demonstrate no acute abnormality. Chronic fatty atrophy noted within the lower paraspinous musculature. Visualized visceral structures within normal limits. Disc levels: L1-2: Diffuse disc bulge with disc desiccation and intervertebral disc space narrowing. Disc bulging eccentric to the right with associated right far lateral reactive endplate changes. Mild right lateral recess narrowing without significant canal stenosis. Foramina are patent. L2-3: Chronic intervertebral disc space narrowing with diffuse disc bulge and reactive endplate changes. Moderate facet hypertrophy. Resultant mild canal with moderate bilateral lateral recess narrowing. Moderate bilateral L2 foraminal stenosis. L3-4: Diffuse disc bulge with intervertebral disc space narrowing. Moderate facet arthrosis with ligamentum flavum hypertrophy. Resultant moderate to severe canal and bilateral subarticular stenosis. Thecal sac measures 8 mm in AP diameter. Moderate bilateral L3 foraminal stenosis, slightly worse on the right. L4-5: Chronic intervertebral disc space narrowing with diffuse disc bulge. Moderate to advanced facet arthrosis with ligamentum flavum hypertrophy. Severe canal and bilateral subarticular stenosis. Thecal sac measures approximately 4-5 mm in AP diameter. Moderate bilateral L4 foraminal stenosis. L5-S1: Diffuse disc bulge with chronic intervertebral disc space narrowing. Superimposed small  central disc protrusion indents the ventral thecal sac. Mild facet hypertrophy. No significant canal stenosis. Mild right L5 foraminal narrowing. IMPRESSION: MRI HEAD SPINE IMPRESSION: 1. No acute intracranial process identified. 2. Age-related cerebral atrophy with mild chronic small vessel ischemic disease. 3. Chronic right sphenoid sinusitis. MRI CERVICAL SPINE IMPRESSION: 1. 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 2. Question faint focus of myelomalacia within the cervical spinal cord at the level of C6 as above. 3. Multifactorial degenerative changes with resultant severe multilevel foraminal narrowing as above, severe bilaterally at C3-4 through C6-7. 4. Right greater than left facet arthritis with associated reactive edema as above. Finding could serve as a source for neck pain. MRI THORACIC SPINE IMPRESSION: 1. Multilevel noncompressive disc bulging throughout the thoracic spine without significant canal  stenosis. Superimposed small disc protrusions at T5-6 and T9-10 as above. 2. Multilevel facet arthropathy within the lower thoracic spine, most prevalent at T11-12. 3. Prominent discogenic reactive endplate changes at L0-7. MRI LUMBAR SPINE IMPRESSION: 1. Multilevel degenerative disc disease and facet arthrosis with resulting canal stenosis at L2-3 through L4-5. Changes most pronounced at the L4-5 level were there is severe spinal stenosis. 2. Multifactorial degenerative changes with resultant moderate multilevel foraminal narrowing at L2-3 through L4-5 bilaterally. Electronically Signed   By: Jeannine Boga M.D.   On: 05/21/2017 22:44    Pertinent items noted in HPI and remainder of comprehensive ROS otherwise negative. Blood pressure (!) 150/64, pulse (!) 59, temperature 97.6 F (36.4 C), temperature source Oral, resp. rate 18, height '5\' 7"'$  (1.702 m), weight 84.7 kg (186 lb 12.8 oz), SpO2 100 %. The patient is awake and  alert. She is oriented and appropriate. Her speech is fluent. Her judgment and insight are intact. Her cranial nerve function is normal bilaterally. Motor examination reveals weakness involving her right wrist extensors grating out at 3/5. She has weakness of her right triceps muscle group rating at 3/5. She has minimal grip and intrinsic function in her right hand. She has moderate intrinsic and grip weakness in her left hand. Lower extremity motor function reveals weakness of hip flexion and knee extension grating out at 4/5 on the left. She has a near-total right-sided foot drop. She has some mild preservation of right-sided plantarflexion. Left lower chamois motor strength reasonably normal.  Assessment/Plan: The patient has evidence of before meals [place] cervical myelopathy secondary to multilevel cervical disc degeneration with marked stenosis with cord compression and resultant signal abnormality worse at the C5-6 level but also present at C3-4 and C6-7. She has accompanying severe lumbar stenosis at L3-4 and L4-5. 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 I discussed options for management. The patient is considering as to whether to undergo anterior cervical decompression and fusion at C3-4, C5-6 and C6-7. I will discuss things with her further tomorrow. I will provisionally stop her anticoagulation.  Albertha Beattie A 05/22/2017, 12:44 PM

## 2017-05-22 NOTE — Progress Notes (Signed)
  PROGRESS NOTE  Patient admitted earlier this morning. See H&P. [name] is a 83 y.o. female, with past medical history significant for chronic kidney disease , osteoarthritis, arthritis of the spine and paroxysmal atrial fibrillation on Xarelto presenting with 2 weeks history of right-sided lower extremity numbness and weakness. She received steroid spinal injection by Dr. Ethelene Halamos (orthopedic surgery) couple of weeks ago without improvement in right sciatica. She has now developed worsening right lower extremity weakness and presents to the hospital.   -Neurology consulted -MRI brain negative for stroke -MRI spine revealed Severe cervical spondylolysis with resultant severe diffuse canal stenosis, most severe at C3-4, C5-6, and C6-7. Multilevel facet arthropathy within the lower thoracic spine most prevalent at T11-12. Multilevel degenerative disc disease and facet arthrosis with resulting canal stenosis at L2-3 through L4-5. Changes most pronounced at the L4-5 level were there is severe spinal stenosis. -I spoke with Dr. Jordan LikesPool (neurosurgery) for consultation -I will hold Xarelto for possible surgical intervention. If surgical intervention not pursued during hospitalization, will resume for stroke prevention in setting of paroxysmal atrial fibrillation.  -PT OT -Discussed with daughter at bedside    Noralee StainJennifer Criag Wicklund, DO [facility] [email or web address] Password [email or web address] 05/22/2017, 12:15 PM

## 2017-05-22 NOTE — Progress Notes (Signed)
NEURO HOSPITALIST PROGRESS NOTE  Subjective: Patient found sitting at edge of bed eating lunch. Family at bedside. States "I feel about the same". Voices no new complaints. No acute events reported overnight. States was able to ambulate with PT to bedside commode this AM.  Exam: Vitals:   05/22/17 0400 05/22/17 0902  BP: (!) 141/62 (!) 150/64  Pulse: (!) 51 (!) 59  Resp: 18 18  Temp: 97.7 F (36.5 C) 97.6 F (36.4 C)  SpO2: 100% 100%   HEENT-  Normocephalic, no lesions, without obvious abnormality.  Normal external eye and conjunctiva.   Cardiovascular- regular rate and rhythm, S1, S2 normal, no murmur, click, rub or gallop, pulses palpable throughout   Lungs- chest clear, no wheezing, rales Abdomen- soft, non-tender; bowel sounds normal; no masses,  no organomegaly  Neuro:  CN: Pupils are equal and round. They are symmetrically reactive from 3-->2 mm. EOMI without nystagmus. Facial sensation is intact to light touch. Face is symmetric at rest with normal strength and mobility. Hearing is intact to conversational voice. Palate elevates symmetrically and uvula is midline. Voice is normal in tone, pitch and quality. Bilateral SCM and trapezii are 5/5. Tongue is midline with normal bulk and mobility.  Motor:  RUE: 4/5 proximally with 4-/5 grip strength and intrinsic muscles of the hand. Flexion contractures of digits are noted. Decreased tone of wrist and hand.  LUE: 5/5 RLE: 2/5 proximal and distal; somewhat decreased effort LLE: 3-4/5 proximal and distal. Sensation: Intact to light touch. Severely impaired proprioception bilateral toes. + vibration  Coordination: Finger-to-nose  without dysmetria   Medications:  Scheduled: . amiodarone  200 mg Oral QHS  . amLODipine  2.5 mg Oral QPC breakfast  . bisacodyl  10 mg Rectal Daily  . loratadine  10 mg Oral Daily  . metoprolol succinate  100 mg Oral BID  . pantoprazole  40 mg Oral Daily  . rOPINIRole  1 mg Oral QHS    Continuous: . sodium chloride 50 mL/hr at 05/22/17 0321   WUJ:[id number]:albuterol, ALPRAZolam, LORazepam, oxyCODONE, senna-docusate  Pertinent Labs/Diagnostics: No results for input(s): GLUCAP in the last 168 hours. Recent Labs  Lab 05/21/17 1055 05/22/17 0620  NA 133*  --   K 4.1  --   CL 101  --   CO2 24  --   GLUCOSE 111*  --   BUN 14  --   CREATININE 1.09* 1.24*  CALCIUM 9.0  --    No results for input(s): AST, ALT, ALKPHOS, BILITOT, PROT, ALBUMIN in the last 168 hours. Recent Labs  Lab 05/21/17 1055 05/22/17 0620  WBC 4.1 6.0  HGB 14.5 14.1  HCT 42.2 41.6  MCV 90.9 91.2  PLT 198 209   No results for input(s): CKTOTAL, CKMB, CKMBINDEX, TROPONINI in the last 168 hours. No results for input(s): LABPROT, INR in the last 72 hours. Recent Labs    05/21/17 1354  COLORURINE STRAW*  LABSPEC 1.008  PHURINE 8.0  GLUCOSEU NEGATIVE  HGBUR NEGATIVE  BILIRUBINUR NEGATIVE  KETONESUR NEGATIVE  PROTEINUR NEGATIVE  NITRITE NEGATIVE  LEUKOCYTESUR LARGE*       Component Value Date/Time   CHOL 167 05/22/2017 0620   TRIG 73 05/22/2017 0620   HDL 81 05/22/2017 0620   CHOLHDL 2.1 05/22/2017 0620   VLDL 15 05/22/2017 0620   LDLCALC 71 05/22/2017 0620   Lab Results  Component Value Date   HGBA1C 5.5 05/22/2017      Component Value Date/Time   LABOPIA NONE DETECTED 05/26/2013 2034  COCAINSCRNUR NONE DETECTED 05/26/2013 2034   LABBENZ NONE DETECTED 05/26/2013 2034   AMPHETMU NONE DETECTED 05/26/2013 2034   THCU NONE DETECTED 05/26/2013 2034   LABBARB NONE DETECTED 05/26/2013 2034    No results for input(s): ETH in the last 168 hours.   IMAGING: I have personally reviewed the radiological images below and agree with the radiology interpretations.  Result Date: 05/21/2017 MRI HEAD WITHOUT CONTRAST MRI CERVICAL, THORACIC AND LUMBAR SPINE WITHOUT CONTRAST : IMPRESSION: MRI HEAD SPINE IMPRESSION: 1. No acute intracranial process identified. 2. Age-related cerebral atrophy  with mild chronic small vessel ischemic disease. 3. Chronic right sphenoid sinusitis. MRI CERVICAL SPINE IMPRESSION: 1. 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 2. Question faint focus of myelomalacia within the cervical spinal cord at the level of C6 as above. 3. Multifactorial degenerative changes with resultant severe multilevel foraminal narrowing as above, severe bilaterally at C3-4 through C6-7. 4. Right greater than left facet arthritis with associated reactive edema as above. Finding could serve as a source for neck pain. MRI THORACIC SPINE IMPRESSION: 1. Multilevel noncompressive disc bulging throughout the thoracic spine without significant canal stenosis. Superimposed small disc protrusions at T5-6 and T9-10 as above. 2. Multilevel facet arthropathy within the lower thoracic spine, most prevalent at T11-12. 3. Prominent discogenic reactive endplate changes at C6-7. MRI LUMBAR SPINE IMPRESSION: 1. Multilevel degenerative disc disease and facet arthrosis with resulting canal stenosis at L2-3 through L4-5. Changes most pronounced at the L4-5 level were there is severe spinal stenosis. 2. Multifactorial degenerative changes with resultant moderate multilevel foraminal narrowing at L2-3 through L4-5 bilaterally.    Assessment: 83 year old female with a 4 week history of worsened lower extremity weakness, right worse than left, in conjunction with progressive right arm and hand weakness.    Likely "Double crush" phenomenon - Right sided sciatica and severe cervical spinal stenosis with cauda equina compression versus impingement seen on MRI L-spine at the L4-5 level.   Chronic disc protrusions at the C3-4 through C6-7 levels, with associated impingement and/or compression of the spinal cord at these levels.  Possible focal exiting right L5 or S1 compression as it traverses the neural foramen also a consideration.   Abnormal  proprioception at toes. Most likely due to cervical cord compressions and/or a neuropathy. Dorsal column dysfunction due to B12 or copper deficiency is also a consideration.   Plan: Neuro exam stable Neurosurgery Consultation pending Continue PT/OT  AFIB with Hx of TIA If no surgery planned - resume Xaralto and ASA for Stroke prevention in the setting of AFIB  B12 or copper deficiency  B12 and serum copper level ordered - results pending   Will continue to follow   [name]. ANP-C [facility] 05/22/2017, 11:22 AM   NEUROHOSPITALIST ADDENDUM Seen and examined the patient this AM. Patient deciding on surgery.   Lisa SpinnerSushanth Caprice Mccaffrey MD [facility] [phone]  If 7pm to 7am, please call on call as listed on AMION.

## 2017-05-23 DIAGNOSIS — Z01810 Encounter for preprocedural cardiovascular examination: Secondary | ICD-10-CM

## 2017-05-23 DIAGNOSIS — I480 Paroxysmal atrial fibrillation: Secondary | ICD-10-CM

## 2017-05-23 DIAGNOSIS — R001 Bradycardia, unspecified: Secondary | ICD-10-CM

## 2017-05-23 LAB — BASIC METABOLIC PANEL
Anion gap: 7 (ref 5–15)
BUN: 24 mg/dL — AB (ref 6–20)
CO2: 24 mmol/L (ref 22–32)
CREATININE: 1.3 mg/dL — AB (ref 0.44–1.00)
Calcium: 8.6 mg/dL — ABNORMAL LOW (ref 8.9–10.3)
Chloride: 103 mmol/L (ref 101–111)
GFR calc Af Amer: 42 mL/min — ABNORMAL LOW (ref >60)
GFR, EST NON AFRICAN AMERICAN: 36 mL/min — AB (ref >60)
GLUCOSE: 96 mg/dL (ref 65–99)
POTASSIUM: 4 mmol/L (ref 3.5–5.1)
Sodium: 134 mmol/L — ABNORMAL LOW (ref 135–145)

## 2017-05-23 MED ORDER — CEFAZOLIN SODIUM-DEXTROSE 2-4 GM/100ML-% IV SOLN
2.0000 g | INTRAVENOUS | Status: AC
  Administered 2017-05-24: 2 g via INTRAVENOUS
  Filled 2017-05-23 (×2): qty 100

## 2017-05-23 MED ORDER — METOPROLOL SUCCINATE ER 25 MG PO TB24
25.0000 mg | ORAL_TABLET | Freq: Two times a day (BID) | ORAL | Status: DC
  Administered 2017-05-23 – 2017-05-26 (×6): 25 mg via ORAL
  Filled 2017-05-23 (×6): qty 1

## 2017-05-23 NOTE — Progress Notes (Signed)
PROGRESS NOTE    NETTY SULLIVANT  ZOX:[id number] DOB: 26-May-1928 DOA: 05/21/2017 PCP: Lovenia Kim, PA-C     Brief Narrative:  DorothyBrownis a89 y.o.female,with past medical history significant for chronic kidney disease ,osteoarthritis,arthritis of the spine and paroxysmal atrial fibrillation on Xareltopresenting with 2 weekshistory of right-sided lower extremity numbness and weakness. She received steroid spinal injection by Dr. Ethelene Hal (orthopedic surgery) couple of weeks ago without improvement in right sciatica. She has now developed worsening right lower extremity weakness and presents to the hospital.   Assessment & Plan:   Active Problems:   Right sided weakness  Right sided weakness secondary to severe cervical stenosis with myelopathy  -MRI brain negative for stroke -MRI spine revealed severe cervical spondylolysis with resultant severe diffuse canal stenosis, most severe at C3-4, C5-6, and C6-7. Multilevel facet arthropathy within the lower thoracic spine most prevalent at T11-12. Multilevel degenerative disc disease and facet arthrosis with resulting canal stenosis at L2-3 through L4-5. Changes most pronounced at the L4-5 level were there is severe spinal stenosis. -Neurology and neurosurgery consulted -Planning for anterior cervical discectomy with fusion on 11/12  [facility] cardiology for surgical clearance  -Will need PT OT, possibly SNF on discharge   Paroxysmal A fib -Sinus rhythm currently -Holding xarelto  -Continue amiodarone, toprol  Chronic diastolic HF -Stable, euvolemic   CKD stage 3 -Baseline Cr 1 -Stable, monitor   HTN -Continue norvasc, toprol    DVT prophylaxis: SCD Code Status: DNR Family Communication: Daughter at bedside Disposition Plan: Pending surgical intervention   Consultants:   Neurology  Neurosurgery  Procedures:   None  Antimicrobials:  Anti-infectives (From admission, onward)   Start     Dose/Rate Route  Frequency Ordered Stop   05/24/17 0600  ceFAZolin (ANCEF) IVPB 2g/100 mL premix     2 g 200 mL/hr over 30 Minutes Intravenous On call to O.R. 05/23/17 8119 05/25/17 0559       Subjective: Patient without any new complaints today.  States that she continues to have weakness on the right side.  Denies any chest pain or shortness of breath, no nausea vomiting or diarrhea.  Objective: Vitals:   05/22/17 2143 05/23/17 0158 05/23/17 0441 05/23/17 0912  BP: (!) 141/67 140/71 (!) 128/56 (!) 124/59  Pulse: (!) 56 62 (!) 57 (!) 56  Resp:  20 20   Temp:  98.2 F (36.8 C) 98.3 F (36.8 C) 98.8 F (37.1 C)  TempSrc:  Oral Oral Oral  SpO2:  97% 96% 96%  Weight:      Height:        Intake/Output Summary (Last 24 hours) at 05/23/2017 1233 Last data filed at 05/23/2017 1478 Gross per 24 hour  Intake 480 ml  Output -  Net 480 ml   Filed Weights   05/22/17 0200  Weight: 84.7 kg (186 lb 12.8 oz)    Examination:  General exam: Appears calm and comfortable  Respiratory system: Clear to auscultation. Respiratory effort normal. Cardiovascular system: S1 & S2 heard, regular rhythm, rate 50s. No JVD, murmurs, rubs, gallops or clicks. No pedal edema. Gastrointestinal system: Abdomen is nondistended, soft and nontender. No organomegaly or masses felt. Normal bowel sounds heard. Central nervous system: Alert and oriented. CN 2-12 grossly in tact, RLE and RUE weaker compared to left  Extremities: Symmetric in appearance  Skin: No rashes, lesions or ulcers Psychiatry: Judgement and insight appear normal. Mood & affect appropriate.   Data Reviewed: I have personally reviewed following labs and imaging studies  CBC: Recent Labs  Lab 05/21/17 1055 05/22/17 0620  WBC 4.1 6.0  HGB 14.5 14.1  HCT 42.2 41.6  MCV 90.9 91.2  PLT 198 209   Basic Metabolic Panel: Recent Labs  Lab 05/21/17 1055 05/22/17 0620 05/23/17 0345  NA 133*  --  134*  K 4.1  --  4.0  CL 101  --  103  CO2 24  --  24   GLUCOSE 111*  --  96  BUN 14  --  24*  CREATININE 1.09* 1.24* 1.30*  CALCIUM 9.0  --  8.6*   GFR: Estimated Creatinine Clearance: 34.1 mL/min (A) (by C-G formula based on SCr of 1.3 mg/dL (H)). Liver Function Tests: No results for input(s): AST, ALT, ALKPHOS, BILITOT, PROT, ALBUMIN in the last 168 hours. No results for input(s): LIPASE, AMYLASE in the last 168 hours. No results for input(s): AMMONIA in the last 168 hours. Coagulation Profile: No results for input(s): INR, PROTIME in the last 168 hours. Cardiac Enzymes: No results for input(s): CKTOTAL, CKMB, CKMBINDEX, TROPONINI in the last 168 hours. BNP (last 3 results) No results for input(s): PROBNP in the last 8760 hours. HbA1C: Recent Labs    05/22/17 0620  HGBA1C 5.5   CBG: No results for input(s): GLUCAP in the last 168 hours. Lipid Profile: Recent Labs    05/22/17 0620  CHOL 167  HDL 81  LDLCALC 71  TRIG 73  CHOLHDL 2.1   Thyroid Function Tests: No results for input(s): TSH, T4TOTAL, FREET4, T3FREE, THYROIDAB in the last 72 hours. Anemia Panel: Recent Labs    05/22/17 1459  VITAMINB12 874   Sepsis Labs: No results for input(s): PROCALCITON, LATICACIDVEN in the last 168 hours.  No results found for this or any previous visit (from the past 240 hour(s)).     Radiology Studies: Mr Brain Wo Contrast  Result Date: 05/21/2017 CLINICAL DATA:  Initial evaluation for acute right upper and lower extremity weakness. EXAM: MRI HEAD WITHOUT CONTRAST MRI CERVICAL, THORACIC AND LUMBAR SPINE WITHOUT CONTRAST TECHNIQUE: Multiplanar and multiecho pulse sequences of the cervical spine, to include the craniocervical junction and cervicothoracic junction, and thoracic and lumbar spine, were obtained without intravenous contrast. COMPARISON:  Prior radiograph from 09/16/2015. FINDINGS: MRI HEAD FINDINGS Generalized age related cerebral atrophy. Mild chronic microvascular ischemic disease. No evidence for acute or subacute  ischemic infarct. Gray-white matter differentiation maintained. No encephalomalacia to suggest chronic infarction. No foci of susceptibility artifact to suggest acute or chronic intracranial hemorrhage. No mass lesion, midline shift or mass effect. No hydrocephalus. No extra-axial fluid collection. Major dural sinuses are grossly patent. Pituitary gland within normal limits. Midline structures intact and normal. Major intracranial vascular flow voids are maintained. Right vertebral artery dominant with hypoplastic left vertebral artery. Degenerative thickening of the tectorial membrane with secondary narrowing at the craniocervical junction. Bone marrow signal intensity within normal limits. No scalp soft tissue abnormality. Globes and oval soft tissues within normal limits. Patient status post lens extraction bilaterally. Mild scattered mucosal thickening within the ethmoidal air cells. Chronic right sphenoid sinusitis noted. Paranasal sinuses otherwise clear. Small bilateral mastoid effusions, slightly larger on the left, of doubtful significance. Inner ear structures normal. MRI CERVICAL SPINE FINDINGS Alignment: Straightening of the normal cervical lordosis. Trace anterolisthesis of C3 on C4. 3 mm anterolisthesis of C7 on T1. Vertebrae: Vertebral body heights maintained without evidence for acute or chronic fracture. Prominent reactive endplate changes present about the C5-6 and C6-7 interspaces. C4 and C5 vertebral bodies partially ankylosed  due to chronic degenerative height loss at the C4-5 disc. Bone marrow signal intensity within normal limits. No discrete or worrisome osseous lesions. Reactive edema about the left C4-5 facet as well as the right C2-3 through C5-6 facets due to facet arthritis, most prevalent at C5-6 on the right (series 15, image 2). Cord: Possible tiny focus of T2 signal abnormality within the cervical spinal cord at the level of C6, which may reflect a small focus of myelomalacia (series  12, image 9). Signal intensity within the cervical spinal cord otherwise within normal limits. Cord is somewhat atrophic in appearance due to severe multifocal stenosis. Posterior Fossa, vertebral arteries, paraspinal tissues: Degenerative changes seen about the C1-2 articulation with thickening of the tectorial membrane. Superimposed 4 mm synovial cyst noted (series 12, image 9). Secondary mild narrowing at the craniocervical junction. Paraspinous soft tissues within normal limits. Normal intravascular flow voids within the vertebral arteries grossly maintained. Disc levels: C2-C3: Intervertebral disc space narrowing with disc desiccation. Bilateral uncovertebral hypertrophy with facet degeneration, greater on the left. Mild spinal stenosis. Moderate left with mild right C3 foraminal narrowing. C3-C4: Diffuse disc bulge with intervertebral disc space narrowing. Facet ligamentum flavum hypertrophy. Severe spinal stenosis with compression of the spinal cord. Thecal sac measures 4 mm in AP diameter. Severe bilateral C4 foraminal stenosis. C4-C5: Chronic disc space height loss with partial ankylosis of the C4 and C5 vertebral bodies. Broad central disc osteophyte indents the ventral thecal sac and impinges upon the ventral spinal cord. Moderate spinal stenosis. Severe bilateral C5 foraminal stenosis. C5-C6: Chronic diffuse degenerative disc osteophyte with intervertebral disc space narrowing. Facet ligamentum flavum hypertrophy. Severe canal stenosis with compression of the spinal cord. Thecal sac approximately 4 mm in AP diameter. Severe C6 foraminal stenosis. C6-C7: Chronic diffuse degenerative disc osteophyte and facet hypertrophy with resultant severe spinal stenosis. Impression of the cervical spinal cord, similar to additional levels. Severe bilateral C7 foraminal stenosis. C7-T1: Anterolisthesis. Diffuse disc bulge. Advanced facet arthropathy bilaterally. Mild spinal stenosis. Moderate bilateral C8 foraminal  narrowing. MRI THORACIC SPINE FINDINGS Alignment: Mild dextroscoliosis. Vertebral bodies otherwise normally aligned with preservation of the normal thoracic kyphosis. Trace anterolisthesis of T1 on T2 noted. Vertebrae: Vertebral body heights maintained without evidence for acute or chronic fracture. Prominent reactive endplate changes noted about the C6-7 interspace. Degenerative disc space height loss with partial ankylosis present at T7-8 anteriorly. Bone marrow signal intensity within normal limits. No worrisome osseous lesions. Cord: Signal intensity within the thoracic spinal cord is normal. The Paraspinal and other soft tissues: Paraspinous soft tissues within normal limits. Partially visualized lungs are grossly clear. Atherosclerotic change noted within the aorta. Visualized visceral structures within normal limits. Disc levels: Multilevel noncompressive disc bulging seen within the thoracic spine at nearly all levels. Note made of a right paracentral disc protrusion at T5-6 flattening the right hemi cord without cord signal changes (series 24, image 14). Left paracentral disc protrusion at T9-10 indents the left ventral thecal sac without stenosis or cord deformity (series 24, image 26). Multilevel facet arthropathy present within the lower thoracic spine, most notable at T11-12. No significant spinal stenosis. Mild to moderate bilateral foraminal narrowing present at T2-3 and T10-11. No other significant foraminal encroachment. MRI LUMBAR SPINE FINDINGS Segmentation:  Normal. Alignment: Straightening with reversal of the normal lumbar lordosis. Chronic mild retrolisthesis of L1 on L2 through L5-S1. Vertebrae: Vertebral body heights maintained without evidence for acute or chronic fracture. Prominent reactive endplate changes present about the L1-2 and L4-5 interspaces. Bone marrow  signal intensity normal. No discrete or worrisome osseous lesions. Conus medullaris: Mildly low lying extending to the L2-3  level. Distal spinal cord and conus are normal in appearance. Paraspinal and other soft tissues: Paraspinous soft tissues demonstrate no acute abnormality. Chronic fatty atrophy noted within the lower paraspinous musculature. Visualized visceral structures within normal limits. Disc levels: L1-2: Diffuse disc bulge with disc desiccation and intervertebral disc space narrowing. Disc bulging eccentric to the right with associated right far lateral reactive endplate changes. Mild right lateral recess narrowing without significant canal stenosis. Foramina are patent. L2-3: Chronic intervertebral disc space narrowing with diffuse disc bulge and reactive endplate changes. Moderate facet hypertrophy. Resultant mild canal with moderate bilateral lateral recess narrowing. Moderate bilateral L2 foraminal stenosis. L3-4: Diffuse disc bulge with intervertebral disc space narrowing. Moderate facet arthrosis with ligamentum flavum hypertrophy. Resultant moderate to severe canal and bilateral subarticular stenosis. Thecal sac measures 8 mm in AP diameter. Moderate bilateral L3 foraminal stenosis, slightly worse on the right. L4-5: Chronic intervertebral disc space narrowing with diffuse disc bulge. Moderate to advanced facet arthrosis with ligamentum flavum hypertrophy. Severe canal and bilateral subarticular stenosis. Thecal sac measures approximately 4-5 mm in AP diameter. Moderate bilateral L4 foraminal stenosis. L5-S1: Diffuse disc bulge with chronic intervertebral disc space narrowing. Superimposed small central disc protrusion indents the ventral thecal sac. Mild facet hypertrophy. No significant canal stenosis. Mild right L5 foraminal narrowing. IMPRESSION: MRI HEAD SPINE IMPRESSION: 1. No acute intracranial process identified. 2. Age-related cerebral atrophy with mild chronic small vessel ischemic disease. 3. Chronic right sphenoid sinusitis. MRI CERVICAL SPINE IMPRESSION: 1. Severe cervical spondylolysis with resultant  severe diffuse canal stenosis, most severe at C3-4, C5-6, and C6-7. 2. Question faint focus of myelomalacia within the cervical spinal cord at the level of C6 as above. 3. Multifactorial degenerative changes with resultant severe multilevel foraminal narrowing as above, severe bilaterally at C3-4 through C6-7. 4. Right greater than left facet arthritis with associated reactive edema as above. Finding could serve as a source for neck pain. MRI THORACIC SPINE IMPRESSION: 1. Multilevel noncompressive disc bulging throughout the thoracic spine without significant canal stenosis. Superimposed small disc protrusions at T5-6 and T9-10 as above. 2. Multilevel facet arthropathy within the lower thoracic spine, most prevalent at T11-12. 3. Prominent discogenic reactive endplate changes at C6-7. MRI LUMBAR SPINE IMPRESSION: 1. Multilevel degenerative disc disease and facet arthrosis with resulting canal stenosis at L2-3 through L4-5. Changes most pronounced at the L4-5 level were there is severe spinal stenosis. 2. Multifactorial degenerative changes with resultant moderate multilevel foraminal narrowing at L2-3 through L4-5 bilaterally. Electronically Signed   By: Rise Mu M.D.   On: 05/21/2017 22:44   Mr Cervical Spine Wo Contrast  Result Date: 05/21/2017 CLINICAL DATA:  Initial evaluation for acute right upper and lower extremity weakness. EXAM: MRI HEAD WITHOUT CONTRAST MRI CERVICAL, THORACIC AND LUMBAR SPINE WITHOUT CONTRAST TECHNIQUE: Multiplanar and multiecho pulse sequences of the cervical spine, to include the craniocervical junction and cervicothoracic junction, and thoracic and lumbar spine, were obtained without intravenous contrast. COMPARISON:  Prior radiograph from 09/16/2015. FINDINGS: MRI HEAD FINDINGS Generalized age related cerebral atrophy. Mild chronic microvascular ischemic disease. No evidence for acute or subacute ischemic infarct. Gray-white matter differentiation maintained. No  encephalomalacia to suggest chronic infarction. No foci of susceptibility artifact to suggest acute or chronic intracranial hemorrhage. No mass lesion, midline shift or mass effect. No hydrocephalus. No extra-axial fluid collection. Major dural sinuses are grossly patent. Pituitary gland within normal limits. Midline  structures intact and normal. Major intracranial vascular flow voids are maintained. Right vertebral artery dominant with hypoplastic left vertebral artery. Degenerative thickening of the tectorial membrane with secondary narrowing at the craniocervical junction. Bone marrow signal intensity within normal limits. No scalp soft tissue abnormality. Globes and oval soft tissues within normal limits. Patient status post lens extraction bilaterally. Mild scattered mucosal thickening within the ethmoidal air cells. Chronic right sphenoid sinusitis noted. Paranasal sinuses otherwise clear. Small bilateral mastoid effusions, slightly larger on the left, of doubtful significance. Inner ear structures normal. MRI CERVICAL SPINE FINDINGS Alignment: Straightening of the normal cervical lordosis. Trace anterolisthesis of C3 on C4. 3 mm anterolisthesis of C7 on T1. Vertebrae: Vertebral body heights maintained without evidence for acute or chronic fracture. Prominent reactive endplate changes present about the C5-6 and C6-7 interspaces. C4 and C5 vertebral bodies partially ankylosed due to chronic degenerative height loss at the C4-5 disc. Bone marrow signal intensity within normal limits. No discrete or worrisome osseous lesions. Reactive edema about the left C4-5 facet as well as the right C2-3 through C5-6 facets due to facet arthritis, most prevalent at C5-6 on the right (series 15, image 2). Cord: Possible tiny focus of T2 signal abnormality within the cervical spinal cord at the level of C6, which may reflect a small focus of myelomalacia (series 12, image 9). Signal intensity within the cervical spinal cord  otherwise within normal limits. Cord is somewhat atrophic in appearance due to severe multifocal stenosis. Posterior Fossa, vertebral arteries, paraspinal tissues: Degenerative changes seen about the C1-2 articulation with thickening of the tectorial membrane. Superimposed 4 mm synovial cyst noted (series 12, image 9). Secondary mild narrowing at the craniocervical junction. Paraspinous soft tissues within normal limits. Normal intravascular flow voids within the vertebral arteries grossly maintained. Disc levels: C2-C3: Intervertebral disc space narrowing with disc desiccation. Bilateral uncovertebral hypertrophy with facet degeneration, greater on the left. Mild spinal stenosis. Moderate left with mild right C3 foraminal narrowing. C3-C4: Diffuse disc bulge with intervertebral disc space narrowing. Facet ligamentum flavum hypertrophy. Severe spinal stenosis with compression of the spinal cord. Thecal sac measures 4 mm in AP diameter. Severe bilateral C4 foraminal stenosis. C4-C5: Chronic disc space height loss with partial ankylosis of the C4 and C5 vertebral bodies. Broad central disc osteophyte indents the ventral thecal sac and impinges upon the ventral spinal cord. Moderate spinal stenosis. Severe bilateral C5 foraminal stenosis. C5-C6: Chronic diffuse degenerative disc osteophyte with intervertebral disc space narrowing. Facet ligamentum flavum hypertrophy. Severe canal stenosis with compression of the spinal cord. Thecal sac approximately 4 mm in AP diameter. Severe C6 foraminal stenosis. C6-C7: Chronic diffuse degenerative disc osteophyte and facet hypertrophy with resultant severe spinal stenosis. Impression of the cervical spinal cord, similar to additional levels. Severe bilateral C7 foraminal stenosis. C7-T1: Anterolisthesis. Diffuse disc bulge. Advanced facet arthropathy bilaterally. Mild spinal stenosis. Moderate bilateral C8 foraminal narrowing. MRI THORACIC SPINE FINDINGS Alignment: Mild  dextroscoliosis. Vertebral bodies otherwise normally aligned with preservation of the normal thoracic kyphosis. Trace anterolisthesis of T1 on T2 noted. Vertebrae: Vertebral body heights maintained without evidence for acute or chronic fracture. Prominent reactive endplate changes noted about the C6-7 interspace. Degenerative disc space height loss with partial ankylosis present at T7-8 anteriorly. Bone marrow signal intensity within normal limits. No worrisome osseous lesions. Cord: Signal intensity within the thoracic spinal cord is normal. The Paraspinal and other soft tissues: Paraspinous soft tissues within normal limits. Partially visualized lungs are grossly clear. Atherosclerotic change noted within the aorta. Visualized  visceral structures within normal limits. Disc levels: Multilevel noncompressive disc bulging seen within the thoracic spine at nearly all levels. Note made of a right paracentral disc protrusion at T5-6 flattening the right hemi cord without cord signal changes (series 24, image 14). Left paracentral disc protrusion at T9-10 indents the left ventral thecal sac without stenosis or cord deformity (series 24, image 26). Multilevel facet arthropathy present within the lower thoracic spine, most notable at T11-12. No significant spinal stenosis. Mild to moderate bilateral foraminal narrowing present at T2-3 and T10-11. No other significant foraminal encroachment. MRI LUMBAR SPINE FINDINGS Segmentation:  Normal. Alignment: Straightening with reversal of the normal lumbar lordosis. Chronic mild retrolisthesis of L1 on L2 through L5-S1. Vertebrae: Vertebral body heights maintained without evidence for acute or chronic fracture. Prominent reactive endplate changes present about the L1-2 and L4-5 interspaces. Bone marrow signal intensity normal. No discrete or worrisome osseous lesions. Conus medullaris: Mildly low lying extending to the L2-3 level. Distal spinal cord and conus are normal in  appearance. Paraspinal and other soft tissues: Paraspinous soft tissues demonstrate no acute abnormality. Chronic fatty atrophy noted within the lower paraspinous musculature. Visualized visceral structures within normal limits. Disc levels: L1-2: Diffuse disc bulge with disc desiccation and intervertebral disc space narrowing. Disc bulging eccentric to the right with associated right far lateral reactive endplate changes. Mild right lateral recess narrowing without significant canal stenosis. Foramina are patent. L2-3: Chronic intervertebral disc space narrowing with diffuse disc bulge and reactive endplate changes. Moderate facet hypertrophy. Resultant mild canal with moderate bilateral lateral recess narrowing. Moderate bilateral L2 foraminal stenosis. L3-4: Diffuse disc bulge with intervertebral disc space narrowing. Moderate facet arthrosis with ligamentum flavum hypertrophy. Resultant moderate to severe canal and bilateral subarticular stenosis. Thecal sac measures 8 mm in AP diameter. Moderate bilateral L3 foraminal stenosis, slightly worse on the right. L4-5: Chronic intervertebral disc space narrowing with diffuse disc bulge. Moderate to advanced facet arthrosis with ligamentum flavum hypertrophy. Severe canal and bilateral subarticular stenosis. Thecal sac measures approximately 4-5 mm in AP diameter. Moderate bilateral L4 foraminal stenosis. L5-S1: Diffuse disc bulge with chronic intervertebral disc space narrowing. Superimposed small central disc protrusion indents the ventral thecal sac. Mild facet hypertrophy. No significant canal stenosis. Mild right L5 foraminal narrowing. IMPRESSION: MRI HEAD SPINE IMPRESSION: 1. No acute intracranial process identified. 2. Age-related cerebral atrophy with mild chronic small vessel ischemic disease. 3. Chronic right sphenoid sinusitis. MRI CERVICAL SPINE IMPRESSION: 1. Severe cervical spondylolysis with resultant severe diffuse canal stenosis, most severe at C3-4,  C5-6, and C6-7. 2. Question faint focus of myelomalacia within the cervical spinal cord at the level of C6 as above. 3. Multifactorial degenerative changes with resultant severe multilevel foraminal narrowing as above, severe bilaterally at C3-4 through C6-7. 4. Right greater than left facet arthritis with associated reactive edema as above. Finding could serve as a source for neck pain. MRI THORACIC SPINE IMPRESSION: 1. Multilevel noncompressive disc bulging throughout the thoracic spine without significant canal stenosis. Superimposed small disc protrusions at T5-6 and T9-10 as above. 2. Multilevel facet arthropathy within the lower thoracic spine, most prevalent at T11-12. 3. Prominent discogenic reactive endplate changes at C6-7. MRI LUMBAR SPINE IMPRESSION: 1. Multilevel degenerative disc disease and facet arthrosis with resulting canal stenosis at L2-3 through L4-5. Changes most pronounced at the L4-5 level were there is severe spinal stenosis. 2. Multifactorial degenerative changes with resultant moderate multilevel foraminal narrowing at L2-3 through L4-5 bilaterally. Electronically Signed   By: Janell Quiet.D.  On: 05/21/2017 22:44   Mr Thoracic Spine Wo Contrast  Result Date: 05/21/2017 CLINICAL DATA:  Initial evaluation for acute right upper and lower extremity weakness. EXAM: MRI HEAD WITHOUT CONTRAST MRI CERVICAL, THORACIC AND LUMBAR SPINE WITHOUT CONTRAST TECHNIQUE: Multiplanar and multiecho pulse sequences of the cervical spine, to include the craniocervical junction and cervicothoracic junction, and thoracic and lumbar spine, were obtained without intravenous contrast. COMPARISON:  Prior radiograph from 09/16/2015. FINDINGS: MRI HEAD FINDINGS Generalized age related cerebral atrophy. Mild chronic microvascular ischemic disease. No evidence for acute or subacute ischemic infarct. Gray-white matter differentiation maintained. No encephalomalacia to suggest chronic infarction. No foci of  susceptibility artifact to suggest acute or chronic intracranial hemorrhage. No mass lesion, midline shift or mass effect. No hydrocephalus. No extra-axial fluid collection. Major dural sinuses are grossly patent. Pituitary gland within normal limits. Midline structures intact and normal. Major intracranial vascular flow voids are maintained. Right vertebral artery dominant with hypoplastic left vertebral artery. Degenerative thickening of the tectorial membrane with secondary narrowing at the craniocervical junction. Bone marrow signal intensity within normal limits. No scalp soft tissue abnormality. Globes and oval soft tissues within normal limits. Patient status post lens extraction bilaterally. Mild scattered mucosal thickening within the ethmoidal air cells. Chronic right sphenoid sinusitis noted. Paranasal sinuses otherwise clear. Small bilateral mastoid effusions, slightly larger on the left, of doubtful significance. Inner ear structures normal. MRI CERVICAL SPINE FINDINGS Alignment: Straightening of the normal cervical lordosis. Trace anterolisthesis of C3 on C4. 3 mm anterolisthesis of C7 on T1. Vertebrae: Vertebral body heights maintained without evidence for acute or chronic fracture. Prominent reactive endplate changes present about the C5-6 and C6-7 interspaces. C4 and C5 vertebral bodies partially ankylosed due to chronic degenerative height loss at the C4-5 disc. Bone marrow signal intensity within normal limits. No discrete or worrisome osseous lesions. Reactive edema about the left C4-5 facet as well as the right C2-3 through C5-6 facets due to facet arthritis, most prevalent at C5-6 on the right (series 15, image 2). Cord: Possible tiny focus of T2 signal abnormality within the cervical spinal cord at the level of C6, which may reflect a small focus of myelomalacia (series 12, image 9). Signal intensity within the cervical spinal cord otherwise within normal limits. Cord is somewhat atrophic in  appearance due to severe multifocal stenosis. Posterior Fossa, vertebral arteries, paraspinal tissues: Degenerative changes seen about the C1-2 articulation with thickening of the tectorial membrane. Superimposed 4 mm synovial cyst noted (series 12, image 9). Secondary mild narrowing at the craniocervical junction. Paraspinous soft tissues within normal limits. Normal intravascular flow voids within the vertebral arteries grossly maintained. Disc levels: C2-C3: Intervertebral disc space narrowing with disc desiccation. Bilateral uncovertebral hypertrophy with facet degeneration, greater on the left. Mild spinal stenosis. Moderate left with mild right C3 foraminal narrowing. C3-C4: Diffuse disc bulge with intervertebral disc space narrowing. Facet ligamentum flavum hypertrophy. Severe spinal stenosis with compression of the spinal cord. Thecal sac measures 4 mm in AP diameter. Severe bilateral C4 foraminal stenosis. C4-C5: Chronic disc space height loss with partial ankylosis of the C4 and C5 vertebral bodies. Broad central disc osteophyte indents the ventral thecal sac and impinges upon the ventral spinal cord. Moderate spinal stenosis. Severe bilateral C5 foraminal stenosis. C5-C6: Chronic diffuse degenerative disc osteophyte with intervertebral disc space narrowing. Facet ligamentum flavum hypertrophy. Severe canal stenosis with compression of the spinal cord. Thecal sac approximately 4 mm in AP diameter. Severe C6 foraminal stenosis. C6-C7: Chronic diffuse degenerative disc osteophyte and  facet hypertrophy with resultant severe spinal stenosis. Impression of the cervical spinal cord, similar to additional levels. Severe bilateral C7 foraminal stenosis. C7-T1: Anterolisthesis. Diffuse disc bulge. Advanced facet arthropathy bilaterally. Mild spinal stenosis. Moderate bilateral C8 foraminal narrowing. MRI THORACIC SPINE FINDINGS Alignment: Mild dextroscoliosis. Vertebral bodies otherwise normally aligned with  preservation of the normal thoracic kyphosis. Trace anterolisthesis of T1 on T2 noted. Vertebrae: Vertebral body heights maintained without evidence for acute or chronic fracture. Prominent reactive endplate changes noted about the C6-7 interspace. Degenerative disc space height loss with partial ankylosis present at T7-8 anteriorly. Bone marrow signal intensity within normal limits. No worrisome osseous lesions. Cord: Signal intensity within the thoracic spinal cord is normal. The Paraspinal and other soft tissues: Paraspinous soft tissues within normal limits. Partially visualized lungs are grossly clear. Atherosclerotic change noted within the aorta. Visualized visceral structures within normal limits. Disc levels: Multilevel noncompressive disc bulging seen within the thoracic spine at nearly all levels. Note made of a right paracentral disc protrusion at T5-6 flattening the right hemi cord without cord signal changes (series 24, image 14). Left paracentral disc protrusion at T9-10 indents the left ventral thecal sac without stenosis or cord deformity (series 24, image 26). Multilevel facet arthropathy present within the lower thoracic spine, most notable at T11-12. No significant spinal stenosis. Mild to moderate bilateral foraminal narrowing present at T2-3 and T10-11. No other significant foraminal encroachment. MRI LUMBAR SPINE FINDINGS Segmentation:  Normal. Alignment: Straightening with reversal of the normal lumbar lordosis. Chronic mild retrolisthesis of L1 on L2 through L5-S1. Vertebrae: Vertebral body heights maintained without evidence for acute or chronic fracture. Prominent reactive endplate changes present about the L1-2 and L4-5 interspaces. Bone marrow signal intensity normal. No discrete or worrisome osseous lesions. Conus medullaris: Mildly low lying extending to the L2-3 level. Distal spinal cord and conus are normal in appearance. Paraspinal and other soft tissues: Paraspinous soft tissues  demonstrate no acute abnormality. Chronic fatty atrophy noted within the lower paraspinous musculature. Visualized visceral structures within normal limits. Disc levels: L1-2: Diffuse disc bulge with disc desiccation and intervertebral disc space narrowing. Disc bulging eccentric to the right with associated right far lateral reactive endplate changes. Mild right lateral recess narrowing without significant canal stenosis. Foramina are patent. L2-3: Chronic intervertebral disc space narrowing with diffuse disc bulge and reactive endplate changes. Moderate facet hypertrophy. Resultant mild canal with moderate bilateral lateral recess narrowing. Moderate bilateral L2 foraminal stenosis. L3-4: Diffuse disc bulge with intervertebral disc space narrowing. Moderate facet arthrosis with ligamentum flavum hypertrophy. Resultant moderate to severe canal and bilateral subarticular stenosis. Thecal sac measures 8 mm in AP diameter. Moderate bilateral L3 foraminal stenosis, slightly worse on the right. L4-5: Chronic intervertebral disc space narrowing with diffuse disc bulge. Moderate to advanced facet arthrosis with ligamentum flavum hypertrophy. Severe canal and bilateral subarticular stenosis. Thecal sac measures approximately 4-5 mm in AP diameter. Moderate bilateral L4 foraminal stenosis. L5-S1: Diffuse disc bulge with chronic intervertebral disc space narrowing. Superimposed small central disc protrusion indents the ventral thecal sac. Mild facet hypertrophy. No significant canal stenosis. Mild right L5 foraminal narrowing. IMPRESSION: MRI HEAD SPINE IMPRESSION: 1. No acute intracranial process identified. 2. Age-related cerebral atrophy with mild chronic small vessel ischemic disease. 3. Chronic right sphenoid sinusitis. MRI CERVICAL SPINE IMPRESSION: 1. 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 2. Question faint focus of myelomalacia within the  cervical spinal cord at the level of C6 as above.  3. Multifactorial degenerative changes with resultant severe multilevel foraminal narrowing as above, severe bilaterally at C3-4 through C6-7. 4. Right greater than left facet arthritis with associated reactive edema as above. Finding could serve as a source for neck pain. MRI THORACIC SPINE IMPRESSION: 1. Multilevel noncompressive disc bulging throughout the thoracic spine without significant canal stenosis. Superimposed small disc protrusions at T5-6 and T9-10 as above. 2. Multilevel facet arthropathy within the lower thoracic spine, most prevalent at T11-12. 3. Prominent discogenic reactive endplate changes at C6-7. MRI LUMBAR SPINE IMPRESSION: 1. Multilevel degenerative disc disease and facet arthrosis with resulting canal stenosis at L2-3 through L4-5. Changes most pronounced at the L4-5 level were there is severe spinal stenosis. 2. Multifactorial degenerative changes with resultant moderate multilevel foraminal narrowing at L2-3 through L4-5 bilaterally. Electronically Signed   By: Rise Mu M.D.   On: 05/21/2017 22:44   Mr Lumbar Spine Wo Contrast  Result Date: 05/21/2017 CLINICAL DATA:  Initial evaluation for acute right upper and lower extremity weakness. EXAM: MRI HEAD WITHOUT CONTRAST MRI CERVICAL, THORACIC AND LUMBAR SPINE WITHOUT CONTRAST TECHNIQUE: Multiplanar and multiecho pulse sequences of the cervical spine, to include the craniocervical junction and cervicothoracic junction, and thoracic and lumbar spine, were obtained without intravenous contrast. COMPARISON:  Prior radiograph from 09/16/2015. FINDINGS: MRI HEAD FINDINGS Generalized age related cerebral atrophy. Mild chronic microvascular ischemic disease. No evidence for acute or subacute ischemic infarct. Gray-white matter differentiation maintained. No encephalomalacia to suggest chronic infarction. No foci of susceptibility artifact to suggest acute or chronic intracranial  hemorrhage. No mass lesion, midline shift or mass effect. No hydrocephalus. No extra-axial fluid collection. Major dural sinuses are grossly patent. Pituitary gland within normal limits. Midline structures intact and normal. Major intracranial vascular flow voids are maintained. Right vertebral artery dominant with hypoplastic left vertebral artery. Degenerative thickening of the tectorial membrane with secondary narrowing at the craniocervical junction. Bone marrow signal intensity within normal limits. No scalp soft tissue abnormality. Globes and oval soft tissues within normal limits. Patient status post lens extraction bilaterally. Mild scattered mucosal thickening within the ethmoidal air cells. Chronic right sphenoid sinusitis noted. Paranasal sinuses otherwise clear. Small bilateral mastoid effusions, slightly larger on the left, of doubtful significance. Inner ear structures normal. MRI CERVICAL SPINE FINDINGS Alignment: Straightening of the normal cervical lordosis. Trace anterolisthesis of C3 on C4. 3 mm anterolisthesis of C7 on T1. Vertebrae: Vertebral body heights maintained without evidence for acute or chronic fracture. Prominent reactive endplate changes present about the C5-6 and C6-7 interspaces. C4 and C5 vertebral bodies partially ankylosed due to chronic degenerative height loss at the C4-5 disc. Bone marrow signal intensity within normal limits. No discrete or worrisome osseous lesions. Reactive edema about the left C4-5 facet as well as the right C2-3 through C5-6 facets due to facet arthritis, most prevalent at C5-6 on the right (series 15, image 2). Cord: Possible tiny focus of T2 signal abnormality within the cervical spinal cord at the level of C6, which may reflect a small focus of myelomalacia (series 12, image 9). Signal intensity within the cervical spinal cord otherwise within normal limits. Cord is somewhat atrophic in appearance due to severe multifocal stenosis. Posterior Fossa,  vertebral arteries, paraspinal tissues: Degenerative changes seen about the C1-2 articulation with thickening of the tectorial membrane. Superimposed 4 mm synovial cyst noted (series 12, image 9). Secondary mild narrowing at the craniocervical junction. Paraspinous soft tissues within normal limits. Normal intravascular flow voids within the vertebral arteries grossly maintained. Disc levels:  C2-C3: Intervertebral disc space narrowing with disc desiccation. Bilateral uncovertebral hypertrophy with facet degeneration, greater on the left. Mild spinal stenosis. Moderate left with mild right C3 foraminal narrowing. C3-C4: Diffuse disc bulge with intervertebral disc space narrowing. Facet ligamentum flavum hypertrophy. Severe spinal stenosis with compression of the spinal cord. Thecal sac measures 4 mm in AP diameter. Severe bilateral C4 foraminal stenosis. C4-C5: Chronic disc space height loss with partial ankylosis of the C4 and C5 vertebral bodies. Broad central disc osteophyte indents the ventral thecal sac and impinges upon the ventral spinal cord. Moderate spinal stenosis. Severe bilateral C5 foraminal stenosis. C5-C6: Chronic diffuse degenerative disc osteophyte with intervertebral disc space narrowing. Facet ligamentum flavum hypertrophy. Severe canal stenosis with compression of the spinal cord. Thecal sac approximately 4 mm in AP diameter. Severe C6 foraminal stenosis. C6-C7: Chronic diffuse degenerative disc osteophyte and facet hypertrophy with resultant severe spinal stenosis. Impression of the cervical spinal cord, similar to additional levels. Severe bilateral C7 foraminal stenosis. C7-T1: Anterolisthesis. Diffuse disc bulge. Advanced facet arthropathy bilaterally. Mild spinal stenosis. Moderate bilateral C8 foraminal narrowing. MRI THORACIC SPINE FINDINGS Alignment: Mild dextroscoliosis. Vertebral bodies otherwise normally aligned with preservation of the normal thoracic kyphosis. Trace anterolisthesis  of T1 on T2 noted. Vertebrae: Vertebral body heights maintained without evidence for acute or chronic fracture. Prominent reactive endplate changes noted about the C6-7 interspace. Degenerative disc space height loss with partial ankylosis present at T7-8 anteriorly. Bone marrow signal intensity within normal limits. No worrisome osseous lesions. Cord: Signal intensity within the thoracic spinal cord is normal. The Paraspinal and other soft tissues: Paraspinous soft tissues within normal limits. Partially visualized lungs are grossly clear. Atherosclerotic change noted within the aorta. Visualized visceral structures within normal limits. Disc levels: Multilevel noncompressive disc bulging seen within the thoracic spine at nearly all levels. Note made of a right paracentral disc protrusion at T5-6 flattening the right hemi cord without cord signal changes (series 24, image 14). Left paracentral disc protrusion at T9-10 indents the left ventral thecal sac without stenosis or cord deformity (series 24, image 26). Multilevel facet arthropathy present within the lower thoracic spine, most notable at T11-12. No significant spinal stenosis. Mild to moderate bilateral foraminal narrowing present at T2-3 and T10-11. No other significant foraminal encroachment. MRI LUMBAR SPINE FINDINGS Segmentation:  Normal. Alignment: Straightening with reversal of the normal lumbar lordosis. Chronic mild retrolisthesis of L1 on L2 through L5-S1. Vertebrae: Vertebral body heights maintained without evidence for acute or chronic fracture. Prominent reactive endplate changes present about the L1-2 and L4-5 interspaces. Bone marrow signal intensity normal. No discrete or worrisome osseous lesions. Conus medullaris: Mildly low lying extending to the L2-3 level. Distal spinal cord and conus are normal in appearance. Paraspinal and other soft tissues: Paraspinous soft tissues demonstrate no acute abnormality. Chronic fatty atrophy noted within  the lower paraspinous musculature. Visualized visceral structures within normal limits. Disc levels: L1-2: Diffuse disc bulge with disc desiccation and intervertebral disc space narrowing. Disc bulging eccentric to the right with associated right far lateral reactive endplate changes. Mild right lateral recess narrowing without significant canal stenosis. Foramina are patent. L2-3: Chronic intervertebral disc space narrowing with diffuse disc bulge and reactive endplate changes. Moderate facet hypertrophy. Resultant mild canal with moderate bilateral lateral recess narrowing. Moderate bilateral L2 foraminal stenosis. L3-4: Diffuse disc bulge with intervertebral disc space narrowing. Moderate facet arthrosis with ligamentum flavum hypertrophy. Resultant moderate to severe canal and bilateral subarticular stenosis. Thecal sac measures 8 mm in AP diameter. Moderate  bilateral L3 foraminal stenosis, slightly worse on the right. L4-5: Chronic intervertebral disc space narrowing with diffuse disc bulge. Moderate to advanced facet arthrosis with ligamentum flavum hypertrophy. Severe canal and bilateral subarticular stenosis. Thecal sac measures approximately 4-5 mm in AP diameter. Moderate bilateral L4 foraminal stenosis. L5-S1: Diffuse disc bulge with chronic intervertebral disc space narrowing. Superimposed small central disc protrusion indents the ventral thecal sac. Mild facet hypertrophy. No significant canal stenosis. Mild right L5 foraminal narrowing. IMPRESSION: MRI HEAD SPINE IMPRESSION: 1. No acute intracranial process identified. 2. Age-related cerebral atrophy with mild chronic small vessel ischemic disease. 3. Chronic right sphenoid sinusitis. MRI CERVICAL SPINE IMPRESSION: 1. 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 2. Question faint focus of myelomalacia within the cervical spinal cord at the level of C6 as above. 3. Multifactorial  degenerative changes with resultant severe multilevel foraminal narrowing as above, severe bilaterally at C3-4 through C6-7. 4. Right greater than left facet arthritis with associated reactive edema as above. Finding could serve as a source for neck pain. MRI THORACIC SPINE IMPRESSION: 1. Multilevel noncompressive disc bulging throughout the thoracic spine without significant canal stenosis. Superimposed small disc protrusions at T5-6 and T9-10 as above. 2. Multilevel facet arthropathy within the lower thoracic spine, most prevalent at T11-12. 3. Prominent discogenic reactive endplate changes at C6-7. MRI LUMBAR SPINE IMPRESSION: 1. Multilevel degenerative disc disease and facet arthrosis with resulting canal stenosis at L2-3 through L4-5. Changes most pronounced at the L4-5 level were there is severe spinal stenosis. 2. Multifactorial degenerative changes with resultant moderate multilevel foraminal narrowing at L2-3 through L4-5 bilaterally. Electronically Signed   By: Rise MuBenjamin  McClintock M.D.   On: 05/21/2017 22:44      Scheduled Meds: . amiodarone  200 mg Oral QHS  . amLODipine  2.5 mg Oral QPC breakfast  . bisacodyl  10 mg Rectal Daily  . loratadine  10 mg Oral Daily  . metoprolol succinate  50 mg Oral BID  . pantoprazole  40 mg Oral Daily  . rOPINIRole  1 mg Oral QHS   Continuous Infusions: . sodium chloride 50 mL/hr at 05/22/17 0321  . [START ON 05/24/2017]  ceFAZolin (ANCEF) IV       LOS: 1 day    Time spent: 30 minutes   Noralee StainJennifer Keval Nam, DO [facility] [email or web address] Password [facility] 05/23/2017, 12:33 PM

## 2017-05-23 NOTE — Progress Notes (Signed)
NEURO HOSPITALIST PROGRESS NOTE  Subjective: Patient found laying in bed in NAD. No family at bedside. States "I feel about the same". Voices no new complaints. No acute events reported overnight. States was able to ambulate with PT to the door and back this AM. States she has decided to proceed with the surgery planned by NSurg  Exam: Vitals:   05/23/17 0441 05/23/17 0912  BP: (!) 128/56 (!) 124/59  Pulse: (!) 57 (!) 56  Resp: 20   Temp: 98.3 F (36.8 C) 98.8 F (37.1 C)  SpO2: 96% 96%   HEENT-  Normocephalic, no lesions, without obvious abnormality.  Normal external eye and conjunctiva.   Cardiovascular- regular rate and rhythm, S1, S2 normal, no murmur, click, rub or gallop, pulses palpable throughout   Lungs- chest clear, no wheezing, rales Abdomen- soft, non-tender; bowel sounds normal; no masses,  no organomegaly  Neuro:  CN: Pupils are equal and round. They are symmetrically reactive from 3-->2 mm. EOMI without nystagmus. Facial sensation is intact to light touch. Face is symmetric at rest with normal strength and mobility. Hearing is intact to conversational voice. Palate elevates symmetrically and uvula is midline. Voice is normal in tone, pitch and quality. Bilateral SCM and trapezii are 5/5. Tongue is midline with normal bulk and mobility.  Motor:  RUE: 4/5 proximally with 4-/5 grip strength and intrinsic muscles of the hand. Flexion contractures of digits are noted. Decreased tone of wrist and hand.  LUE: 5/5 RLE: 2/5 proximal and distal; somewhat decreased effort LLE: 3-4/5 proximal and distal. Sensation: Intact to light touch. Severely impaired proprioception bilateral toes. + vibration  Coordination: Finger-to-nose  without dysmetria   Medications:  Scheduled: . amiodarone  200 mg Oral QHS  . amLODipine  2.5 mg Oral QPC breakfast  . bisacodyl  10 mg Rectal Daily  . loratadine  10 mg Oral Daily  . metoprolol succinate  25 mg Oral BID  . pantoprazole  40  mg Oral Daily  . rOPINIRole  1 mg Oral QHS   Continuous: . sodium chloride 50 mL/hr at 05/22/17 0321  . [START ON 05/24/2017]  ceFAZolin (ANCEF) IV     ZOX:[id number]:albuterol, ALPRAZolam, LORazepam, oxyCODONE, senna-docusate  Pertinent Labs/Diagnostics: No results for input(s): GLUCAP in the last 168 hours. Recent Labs  Lab 05/21/17 1055 05/22/17 0620 05/23/17 0345  NA 133*  --  134*  K 4.1  --  4.0  CL 101  --  103  CO2 24  --  24  GLUCOSE 111*  --  96  BUN 14  --  24*  CREATININE 1.09* 1.24* 1.30*  CALCIUM 9.0  --  8.6*   No results for input(s): AST, ALT, ALKPHOS, BILITOT, PROT, ALBUMIN in the last 168 hours. Recent Labs  Lab 05/21/17 1055 05/22/17 0620  WBC 4.1 6.0  HGB 14.5 14.1  HCT 42.2 41.6  MCV 90.9 91.2  PLT 198 209   No results for input(s): CKTOTAL, CKMB, CKMBINDEX, TROPONINI in the last 168 hours. No results for input(s): LABPROT, INR in the last 72 hours. Recent Labs    05/21/17 1354  COLORURINE STRAW*  LABSPEC 1.008  PHURINE 8.0  GLUCOSEU NEGATIVE  HGBUR NEGATIVE  BILIRUBINUR NEGATIVE  KETONESUR NEGATIVE  PROTEINUR NEGATIVE  NITRITE NEGATIVE  LEUKOCYTESUR LARGE*       Component Value Date/Time   CHOL 167 05/22/2017 0620   TRIG 73 05/22/2017 0620   HDL 81 05/22/2017 0620   CHOLHDL 2.1 05/22/2017 0620   VLDL 15 05/22/2017 [id number]  LDLCALC 71 05/22/2017 0620   Lab Results  Component Value Date   HGBA1C 5.5 05/22/2017      Component Value Date/Time   LABOPIA NONE DETECTED 05/26/2013 2034   COCAINSCRNUR NONE DETECTED 05/26/2013 2034   LABBENZ NONE DETECTED 05/26/2013 2034   AMPHETMU NONE DETECTED 05/26/2013 2034   THCU NONE DETECTED 05/26/2013 2034   LABBARB NONE DETECTED 05/26/2013 2034    No results for input(s): ETH in the last 168 hours.   IMAGING: I have personally reviewed the radiological images below and agree with the radiology interpretations.  Result Date: 05/21/2017 MRI HEAD WITHOUT CONTRAST MRI CERVICAL, THORACIC AND LUMBAR  SPINE WITHOUT CONTRAST : IMPRESSION: MRI HEAD SPINE IMPRESSION: 1. No acute intracranial process identified. 2. Age-related cerebral atrophy with mild chronic small vessel ischemic disease. 3. Chronic right sphenoid sinusitis. MRI CERVICAL SPINE IMPRESSION: 1. 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 2. Question faint focus of myelomalacia within the cervical spinal cord at the level of C6 as above. 3. Multifactorial degenerative changes with resultant severe multilevel foraminal narrowing as above, severe bilaterally at C3-4 through C6-7. 4. Right greater than left facet arthritis with associated reactive edema as above. Finding could serve as a source for neck pain. MRI THORACIC SPINE IMPRESSION: 1. Multilevel noncompressive disc bulging throughout the thoracic spine without significant canal stenosis. Superimposed small disc protrusions at T5-6 and T9-10 as above. 2. Multilevel facet arthropathy within the lower thoracic spine, most prevalent at T11-12. 3. Prominent discogenic reactive endplate changes at C6-7. MRI LUMBAR SPINE IMPRESSION: 1. Multilevel degenerative disc disease and facet arthrosis with resulting canal stenosis at L2-3 through L4-5. Changes most pronounced at the L4-5 level were there is severe spinal stenosis. 2. Multifactorial degenerative changes with resultant moderate multilevel foraminal narrowing at L2-3 through L4-5 bilaterally.    Assessment: 83 year old female with a 4 week history of worsened lower extremity weakness, right worse than left, in conjunction with progressive right arm and hand weakness.    Likely "Double crush" phenomenon - Right sided sciatica and severe cervical spinal stenosis with cauda equina compression versus impingement seen on MRI L-spine at the L4-5 level.   Chronic disc protrusions at the C3-4 through C6-7 levels, with associated impingement and/or compression of the spinal cord at  these levels.  Possible focal exiting right L5 or S1 compression as it traverses the neural foramen also a consideration.   Abnormal proprioception at toes. Most likely due to cervical cord compressions and/or a neuropathy. Dorsal column dysfunction due to B12 or copper deficiency is also a consideration.   Plan: Neuro exam stable Neurosurgery Consultation- Surgery- anterior cervical discectomy with fusion on 11/12  Continue PT/OT  AFIB with Hx of TIA Resume Xaralto and ASA for Stroke prevention in the setting of AFIB after planned surgery  B12 or copper deficiency  B12 - 874 Serum copper- results pending, Primary team to follow   NeurohospitalistsTeam to sign off, please call with any further questions or concerns. Thank you for this consultation.     Mary A Costello. ANP-C [facility] 05/23/2017, 1:22 PM   NEUROHOSPITALIST ADDENDUM Patient being planned to be taken for surgery. Neurology will sign off.   Georgiana SpinnerSushanth Aroor MD [facility] [phone]  If 7pm to 7am, please call on call as listed on AMION.

## 2017-05-23 NOTE — Progress Notes (Signed)
Patient has had a chance to consider our discussion yesterday. We once again discussed the nature of her condition. She has very severe cervical stenosis with resultant myelopathy. I've discussed options available for further care including the possibility of moving forward with a C3-4, C5-6, C6-7 anterior cervical discectomy with interbody fusion utilizing interbody peek cages, locally harvested autograft, and anterior plate instrumentation. I discussed the risks involved with surgery including but not limited to the risk of anesthesia, bleeding, infection, CSF leak, nerve root injury, spinal cord injury, fusion failure, instrumentation failure, dysphagia, dysphonia, continued pain and non-benefit. The patient has been given after he has questions. She appears to understand as does her family member. She wishes to proceed with surgery. She realizes this may not fully improve her lower back pain.

## 2017-05-23 NOTE — Plan of Care (Signed)
Patient will have cervical surgery on 05/24/17, agreeable with plan, NPO after midnight. Consent signed, denies questions/concerns at this time.

## 2017-05-23 NOTE — Consult Note (Signed)
Cardiology Consultation:   Patient ID: NASIA CANNAN; [id number]; Mar 05, 1928   Admit date: 05/21/2017 Date of Consult: 05/23/2017  Primary Care Provider: Lovenia Kim, PA-C Primary Cardiologist: Dr. Donnie Aho    Patient Profile:   CHARLE MCLAURIN is a 83 y.o. female with a hx of PAF on Xarelto and amiodarone, HTN, PVD, OA with bilateral knee replacment and CKD who is being seen today for the evaluation of surgical clearance for C3-4, C5-6, C6-7 anterior cervical discectomy with interbody fusion utilizing interbody peek cages, locally harvested autograft, and anterior plate instrumentation  at the request of Dr. Jordan Likes  No prior hx of MI. Last echo few month ago was normal per patient. Patient has chronic orthopnea and DOE. This is stable.  History of Present Illness:   Ms. Stoney Presented with 2 days of progressive worsening of RUE and RLE numbness and weakness. Also had left facial droop. Stroke was initially suspected but MRI brain was negative for acute infarction. Work up revealed severe cervical disk stenosis with cord compression. Seen by Dr. Jordan Likes who recommended surgical options.   She and her family actually describe 3-4 weeks of progressive exercise intolerance.  Prior to that she is able to climb stairs.  She does garden and grocery shopping by herself she has chronic mild intermittent edema. She denies chest pain.  Mild stable. Stable DOE and orthopnea for many years.   No syncope or melena. Last dose of Xarelto AM of 11/9.   EKG:  The EKG was personally reviewed and demonstrates:  Sinus bradycardia with prolonged PR interval Telemetry:  Telemetry was personally reviewed and demonstrates:  Sinus rhythm at rate mostly of 50s   Past Medical History:  Diagnosis Date  . Arthritis   . Chronic kidney disease   . Depression   . Dysrhythmia 8/13   palpitations/ now resolved  . GERD (gastroesophageal reflux disease)   . History of blood transfusion 2012  . Hypertension   .  Peripheral vascular disease (HCC)    varicose veins bilaterally   . Restless legs syndrome     Past Surgical History:  Procedure Laterality Date  . ABDOMINAL HYSTERECTOMY    . APPENDECTOMY    . EYE SURGERY Bilateral    cataract extraction with IOL  . JOINT REPLACEMENT Right    knee  . SEPTOPLASTY     repair deviated septum  . VARICOSE VEIN SURGERY Bilateral     Inpatient Medications: Scheduled Meds: . amiodarone  200 mg Oral QHS  . amLODipine  2.5 mg Oral QPC breakfast  . bisacodyl  10 mg Rectal Daily  . loratadine  10 mg Oral Daily  . metoprolol succinate  50 mg Oral BID  . pantoprazole  40 mg Oral Daily  . rOPINIRole  1 mg Oral QHS   Continuous Infusions: . sodium chloride 50 mL/hr at 05/22/17 0321  . [START ON 05/24/2017]  ceFAZolin (ANCEF) IV     PRN Meds: albuterol, ALPRAZolam, LORazepam, oxyCODONE, senna-docusate  Allergies:    Allergies  Allergen Reactions  . Hctz [Hydrochlorothiazide]     Dizziness   . Lasix [Furosemide]     fatigue   . Penicillins Hives and Itching  . Tramadol-Acetaminophen     GI upset     Social History:   Social History   Socioeconomic History  . Marital status: Widowed    Spouse name: Not on file  . Number of children: Not on file  . Years of education: Not on file  . Highest  education level: Not on file  Social Needs  . Financial resource strain: Not on file  . Food insecurity - worry: Not on file  . Food insecurity - inability: Not on file  . Transportation needs - medical: Not on file  . Transportation needs - non-medical: Not on file  Occupational History  . Not on file  Tobacco Use  . Smoking status: Never Smoker  . Smokeless tobacco: Never Used  Substance and Sexual Activity  . Alcohol use: No  . Drug use: No  . Sexual activity: Not on file  Other Topics Concern  . Not on file  Social History Narrative  . Not on file    Family History:   Family History  Problem Relation Age of Onset  . Heart attack  Father   . Stroke Father   . Hypertension Father   . COPD Mother   . Lung cancer Sister   . Uterine cancer Sister   . Colon polyps Daughter   . Colon cancer Unknown      ROS:  Please see the history of present illness.  ROS All other ROS reviewed and negative.     Physical Exam/Data:   Vitals:   05/22/17 2143 05/23/17 0158 05/23/17 0441 05/23/17 0912  BP: (!) 141/67 140/71 (!) 128/56 (!) 124/59  Pulse: (!) 56 62 (!) 57 (!) 56  Resp:  20 20   Temp:  98.2 F (36.8 C) 98.3 F (36.8 C) 98.8 F (37.1 C)  TempSrc:  Oral Oral Oral  SpO2:  97% 96% 96%  Weight:      Height:        Intake/Output Summary (Last 24 hours) at 05/23/2017 1018 Last data filed at 05/23/2017 0917 Gross per 24 hour  Intake 480 ml  Output -  Net 480 ml   Filed Weights   05/22/17 0200  Weight: 186 lb 12.8 oz (84.7 kg)   Body mass index is 29.26 kg/m.  General:  Well nourished, well developed, in no acute distress HEENT: normal Lymph: no adenopathy Neck: no JVD Endocrine:  No thryomegaly Vascular: No carotid bruits; FA pulses 2+ bilaterally without bruits  Cardiac:  normal S1, S2; RRR; 2/6 early systolic  murmur  Lungs:  clear to auscultation bilaterally, no wheezing, rhonchi or rales  Abd: soft, nontender, no hepatomegaly  Ext: no edema Musculoskeletal:  RUE and RLE weakness Skin: warm and dry  Neuro:  A& O x 3 Psych:  Normal affect     Relevant CV Studies: Echo 05/2013 Study Conclusions  - Left ventricle: The cavity size was normal. There was mild concentric hypertrophy. Systolic function was normal. Wall motion was normal; there were no regional wall motion abnormalities. Doppler parameters are consistent with abnormal left ventricular relaxation (grade 1 diastolic dysfunction). - Aortic valve: Mild regurgitation. - Left atrium: The atrium was mildly to moderately dilated. Impressions:  Laboratory Data:  Chemistry Recent Labs  Lab 05/21/17 1055 05/22/17 0620  05/23/17 0345  NA 133*  --  134*  K 4.1  --  4.0  CL 101  --  103  CO2 24  --  24  GLUCOSE 111*  --  96  BUN 14  --  24*  CREATININE 1.09* 1.24* 1.30*  CALCIUM 9.0  --  8.6*  GFRNONAA 44* 38* 36*  GFRAA 51* 44* 42*  ANIONGAP 8  --  7    No results for input(s): PROT, ALBUMIN, AST, ALT, ALKPHOS, BILITOT in the last 168 hours. Hematology Recent Labs  Lab 05/21/17 1055 05/22/17 0620  WBC 4.1 6.0  RBC 4.64 4.56  HGB 14.5 14.1  HCT 42.2 41.6  MCV 90.9 91.2  MCH 31.3 30.9  MCHC 34.4 33.9  RDW 15.3 15.4  PLT 198 209   Cardiac EnzymesNo results for input(s): TROPONINI in the last 168 hours. No results for input(s): TROPIPOC in the last 168 hours.  BNPNo results for input(s): BNP, PROBNP in the last 168 hours.  DDimer No results for input(s): DDIMER in the last 168 hours.  Radiology/Studies:  Mr Sherrin Daisy Contrast  Result Date: 05/21/2017 CLINICAL DATA:  Initial evaluation for acute right upper and lower extremity weakness. EXAM: MRI HEAD WITHOUT CONTRAST MRI CERVICAL, THORACIC AND LUMBAR SPINE WITHOUT CONTRAST TECHNIQUE: Multiplanar and multiecho pulse sequences of the cervical spine, to include the craniocervical junction and cervicothoracic junction, and thoracic and lumbar spine, were obtained without intravenous contrast. COMPARISON:  Prior radiograph from 09/16/2015. FINDINGS: MRI HEAD FINDINGS Generalized age related cerebral atrophy. Mild chronic microvascular ischemic disease. No evidence for acute or subacute ischemic infarct. Gray-white matter differentiation maintained. No encephalomalacia to suggest chronic infarction. No foci of susceptibility artifact to suggest acute or chronic intracranial hemorrhage. No mass lesion, midline shift or mass effect. No hydrocephalus. No extra-axial fluid collection. Major dural sinuses are grossly patent. Pituitary gland within normal limits. Midline structures intact and normal. Major intracranial vascular flow voids are maintained. Right  vertebral artery dominant with hypoplastic left vertebral artery. Degenerative thickening of the tectorial membrane with secondary narrowing at the craniocervical junction. Bone marrow signal intensity within normal limits. No scalp soft tissue abnormality. Globes and oval soft tissues within normal limits. Patient status post lens extraction bilaterally. Mild scattered mucosal thickening within the ethmoidal air cells. Chronic right sphenoid sinusitis noted. Paranasal sinuses otherwise clear. Small bilateral mastoid effusions, slightly larger on the left, of doubtful significance. Inner ear structures normal. MRI CERVICAL SPINE FINDINGS Alignment: Straightening of the normal cervical lordosis. Trace anterolisthesis of C3 on C4. 3 mm anterolisthesis of C7 on T1. Vertebrae: Vertebral body heights maintained without evidence for acute or chronic fracture. Prominent reactive endplate changes present about the C5-6 and C6-7 interspaces. C4 and C5 vertebral bodies partially ankylosed due to chronic degenerative height loss at the C4-5 disc. Bone marrow signal intensity within normal limits. No discrete or worrisome osseous lesions. Reactive edema about the left C4-5 facet as well as the right C2-3 through C5-6 facets due to facet arthritis, most prevalent at C5-6 on the right (series 15, image 2). Cord: Possible tiny focus of T2 signal abnormality within the cervical spinal cord at the level of C6, which may reflect a small focus of myelomalacia (series 12, image 9). Signal intensity within the cervical spinal cord otherwise within normal limits. Cord is somewhat atrophic in appearance due to severe multifocal stenosis. Posterior Fossa, vertebral arteries, paraspinal tissues: Degenerative changes seen about the C1-2 articulation with thickening of the tectorial membrane. Superimposed 4 mm synovial cyst noted (series 12, image 9). Secondary mild narrowing at the craniocervical junction. Paraspinous soft tissues within  normal limits. Normal intravascular flow voids within the vertebral arteries grossly maintained. Disc levels: C2-C3: Intervertebral disc space narrowing with disc desiccation. Bilateral uncovertebral hypertrophy with facet degeneration, greater on the left. Mild spinal stenosis. Moderate left with mild right C3 foraminal narrowing. C3-C4: Diffuse disc bulge with intervertebral disc space narrowing. Facet ligamentum flavum hypertrophy. Severe spinal stenosis with compression of the spinal cord. Thecal sac measures 4 mm in AP diameter. Severe bilateral C4  foraminal stenosis. C4-C5: Chronic disc space height loss with partial ankylosis of the C4 and C5 vertebral bodies. Broad central disc osteophyte indents the ventral thecal sac and impinges upon the ventral spinal cord. Moderate spinal stenosis. Severe bilateral C5 foraminal stenosis. C5-C6: Chronic diffuse degenerative disc osteophyte with intervertebral disc space narrowing. Facet ligamentum flavum hypertrophy. Severe canal stenosis with compression of the spinal cord. Thecal sac approximately 4 mm in AP diameter. Severe C6 foraminal stenosis. C6-C7: Chronic diffuse degenerative disc osteophyte and facet hypertrophy with resultant severe spinal stenosis. Impression of the cervical spinal cord, similar to additional levels. Severe bilateral C7 foraminal stenosis. C7-T1: Anterolisthesis. Diffuse disc bulge. Advanced facet arthropathy bilaterally. Mild spinal stenosis. Moderate bilateral C8 foraminal narrowing. MRI THORACIC SPINE FINDINGS Alignment: Mild dextroscoliosis. Vertebral bodies otherwise normally aligned with preservation of the normal thoracic kyphosis. Trace anterolisthesis of T1 on T2 noted. Vertebrae: Vertebral body heights maintained without evidence for acute or chronic fracture. Prominent reactive endplate changes noted about the C6-7 interspace. Degenerative disc space height loss with partial ankylosis present at T7-8 anteriorly. Bone marrow signal  intensity within normal limits. No worrisome osseous lesions. Cord: Signal intensity within the thoracic spinal cord is normal. The Paraspinal and other soft tissues: Paraspinous soft tissues within normal limits. Partially visualized lungs are grossly clear. Atherosclerotic change noted within the aorta. Visualized visceral structures within normal limits. Disc levels: Multilevel noncompressive disc bulging seen within the thoracic spine at nearly all levels. Note made of a right paracentral disc protrusion at T5-6 flattening the right hemi cord without cord signal changes (series 24, image 14). Left paracentral disc protrusion at T9-10 indents the left ventral thecal sac without stenosis or cord deformity (series 24, image 26). Multilevel facet arthropathy present within the lower thoracic spine, most notable at T11-12. No significant spinal stenosis. Mild to moderate bilateral foraminal narrowing present at T2-3 and T10-11. No other significant foraminal encroachment. MRI LUMBAR SPINE FINDINGS Segmentation:  Normal. Alignment: Straightening with reversal of the normal lumbar lordosis. Chronic mild retrolisthesis of L1 on L2 through L5-S1. Vertebrae: Vertebral body heights maintained without evidence for acute or chronic fracture. Prominent reactive endplate changes present about the L1-2 and L4-5 interspaces. Bone marrow signal intensity normal. No discrete or worrisome osseous lesions. Conus medullaris: Mildly low lying extending to the L2-3 level. Distal spinal cord and conus are normal in appearance. Paraspinal and other soft tissues: Paraspinous soft tissues demonstrate no acute abnormality. Chronic fatty atrophy noted within the lower paraspinous musculature. Visualized visceral structures within normal limits. Disc levels: L1-2: Diffuse disc bulge with disc desiccation and intervertebral disc space narrowing. Disc bulging eccentric to the right with associated right far lateral reactive endplate changes.  Mild right lateral recess narrowing without significant canal stenosis. Foramina are patent. L2-3: Chronic intervertebral disc space narrowing with diffuse disc bulge and reactive endplate changes. Moderate facet hypertrophy. Resultant mild canal with moderate bilateral lateral recess narrowing. Moderate bilateral L2 foraminal stenosis. L3-4: Diffuse disc bulge with intervertebral disc space narrowing. Moderate facet arthrosis with ligamentum flavum hypertrophy. Resultant moderate to severe canal and bilateral subarticular stenosis. Thecal sac measures 8 mm in AP diameter. Moderate bilateral L3 foraminal stenosis, slightly worse on the right. L4-5: Chronic intervertebral disc space narrowing with diffuse disc bulge. Moderate to advanced facet arthrosis with ligamentum flavum hypertrophy. Severe canal and bilateral subarticular stenosis. Thecal sac measures approximately 4-5 mm in AP diameter. Moderate bilateral L4 foraminal stenosis. L5-S1: Diffuse disc bulge with chronic intervertebral disc space narrowing. Superimposed small central disc  protrusion indents the ventral thecal sac. Mild facet hypertrophy. No significant canal stenosis. Mild right L5 foraminal narrowing. IMPRESSION: MRI HEAD SPINE IMPRESSION: 1. No acute intracranial process identified. 2. Age-related cerebral atrophy with mild chronic small vessel ischemic disease. 3. Chronic right sphenoid sinusitis. MRI CERVICAL SPINE IMPRESSION: 1. 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 2. Question faint focus of myelomalacia within the cervical spinal cord at the level of C6 as above. 3. Multifactorial degenerative changes with resultant severe multilevel foraminal narrowing as above, severe bilaterally at C3-4 through C6-7. 4. Right greater than left facet arthritis with associated reactive edema as above. Finding could serve as a source for neck pain. MRI THORACIC SPINE IMPRESSION: 1.  Multilevel noncompressive disc bulging throughout the thoracic spine without significant canal stenosis. Superimposed small disc protrusions at T5-6 and T9-10 as above. 2. Multilevel facet arthropathy within the lower thoracic spine, most prevalent at T11-12. 3. Prominent discogenic reactive endplate changes at C6-7. MRI LUMBAR SPINE IMPRESSION: 1. Multilevel degenerative disc disease and facet arthrosis with resulting canal stenosis at L2-3 through L4-5. Changes most pronounced at the L4-5 level were there is severe spinal stenosis. 2. Multifactorial degenerative changes with resultant moderate multilevel foraminal narrowing at L2-3 through L4-5 bilaterally. Electronically Signed   By: Rise Mu M.D.   On: 05/21/2017 22:44   Mr Cervical Spine Wo Contrast  Result Date: 05/21/2017 CLINICAL DATA:  Initial evaluation for acute right upper and lower extremity weakness. EXAM: MRI HEAD WITHOUT CONTRAST MRI CERVICAL, THORACIC AND LUMBAR SPINE WITHOUT CONTRAST TECHNIQUE: Multiplanar and multiecho pulse sequences of the cervical spine, to include the craniocervical junction and cervicothoracic junction, and thoracic and lumbar spine, were obtained without intravenous contrast. COMPARISON:  Prior radiograph from 09/16/2015. FINDINGS: MRI HEAD FINDINGS Generalized age related cerebral atrophy. Mild chronic microvascular ischemic disease. No evidence for acute or subacute ischemic infarct. Gray-white matter differentiation maintained. No encephalomalacia to suggest chronic infarction. No foci of susceptibility artifact to suggest acute or chronic intracranial hemorrhage. No mass lesion, midline shift or mass effect. No hydrocephalus. No extra-axial fluid collection. Major dural sinuses are grossly patent. Pituitary gland within normal limits. Midline structures intact and normal. Major intracranial vascular flow voids are maintained. Right vertebral artery dominant with hypoplastic left vertebral artery.  Degenerative thickening of the tectorial membrane with secondary narrowing at the craniocervical junction. Bone marrow signal intensity within normal limits. No scalp soft tissue abnormality. Globes and oval soft tissues within normal limits. Patient status post lens extraction bilaterally. Mild scattered mucosal thickening within the ethmoidal air cells. Chronic right sphenoid sinusitis noted. Paranasal sinuses otherwise clear. Small bilateral mastoid effusions, slightly larger on the left, of doubtful significance. Inner ear structures normal. MRI CERVICAL SPINE FINDINGS Alignment: Straightening of the normal cervical lordosis. Trace anterolisthesis of C3 on C4. 3 mm anterolisthesis of C7 on T1. Vertebrae: Vertebral body heights maintained without evidence for acute or chronic fracture. Prominent reactive endplate changes present about the C5-6 and C6-7 interspaces. C4 and C5 vertebral bodies partially ankylosed due to chronic degenerative height loss at the C4-5 disc. Bone marrow signal intensity within normal limits. No discrete or worrisome osseous lesions. Reactive edema about the left C4-5 facet as well as the right C2-3 through C5-6 facets due to facet arthritis, most prevalent at C5-6 on the right (series 15, image 2). Cord: Possible tiny focus of T2 signal abnormality within the cervical spinal cord at the level of C6, which may reflect a  small focus of myelomalacia (series 12, image 9). Signal intensity within the cervical spinal cord otherwise within normal limits. Cord is somewhat atrophic in appearance due to severe multifocal stenosis. Posterior Fossa, vertebral arteries, paraspinal tissues: Degenerative changes seen about the C1-2 articulation with thickening of the tectorial membrane. Superimposed 4 mm synovial cyst noted (series 12, image 9). Secondary mild narrowing at the craniocervical junction. Paraspinous soft tissues within normal limits. Normal intravascular flow voids within the vertebral  arteries grossly maintained. Disc levels: C2-C3: Intervertebral disc space narrowing with disc desiccation. Bilateral uncovertebral hypertrophy with facet degeneration, greater on the left. Mild spinal stenosis. Moderate left with mild right C3 foraminal narrowing. C3-C4: Diffuse disc bulge with intervertebral disc space narrowing. Facet ligamentum flavum hypertrophy. Severe spinal stenosis with compression of the spinal cord. Thecal sac measures 4 mm in AP diameter. Severe bilateral C4 foraminal stenosis. C4-C5: Chronic disc space height loss with partial ankylosis of the C4 and C5 vertebral bodies. Broad central disc osteophyte indents the ventral thecal sac and impinges upon the ventral spinal cord. Moderate spinal stenosis. Severe bilateral C5 foraminal stenosis. C5-C6: Chronic diffuse degenerative disc osteophyte with intervertebral disc space narrowing. Facet ligamentum flavum hypertrophy. Severe canal stenosis with compression of the spinal cord. Thecal sac approximately 4 mm in AP diameter. Severe C6 foraminal stenosis. C6-C7: Chronic diffuse degenerative disc osteophyte and facet hypertrophy with resultant severe spinal stenosis. Impression of the cervical spinal cord, similar to additional levels. Severe bilateral C7 foraminal stenosis. C7-T1: Anterolisthesis. Diffuse disc bulge. Advanced facet arthropathy bilaterally. Mild spinal stenosis. Moderate bilateral C8 foraminal narrowing. MRI THORACIC SPINE FINDINGS Alignment: Mild dextroscoliosis. Vertebral bodies otherwise normally aligned with preservation of the normal thoracic kyphosis. Trace anterolisthesis of T1 on T2 noted. Vertebrae: Vertebral body heights maintained without evidence for acute or chronic fracture. Prominent reactive endplate changes noted about the C6-7 interspace. Degenerative disc space height loss with partial ankylosis present at T7-8 anteriorly. Bone marrow signal intensity within normal limits. No worrisome osseous lesions. Cord:  Signal intensity within the thoracic spinal cord is normal. The Paraspinal and other soft tissues: Paraspinous soft tissues within normal limits. Partially visualized lungs are grossly clear. Atherosclerotic change noted within the aorta. Visualized visceral structures within normal limits. Disc levels: Multilevel noncompressive disc bulging seen within the thoracic spine at nearly all levels. Note made of a right paracentral disc protrusion at T5-6 flattening the right hemi cord without cord signal changes (series 24, image 14). Left paracentral disc protrusion at T9-10 indents the left ventral thecal sac without stenosis or cord deformity (series 24, image 26). Multilevel facet arthropathy present within the lower thoracic spine, most notable at T11-12. No significant spinal stenosis. Mild to moderate bilateral foraminal narrowing present at T2-3 and T10-11. No other significant foraminal encroachment. MRI LUMBAR SPINE FINDINGS Segmentation:  Normal. Alignment: Straightening with reversal of the normal lumbar lordosis. Chronic mild retrolisthesis of L1 on L2 through L5-S1. Vertebrae: Vertebral body heights maintained without evidence for acute or chronic fracture. Prominent reactive endplate changes present about the L1-2 and L4-5 interspaces. Bone marrow signal intensity normal. No discrete or worrisome osseous lesions. Conus medullaris: Mildly low lying extending to the L2-3 level. Distal spinal cord and conus are normal in appearance. Paraspinal and other soft tissues: Paraspinous soft tissues demonstrate no acute abnormality. Chronic fatty atrophy noted within the lower paraspinous musculature. Visualized visceral structures within normal limits. Disc levels: L1-2: Diffuse disc bulge with disc desiccation and intervertebral disc space narrowing. Disc bulging eccentric to the right with  associated right far lateral reactive endplate changes. Mild right lateral recess narrowing without significant canal  stenosis. Foramina are patent. L2-3: Chronic intervertebral disc space narrowing with diffuse disc bulge and reactive endplate changes. Moderate facet hypertrophy. Resultant mild canal with moderate bilateral lateral recess narrowing. Moderate bilateral L2 foraminal stenosis. L3-4: Diffuse disc bulge with intervertebral disc space narrowing. Moderate facet arthrosis with ligamentum flavum hypertrophy. Resultant moderate to severe canal and bilateral subarticular stenosis. Thecal sac measures 8 mm in AP diameter. Moderate bilateral L3 foraminal stenosis, slightly worse on the right. L4-5: Chronic intervertebral disc space narrowing with diffuse disc bulge. Moderate to advanced facet arthrosis with ligamentum flavum hypertrophy. Severe canal and bilateral subarticular stenosis. Thecal sac measures approximately 4-5 mm in AP diameter. Moderate bilateral L4 foraminal stenosis. L5-S1: Diffuse disc bulge with chronic intervertebral disc space narrowing. Superimposed small central disc protrusion indents the ventral thecal sac. Mild facet hypertrophy. No significant canal stenosis. Mild right L5 foraminal narrowing. IMPRESSION: MRI HEAD SPINE IMPRESSION: 1. No acute intracranial process identified. 2. Age-related cerebral atrophy with mild chronic small vessel ischemic disease. 3. Chronic right sphenoid sinusitis. MRI CERVICAL SPINE IMPRESSION: 1. 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 2. Question faint focus of myelomalacia within the cervical spinal cord at the level of C6 as above. 3. Multifactorial degenerative changes with resultant severe multilevel foraminal narrowing as above, severe bilaterally at C3-4 through C6-7. 4. Right greater than left facet arthritis with associated reactive edema as above. Finding could serve as a source for neck pain. MRI THORACIC SPINE IMPRESSION: 1. Multilevel noncompressive disc bulging throughout the thoracic  spine without significant canal stenosis. Superimposed small disc protrusions at T5-6 and T9-10 as above. 2. Multilevel facet arthropathy within the lower thoracic spine, most prevalent at T11-12. 3. Prominent discogenic reactive endplate changes at C6-7. MRI LUMBAR SPINE IMPRESSION: 1. Multilevel degenerative disc disease and facet arthrosis with resulting canal stenosis at L2-3 through L4-5. Changes most pronounced at the L4-5 level were there is severe spinal stenosis. 2. Multifactorial degenerative changes with resultant moderate multilevel foraminal narrowing at L2-3 through L4-5 bilaterally. Electronically Signed   By: Rise Mu M.D.   On: 05/21/2017 22:44   Mr Thoracic Spine Wo Contrast  Result Date: 05/21/2017 CLINICAL DATA:  Initial evaluation for acute right upper and lower extremity weakness. EXAM: MRI HEAD WITHOUT CONTRAST MRI CERVICAL, THORACIC AND LUMBAR SPINE WITHOUT CONTRAST TECHNIQUE: Multiplanar and multiecho pulse sequences of the cervical spine, to include the craniocervical junction and cervicothoracic junction, and thoracic and lumbar spine, were obtained without intravenous contrast. COMPARISON:  Prior radiograph from 09/16/2015. FINDINGS: MRI HEAD FINDINGS Generalized age related cerebral atrophy. Mild chronic microvascular ischemic disease. No evidence for acute or subacute ischemic infarct. Gray-white matter differentiation maintained. No encephalomalacia to suggest chronic infarction. No foci of susceptibility artifact to suggest acute or chronic intracranial hemorrhage. No mass lesion, midline shift or mass effect. No hydrocephalus. No extra-axial fluid collection. Major dural sinuses are grossly patent. Pituitary gland within normal limits. Midline structures intact and normal. Major intracranial vascular flow voids are maintained. Right vertebral artery dominant with hypoplastic left vertebral artery. Degenerative thickening of the tectorial membrane with secondary  narrowing at the craniocervical junction. Bone marrow signal intensity within normal limits. No scalp soft tissue abnormality. Globes and oval soft tissues within normal limits. Patient status post lens extraction bilaterally. Mild scattered mucosal thickening within the ethmoidal air cells. Chronic right sphenoid sinusitis noted. Paranasal sinuses otherwise  clear. Small bilateral mastoid effusions, slightly larger on the left, of doubtful significance. Inner ear structures normal. MRI CERVICAL SPINE FINDINGS Alignment: Straightening of the normal cervical lordosis. Trace anterolisthesis of C3 on C4. 3 mm anterolisthesis of C7 on T1. Vertebrae: Vertebral body heights maintained without evidence for acute or chronic fracture. Prominent reactive endplate changes present about the C5-6 and C6-7 interspaces. C4 and C5 vertebral bodies partially ankylosed due to chronic degenerative height loss at the C4-5 disc. Bone marrow signal intensity within normal limits. No discrete or worrisome osseous lesions. Reactive edema about the left C4-5 facet as well as the right C2-3 through C5-6 facets due to facet arthritis, most prevalent at C5-6 on the right (series 15, image 2). Cord: Possible tiny focus of T2 signal abnormality within the cervical spinal cord at the level of C6, which may reflect a small focus of myelomalacia (series 12, image 9). Signal intensity within the cervical spinal cord otherwise within normal limits. Cord is somewhat atrophic in appearance due to severe multifocal stenosis. Posterior Fossa, vertebral arteries, paraspinal tissues: Degenerative changes seen about the C1-2 articulation with thickening of the tectorial membrane. Superimposed 4 mm synovial cyst noted (series 12, image 9). Secondary mild narrowing at the craniocervical junction. Paraspinous soft tissues within normal limits. Normal intravascular flow voids within the vertebral arteries grossly maintained. Disc levels: C2-C3: Intervertebral  disc space narrowing with disc desiccation. Bilateral uncovertebral hypertrophy with facet degeneration, greater on the left. Mild spinal stenosis. Moderate left with mild right C3 foraminal narrowing. C3-C4: Diffuse disc bulge with intervertebral disc space narrowing. Facet ligamentum flavum hypertrophy. Severe spinal stenosis with compression of the spinal cord. Thecal sac measures 4 mm in AP diameter. Severe bilateral C4 foraminal stenosis. C4-C5: Chronic disc space height loss with partial ankylosis of the C4 and C5 vertebral bodies. Broad central disc osteophyte indents the ventral thecal sac and impinges upon the ventral spinal cord. Moderate spinal stenosis. Severe bilateral C5 foraminal stenosis. C5-C6: Chronic diffuse degenerative disc osteophyte with intervertebral disc space narrowing. Facet ligamentum flavum hypertrophy. Severe canal stenosis with compression of the spinal cord. Thecal sac approximately 4 mm in AP diameter. Severe C6 foraminal stenosis. C6-C7: Chronic diffuse degenerative disc osteophyte and facet hypertrophy with resultant severe spinal stenosis. Impression of the cervical spinal cord, similar to additional levels. Severe bilateral C7 foraminal stenosis. C7-T1: Anterolisthesis. Diffuse disc bulge. Advanced facet arthropathy bilaterally. Mild spinal stenosis. Moderate bilateral C8 foraminal narrowing. MRI THORACIC SPINE FINDINGS Alignment: Mild dextroscoliosis. Vertebral bodies otherwise normally aligned with preservation of the normal thoracic kyphosis. Trace anterolisthesis of T1 on T2 noted. Vertebrae: Vertebral body heights maintained without evidence for acute or chronic fracture. Prominent reactive endplate changes noted about the C6-7 interspace. Degenerative disc space height loss with partial ankylosis present at T7-8 anteriorly. Bone marrow signal intensity within normal limits. No worrisome osseous lesions. Cord: Signal intensity within the thoracic spinal cord is normal. The  Paraspinal and other soft tissues: Paraspinous soft tissues within normal limits. Partially visualized lungs are grossly clear. Atherosclerotic change noted within the aorta. Visualized visceral structures within normal limits. Disc levels: Multilevel noncompressive disc bulging seen within the thoracic spine at nearly all levels. Note made of a right paracentral disc protrusion at T5-6 flattening the right hemi cord without cord signal changes (series 24, image 14). Left paracentral disc protrusion at T9-10 indents the left ventral thecal sac without stenosis or cord deformity (series 24, image 26). Multilevel facet arthropathy present within the lower thoracic spine, most notable at  T11-12. No significant spinal stenosis. Mild to moderate bilateral foraminal narrowing present at T2-3 and T10-11. No other significant foraminal encroachment. MRI LUMBAR SPINE FINDINGS Segmentation:  Normal. Alignment: Straightening with reversal of the normal lumbar lordosis. Chronic mild retrolisthesis of L1 on L2 through L5-S1. Vertebrae: Vertebral body heights maintained without evidence for acute or chronic fracture. Prominent reactive endplate changes present about the L1-2 and L4-5 interspaces. Bone marrow signal intensity normal. No discrete or worrisome osseous lesions. Conus medullaris: Mildly low lying extending to the L2-3 level. Distal spinal cord and conus are normal in appearance. Paraspinal and other soft tissues: Paraspinous soft tissues demonstrate no acute abnormality. Chronic fatty atrophy noted within the lower paraspinous musculature. Visualized visceral structures within normal limits. Disc levels: L1-2: Diffuse disc bulge with disc desiccation and intervertebral disc space narrowing. Disc bulging eccentric to the right with associated right far lateral reactive endplate changes. Mild right lateral recess narrowing without significant canal stenosis. Foramina are patent. L2-3: Chronic intervertebral disc space  narrowing with diffuse disc bulge and reactive endplate changes. Moderate facet hypertrophy. Resultant mild canal with moderate bilateral lateral recess narrowing. Moderate bilateral L2 foraminal stenosis. L3-4: Diffuse disc bulge with intervertebral disc space narrowing. Moderate facet arthrosis with ligamentum flavum hypertrophy. Resultant moderate to severe canal and bilateral subarticular stenosis. Thecal sac measures 8 mm in AP diameter. Moderate bilateral L3 foraminal stenosis, slightly worse on the right. L4-5: Chronic intervertebral disc space narrowing with diffuse disc bulge. Moderate to advanced facet arthrosis with ligamentum flavum hypertrophy. Severe canal and bilateral subarticular stenosis. Thecal sac measures approximately 4-5 mm in AP diameter. Moderate bilateral L4 foraminal stenosis. L5-S1: Diffuse disc bulge with chronic intervertebral disc space narrowing. Superimposed small central disc protrusion indents the ventral thecal sac. Mild facet hypertrophy. No significant canal stenosis. Mild right L5 foraminal narrowing. IMPRESSION: MRI HEAD SPINE IMPRESSION: 1. No acute intracranial process identified. 2. Age-related cerebral atrophy with mild chronic small vessel ischemic disease. 3. Chronic right sphenoid sinusitis. MRI CERVICAL SPINE IMPRESSION: 1. 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 2. Question faint focus of myelomalacia within the cervical spinal cord at the level of C6 as above. 3. Multifactorial degenerative changes with resultant severe multilevel foraminal narrowing as above, severe bilaterally at C3-4 through C6-7. 4. Right greater than left facet arthritis with associated reactive edema as above. Finding could serve as a source for neck pain. MRI THORACIC SPINE IMPRESSION: 1. Multilevel noncompressive disc bulging throughout the thoracic spine without significant canal stenosis. Superimposed small disc  protrusions at T5-6 and T9-10 as above. 2. Multilevel facet arthropathy within the lower thoracic spine, most prevalent at T11-12. 3. Prominent discogenic reactive endplate changes at C6-7. MRI LUMBAR SPINE IMPRESSION: 1. Multilevel degenerative disc disease and facet arthrosis with resulting canal stenosis at L2-3 through L4-5. Changes most pronounced at the L4-5 level were there is severe spinal stenosis. 2. Multifactorial degenerative changes with resultant moderate multilevel foraminal narrowing at L2-3 through L4-5 bilaterally. Electronically Signed   By: Rise MuBenjamin  McClintock M.D.   On: 05/21/2017 22:44   Mr Lumbar Spine Wo Contrast  Result Date: 05/21/2017 CLINICAL DATA:  Initial evaluation for acute right upper and lower extremity weakness. EXAM: MRI HEAD WITHOUT CONTRAST MRI CERVICAL, THORACIC AND LUMBAR SPINE WITHOUT CONTRAST TECHNIQUE: Multiplanar and multiecho pulse sequences of the cervical spine, to include the craniocervical junction and cervicothoracic junction, and thoracic and lumbar spine, were obtained without intravenous contrast. COMPARISON:  Prior radiograph from 09/16/2015.  FINDINGS: MRI HEAD FINDINGS Generalized age related cerebral atrophy. Mild chronic microvascular ischemic disease. No evidence for acute or subacute ischemic infarct. Gray-white matter differentiation maintained. No encephalomalacia to suggest chronic infarction. No foci of susceptibility artifact to suggest acute or chronic intracranial hemorrhage. No mass lesion, midline shift or mass effect. No hydrocephalus. No extra-axial fluid collection. Major dural sinuses are grossly patent. Pituitary gland within normal limits. Midline structures intact and normal. Major intracranial vascular flow voids are maintained. Right vertebral artery dominant with hypoplastic left vertebral artery. Degenerative thickening of the tectorial membrane with secondary narrowing at the craniocervical junction. Bone marrow signal intensity  within normal limits. No scalp soft tissue abnormality. Globes and oval soft tissues within normal limits. Patient status post lens extraction bilaterally. Mild scattered mucosal thickening within the ethmoidal air cells. Chronic right sphenoid sinusitis noted. Paranasal sinuses otherwise clear. Small bilateral mastoid effusions, slightly larger on the left, of doubtful significance. Inner ear structures normal. MRI CERVICAL SPINE FINDINGS Alignment: Straightening of the normal cervical lordosis. Trace anterolisthesis of C3 on C4. 3 mm anterolisthesis of C7 on T1. Vertebrae: Vertebral body heights maintained without evidence for acute or chronic fracture. Prominent reactive endplate changes present about the C5-6 and C6-7 interspaces. C4 and C5 vertebral bodies partially ankylosed due to chronic degenerative height loss at the C4-5 disc. Bone marrow signal intensity within normal limits. No discrete or worrisome osseous lesions. Reactive edema about the left C4-5 facet as well as the right C2-3 through C5-6 facets due to facet arthritis, most prevalent at C5-6 on the right (series 15, image 2). Cord: Possible tiny focus of T2 signal abnormality within the cervical spinal cord at the level of C6, which may reflect a small focus of myelomalacia (series 12, image 9). Signal intensity within the cervical spinal cord otherwise within normal limits. Cord is somewhat atrophic in appearance due to severe multifocal stenosis. Posterior Fossa, vertebral arteries, paraspinal tissues: Degenerative changes seen about the C1-2 articulation with thickening of the tectorial membrane. Superimposed 4 mm synovial cyst noted (series 12, image 9). Secondary mild narrowing at the craniocervical junction. Paraspinous soft tissues within normal limits. Normal intravascular flow voids within the vertebral arteries grossly maintained. Disc levels: C2-C3: Intervertebral disc space narrowing with disc desiccation. Bilateral uncovertebral  hypertrophy with facet degeneration, greater on the left. Mild spinal stenosis. Moderate left with mild right C3 foraminal narrowing. C3-C4: Diffuse disc bulge with intervertebral disc space narrowing. Facet ligamentum flavum hypertrophy. Severe spinal stenosis with compression of the spinal cord. Thecal sac measures 4 mm in AP diameter. Severe bilateral C4 foraminal stenosis. C4-C5: Chronic disc space height loss with partial ankylosis of the C4 and C5 vertebral bodies. Broad central disc osteophyte indents the ventral thecal sac and impinges upon the ventral spinal cord. Moderate spinal stenosis. Severe bilateral C5 foraminal stenosis. C5-C6: Chronic diffuse degenerative disc osteophyte with intervertebral disc space narrowing. Facet ligamentum flavum hypertrophy. Severe canal stenosis with compression of the spinal cord. Thecal sac approximately 4 mm in AP diameter. Severe C6 foraminal stenosis. C6-C7: Chronic diffuse degenerative disc osteophyte and facet hypertrophy with resultant severe spinal stenosis. Impression of the cervical spinal cord, similar to additional levels. Severe bilateral C7 foraminal stenosis. C7-T1: Anterolisthesis. Diffuse disc bulge. Advanced facet arthropathy bilaterally. Mild spinal stenosis. Moderate bilateral C8 foraminal narrowing. MRI THORACIC SPINE FINDINGS Alignment: Mild dextroscoliosis. Vertebral bodies otherwise normally aligned with preservation of the normal thoracic kyphosis. Trace anterolisthesis of T1 on T2 noted. Vertebrae: Vertebral body heights maintained without evidence for acute  or chronic fracture. Prominent reactive endplate changes noted about the C6-7 interspace. Degenerative disc space height loss with partial ankylosis present at T7-8 anteriorly. Bone marrow signal intensity within normal limits. No worrisome osseous lesions. Cord: Signal intensity within the thoracic spinal cord is normal. The Paraspinal and other soft tissues: Paraspinous soft tissues within  normal limits. Partially visualized lungs are grossly clear. Atherosclerotic change noted within the aorta. Visualized visceral structures within normal limits. Disc levels: Multilevel noncompressive disc bulging seen within the thoracic spine at nearly all levels. Note made of a right paracentral disc protrusion at T5-6 flattening the right hemi cord without cord signal changes (series 24, image 14). Left paracentral disc protrusion at T9-10 indents the left ventral thecal sac without stenosis or cord deformity (series 24, image 26). Multilevel facet arthropathy present within the lower thoracic spine, most notable at T11-12. No significant spinal stenosis. Mild to moderate bilateral foraminal narrowing present at T2-3 and T10-11. No other significant foraminal encroachment. MRI LUMBAR SPINE FINDINGS Segmentation:  Normal. Alignment: Straightening with reversal of the normal lumbar lordosis. Chronic mild retrolisthesis of L1 on L2 through L5-S1. Vertebrae: Vertebral body heights maintained without evidence for acute or chronic fracture. Prominent reactive endplate changes present about the L1-2 and L4-5 interspaces. Bone marrow signal intensity normal. No discrete or worrisome osseous lesions. Conus medullaris: Mildly low lying extending to the L2-3 level. Distal spinal cord and conus are normal in appearance. Paraspinal and other soft tissues: Paraspinous soft tissues demonstrate no acute abnormality. Chronic fatty atrophy noted within the lower paraspinous musculature. Visualized visceral structures within normal limits. Disc levels: L1-2: Diffuse disc bulge with disc desiccation and intervertebral disc space narrowing. Disc bulging eccentric to the right with associated right far lateral reactive endplate changes. Mild right lateral recess narrowing without significant canal stenosis. Foramina are patent. L2-3: Chronic intervertebral disc space narrowing with diffuse disc bulge and reactive endplate changes.  Moderate facet hypertrophy. Resultant mild canal with moderate bilateral lateral recess narrowing. Moderate bilateral L2 foraminal stenosis. L3-4: Diffuse disc bulge with intervertebral disc space narrowing. Moderate facet arthrosis with ligamentum flavum hypertrophy. Resultant moderate to severe canal and bilateral subarticular stenosis. Thecal sac measures 8 mm in AP diameter. Moderate bilateral L3 foraminal stenosis, slightly worse on the right. L4-5: Chronic intervertebral disc space narrowing with diffuse disc bulge. Moderate to advanced facet arthrosis with ligamentum flavum hypertrophy. Severe canal and bilateral subarticular stenosis. Thecal sac measures approximately 4-5 mm in AP diameter. Moderate bilateral L4 foraminal stenosis. L5-S1: Diffuse disc bulge with chronic intervertebral disc space narrowing. Superimposed small central disc protrusion indents the ventral thecal sac. Mild facet hypertrophy. No significant canal stenosis. Mild right L5 foraminal narrowing. IMPRESSION: MRI HEAD SPINE IMPRESSION: 1. No acute intracranial process identified. 2. Age-related cerebral atrophy with mild chronic small vessel ischemic disease. 3. Chronic right sphenoid sinusitis. MRI CERVICAL SPINE IMPRESSION: 1. 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 2. Question faint focus of myelomalacia within the cervical spinal cord at the level of C6 as above. 3. Multifactorial degenerative changes with resultant severe multilevel foraminal narrowing as above, severe bilaterally at C3-4 through C6-7. 4. Right greater than left facet arthritis with associated reactive edema as above. Finding could serve as a source for neck pain. MRI THORACIC SPINE IMPRESSION: 1. Multilevel noncompressive disc bulging throughout the thoracic spine without significant canal stenosis. Superimposed small disc protrusions at T5-6 and T9-10 as above. 2. Multilevel facet arthropathy  within the lower  thoracic spine, most prevalent at T11-12. 3. Prominent discogenic reactive endplate changes at C6-7. MRI LUMBAR SPINE IMPRESSION: 1. Multilevel degenerative disc disease and facet arthrosis with resulting canal stenosis at L2-3 through L4-5. Changes most pronounced at the L4-5 level were there is severe spinal stenosis. 2. Multifactorial degenerative changes with resultant moderate multilevel foraminal narrowing at L2-3 through L4-5 bilaterally. Electronically Signed   By: Rise Mu M.D.   On: 05/21/2017 22:44    Assessment and Plan:   1. PAF - Maintaining sinus rhythm at slow ventricular rate. Last dose of Xarelto AM of 11/9. Continue amiodarone 200mg  qd and Toprol XL 50mg  qd/  2. Surgical clearance - Her symptoms are stable. She will be at acceptable risk for any surgery given her cardiac hx. MD to review.   3. HTN - BP stable on current medications  For questions or updates, please contact [facility] Please consult [email or web address] for contact info under Cardiology/STEMI.   Lorelei Pont, PA  05/23/2017 10:18 AM   The history and physical is as outlined above  Presurgical clearance for relief of cervical stenosis and resultant myelopathy  Paroxysmal atrial fibrillation on amiodarone  Sinus bradycardia  Hypertension  Acute renal injury  .  She is aware of the potential risks but she also has adamant she would like to proceed with surgery.  Cardiac surgical risks are actually quite minimal based on her functional status.  We will continue her beta-blockers and amiodarone will decrease her beta-blocker from 50 twice daily--25 twice daily  Encourage p.o. fluid intake  She has been off of Xarelto since Friday a.m.  I have instructed her and her family that it should be taken with the evening meal.

## 2017-05-23 NOTE — Progress Notes (Signed)
Occupational Therapy Treatment Patient Details Name: [name] MRN: [id number] DOB: 10/28/1927 Today's Date: 05/23/2017    History of present illness Pt is an 83 y.o. female with atrial fibrillation on Eliquis who presented to the ED with a 2 day history of RUE and RLE weakness as well as left facial droop. Stroke was initially suspected but MRI brain was negative for acute infarction. multilevel cervical disc degeneration with marked stenosis with cord compression and resultant signal abnormality worse at the C5-6 level but also present at C3-4 and C6-7. She has accompanying severe lumbar stenosis at L3-4 and L4-5. PMH includes arthritis, depression, chronic kidney disease, dysrhythmia, HTN, GERD, peripheral vascular disease.   OT comments  Provided pt with handout and education on theraputty exercises to increase strength, pinch, and FM skills in pt R (dominant) hand. Pt performing exercises with visual and verbal cues. Pt presenting with decreased strength and tendency to flex digits; required cues to maintain extension during exercises. Family very supportive and present for education to increase carry over. Pt planning for surgery tomorrow (05/24/17). Continue to recommend dc to post-acute rehab to optimize safety and independence with ADLs and functional mobility. Will follow acutely to facilitate safe dc.   Follow Up Recommendations  SNF;Supervision/Assistance - 24 hour(HH if she can have 24/7 assist at home)    Equipment Recommendations  (shower seat if she does not have one)    Recommendations for Other Services      Precautions / Restrictions Precautions Precautions: Fall Restrictions Weight Bearing Restrictions: No       Mobility Bed Mobility Overal bed mobility: Needs Assistance Bed Mobility: Supine to Sit;Sit to Supine     Supine to sit: Supervision Sit to supine: Supervision   General bed mobility comments: supervision for safety. family  assisting  Transfers Overall transfer level: Needs assistance Equipment used: Rolling walker (2 wheeled) Transfers: Sit to/from Stand Sit to Stand: Min guard Stand pivot transfers: Min guard       General transfer comment: Focused session on R hand excericses at EOB    Balance Overall balance assessment: Needs assistance Sitting-balance support: Feet supported Sitting balance-Leahy Scale: Good     Standing balance support: During functional activity;Bilateral upper extremity supported Standing balance-Leahy Scale: Poor                             ADL either performed or assessed with clinical judgement   ADL Overall ADL's : Needs assistance/impaired                                       General ADL Comments: Focused session on R hand FM and theraputty excercises     Vision       Perception     Praxis      Cognition Arousal/Alertness: Awake/alert Behavior During Therapy: WFL for tasks assessed/performed Overall Cognitive Status: Within Functional Limits for tasks assessed                                 General Comments: Slower processing and increased VCs for follow activity instructions. Feel this is close to baseline        Exercises Exercises: Hand exercises;Other exercises Hand Exercises Digit Composite Flexion: Right;5 reps;Seated Composite Extension: Right;5 reps;Seated;Limitations Composite Extension Limitations: Pt with limited extension  of fingers and tendency to resume flexion of all digits (similar to stroke symptoms) Digit Lifts: AROM;Right;5 reps;Seated Digit Lifts Limitations: Pt with limited strength and coordination. Educated pt on lifting all digits at once and isolating each digit. Other Exercises Other Exercises: Provided pt with handout and education on theraputty excercises to increase pt FM skills and strength in her dominant hand. Pt requiring increased cues to maintain extension of R hand while  performing excercises.    Shoulder Instructions       General Comments Daughter and great granddaughter present throughout session    Pertinent Vitals/ Pain       Pain Assessment: No/denies pain  Home Living Family/patient expects to be discharged to:: Private residence Living Arrangements: Alone Available Help at Discharge: Family;Other (Comment)(going to talk to family about 24/7 assist) Type of Home: House Home Access: Ramped entrance     Home Layout: One level(with basement)     Bathroom Shower/Tub: Tub only;Walk-in shower   Bathroom Toilet: Standard     Home Equipment: Environmental consultantWalker - 2 wheels;Walker - 4 wheels;Cane - single point;Toilet riser      Lives With: Alone    Prior Functioning/Environment Level of Independence: Independent with assistive device(s)        Comments: pt reported that she ambulates with RW   Frequency  Min 2X/week        Progress Toward Goals  OT Goals(current goals can now be found in the care plan section)  Progress towards OT goals: Progressing toward goals  Acute Rehab OT Goals Patient Stated Goal: return to PLOF OT Goal Formulation: With patient Time For Goal Achievement: 05/29/17 Potential to Achieve Goals: Good ADL Goals Pt Will Perform Lower Body Bathing: with set-up;with supervision;sit to/from stand;with adaptive equipment Pt Will Perform Lower Body Dressing: with set-up;with supervision;with adaptive equipment;sit to/from stand Pt Will Transfer to Toilet: with min guard assist;ambulating Additional ADL Goal #1: Pt will independently perform Lt UE exercises to increase strength and coordination.  Plan Discharge plan remains appropriate    Co-evaluation                 AM-PAC PT "6 Clicks" Daily Activity     Outcome Measure   Help from another person eating meals?: A Little Help from another person taking care of personal grooming?: A Little Help from another person toileting, which includes using toliet,  bedpan, or urinal?: A Little Help from another person bathing (including washing, rinsing, drying)?: A Lot Help from another person to put on and taking off regular upper body clothing?: A Little Help from another person to put on and taking off regular lower body clothing?: A Lot 6 Click Score: 16    End of Session Equipment Utilized During Treatment: Other (comment)(Theraputty)  OT Visit Diagnosis: Unsteadiness on feet (R26.81)   Activity Tolerance Patient tolerated treatment well   Patient Left in bed;with call bell/phone within reach;with bed alarm set(sitting EOB with family; notified RN)   Nurse Communication Other (comment);Mobility status(Pt EOB with family continuing hand excercises)        Time: [id number] OT Time Calculation (min): 24 min  Charges: OT General Charges $OT Visit: 1 Visit OT Treatments $Therapeutic Activity: 23-37 mins  Wing Schoch MSOT, OTR/L Acute Rehab Pager: [phone] Office: [phone]   Theodoro GristCharis M Jhair Witherington 05/23/2017, 4:55 PM

## 2017-05-23 NOTE — Plan of Care (Addendum)
Patient coping well with recent situation and hoping surgery will be done so that she can get better relief from her pain in her back

## 2017-05-24 ENCOUNTER — Inpatient Hospital Stay (HOSPITAL_COMMUNITY): Payer: Medicare Other | Admitting: Anesthesiology

## 2017-05-24 ENCOUNTER — Inpatient Hospital Stay (HOSPITAL_COMMUNITY): Payer: Medicare Other

## 2017-05-24 ENCOUNTER — Inpatient Hospital Stay (HOSPITAL_COMMUNITY)
Admission: EM | Disposition: A | Discharge status: Rehabilitation Facility | Payer: Self-pay | Source: Home / Self Care | Attending: Internal Medicine

## 2017-05-24 ENCOUNTER — Other Ambulatory Visit: Payer: Self-pay

## 2017-05-24 ENCOUNTER — Encounter (HOSPITAL_COMMUNITY): Payer: Self-pay | Admitting: Anesthesiology

## 2017-05-24 DIAGNOSIS — G959 Disease of spinal cord, unspecified: Secondary | ICD-10-CM | POA: Diagnosis present

## 2017-05-24 DIAGNOSIS — I480 Paroxysmal atrial fibrillation: Secondary | ICD-10-CM | POA: Diagnosis present

## 2017-05-24 DIAGNOSIS — I5032 Chronic diastolic (congestive) heart failure: Secondary | ICD-10-CM | POA: Diagnosis present

## 2017-05-24 DIAGNOSIS — N183 Chronic kidney disease, stage 3 unspecified: Secondary | ICD-10-CM | POA: Diagnosis present

## 2017-05-24 DIAGNOSIS — I10 Essential (primary) hypertension: Secondary | ICD-10-CM | POA: Diagnosis present

## 2017-05-24 DIAGNOSIS — M4802 Spinal stenosis, cervical region: Secondary | ICD-10-CM | POA: Diagnosis present

## 2017-05-24 HISTORY — PX: ANTERIOR CERVICAL DECOMP/DISCECTOMY FUSION: SHX1161

## 2017-05-24 LAB — CBC
HCT: 40 % (ref 36.0–46.0)
Hemoglobin: 13.4 g/dL (ref 12.0–15.0)
MCH: 31.1 pg (ref 26.0–34.0)
MCHC: 33.5 g/dL (ref 30.0–36.0)
MCV: 92.8 fL (ref 78.0–100.0)
PLATELETS: 184 10*3/uL (ref 150–400)
RBC: 4.31 MIL/uL (ref 3.87–5.11)
RDW: 15.4 % (ref 11.5–15.5)
WBC: 5.4 10*3/uL (ref 4.0–10.5)

## 2017-05-24 LAB — BASIC METABOLIC PANEL
ANION GAP: 7 (ref 5–15)
BUN: 20 mg/dL (ref 6–20)
CO2: 26 mmol/L (ref 22–32)
Calcium: 8.7 mg/dL — ABNORMAL LOW (ref 8.9–10.3)
Chloride: 101 mmol/L (ref 101–111)
Creatinine, Ser: 1.21 mg/dL — ABNORMAL HIGH (ref 0.44–1.00)
GFR calc non Af Amer: 39 mL/min — ABNORMAL LOW (ref >60)
GFR, EST AFRICAN AMERICAN: 45 mL/min — AB (ref >60)
Glucose, Bld: 92 mg/dL (ref 65–99)
POTASSIUM: 3.9 mmol/L (ref 3.5–5.1)
SODIUM: 134 mmol/L — AB (ref 135–145)

## 2017-05-24 LAB — SURGICAL PCR SCREEN
MRSA, PCR: NEGATIVE
Staphylococcus aureus: NEGATIVE

## 2017-05-24 SURGERY — ANTERIOR CERVICAL DECOMPRESSION/DISCECTOMY FUSION 3 LEVELS
Anesthesia: General | Site: Spine Cervical

## 2017-05-24 MED ORDER — THROMBIN (RECOMBINANT) 5000 UNITS EX SOLR
CUTANEOUS | Status: AC
  Filled 2017-05-24: qty 5000

## 2017-05-24 MED ORDER — LIDOCAINE 2% (20 MG/ML) 5 ML SYRINGE
INTRAMUSCULAR | Status: AC
  Filled 2017-05-24: qty 5

## 2017-05-24 MED ORDER — ROCURONIUM BROMIDE 10 MG/ML (PF) SYRINGE
PREFILLED_SYRINGE | INTRAVENOUS | Status: AC
  Filled 2017-05-24: qty 5

## 2017-05-24 MED ORDER — SUCCINYLCHOLINE CHLORIDE 200 MG/10ML IV SOSY
PREFILLED_SYRINGE | INTRAVENOUS | Status: AC
  Filled 2017-05-24: qty 10

## 2017-05-24 MED ORDER — SODIUM CHLORIDE 0.9 % IR SOLN
Status: DC | PRN
  Administered 2017-05-24: 17:00:00

## 2017-05-24 MED ORDER — EPHEDRINE 5 MG/ML INJ
INTRAVENOUS | Status: AC
  Filled 2017-05-24: qty 10

## 2017-05-24 MED ORDER — ONDANSETRON HCL 4 MG/2ML IJ SOLN
INTRAMUSCULAR | Status: AC
  Filled 2017-05-24: qty 2

## 2017-05-24 MED ORDER — PHENYLEPHRINE 40 MCG/ML (10ML) SYRINGE FOR IV PUSH (FOR BLOOD PRESSURE SUPPORT)
PREFILLED_SYRINGE | INTRAVENOUS | Status: AC
  Filled 2017-05-24: qty 10

## 2017-05-24 MED ORDER — LIDOCAINE HCL (CARDIAC) 20 MG/ML IV SOLN
INTRAVENOUS | Status: DC | PRN
  Administered 2017-05-24: 60 mg via INTRAVENOUS

## 2017-05-24 MED ORDER — 0.9 % SODIUM CHLORIDE (POUR BTL) OPTIME
TOPICAL | Status: DC | PRN
  Administered 2017-05-24: 1000 mL

## 2017-05-24 MED ORDER — FENTANYL CITRATE (PF) 250 MCG/5ML IJ SOLN
INTRAMUSCULAR | Status: AC
  Filled 2017-05-24: qty 5

## 2017-05-24 MED ORDER — DEXAMETHASONE SODIUM PHOSPHATE 10 MG/ML IJ SOLN
INTRAMUSCULAR | Status: DC | PRN
  Administered 2017-05-24: 10 mg via INTRAVENOUS

## 2017-05-24 MED ORDER — LACTATED RINGERS IV SOLN
INTRAVENOUS | Status: DC
  Administered 2017-05-24: 15:00:00 via INTRAVENOUS

## 2017-05-24 MED ORDER — THROMBIN (RECOMBINANT) 20000 UNITS EX SOLR
CUTANEOUS | Status: DC | PRN
  Administered 2017-05-24: 17:00:00 via TOPICAL

## 2017-05-24 MED ORDER — THROMBIN (RECOMBINANT) 5000 UNITS EX SOLR
OROMUCOSAL | Status: DC | PRN
  Administered 2017-05-24 (×2): via TOPICAL

## 2017-05-24 MED ORDER — PROPOFOL 10 MG/ML IV BOLUS
INTRAVENOUS | Status: DC | PRN
  Administered 2017-05-24: 130 mg via INTRAVENOUS

## 2017-05-24 MED ORDER — PROPOFOL 10 MG/ML IV BOLUS
INTRAVENOUS | Status: AC
  Filled 2017-05-24: qty 20

## 2017-05-24 MED ORDER — FENTANYL CITRATE (PF) 100 MCG/2ML IJ SOLN
25.0000 ug | INTRAMUSCULAR | Status: DC | PRN
Start: 2017-05-24 — End: 2017-05-24
  Administered 2017-05-24: 50 ug via INTRAVENOUS

## 2017-05-24 MED ORDER — ROCURONIUM BROMIDE 100 MG/10ML IV SOLN
INTRAVENOUS | Status: DC | PRN
  Administered 2017-05-24 (×2): 10 mg via INTRAVENOUS
  Administered 2017-05-24: 50 mg via INTRAVENOUS

## 2017-05-24 MED ORDER — FENTANYL CITRATE (PF) 100 MCG/2ML IJ SOLN
INTRAMUSCULAR | Status: DC | PRN
  Administered 2017-05-24 (×3): 50 ug via INTRAVENOUS
  Administered 2017-05-24 (×2): 25 ug via INTRAVENOUS

## 2017-05-24 MED ORDER — EPHEDRINE SULFATE-NACL 50-0.9 MG/10ML-% IV SOSY
PREFILLED_SYRINGE | INTRAVENOUS | Status: DC | PRN
  Administered 2017-05-24: 10 mg via INTRAVENOUS

## 2017-05-24 MED ORDER — FENTANYL CITRATE (PF) 100 MCG/2ML IJ SOLN: 25.0000 ug | INTRAMUSCULAR | Status: DC | PRN

## 2017-05-24 MED ORDER — THROMBIN (RECOMBINANT) 20000 UNITS EX SOLR
CUTANEOUS | Status: AC
  Filled 2017-05-24: qty 20000

## 2017-05-24 MED ORDER — OXYCODONE HCL 5 MG PO TABS
ORAL_TABLET | ORAL | Status: AC
  Administered 2017-05-24: 5 mg via ORAL
  Filled 2017-05-24: qty 1

## 2017-05-24 MED ORDER — PHENYLEPHRINE HCL 10 MG/ML IJ SOLN
INTRAVENOUS | Status: DC | PRN
  Administered 2017-05-24: 25 ug/min via INTRAVENOUS

## 2017-05-24 MED ORDER — SUGAMMADEX SODIUM 200 MG/2ML IV SOLN
INTRAVENOUS | Status: DC | PRN
  Administered 2017-05-24: 200 mg via INTRAVENOUS

## 2017-05-24 MED ORDER — SUCCINYLCHOLINE CHLORIDE 200 MG/10ML IV SOSY
PREFILLED_SYRINGE | INTRAVENOUS | Status: DC | PRN
  Administered 2017-05-24: 100 mg via INTRAVENOUS

## 2017-05-24 MED ORDER — DEXAMETHASONE SODIUM PHOSPHATE 10 MG/ML IJ SOLN
INTRAMUSCULAR | Status: AC
  Filled 2017-05-24: qty 1

## 2017-05-24 MED ORDER — FENTANYL CITRATE (PF) 100 MCG/2ML IJ SOLN
INTRAMUSCULAR | Status: AC
  Administered 2017-05-24: 50 ug via INTRAVENOUS
  Filled 2017-05-24: qty 2

## 2017-05-24 MED ORDER — ONDANSETRON HCL 4 MG/2ML IJ SOLN
INTRAMUSCULAR | Status: DC | PRN
Start: 2017-05-24 — End: 2017-05-25
  Administered 2017-05-24: 4 mg via INTRAVENOUS

## 2017-05-24 MED ORDER — CEFAZOLIN SODIUM-DEXTROSE 1-4 GM/50ML-% IV SOLN
1.0000 g | Freq: Three times a day (TID) | INTRAVENOUS | Status: AC
  Administered 2017-05-24 – 2017-05-25 (×3): 1 g via INTRAVENOUS
  Filled 2017-05-24 (×3): qty 50

## 2017-05-24 SURGICAL SUPPLY — 62 items
APL SKNCLS STERI-STRIP NONHPOA (GAUZE/BANDAGES/DRESSINGS) ×1
BAG DECANTER FOR FLEXI CONT (MISCELLANEOUS) ×3 IMPLANT
BENZOIN TINCTURE PRP APPL 2/3 (GAUZE/BANDAGES/DRESSINGS) ×3 IMPLANT
BIT DRILL 13 (BIT) ×1 IMPLANT
BIT DRILL 13MM (BIT) ×1
BUR MATCHSTICK NEURO 3.0 LAGG (BURR) ×3 IMPLANT
CAGE PEEK 6X14X11 (Cage) ×3 IMPLANT
CAGE PEEK 7X14X11 (Cage) ×3 IMPLANT
CANISTER SUCT 3000ML PPV (MISCELLANEOUS) ×3 IMPLANT
CARTRIDGE OIL MAESTRO DRILL (MISCELLANEOUS) ×1 IMPLANT
CLOSURE WOUND 1/2 X4 (GAUZE/BANDAGES/DRESSINGS) ×1
DIFFUSER DRILL AIR PNEUMATIC (MISCELLANEOUS) ×3 IMPLANT
DRAPE C-ARM 42X72 X-RAY (DRAPES) ×6 IMPLANT
DRAPE LAPAROTOMY 100X72 PEDS (DRAPES) ×3 IMPLANT
DRAPE MICROSCOPE LEICA (MISCELLANEOUS) ×3 IMPLANT
DRAPE POUCH INSTRU U-SHP 10X18 (DRAPES) ×3 IMPLANT
DURAPREP 6ML APPLICATOR 50/CS (WOUND CARE) ×3 IMPLANT
ELECT COATED BLADE 2.86 ST (ELECTRODE) ×3 IMPLANT
ELECT REM PT RETURN 9FT ADLT (ELECTROSURGICAL) ×3
ELECTRODE REM PT RTRN 9FT ADLT (ELECTROSURGICAL) ×1 IMPLANT
GAUZE SPONGE 4X4 12PLY STRL (GAUZE/BANDAGES/DRESSINGS) ×3 IMPLANT
GAUZE SPONGE 4X4 16PLY XRAY LF (GAUZE/BANDAGES/DRESSINGS) IMPLANT
GLOVE ECLIPSE 9.0 STRL (GLOVE) ×3 IMPLANT
GLOVE EXAM NITRILE LRG STRL (GLOVE) IMPLANT
GLOVE EXAM NITRILE XL STR (GLOVE) IMPLANT
GLOVE EXAM NITRILE XS STR PU (GLOVE) IMPLANT
GOWN STRL REUS W/ TWL LRG LVL3 (GOWN DISPOSABLE) IMPLANT
GOWN STRL REUS W/ TWL XL LVL3 (GOWN DISPOSABLE) IMPLANT
GOWN STRL REUS W/TWL 2XL LVL3 (GOWN DISPOSABLE) IMPLANT
GOWN STRL REUS W/TWL LRG LVL3 (GOWN DISPOSABLE)
GOWN STRL REUS W/TWL XL LVL3 (GOWN DISPOSABLE)
HALTER HD/CHIN CERV TRACTION D (MISCELLANEOUS) ×3 IMPLANT
HEMOSTAT POWDER KIT SURGIFOAM (HEMOSTASIS) ×4 IMPLANT
KIT BASIN OR (CUSTOM PROCEDURE TRAY) ×3 IMPLANT
KIT ROOM TURNOVER OR (KITS) ×3 IMPLANT
NDL SPNL 20GX3.5 QUINCKE YW (NEEDLE) ×1 IMPLANT
NEEDLE SPNL 20GX3.5 QUINCKE YW (NEEDLE) ×3 IMPLANT
NS IRRIG 1000ML POUR BTL (IV SOLUTION) ×3 IMPLANT
OIL CARTRIDGE MAESTRO DRILL (MISCELLANEOUS) ×3
PACK LAMINECTOMY NEURO (CUSTOM PROCEDURE TRAY) ×3 IMPLANT
PAD ARMBOARD 7.5X6 YLW CONV (MISCELLANEOUS) ×9 IMPLANT
PEEK CAGE 7X14X11 (Cage) ×2 IMPLANT
PLATE 2 42.5XLCK NS SPNE CVD (Plate) IMPLANT
PLATE 2 ATLANTIS TRANS (Plate) ×3 IMPLANT
PLATE 23MM (Plate) ×2 IMPLANT
RUBBERBAND STERILE (MISCELLANEOUS) ×6 IMPLANT
SCREW ST FIX 4 ATL 3120213 (Screw) ×20 IMPLANT
SPACER SPNL 11X14X6XPEEK CVD (Cage) IMPLANT
SPACER SPNL 11X14X7XPEEK CVD (Cage) IMPLANT
SPCR SPNL 11X14X6XPEEK CVD (Cage) ×1 IMPLANT
SPCR SPNL 11X14X7XPEEK CVD (Cage) ×1 IMPLANT
SPONGE INTESTINAL PEANUT (DISPOSABLE) ×3 IMPLANT
SPONGE SURGIFOAM ABS GEL 100 (HEMOSTASIS) ×3 IMPLANT
STRIP CLOSURE SKIN 1/2X4 (GAUZE/BANDAGES/DRESSINGS) ×2 IMPLANT
SUT VIC AB 3-0 SH 8-18 (SUTURE) ×3 IMPLANT
SUT VIC AB 4-0 RB1 18 (SUTURE) ×3 IMPLANT
TAPE CLOTH 4X10 WHT NS (GAUZE/BANDAGES/DRESSINGS) ×3 IMPLANT
TAPE CLOTH SURG 4X10 WHT LF (GAUZE/BANDAGES/DRESSINGS) ×2 IMPLANT
TOWEL GREEN STERILE (TOWEL DISPOSABLE) ×3 IMPLANT
TOWEL GREEN STERILE FF (TOWEL DISPOSABLE) ×3 IMPLANT
TRAP SPECIMEN MUCOUS 40CC (MISCELLANEOUS) ×3 IMPLANT
WATER STERILE IRR 1000ML POUR (IV SOLUTION) ×3 IMPLANT

## 2017-05-24 NOTE — Brief Op Note (Signed)
05/24/2017  6:41 PM  PATIENT:  Sherwood Gamblerorothy W Daughety  83 y.o. female  PRE-OPERATIVE DIAGNOSIS:  Cervical stenosis with myelopathy  POST-OPERATIVE DIAGNOSIS:  Cervical stenosis with myelopathy  PROCEDURE:  Procedure(s): ANTERIOR CERVICAL DECOMPRESSION/DISCECTOMY FUSION CERVICAL THREE-FOUR, CERVICAL FIVE-SIX, CERVICAL SIX-SEVEN (N/A)  SURGEON:  Surgeon(s) and Role:    * Julio SicksPool, Tehilla Coffel, MD - Primary    * Ditty, Loura HaltBenjamin Jared, MD - Assisting  PHYSICIAN ASSISTANT:   ASSISTANTS:    ANESTHESIA:   general  EBL:  300 mL   BLOOD ADMINISTERED:none  DRAINS: none   LOCAL MEDICATIONS USED:  NONE  SPECIMEN:  No Specimen  DISPOSITION OF SPECIMEN:  N/A  COUNTS:  YES  TOURNIQUET:  * No tourniquets in log *  DICTATION: .Dragon Dictation  PLAN OF CARE: Admit to inpatient   PATIENT DISPOSITION:  PACU - hemodynamically stable.   Delay start of Pharmacological VTE agent (>24hrs) due to surgical blood loss or risk of bleeding: yes

## 2017-05-24 NOTE — Op Note (Signed)
Date of procedure: 05/24/2017  Date of dictation: Same  Service: Neurosurgery  Preoperative diagnosis: cervical stenosis with myelopathy  Postoperative diagnosis: Same  Procedure Name: C3-4 anterior cervical discectomy with interbody fusion utilizing interbody peek cage, locally harvested autograft, and anterior plate instrumentation  C5-6, C6-7 anterior cervical discectomy with interbody fusion utilizing interbody peek cages, locally harvested autograft, and anterior plate instrumentation  Surgeon:Calene Paradiso A.Sariah Henkin, M.D.  Asst. Surgeon: Ditty  Anesthesia: General  Indication:83 year old female with subacute history of progressive upper and lower extremity weakness. Workup demonstrates evidence of critical multilevel cervical stenosis. Patient presents now for anterior cervical decompression infusion in hopes of improving her symptoms.  Operative note:after induction anesthesia, patient position prone supine with neck slightly extended and held in place of halter traction. Anterior cervical region prepped and draped sterilely. Incision made overlying C5. Dissection performed on the right. Retractor placed. Fluoroscopy used. Levels confirmed. Disc space at C3-4 was then incised with 15 blade. Discectomy then performed using various instruments down to level posterior annulus. Microscope brought field used throughout the remainder of the discectomy. Remaining aspects of annulus and osteophytes removed using high-speed drill. Posterior longitudinal ligament was then elevated and resected. Underlying thecal sac was identified. Wide central decompression was then performed by undercutting the bodies of C3 and C4. Decompression then proceeded into each neural foramen. Wide anterior foraminotomies performed on the course exiting C4 nerve roots bilaterally. At this point a very thorough decompression of been achieved. There was no evidence of injury to thecal sac or nerve roots.Gelfoam was placed topically.  Retractor was repositioned and discectomies were then performed at the C5-6 and C6-7 levels again without complication. Disc spaces were prepared for interbody fusion. Utilizing [employer] interbody peek cages packed with locally harvested autograft. Each cage was impacted into place recessed slightly from the anterior cortical margin. Atlantis anterior cervical plate was then placed at the C3 and C4 level. This an attachment or fluoroscopic is seen 13 monitor fixed angle screws 2 each at both levels. All 4 screws given a final tightening found be solidly within the bone. Atlantis translational plate was then placed over the C5-6 and C7 levels. This an attachment or fluoroscopic guidance heme 13 motor fixed angle screws to reach it all 3 levels. All 6 screws given a final tightening found to be solidly within the bone. Locking screws engaged at all levels. Final images revealed good position of the cages and hardware at proper upper level with improved alignment of spine. Wound is irrigated one final time. Hemostasis was assured. Wounds and close in layers of Vicryl sutures. Steri-Strips and sterile dressing were applied. No apparent palpitations. Patient tolerated the procedure well and she returns to the recovery room postop.

## 2017-05-24 NOTE — Progress Notes (Signed)
Orthopedic Tech Progress Note Patient Details:  [name] 06/05/1928 [id number]  Ortho Devices Type of Ortho Device: Soft collar Ortho Device/Splint Interventions: Ordered RN had just put ice on and just wanted me to leave the collar.  Trinna PostMartinez, Cannan Beeck J 05/24/2017, 7:45 PM

## 2017-05-24 NOTE — Progress Notes (Addendum)
PROGRESS NOTE    [name]  Solomon:[id number] DOB: 02/17/1928 DOA: 05/21/2017 PCP: Lovenia KimHepler, Mark, PA-C     Brief Narrative:  [name],with past medical history significant for chronic kidney disease ,osteoarthritis,arthritis of the spine and paroxysmal atrial fibrillation on Xareltopresenting with 2 weekshistory of right-sided lower extremity numbness and weakness. She received steroid spinal injection by Dr. Ethelene Halamos (orthopedic surgery) couple of weeks ago without improvement in right sciatica. She has now developed worsening right lower extremity weakness and presents to the hospital.   Assessment & Plan:   Principal Problem:   Cervical stenosis of spine Active Problems:   Right sided weakness   Cervical myelopathy (HCC)   AF (paroxysmal atrial fibrillation) (HCC)   Chronic diastolic CHF (congestive heart failure) (HCC)   CKD (chronic kidney disease) stage 3, GFR 30-59 ml/min (HCC)   Essential hypertension  Right sided weakness secondary to severe cervical stenosis with myelopathy  -MRI brain negative for stroke -MRI spine revealed severe cervical spondylolysis with resultant severe diffuse canal stenosis, most severe at C3-4, C5-6, and C6-7. Multilevel facet arthropathy within the lower thoracic spine most prevalent at T11-12. Multilevel degenerative disc disease and facet arthrosis with resulting canal stenosis at L2-3 through L4-5. Changes most pronounced at the L4-5 level were there is severe spinal stenosis. -Neurology and neurosurgery consulted -Serum B12 normal. Serum copper ordered, pending  -Planning for anterior cervical discectomy with fusion on 11/12  -Will need PT OT, possibly SNF on discharge   Paroxysmal A fib -Sinus rhythm currently -Holding xarelto  -Continue amiodarone, toprol  Chronic diastolic HF -Stable, euvolemic   CKD stage 3 -Baseline Cr 1 -Stable, monitor   HTN -Continue norvasc, toprol    DVT prophylaxis: SCD Code  Status: DNR Family Communication: Daughter at bedside Disposition Plan: Pending surgical intervention   Consultants:   Neurology  Neurosurgery  Cardiology for surgical clearance   Procedures:   None  Antimicrobials:  Anti-infectives (From admission, onward)   Start     Dose/Rate Route Frequency Ordered Stop   05/24/17 0600  ceFAZolin (ANCEF) IVPB 2g/100 mL premix     2 g 200 mL/hr over 30 Minutes Intravenous On call to O.R. 05/23/17 [id number] 05/25/17 0559       Subjective: Patient was no new complaints today.  Still feeling weak on the right side.  No chest pain or shortness of breath, no nausea or vomiting.  Wondering what time surgery will be.  Objective: Vitals:   05/23/17 2140 05/24/17 0052 05/24/17 0509 05/24/17 0923  BP: (!) 163/66 (!) 156/53 (!) 165/67 (!) 181/66  Pulse: 60 (!) 56 62 (!) 57  Resp: 20 20 20 20   Temp: 98.8 F (37.1 C) 98.5 F (36.9 C) 98 F (36.7 C) 97.8 F (36.6 C)  TempSrc: Oral Oral Oral Oral  SpO2: 96% 97% 96% 100%  Weight:      Height:        Intake/Output Summary (Last 24 hours) at 05/24/2017 1157 Last data filed at 05/23/2017 1532 Gross per 24 hour  Intake 500 ml  Output 200 ml  Net 300 ml   Filed Weights   05/22/17 0200  Weight: 84.7 kg (186 lb 12.8 oz)    Examination:  General exam: Appears calm and comfortable  Respiratory system: Clear to auscultation. Respiratory effort normal. Cardiovascular system: S1 & S2 heard, regular rhythm, rate 50s. No JVD, murmurs, rubs, gallops or clicks. No pedal edema. Gastrointestinal system: Abdomen is nondistended, soft and nontender. No organomegaly or masses felt.  Normal bowel sounds heard. Central nervous system: Alert and oriented. CN 2-12 grossly in tact, RLE and RUE weaker compared to left  Extremities: Symmetric in appearance  Skin: No rashes, lesions or ulcers Psychiatry: Judgement and insight appear normal. Mood & affect appropriate.   Data Reviewed: I have personally reviewed  following labs and imaging studies  CBC: Recent Labs  Lab 05/21/17 1055 05/22/17 0620 05/24/17 0524  WBC 4.1 6.0 5.4  HGB 14.5 14.1 13.4  HCT 42.2 41.6 40.0  MCV 90.9 91.2 92.8  PLT 198 209 184   Basic Metabolic Panel: Recent Labs  Lab 05/21/17 1055 05/22/17 0620 05/23/17 0345 05/24/17 0524  NA 133*  --  134* 134*  K 4.1  --  4.0 3.9  CL 101  --  103 101  CO2 24  --  24 26  GLUCOSE 111*  --  96 92  BUN 14  --  24* 20  CREATININE 1.09* 1.24* 1.30* 1.21*  CALCIUM 9.0  --  8.6* 8.7*   GFR: Estimated Creatinine Clearance: 36.6 mL/min (A) (by C-G formula based on SCr of 1.21 mg/dL (H)). Liver Function Tests: No results for input(s): AST, ALT, ALKPHOS, BILITOT, PROT, ALBUMIN in the last 168 hours. No results for input(s): LIPASE, AMYLASE in the last 168 hours. No results for input(s): AMMONIA in the last 168 hours. Coagulation Profile: No results for input(s): INR, PROTIME in the last 168 hours. Cardiac Enzymes: No results for input(s): CKTOTAL, CKMB, CKMBINDEX, TROPONINI in the last 168 hours. BNP (last 3 results) No results for input(s): PROBNP in the last 8760 hours. HbA1C: Recent Labs    05/22/17 0620  HGBA1C 5.5   CBG: No results for input(s): GLUCAP in the last 168 hours. Lipid Profile: Recent Labs    05/22/17 0620  CHOL 167  HDL 81  LDLCALC 71  TRIG 73  CHOLHDL 2.1   Thyroid Function Tests: No results for input(s): TSH, T4TOTAL, FREET4, T3FREE, THYROIDAB in the last 72 hours. Anemia Panel: Recent Labs    05/22/17 1459  VITAMINB12 874   Sepsis Labs: No results for input(s): PROCALCITON, LATICACIDVEN in the last 168 hours.  Recent Results (from the past 240 hour(s))  Surgical pcr screen     Status: None   Collection Time: 05/24/17  5:05 AM  Result Value Ref Range Status   MRSA, PCR NEGATIVE NEGATIVE Final   Staphylococcus aureus NEGATIVE NEGATIVE Final    Comment: (NOTE) The Xpert SA Assay (FDA approved for NASAL specimens in patients  81 years of age and older), is one component of a comprehensive surveillance program. It is not intended to diagnose infection nor to guide or monitor treatment.        Radiology Studies: No results found.    Scheduled Meds: . amiodarone  200 mg Oral QHS  . amLODipine  2.5 mg Oral QPC breakfast  . bisacodyl  10 mg Rectal Daily  . loratadine  10 mg Oral Daily  . metoprolol succinate  25 mg Oral BID  . pantoprazole  40 mg Oral Daily  . rOPINIRole  1 mg Oral QHS   Continuous Infusions: . sodium chloride 50 mL/hr at 05/22/17 0321  .  ceFAZolin (ANCEF) IV Stopped (05/24/17 0736)     LOS: 2 days    Time spent: 30 minutes   Noralee StainJennifer Blondine Hottel, DO [facility] [email or web address] Password [email or web address] 05/24/2017, 11:57 AM

## 2017-05-24 NOTE — Anesthesia Preprocedure Evaluation (Addendum)
Anesthesia Evaluation  Patient identified by MRN, date of birth, ID band Patient awake    Reviewed: NPO status , Patient's Chart, lab work & pertinent test results  Airway Mallampati: II  TM Distance: >3 FB     Dental   Pulmonary neg pulmonary ROS,    breath sounds clear to auscultation       Cardiovascular hypertension, + Peripheral Vascular Disease and +CHF  + dysrhythmias  Rhythm:Regular Rate:Normal     Neuro/Psych    GI/Hepatic Neg liver ROS, GERD  ,  Endo/Other    Renal/GU Renal disease     Musculoskeletal  (+) Arthritis ,   Abdominal   Peds  Hematology   Anesthesia Other Findings   Reproductive/Obstetrics                             Anesthesia Physical Anesthesia Plan  ASA: III  Anesthesia Plan: General   Post-op Pain Management:    Induction: Intravenous  PONV Risk Score and Plan: 2 and Treatment may vary due to age or medical condition, Ondansetron and Dexamethasone  Airway Management Planned: Oral ETT  Additional Equipment:   Intra-op Plan:   Post-operative Plan: Possible Post-op intubation/ventilation  Informed Consent: I have reviewed the patients History and Physical, chart, labs and discussed the procedure including the risks, benefits and alternatives for the proposed anesthesia with the patient or authorized representative who has indicated his/her understanding and acceptance.   Dental advisory given  Plan Discussed with: CRNA and Anesthesiologist  Anesthesia Plan Comments:         Anesthesia Quick Evaluation

## 2017-05-24 NOTE — Anesthesia Procedure Notes (Signed)
Procedure Name: Intubation Date/Time: 05/24/2017 3:59 PM Performed by: Jed LimerickHarder, Diala Waxman S, CRNA Pre-anesthesia Checklist: Patient identified, Emergency Drugs available, Suction available and Patient being monitored Patient Re-evaluated:Patient Re-evaluated prior to induction Oxygen Delivery Method: Circle System Utilized Preoxygenation: Pre-oxygenation with 100% oxygen Induction Type: IV induction Ventilation: Mask ventilation without difficulty Laryngoscope Size: Glidescope and 3 Grade View: Grade I Tube type: Oral Tube size: 7.0 mm Number of attempts: 1 Airway Equipment and Method: Stylet and Video-laryngoscopy Placement Confirmation: ETT inserted through vocal cords under direct vision,  positive ETCO2 and breath sounds checked- equal and bilateral Secured at: 22 cm Tube secured with: Tape Dental Injury: Teeth and Oropharynx as per pre-operative assessment  Difficulty Due To: Difficult Airway-  due to neck instability Comments: Head and neck in neutral alignment throughout induction/intubation.

## 2017-05-24 NOTE — Progress Notes (Signed)
Physical Therapy Treatment Patient Details Name: Lisa GamblerDorothy W Solomon MRN: [id number] DOB: 07/30/1927 Today's Date: 05/24/2017    History of Present Illness Pt is an 83 y.o. female with atrial fibrillation on Eliquis who presented to the ED with a 2 day history of RUE and RLE weakness as well as left facial droop. Stroke was initially suspected but MRI brain was negative for acute infarction. multilevel cervical disc degeneration with marked stenosis with cord compression and resultant signal abnormality worse at the C5-6 level but also present at C3-4 and C6-7. She has accompanying severe lumbar stenosis at L3-4 and L4-5. PMH includes arthritis, depression, chronic kidney disease, dysrhythmia, HTN, GERD, peripheral vascular disease.    PT Comments    On arrival to room pt sitting EOB for pain relief. Pt very fearful of falling. When PTA instructed pt to stand, pt reported she needed two people to help her stand, however pt only required min guard to stand. Pt guarded with gait, taking small steps and heavily relaying on walker for support. Able to ambulate 12 feet in room before returning to sit again at EOB. Pt expected to have cervical discectomy this PM. Will continue to follow to maximize safety with mobility and functional independence.    Follow Up Recommendations  SNF;Other (comment)(Or home if pt can have 24/7 supervision/assist with HHPT)     Equipment Recommendations  None recommended by PT    Recommendations for Other Services       Precautions / Restrictions Precautions Precautions: Fall Restrictions Weight Bearing Restrictions: No    Mobility  Bed Mobility               General bed mobility comments: sitting EOB on arrival to pts room  Transfers Overall transfer level: Needs assistance Equipment used: Rolling walker (2 wheeled) Transfers: Sit to/from Stand Sit to Stand: Min guard         General transfer comment: Min guard for safety.    Ambulation/Gait Ambulation/Gait assistance: Min assist Ambulation Distance (Feet): 12 Feet Assistive device: Rolling walker (2 wheeled) Gait Pattern/deviations: Step-through pattern;Decreased step length - right;Decreased step length - left;Decreased stride length;Decreased dorsiflexion - right(R foot drop) Gait velocity: decreased Gait velocity interpretation: Below normal speed for age/gender General Gait Details: 12 feet in room. pt with genu recurvatum on R LE during stance phase. Unwilling to ambulate farther secondary to fear of falling.    Stairs            Wheelchair Mobility    Modified Rankin (Stroke Patients Only)       Balance Overall balance assessment: Needs assistance Sitting-balance support: Feet supported Sitting balance-Leahy Scale: Good     Standing balance support: During functional activity;Bilateral upper extremity supported Standing balance-Leahy Scale: Poor Standing balance comment: reliant on UE support                            Cognition Arousal/Alertness: Awake/alert Behavior During Therapy: WFL for tasks assessed/performed Overall Cognitive Status: Within Functional Limits for tasks assessed                                 General Comments: Slower processing and increased VCs for follow activity instructions. Feel this is close to baseline      Exercises General Exercises - Lower Extremity Long Arc Quad: AROM;Right;10 reps;Seated Heel Slides: AROM;Right;10 reps;Seated Other Exercises Other Exercises: seated marching;AROM;Right;10 reps  General Comments        Pertinent Vitals/Pain Pain Assessment: 0-10 Pain Score: 3  Pain Location: Low back Pain Descriptors / Indicators: Aching;Discomfort Pain Intervention(s): Monitored during session;Limited activity within patient's tolerance;Premedicated before session    Home Living                      Prior Function            PT Goals  (current goals can now be found in the care plan section) Acute Rehab PT Goals Patient Stated Goal: return to PLOF PT Goal Formulation: With patient Time For Goal Achievement: 06/05/17 Potential to Achieve Goals: Good Progress towards PT goals: Progressing toward goals    Frequency    Min 3X/week      PT Plan Current plan remains appropriate    Co-evaluation              AM-PAC PT "6 Clicks" Daily Activity  Outcome Measure  Difficulty turning over in bed (including adjusting bedclothes, sheets and blankets)?: None Difficulty moving from lying on back to sitting on the side of the bed? : A Little Difficulty sitting down on and standing up from a chair with arms (e.g., wheelchair, bedside commode, etc,.)?: Unable Help needed moving to and from a bed to chair (including a wheelchair)?: A Little Help needed walking in hospital room?: A Little Help needed climbing 3-5 steps with a railing? : A Lot 6 Click Score: 16    End of Session Equipment Utilized During Treatment: Gait belt Activity Tolerance: Patient tolerated treatment well Patient left: in bed;with call bell/phone within reach(sitting EOB) Nurse Communication: Mobility status PT Visit Diagnosis: Unsteadiness on feet (R26.81);Other abnormalities of gait and mobility (R26.89)     Time: [id number] PT Time Calculation (min) (ACUTE ONLY): 14 min  Charges:  $Therapeutic Activity: 8-22 mins                    G Codes:      Kallie LocksHannah Natoshia Souter, [place]A Pager [phone] Acute Rehab  Sheral ApleyHannah E Riane Rung 05/24/2017, 1:52 PM

## 2017-05-24 NOTE — Progress Notes (Signed)
BP 181/72 Dr. Alvino Chapelhoi notified. Advised to give PO medications with sips of water, no PRN med orders at this time.

## 2017-05-25 ENCOUNTER — Encounter (HOSPITAL_COMMUNITY): Payer: Self-pay | Admitting: Neurosurgery

## 2017-05-25 DIAGNOSIS — I482 Chronic atrial fibrillation: Secondary | ICD-10-CM

## 2017-05-25 DIAGNOSIS — G8918 Other acute postprocedural pain: Secondary | ICD-10-CM

## 2017-05-25 DIAGNOSIS — I10 Essential (primary) hypertension: Secondary | ICD-10-CM

## 2017-05-25 DIAGNOSIS — G959 Disease of spinal cord, unspecified: Secondary | ICD-10-CM

## 2017-05-25 DIAGNOSIS — E871 Hypo-osmolality and hyponatremia: Secondary | ICD-10-CM

## 2017-05-25 DIAGNOSIS — M5030 Other cervical disc degeneration, unspecified cervical region: Secondary | ICD-10-CM

## 2017-05-25 DIAGNOSIS — M4802 Spinal stenosis, cervical region: Principal | ICD-10-CM

## 2017-05-25 DIAGNOSIS — N183 Chronic kidney disease, stage 3 (moderate): Secondary | ICD-10-CM

## 2017-05-25 LAB — CBC
HCT: 38.4 % (ref 36.0–46.0)
Hemoglobin: 13.1 g/dL (ref 12.0–15.0)
MCH: 31.3 pg (ref 26.0–34.0)
MCHC: 34.1 g/dL (ref 30.0–36.0)
MCV: 91.9 fL (ref 78.0–100.0)
PLATELETS: 197 10*3/uL (ref 150–400)
RBC: 4.18 MIL/uL (ref 3.87–5.11)
RDW: 15.2 % (ref 11.5–15.5)
WBC: 7.9 10*3/uL (ref 4.0–10.5)

## 2017-05-25 LAB — BASIC METABOLIC PANEL
Anion gap: 9 (ref 5–15)
BUN: 20 mg/dL (ref 6–20)
CO2: 23 mmol/L (ref 22–32)
CREATININE: 1.08 mg/dL — AB (ref 0.44–1.00)
Calcium: 8.9 mg/dL (ref 8.9–10.3)
Chloride: 99 mmol/L — ABNORMAL LOW (ref 101–111)
GFR calc Af Amer: 52 mL/min — ABNORMAL LOW (ref >60)
GFR, EST NON AFRICAN AMERICAN: 45 mL/min — AB (ref >60)
GLUCOSE: 153 mg/dL — AB (ref 65–99)
Potassium: 4.3 mmol/L (ref 3.5–5.1)
SODIUM: 131 mmol/L — AB (ref 135–145)

## 2017-05-25 LAB — COPPER, SERUM: Copper: 123 ug/dL (ref 72–166)

## 2017-05-25 MED ORDER — FLUTICASONE PROPIONATE 50 MCG/ACT NA SUSP
2.0000 | Freq: Every day | NASAL | Status: DC
  Administered 2017-05-25 – 2017-05-26 (×2): 2 via NASAL
  Filled 2017-05-25: qty 16

## 2017-05-25 MED ORDER — GUAIFENESIN ER 600 MG PO TB12
600.0000 mg | ORAL_TABLET | Freq: Two times a day (BID) | ORAL | Status: DC | PRN
  Administered 2017-05-25: 600 mg via ORAL
  Filled 2017-05-25: qty 1

## 2017-05-25 MED ORDER — MORPHINE SULFATE (PF) 4 MG/ML IV SOLN
1.0000 mg | INTRAVENOUS | Status: DC | PRN
  Administered 2017-05-25: 1 mg via INTRAVENOUS
  Filled 2017-05-25: qty 1

## 2017-05-25 MED FILL — Thrombin For Soln 5000 Unit: CUTANEOUS | Qty: 5000 | Status: AC

## 2017-05-25 NOTE — Transfer of Care (Signed)
Immediate Anesthesia Transfer of Care Note  Patient: [name]  Procedure(s) Performed: ANTERIOR CERVICAL DECOMPRESSION/DISCECTOMY FUSION CERVICAL THREE-FOUR, CERVICAL FIVE-SIX, CERVICAL SIX-SEVEN (N/A Spine Cervical)  Patient Location: PACU  Anesthesia Type:General  Level of Consciousness: awake, oriented, drowsy and patient cooperative  Airway & Oxygen Therapy: Patient Spontanous Breathing  Post-op Assessment: Report given to RN and Post -op Vital signs reviewed and stable  Post vital signs: Reviewed and stable  Last Vitals:  Vitals:   05/24/17 2350 05/25/17 0349  BP: 135/68 (!) 148/68  Pulse: 71 80  Resp: 12 15  Temp: 36.6 C 36.7 C  SpO2: 98% 98%    Last Pain:  Vitals:   05/25/17 1020  TempSrc:   PainSc: 3       Patients Stated Pain Goal: 0 (05/24/17 0500)  Complications: No apparent anesthesia complications

## 2017-05-25 NOTE — Consult Note (Addendum)
Physical Medicine and Rehabilitation Consult  Reason for Consult: Cervical myelopathy Referring Physician: Dr. Jordan Likes    HPI: [name] is a 83 y.o. female with history of HTN, CKD, CAF- on Xarelto , DDD with chronic back pain who was admitted on 05/22/17 with 2 week history of progressive right sided weakness and numbness with loss of use of right hand, right foot drop and loss of dexterity left hand.  History taken from chart review and daughter. MRI brain reviewed, unremarkable for acute process. Per report, without acute infarct and MRI spine done revealing severe cervical spondylosis with diffuse canal stenosis C3/4, C5/6 and C6/7, severe cord compression C3/4,  R> L reactive edema due to facet arthritis most prevalent at C5/6, multilevel compressive disc bulge throughout thoracic spine and multilevel change lumbar spine L2-L5 with severe spinal stenosis L4/5. She was evaluated by Dr. Jordan Likes who recommended cervical decompression due to myelopathy and she was cleared for surgery by Dr. Graciela Husbands. She underwent cervical decompression C3- C7 with ACDF on 05/24/17. Therapy ongoing and patient has had decline in functional status in last 3-4 weeks affecting mobility and ability to carry out ADLs. CIR recommended by MD.   Review of Systems  HENT: Positive for hearing loss.   Eyes: Positive for blurred vision (right eye). Negative for double vision.  Respiratory: Negative for cough and shortness of breath.   Cardiovascular: Negative for chest pain and palpitations.  Gastrointestinal: Negative for heartburn and nausea.  Genitourinary: Negative for dysuria and urgency.  Musculoskeletal: Positive for falls (Right knee instability for past month/ fall X 1), joint pain, myalgias and neck pain.  Skin: Negative for rash.  Neurological: Positive for sensory change, focal weakness, weakness and headaches. Negative for dizziness.  Psychiatric/Behavioral: Negative for memory loss. The patient is not  nervous/anxious.   All other systems reviewed and are negative.   Past Medical History:  Diagnosis Date  . Arthritis   . Chronic kidney disease   . Depression   . Dysrhythmia 8/13   palpitations/ now resolved  . GERD (gastroesophageal reflux disease)   . History of blood transfusion 2012  . Hypertension   . Peripheral vascular disease (HCC)    varicose veins bilaterally   . Restless legs syndrome      Past Surgical History:  Procedure Laterality Date  . ABDOMINAL HYSTERECTOMY    . APPENDECTOMY    . EYE SURGERY Bilateral    cataract extraction with IOL  . JOINT REPLACEMENT Right    knee  . SEPTOPLASTY     repair deviated septum  . VARICOSE VEIN SURGERY Bilateral      Family History  Problem Relation Age of Onset  . Heart attack Father   . Stroke Father   . Hypertension Father   . COPD Mother   . Lung cancer Sister   . Uterine cancer Sister   . Colon polyps Daughter   . Colon cancer Unknown      Social History:  Lives alone. Worked in a grocery store till age 80. Independent without AD till a month ago. Her family has been helping out in the past month --checking in and with meals. Her  daughter is a Engineer, civil (consulting) at [employer] and reports that family can provide 1?-2 weeks of supervision after discharge.  She reports that  has never smoked. she has never used smokeless tobacco. She reports that she does not drink alcohol or use drugs.     Allergies  Allergen Reactions  .  Hctz [Hydrochlorothiazide]     Dizziness   . Lasix [Furosemide]     fatigue   . Penicillins Hives and Itching  . Tramadol-Acetaminophen     GI upset     Medications Prior to Admission  Medication Sig Dispense Refill  . albuterol (VENTOLIN HFA) 108 (90 Base) MCG/ACT inhaler Inhale 2 puffs daily as needed into the lungs for wheezing or shortness of breath.     . ALPRAZolam (XANAX) 0.25 MG tablet Take 0.25 mg at bedtime as needed by mouth for anxiety or sleep.     Marland Kitchen. amiodarone (PACERONE) 200 MG tablet  Take 200 mg at bedtime by mouth.     Marland Kitchen. amLODipine (NORVASC) 5 MG tablet Take 2.5 mg daily after breakfast by mouth.     . bisacodyl (DULCOLAX) 10 MG suppository Place 10 mg daily rectally.    . cetirizine (ZYRTEC) 10 MG tablet Take 10 mg by mouth daily.    Marland Kitchen. conjugated estrogens (PREMARIN) vaginal cream Place 1 application at bedtime as needed vaginally.    . Cyanocobalamin (VITAMIN B-12 IJ) Inject 1 each every 30 (thirty) days as directed.    Marland Kitchen. estradiol (ESTRACE) 0.5 MG tablet Take 0.5 mg daily by mouth.    . fluticasone (FLONASE) 50 MCG/ACT nasal spray Place 1 spray daily as needed into both nostrils for allergies.     . metoprolol succinate (TOPROL-XL) 100 MG 24 hr tablet Take 100 mg 2 (two) times daily by mouth.     . pantoprazole (PROTONIX) 40 MG tablet Take 40 mg by mouth daily.    Marland Kitchen. rOPINIRole (REQUIP) 0.5 MG tablet Take 1 mg by mouth at bedtime.    Carlena Hurl. XARELTO 15 MG TABS tablet Take 15 mg daily by mouth.     Marland Kitchen. acebutolol (SECTRAL) 400 MG capsule Take 400 mg by mouth daily after breakfast.     . acetaminophen (TYLENOL) 500 MG tablet Take 500 mg by mouth every 6 (six) hours as needed for pain.    Marland Kitchen. aspirin EC 81 MG EC tablet Take 1 tablet (81 mg total) by mouth daily. 30 tablet 0  . estradiol (ESTRACE) 1 MG tablet Take 1 mg by mouth daily.    Marland Kitchen. gabapentin (NEURONTIN) 300 MG capsule Take 600 mg by mouth daily.    Marland Kitchen. gabapentin (NEURONTIN) 300 MG capsule Take 1 capsule (300 mg total) by mouth 2 (two) times daily. 90 capsule 6  . Omeprazole-Sodium Bicarbonate (ZEGERID) 20-1100 MG CAPS Take 1 capsule by mouth at bedtime.       Home: Home Living Family/patient expects to be discharged to:: Private residence Living Arrangements: Alone Available Help at Discharge: Family, Other (Comment)(going to talk to family about 24/7 assist) Type of Home: House Home Access: Ramped entrance Home Layout: One level(with basement) Bathroom Shower/Tub: Tub only, Health visitorWalk-in shower Bathroom Toilet:  Standard Home Equipment: Environmental consultantWalker - 2 wheels, Environmental consultantWalker - 4 wheels, Deercroftane - single point, Government social research officerToilet riser  Lives With: Alone  Functional History: Prior Function Level of Independence: Independent with assistive device(s) Comments: pt reported that she ambulates with RW Functional Status:  Mobility: Bed Mobility Overal bed mobility: Needs Assistance Bed Mobility: Supine to Sit, Sit to Supine Supine to sit: Supervision Sit to supine: Supervision General bed mobility comments: sitting EOB on arrival to pts room Transfers Overall transfer level: Needs assistance Equipment used: Rolling walker (2 wheeled) Transfers: Sit to/from Stand Sit to Stand: Min guard Stand pivot transfers: Min guard General transfer comment: Min guard for safety.  Ambulation/Gait Ambulation/Gait  assistance: Min Environmental consultant (Feet): 12 Feet Assistive device: Rolling walker (2 wheeled) Gait Pattern/deviations: Step-through pattern, Decreased step length - right, Decreased step length - left, Decreased stride length, Decreased dorsiflexion - right(R foot drop) General Gait Details: 12 feet in room. pt with genu recurvatum on R LE during stance phase. Unwilling to ambulate farther secondary to fear of falling.  Gait velocity: decreased Gait velocity interpretation: Below normal speed for age/gender    ADL: ADL Overall ADL's : Needs assistance/impaired Eating/Feeding: Bed level, Set up Grooming: Wash/dry face, Set up, Sitting Lower Body Dressing: Moderate assistance, Sit to/from stand Toilet Transfer: Minimal assistance, Stand-pivot, RW, BSC Toilet Transfer Details (indicate cue type and reason): assist for sit to stand transfer Toileting- Clothing Manipulation and Hygiene: Sit to/from stand, Minimal assistance Functional mobility during ADLs: Minimal assistance, Rolling walker General ADL Comments: Focused session on R hand FM and theraputty excercises  Cognition: Cognition Overall Cognitive Status:  Within Functional Limits for tasks assessed Arousal/Alertness: Awake/alert Orientation Level: Oriented to person, Oriented to place, Oriented to time, Oriented to situation Memory: Impaired Memory Impairment: Retrieval deficit Awareness: Appears intact Problem Solving: Appears intact Safety/Judgment: Appears intact Cognition Arousal/Alertness: Awake/alert Behavior During Therapy: WFL for tasks assessed/performed Overall Cognitive Status: Within Functional Limits for tasks assessed General Comments: Slower processing and increased VCs for follow activity instructions. Feel this is close to baseline  Blood pressure (!) 148/68, pulse 80, temperature 98.1 F (36.7 C), temperature source Oral, resp. rate 15, height 5\' 7"  (1.702 m), weight 84.7 kg (186 lb 12.8 oz), SpO2 98 %. Physical Exam  Nursing note and vitals reviewed. Constitutional: She is oriented to person, place, and time. She appears well-developed and well-nourished. No distress.  HENT:  Head: Normocephalic and atraumatic.  Mouth/Throat: Oropharynx is clear and moist.  Eyes: Conjunctivae and EOM are normal. Pupils are equal, round, and reactive to light.  Neck: Normal range of motion. Neck supple.  Cardiovascular: Normal rate and regular rhythm.  Murmur heard. Respiratory: Effort normal and breath sounds normal. No respiratory distress. She has no wheezes.  +[place]  GI: Soft. Bowel sounds are normal. There is no tenderness.  Musculoskeletal: She exhibits no edema or tenderness.  Neurological: She is alert and oriented to person, place, and time.  HOH.  Speech clear.  Able to follow basic commands.  Motor: 4/5 throughout Sensation diminished to light touch b/l feet  Skin: Skin is warm and dry. She is not diaphoretic.  Psychiatric: She has a normal mood and affect. Her behavior is normal. Thought content normal.    Results for orders placed or performed during the hospital encounter of 05/21/17 (from the past 24 hour(s))  CBC      Status: None   Collection Time: 05/25/17  5:52 AM  Result Value Ref Range   WBC 7.9 4.0 - 10.5 K/uL   RBC 4.18 3.87 - 5.11 MIL/uL   Hemoglobin 13.1 12.0 - 15.0 g/dL   HCT 16.1 09.6 - 04.5 %   MCV 91.9 78.0 - 100.0 fL   MCH 31.3 26.0 - 34.0 pg   MCHC 34.1 30.0 - 36.0 g/dL   RDW 40.9 81.1 - 91.4 %   Platelets 197 150 - 400 K/uL  Basic metabolic panel     Status: Abnormal   Collection Time: 05/25/17  5:52 AM  Result Value Ref Range   Sodium 131 (L) 135 - 145 mmol/L   Potassium 4.3 3.5 - 5.1 mmol/L   Chloride 99 (L) 101 - 111 mmol/L  CO2 23 22 - 32 mmol/L   Glucose, Bld 153 (H) 65 - 99 mg/dL   BUN 20 6 - 20 mg/dL   Creatinine, Ser 4.09 (H) 0.44 - 1.00 mg/dL   Calcium 8.9 8.9 - 81.1 mg/dL   GFR calc non Af Amer 45 (L) >60 mL/min   GFR calc Af Amer 52 (L) >60 mL/min   Anion gap 9 5 - 15   Dg Cervical Spine 1 View  Result Date: 05/24/2017 CLINICAL DATA:  Intraoperative fluoroscopy provided for anterior cervical decompression and fusion. EXAM: CERVICAL SPINE 1 VIEW COMPARISON:  None. FINDINGS: Initial image shows placement of a surgical needle superimpose over the C3-C4 disc. Followup image shows placement of anterior fusion plate spanning B1-Y7 and spanning C5 through C7, with radiolucent disc spacers at C3-C4, C5-C6 and C6-C7. The orthopedic hardware appears well seated and aligned. Mature bony fusion is seen between the C4 and C5 vertebra. IMPRESSION: Fluoroscopic imaging provided for anterior cervical disc fusion, C3-C4 and C5 through C7. Electronically Signed   By: Amie Portland M.D.   On: 05/24/2017 20:36   Dg C-arm 1-60 Min  Result Date: 05/24/2017 CLINICAL DATA:  Intraoperative fluoroscopy provided for anterior cervical decompression and fusion. EXAM: CERVICAL SPINE 1 VIEW COMPARISON:  None. FINDINGS: Initial image shows placement of a surgical needle superimpose over the C3-C4 disc. Followup image shows placement of anterior fusion plate spanning W2-N5 and spanning C5 through  C7, with radiolucent disc spacers at C3-C4, C5-C6 and C6-C7. The orthopedic hardware appears well seated and aligned. Mature bony fusion is seen between the C4 and C5 vertebra. IMPRESSION: Fluoroscopic imaging provided for anterior cervical disc fusion, C3-C4 and C5 through C7. Electronically Signed   By: Amie Portland M.D.   On: 05/24/2017 20:36    Assessment/Plan: Diagnosis: Cervical myelopathy s/p fusion Labs and images independently reviewed.  Records reviewed and summated above.  1. Does the need for close, 24 hr/day medical supervision in concert with the patient's rehab needs make it unreasonable for this patient to be served in a less intensive setting? Yes  2. Co-Morbidities requiring supervision/potential complications: HTN (monitor and provide prns in accordance with increased physical exertion and pain), CKD (avoid nephrotoxic meds), CAF (cont meds), DDD with post-op pain (Biofeedback training with therapies to help reduce reliance on opiate pain medications, monitor pain control during therapies, and sedation at rest and titrate to maximum efficacy to ensure participation and gains in therapies), hyponatremia (cont to monitor, treat if necessary) 3. Due to safety, skin/wound care, disease management, pain management and patient education, does the patient require 24 hr/day rehab nursing? Yes 4. Does the patient require coordinated care of a physician, rehab nurse, PT (1-2 hrs/day, 5 days/week), OT (1-2 hrs/day, 5 days/week) and SLP (1-2 hrs/day, 5 days/week) to address physical and functional deficits in the context of the above medical diagnosis(es)? Yes Addressing deficits in the following areas: balance, endurance, locomotion, strength, transferring, bathing, dressing, feeding, grooming, toileting, speech, swallowing and psychosocial support 5. Can the patient actively participate in an intensive therapy program of at least 3 hrs of therapy per day at least 5 days per week?  Potentially 6. The potential for patient to make measurable gains while on inpatient rehab is excellent 7. Anticipated functional outcomes upon discharge from inpatient rehab are supervision  with PT, supervision with OT, modified independent with SLP. 8. Estimated rehab length of stay to reach the above functional goals is: 10-14 days. 9. Anticipated D/C setting: Home 10. Anticipated post D/C  treatments: HH therapy and Home excercise program 11. Overall Rehab/Functional Prognosis: good  RECOMMENDATIONS: This patient's condition is appropriate for continued rehabilitative care in the following setting: Likely CIR.  Will need post-op reeval by therapies.  Will also await final plan regarding lumbar stenosis. Patient has agreed to participate in recommended program. Yes Note that insurance prior authorization may be required for reimbursement for recommended care.  Comment: Rehab Admissions Coordinator to follow up.  Maryla MorrowAnkit Lashawn Orrego, MD, ABPMR Jacquelynn CreeLove, Pamela S, [place] JerseyPA-C 05/25/2017

## 2017-05-25 NOTE — [place] FL2 (Signed)
[place] MED[place] FL2 LEVEL OF CARE SCREENING TOOL     IDENTIFICATION  Patient Name: [name] Birthdate: 11/25/1927 Sex: female Admission Date (Current Location): 05/21/2017  [facility]  [place]   Facility and Address:  [facility] [place], [address], [place], [place] [address]      Provider Number: [id number]  Attending Physician Name and Address:  Noralee StainChoi, Jennifer Chahn-Yan*  Relative Name and Phone Number:  Chelsea AusWanda Shelton, daughter, [phone]    Current Level of Care: SNF Recommended Level of Care: Skilled Nursing Facility Prior Approval Number:    Date Approved/Denied:   PASRR Number: [id number]  Discharge Plan: SNF    Current Diagnoses: Patient Active Problem List   Diagnosis Date Noted  . Cervical stenosis of spine 05/24/2017  . Cervical myelopathy (HCC) 05/24/2017  . AF (paroxysmal atrial fibrillation) (HCC) 05/24/2017  . Chronic diastolic CHF (congestive heart failure) (HCC) 05/24/2017  . CKD (chronic kidney disease) stage 3, GFR 30-59 ml/min (HCC) 05/24/2017  . Essential hypertension 05/24/2017  . Right sided weakness 05/22/2017  . Neuropathic pain 05/26/2013  . Restless leg syndrome 05/26/2013  . OA (osteoarthritis) of knee 11/07/2012    Orientation RESPIRATION BLADDER Height & Weight     Self, Time, Situation, Place  O2(nasal canula 2L) Continent Weight: 186 lb 12.8 oz (84.7 kg) Height:  5\' 7"  (170.2 cm)  BEHAVIORAL SYMPTOMS/MOOD NEUROLOGICAL BOWEL NUTRITION STATUS      Continent Diet  AMBULATORY STATUS COMMUNICATION OF NEEDS Skin   Limited Assist Verbally Surgical wounds                       Personal Care Assistance Level of Assistance  Bathing, Feeding, Dressing Bathing Assistance: Limited assistance Feeding assistance: Limited assistance Dressing Assistance: Limited assistance     Functional Limitations Info             SPECIAL CARE FACTORS FREQUENCY  OT (By licensed OT), PT (By  licensed PT)     PT Frequency: 5x week OT Frequency: 5x week            Contractures      Additional Factors Info  Allergies, Code Status Code Status Info: DNR Allergies Info: HCTZ HYDROCHLOROTHIAZIDE, LASIX FUROSEMIDE, PENICILLINS, TRAMADOL-ACETAMINOPHEN            Current Medications (05/25/2017):  This is the current hospital active medication list Current Facility-Administered Medications  Medication Dose Route Frequency Provider Last Rate Last Dose  . albuterol (PROVENTIL) (2.5 MG/3ML) 0.083% nebulizer solution 2.5 mg  2.5 mg Inhalation Daily PRN Carron CurieHijazi, Ali, MD      . ALPRAZolam Prudy Feeler(XANAX) tablet 0.25 mg  0.25 mg Oral QHS PRN Carron CurieHijazi, Ali, MD      . amiodarone (PACERONE) tablet 200 mg  200 mg Oral QHS Carron CurieHijazi, Ali, MD   200 mg at 05/24/17 2215  . amLODipine (NORVASC) tablet 2.5 mg  2.5 mg Oral QPC breakfast Carron CurieHijazi, Ali, MD   2.5 mg at 05/25/17 1008  . bisacodyl (DULCOLAX) suppository 10 mg  10 mg Rectal Daily Carron CurieHijazi, Ali, MD   10 mg at 05/23/17 1232  . ceFAZolin (ANCEF) IVPB 1 g/50 mL premix  1 g Intravenous Fredricka BonineQ8H Pool, Henry, MD   Stopped at 05/25/17 0700  . fentaNYL (SUBLIMAZE) injection 25-50 mcg  25-50 mcg Intravenous Q5 min PRN Dorris SinghGreen, Charlene, MD      . fluticasone (FLONASE) 50 MCG/ACT nasal spray 2 spray  2 spray Each Nare Daily Noralee Stainhoi, Jennifer  Chahn-Yang, DO   2 spray at 05/25/17 1011  . guaiFENesin (MUCINEX) 12 hr tablet 600 mg  600 mg Oral BID PRN Noralee StainChoi, Jennifer Chahn-Yang, DO   600 mg at 05/25/17 1006  . loratadine (CLARITIN) tablet 10 mg  10 mg Oral Daily Carron CurieHijazi, Ali, MD   10 mg at 05/25/17 1007  . LORazepam (ATIVAN) injection 0.5 mg  0.5 mg Intravenous PRN Cardama, Amadeo GarnetPedro Eduardo, MD   0.5 mg at 05/21/17 1930  . metoprolol succinate (TOPROL-XL) 24 hr tablet 25 mg  25 mg Oral BID Duke SalviaKlein, Steven C, MD   25 mg at 05/25/17 1007  . oxyCODONE (Oxy IR/ROXICODONE) immediate release tablet 5 mg  5 mg Oral Q4H PRN Noralee Stainhoi, Jennifer Chahn-Yang, DO   5 mg at 05/25/17 1048  .  pantoprazole (PROTONIX) EC tablet 40 mg  40 mg Oral Daily Carron CurieHijazi, Ali, MD   40 mg at 05/25/17 1006  . rOPINIRole (REQUIP) tablet 1 mg  1 mg Oral QHS Carron CurieHijazi, Ali, MD   1 mg at 05/24/17 2215  . senna-docusate (Senokot-S) tablet 1 tablet  1 tablet Oral QHS PRN Carron CurieHijazi, Ali, MD         Discharge Medications: Please see discharge summary for a list of discharge medications.  Relevant Imaging Results:  Relevant Lab Results:   Additional Information SSN # [id number]  [name], [place]

## 2017-05-25 NOTE — Evaluation (Signed)
Physical Therapy Re-Evaluation Patient Details Name: [name] MRN: [id number] DOB: 01-07-1928 Today's Date: 05/25/2017   History of Present Illness  83 y.o. female now s/p ANTERIOR CERVICAL DECOMPRESSION/DISCECTOMY FUSION C 3-4, C 5-6, and C 6-7 on 05/24/17.  PMH includes A-Fib, arthritis, depression, TKA chronic kidney disease, dysrhythmia, HTN, GERD, peripheral vascular disease.     Clinical Impression  Patient re-evaluated post op from above listed surgery resulting in functional limitations due to the deficits listed below (see PT Problem List). PTA, pt was modified independent with all functional mobility. Today pt presenting with R sided weakness in both upper and lower extremity that is limiting her mobility. Pt is currently min A with bed mobility, transfers, and ambulation. Session focused on introducing UE and LE exercises, transfer and gait training. Given patients mobility at baseline, current presentation, motivation, and family support I believe pt would progress extremely well with CIR.  Patient will benefit from skilled PT to increase their independence and safety with mobility to allow discharge to the venue listed below.       Follow Up Recommendations CIR    Equipment Recommendations  None recommended by PT    Recommendations for Other Services       Precautions / Restrictions Precautions Precautions: Fall Restrictions Weight Bearing Restrictions: No      Mobility  Bed Mobility Overal bed mobility: Needs Assistance Bed Mobility: Supine to Sit     Supine to sit: Min guard     General bed mobility comments: Supine to sit with   Transfers Overall transfer level: Needs assistance Equipment used: Rolling walker (2 wheeled) Transfers: Sit to/from Stand Sit to Stand: Min assist Stand pivot transfers: Min assist       General transfer comment: Min A for safety.   Ambulation/Gait Ambulation/Gait assistance: Min assist Ambulation Distance (Feet):  15 Feet Assistive device: Rolling walker (2 wheeled) Gait Pattern/deviations: Step-through pattern;Decreased step length - right;Decreased step length - left;Decreased stride length;Decreased dorsiflexion - right Gait velocity: dereased   General Gait Details: Min A with cues for sequencing and safety with RW. Pt fatigued from using commode and tolerating sitting EOB for 20+ minutes with rehab PA screening. Struggles with RLE limb advancement.    Stairs            Wheelchair Mobility    Modified Rankin (Stroke Patients Only)       Balance Overall balance assessment: Needs assistance Sitting-balance support: Feet supported Sitting balance-Leahy Scale: Good Sitting balance - Comments: Sat EOB and on bedside commode for 20+ minutes.    Standing balance support: During functional activity;Bilateral upper extremity supported Standing balance-Leahy Scale: Poor Standing balance comment: heavy reliance on UE suport for static and dynamic balance.                              Pertinent Vitals/Pain Pain Assessment: No/denies pain    Home Living Family/patient expects to be discharged to:: Private residence Living Arrangements: Alone Available Help at Discharge: Family;Other (Comment) Type of Home: House Home Access: Ramped entrance     Home Layout: One level Home Equipment: Walker - 2 wheels;Walker - 4 wheels;Cane - single point;Toilet riser Additional Comments: Pt can arrange 2-4 weeks of assistance at home    Prior Function Level of Independence: Independent               Hand Dominance        Extremity/Trunk Assessment   Upper  Extremity Assessment Upper Extremity Assessment: Defer to OT evaluation    Lower Extremity Assessment Lower Extremity Assessment: LLE deficits/detail;RLE deficits/detail(BLE sensiation to light touch intact ) RLE Deficits / Details: 3/5 flexion, 3+/5 knee extension, 3/5 dorsiflexion.  LLE Deficits / Details: LLE strength  4-/5 globally.        Communication   Communication: No difficulties  Cognition Arousal/Alertness: Awake/alert Behavior During Therapy: WFL for tasks assessed/performed Overall Cognitive Status: Within Functional Limits for tasks assessed                                        General Comments General comments (skin integrity, edema, etc.): VSS throughout session. Daugther and Granddaughter present in room.    Exercises General Exercises - Lower Extremity Long Arc Quad: AROM;Right;10 reps;Seated Heel Slides: AROM;Right;10 reps;Seated Hand Exercises Forearm Supination: AROM;Right;10 reps Forearm Pronation: AROM;Both;10 reps Wrist Flexion: AROM;Both;10 reps Wrist Extension: AROM;Both;10 reps Wrist Ulnar Deviation: AROM;Both;10 reps Wrist Radial Deviation: AROM;Both;10 reps Digit Composite Flexion: AROM;10 reps;Seated;Right Composite Extension: AROM;Right;10 reps;Seated Digit Composite Abduction: AROM;Right;10 reps Digit Composite Adduction: AROM;Right;10 reps Other Exercises Other Exercises: PIP/DIP/MCP flexion/extension x10 R AROM.    Assessment/Plan    PT Assessment Patient needs continued PT services  PT Problem List Decreased strength;Decreased activity tolerance;Decreased range of motion;Decreased balance;Decreased knowledge of use of DME;Pain       PT Treatment Interventions      PT Goals (Current goals can be found in the Care Plan section)  Acute Rehab PT Goals Patient Stated Goal: return to PLOF, go to CIR PT Goal Formulation: With patient Time For Goal Achievement: 05/29/17 Potential to Achieve Goals: Good    Frequency Min 5X/week   Barriers to discharge        Co-evaluation               AM-PAC PT "6 Clicks" Daily Activity  Outcome Measure Difficulty turning over in bed (including adjusting bedclothes, sheets and blankets)?: None Difficulty moving from lying on back to sitting on the side of the bed? : None Difficulty sitting  down on and standing up from a chair with arms (e.g., wheelchair, bedside commode, etc,.)?: A Little Help needed moving to and from a bed to chair (including a wheelchair)?: A Little Help needed walking in hospital room?: A Little Help needed climbing 3-5 steps with a railing? : A Lot 6 Click Score: 19    End of Session Equipment Utilized During Treatment: Gait belt;Oxygen Activity Tolerance: Patient tolerated treatment well Patient left: in chair;with call bell/phone within reach;with family/visitor present Nurse Communication: Mobility status PT Visit Diagnosis: Unsteadiness on feet (R26.81);Other abnormalities of gait and mobility (R26.89)    Time: 1200-1300 PT Time Calculation (min) (ACUTE ONLY): 60 min   Charges:   PT Evaluation $PT Eval Moderate Complexity: 1 Mod PT Treatments $Gait Training: 8-22 mins $Therapeutic Exercise: 8-22 mins $Therapeutic Activity: 8-22 mins   PT G Codes:        Etta GrandchildSean Joanny Dupree, PT, DPT Acute Rehab Services Pager: [phone]    Etta GrandchildSean  Leianne Callins 05/25/2017, 1:46 PM

## 2017-05-25 NOTE — Progress Notes (Signed)
I met with pt and her daughter, Mariann Laster, at bedside. They prefer an inpt rehab admission rather than SNF if insurance approves. Family will provide 24/7 supervision at d/c form CIR. I spoke with PT and OT and will begin insurance authorization after postop evaluations completed today. I will follow up tomorrow. [phone]

## 2017-05-25 NOTE — Progress Notes (Signed)
PROGRESS NOTE    [name]  WUJ:[id number] DOB: 11/21/1927 DOA: 05/21/2017 PCP: Lenell AntuLe, Thao P, DO     Brief Narrative:  DorothyBrownis a83 y.o.female,with past medical history significant for chronic kidney disease ,osteoarthritis,arthritis of the spine and paroxysmal atrial fibrillation on Xareltopresenting with 2 weekshistory of right-sided lower extremity numbness and weakness. She received steroid spinal injection by Dr. Ethelene Halamos (orthopedic surgery) couple of weeks ago without improvement in right sciatica. She has now developed worsening right lower extremity weakness and presents to the hospital.   Neurology as well as neurosurgery consulted on patient.  She underwent C3-4, C5-6, C6-7 anterior cervical discectomy with interbody fusion on 11/12 with Dr. Jordan LikesPool.  Assessment & Plan:   Principal Problem:   Cervical stenosis of spine Active Problems:   Right sided weakness   Cervical myelopathy (HCC)   AF (paroxysmal atrial fibrillation) (HCC)   Chronic diastolic CHF (congestive heart failure) (HCC)   CKD (chronic kidney disease) stage 3, GFR 30-59 ml/min (HCC)   Essential hypertension   DDD (degenerative disc disease), cervical   Post-operative pain   Chronic a-fib (HCC)   Hyponatremia  Right sided weakness secondary to severe cervical stenosis with myelopathy  -MRI brain negative for stroke -MRI spine revealed severe cervical spondylolysis with resultant severe diffuse canal stenosis, most severe at C3-4, C5-6, and C6-7. Multilevel facet arthropathy within the lower thoracic spine most prevalent at T11-12. Multilevel degenerative disc disease and facet arthrosis with resulting canal stenosis at L2-3 through L4-5. Changes most pronounced at the L4-5 level were there is severe spinal stenosis -Serum B12 normal. Serum copper within normal limit  -Neurology and neurosurgery consulted -S/p C3-4, C5-6, C6-7 anterior cervical discectomy with interbody fusion on 11/12 with Dr.  Jordan LikesPool -CIR evaluation   Paroxysmal A fib -Sinus rhythm currently -Holding xarelto. Resume when okay with neurosurgery  -Continue amiodarone, toprol  Chronic diastolic HF -Stable, euvolemic   CKD stage 3 -Baseline Cr 1 -Stable, monitor   HTN -Continue norvasc, toprol    DVT prophylaxis: SCD Code Status: DNR Family Communication: Daughter at bedside Disposition Plan: CIR evaluation    Consultants:   Neurology  Neurosurgery  Cardiology for surgical clearance   Procedures:   None  Antimicrobials:  Anti-infectives (From admission, onward)   Start     Dose/Rate Route Frequency Ordered Stop   05/24/17 2300  ceFAZolin (ANCEF) IVPB 1 g/50 mL premix     1 g 100 mL/hr over 30 Minutes Intravenous Every 8 hours 05/24/17 2050 05/25/17 2259   05/24/17 1721  bacitracin 50,000 Units in sodium chloride irrigation 0.9 % 500 mL irrigation  Status:  Discontinued       As needed 05/24/17 1721 05/24/17 1850   05/24/17 0600  ceFAZolin (ANCEF) IVPB 2g/100 mL premix     2 g 200 mL/hr over 30 Minutes Intravenous On call to O.R. 05/23/17 0832 05/24/17 1635       Subjective: Patient doing well postoperatively.  Right-sided strength has been slowly returning.  Working with physical therapy now.  Some complaints of sinus congestion.  Objective: Vitals:   05/24/17 2046 05/24/17 2200 05/24/17 2350 05/25/17 0349  BP: (!) 147/72  135/68 (!) 148/68  Pulse:   71 80  Resp: 18  12 15   Temp: (!) 97.5 F (36.4 C)  97.8 F (36.6 C) 98.1 F (36.7 C)  TempSrc: Axillary  Oral Oral  SpO2:  97% 98% 98%  Weight:      Height:  Intake/Output Summary (Last 24 hours) at 05/25/2017 1404 Last data filed at 05/24/2017 1845 Gross per 24 hour  Intake -  Output 800 ml  Net -800 ml   Filed Weights   05/22/17 0200  Weight: 84.7 kg (186 lb 12.8 oz)    Examination:  General exam: Appears calm and comfortable  Respiratory system: Clear to auscultation. Respiratory effort  normal. Cardiovascular system: S1 & S2 heard, RRR. No JVD, murmurs, rubs, gallops or clicks. No pedal edema. Gastrointestinal system: Abdomen is nondistended, soft and nontender. No organomegaly or masses felt. Normal bowel sounds heard. Central nervous system: Alert and oriented. CN 2-12 grossly in tact, RLE and RUE weaker compared to left but improved from previous examination  Extremities: Symmetric in appearance  Skin: No rashes, lesions or ulcers Psychiatry: Judgement and insight appear normal. Mood & affect appropriate.   Data Reviewed: I have personally reviewed following labs and imaging studies  CBC: Recent Labs  Lab 05/21/17 1055 05/22/17 0620 05/24/17 0524 05/25/17 0552  WBC 4.1 6.0 5.4 7.9  HGB 14.5 14.1 13.4 13.1  HCT 42.2 41.6 40.0 38.4  MCV 90.9 91.2 92.8 91.9  PLT 198 209 184 197   Basic Metabolic Panel: Recent Labs  Lab 05/21/17 1055 05/22/17 0620 05/23/17 0345 05/24/17 0524 05/25/17 0552  NA 133*  --  134* 134* 131*  K 4.1  --  4.0 3.9 4.3  CL 101  --  103 101 99*  CO2 24  --  24 26 23   GLUCOSE 111*  --  96 92 153*  BUN 14  --  24* 20 20  CREATININE 1.09* 1.24* 1.30* 1.21* 1.08*  CALCIUM 9.0  --  8.6* 8.7* 8.9   GFR: Estimated Creatinine Clearance: 41 mL/min (A) (by C-G formula based on SCr of 1.08 mg/dL (H)). Liver Function Tests: No results for input(s): AST, ALT, ALKPHOS, BILITOT, PROT, ALBUMIN in the last 168 hours. No results for input(s): LIPASE, AMYLASE in the last 168 hours. No results for input(s): AMMONIA in the last 168 hours. Coagulation Profile: No results for input(s): INR, PROTIME in the last 168 hours. Cardiac Enzymes: No results for input(s): CKTOTAL, CKMB, CKMBINDEX, TROPONINI in the last 168 hours. BNP (last 3 results) No results for input(s): PROBNP in the last 8760 hours. HbA1C: No results for input(s): HGBA1C in the last 72 hours. CBG: No results for input(s): GLUCAP in the last 168 hours. Lipid Profile: No results for  input(s): CHOL, HDL, LDLCALC, TRIG, CHOLHDL, LDLDIRECT in the last 72 hours. Thyroid Function Tests: No results for input(s): TSH, T4TOTAL, FREET4, T3FREE, THYROIDAB in the last 72 hours. Anemia Panel: Recent Labs    05/22/17 1459  VITAMINB12 874   Sepsis Labs: No results for input(s): PROCALCITON, LATICACIDVEN in the last 168 hours.  Recent Results (from the past 240 hour(s))  Surgical pcr screen     Status: None   Collection Time: 05/24/17  5:05 AM  Result Value Ref Range Status   MRSA, PCR NEGATIVE NEGATIVE Final   Staphylococcus aureus NEGATIVE NEGATIVE Final    Comment: (NOTE) The Xpert SA Assay (FDA approved for NASAL specimens in patients 81 years of age and older), is one component of a comprehensive surveillance program. It is not intended to diagnose infection nor to guide or monitor treatment.        Radiology Studies: Dg Cervical Spine 1 View  Result Date: 05/24/2017 CLINICAL DATA:  Intraoperative fluoroscopy provided for anterior cervical decompression and fusion. EXAM: CERVICAL SPINE 1 VIEW COMPARISON:  None. FINDINGS: Initial image shows placement of a surgical needle superimpose over the C3-C4 disc. Followup image shows placement of anterior fusion plate spanning F6-O1 and spanning C5 through C7, with radiolucent disc spacers at C3-C4, C5-C6 and C6-C7. The orthopedic hardware appears well seated and aligned. Mature bony fusion is seen between the C4 and C5 vertebra. IMPRESSION: Fluoroscopic imaging provided for anterior cervical disc fusion, C3-C4 and C5 through C7. Electronically Signed   By: Amie Portland M.D.   On: 05/24/2017 20:36   Dg C-arm 1-60 Min  Result Date: 05/24/2017 CLINICAL DATA:  Intraoperative fluoroscopy provided for anterior cervical decompression and fusion. EXAM: CERVICAL SPINE 1 VIEW COMPARISON:  None. FINDINGS: Initial image shows placement of a surgical needle superimpose over the C3-C4 disc. Followup image shows placement of anterior fusion  plate spanning H0-Q6 and spanning C5 through C7, with radiolucent disc spacers at C3-C4, C5-C6 and C6-C7. The orthopedic hardware appears well seated and aligned. Mature bony fusion is seen between the C4 and C5 vertebra. IMPRESSION: Fluoroscopic imaging provided for anterior cervical disc fusion, C3-C4 and C5 through C7. Electronically Signed   By: Amie Portland M.D.   On: 05/24/2017 20:36      Scheduled Meds: . amiodarone  200 mg Oral QHS  . amLODipine  2.5 mg Oral QPC breakfast  . bisacodyl  10 mg Rectal Daily  . fluticasone  2 spray Each Nare Daily  . loratadine  10 mg Oral Daily  . metoprolol succinate  25 mg Oral BID  . pantoprazole  40 mg Oral Daily  . rOPINIRole  1 mg Oral QHS   Continuous Infusions: .  ceFAZolin (ANCEF) IV Stopped (05/25/17 0700)     LOS: 3 days    Time spent: 30 minutes   Noralee Stain, DO [facility] [email or web address] Password [email or web address] 05/25/2017, 2:04 PM

## 2017-05-25 NOTE — Evaluation (Signed)
Occupational Therapy Evaluation Patient Details Name: Lisa GamblerDorothy W Reedy MRN: [id number] DOB: 01/14/1928 Today's Date: 05/25/2017    History of Present Illness 83 y.o. female now s/p ANTERIOR CERVICAL DECOMPRESSION/DISCECTOMY FUSION C 3-4, C 5-6, and C 6-7 on 05/24/17.  PMH includes A-Fib, arthritis, depression, TKA chronic kidney disease, dysrhythmia, HTN, GERD, peripheral vascular disease.   Clinical Impression   This 83 y/o presents with the above. At baseline Pt is mod independent with ADLs and functional mobility. Pt presents with decreased functional performance, generalized weakness, decreased RUE FMC, decreased dynamic balance. Pt completed short ambulation this session from recliner to EOB at RW level with MinA +2, increasing to ModA +2 when attempting to turn to transfer to EOB; currently requires Mod-MaxA for LB ADLs. Pt very motivated and willing to work and progress with therapy. Pt will benefit from continued acute OT services and feel Pt is appropriate for OT services in CIR level setting after discharge to maximize Pt's safety and independence with ADLs and mobility.     Follow Up Recommendations  CIR;Supervision/Assistance - 24 hour    Equipment Recommendations  Other (comment)(defer to next venue )           Precautions / Restrictions Precautions Precautions: Fall;Cervical Required Braces or Orthoses: Cervical Brace Cervical Brace: Soft collar Restrictions Weight Bearing Restrictions: No      Mobility Bed Mobility Overal bed mobility: Needs Assistance Bed Mobility: Sit to Supine     Supine to sit: Mod assist;+2 for physical assistance;+2 for safety/equipment     General bed mobility comments: assist to guide trunk and assist for bil LEs when returning to supine   Transfers Overall transfer level: Needs assistance Equipment used: Rolling walker (2 wheeled) Transfers: Sit to/from [employer]GI CorporationStand;Stand Pivot Transfers Sit to Stand: Mod assist;+2 safety/equipment;+2  physical assistance Stand pivot transfers: Min assist;+2 safety/equipment;+2 physical assistance;Mod assist       General transfer comment: ModA to boost into standing from recliner and steady initially; Pt demonstrating ataxic gait; Pt ambulating small distance from recliner to EOB, initially with MinA, increased to ModA as Pt attempting to sit prior to fully being in front of EOB, multimodal cues for managing RW     Balance Overall balance assessment: Needs assistance Sitting-balance support: Feet supported Sitting balance-Leahy Scale: Good Sitting balance - Comments: static sitting EOB    Standing balance support: During functional activity;Bilateral upper extremity supported Standing balance-Leahy Scale: Poor Standing balance comment: heavy reliance on UE suport for static and dynamic balance.                            ADL either performed or assessed with clinical judgement   ADL Overall ADL's : Needs assistance/impaired Eating/Feeding: Set up;Sitting   Grooming: Set up;Sitting   Upper Body Bathing: Minimal assistance;Sitting   Lower Body Bathing: Moderate assistance;Sit to/from stand   Upper Body Dressing : Minimal assistance;Sitting   Lower Body Dressing: Maximal assistance;Sit to/from stand   Toilet Transfer: Minimal assistance;+2 for safety/equipment;+2 for physical assistance Toilet Transfer Details (indicate cue type and reason): simulated in chair to bed transfer  Toileting- Clothing Manipulation and Hygiene: Moderate assistance;Sit to/from stand       Functional mobility during ADLs: Minimal assistance;+2 for physical assistance;+2 for safety/equipment;Rolling walker                           Pertinent Vitals/Pain Pain Assessment: Faces Faces Pain Scale: Hurts a  little bit Pain Location: Low back Pain Descriptors / Indicators: Aching;Discomfort Pain Intervention(s): Monitored during session;Repositioned     Hand Dominance Right    Extremity/Trunk Assessment Upper Extremity Assessment Upper Extremity Assessment: Generalized weakness;RUE deficits/detail RUE Deficits / Details: unable to actively straighten digits, decreased fine motor and opposition  RUE Coordination: decreased fine motor;decreased gross motor LUE Deficits / Details: generalized weakness    Lower Extremity Assessment Lower Extremity Assessment: Defer to PT evaluation RLE Deficits / Details: 3/5 flexion, 3+/5 knee extension, 3/5 dorsiflexion.  LLE Deficits / Details: LLE strength 4-/5 globally.        Communication Communication Communication: No difficulties   Cognition Arousal/Alertness: Awake/alert Behavior During Therapy: WFL for tasks assessed/performed Overall Cognitive Status: Within Functional Limits for tasks assessed                                 General Comments: some decreased safety awareness, requires VCs   General Comments  VSS throughout session. Daugther present in room.              Home Living Family/patient expects to be discharged to:: Private residence Living Arrangements: Alone Available Help at Discharge: Family;Other (Comment) Type of Home: House Home Access: Ramped entrance     Home Layout: One level     Bathroom Shower/Tub: Tub only;Walk-in shower   Bathroom Toilet: Standard     Home Equipment: Environmental consultant - 2 wheels;Walker - 4 wheels;Cane - single point;Toilet riser   Additional Comments: Pt can arrange 2-4 weeks of assistance at home  Lives With: Alone    Prior Functioning/Environment Level of Independence: Independent        Comments: pt reported that she ambulates with RW        OT Problem List: Decreased strength;Decreased range of motion;Impaired UE functional use;Decreased knowledge of precautions;Decreased knowledge of use of DME or AE;Decreased coordination;Impaired balance (sitting and/or standing);Decreased activity tolerance;Decreased safety awareness      OT  Treatment/Interventions: Self-care/ADL training;DME and/or AE instruction;Therapeutic activities;Patient/family education;Balance training;Therapeutic exercise;Energy conservation    OT Goals(Current goals can be found in the care plan section) Acute Rehab OT Goals Patient Stated Goal: return to PLOF, go to CIR OT Goal Formulation: With patient Time For Goal Achievement: 06/08/17 Potential to Achieve Goals: Good  OT Frequency: Min 2X/week                             AM-PAC PT "6 Clicks" Daily Activity     Outcome Measure Help from another person eating meals?: A Little Help from another person taking care of personal grooming?: A Little Help from another person toileting, which includes using toliet, bedpan, or urinal?: A Little Help from another person bathing (including washing, rinsing, drying)?: A Lot Help from another person to put on and taking off regular upper body clothing?: A Little Help from another person to put on and taking off regular lower body clothing?: A Lot 6 Click Score: 16   End of Session Equipment Utilized During Treatment: Gait belt;Rolling walker Nurse Communication: Mobility status  Activity Tolerance: Patient tolerated treatment well Patient left: in bed;with call bell/phone within reach;with bed alarm set;with family/visitor present  OT Visit Diagnosis: Unsteadiness on feet (R26.81);Muscle weakness (generalized) (M62.81);Other abnormalities of gait and mobility (R26.89)                Time: 1610-9604 OT Time Calculation (min): 25  min Charges:  OT General Charges $OT Visit: 1 Visit OT Evaluation $OT Eval Moderate Complexity: 1 Mod G-Codes:     Marcy SirenBreanna Benard Minturn, OT Pager [phone] 05/25/2017   Orlando PennerBreanna L Iram Astorino 05/25/2017, 3:55 PM

## 2017-05-25 NOTE — Progress Notes (Signed)
Postop day 1. Patient notes some moderatethroat soreness and some dysphagia. Minimal posterior cervical pain.minimal lower back pain. No shortness of breath.  Afebrile. Vital signs are stable. Labs Good.awake and alert. Oriented and appropriate. Motor examination significantly improved. Right biceps 4 minus over 5 right wrist extensor 4/5 right triceps 4 minus over 5 grip 3/5 intrinsic 3/5 left upper extremity strength norm 4+ over 5. Patient with return in dorsiflexion function Wound healing well. Superficial ecchymosis present but Chest and abdomen benign.  Patient doing well following three-level anterior cervical decompression and fusion Mobilize with therapy. Rehabilitation consult.

## 2017-05-25 NOTE — Clinical Social Work Note (Signed)
Clinical Social Work Assessment  Patient Details  Name: [name] Puzzo MRN: [id number] Date of Birth: 11/23/1927  Date of referral:  05/25/17               Reason for consult:  Facility Placement                Permission sought to share information with:  Oceanographeracility Contact Representative Permission granted to share information::  Yes, Verbal Permission Granted  Name::     Chelsea AusWanda Shelton  Agency::  SNF  Relationship::  daughter  Contact Information:     Housing/Transportation Living arrangements for the past 2 months:  Single Family Home Source of Information:  Patient, Adult Children Patient Interpreter Needed:  None Criminal Activity/Legal Involvement Pertinent to Current Situation/Hospitalization:  No - Comment as needed Significant Relationships:  Adult Children, Other Family Members Lives with:  Self Do you feel safe going back to the place where you live?  No Need for family participation in patient care:  Yes (Comment)  Care giving concerns:  Pt from home alone, however, the family lives on the land and the daughter has a home in back, the granddaughter has a place next door as well. Pt has good family support, however everyone works and cannot be at home with patient during the day. In addition, clinical team is recommending SNF placement. Pt and family in agreement with SNF placement.  Social Worker assessment / plan:  CSW discussed role and the SNF plan and options with patient, daughter and grand daughter at bedside. Pt and family would like CIR however, CSW explained that therapist recommending SNF placement. Family would like [facility] as that is close to home and/or [place] CSW will send out offers and follow up for SNF offers for placement.   Employment status:  Retired Database administratornsurance information:  Managed Medicare PT Recommendations:  Skilled Nursing Facility Information / Referral to community resources:   Skilled Nursing Facility  Patient/Family's Response to care:  Patent attorneyatient/family appreciative of CSW f/u to discuss SNF placement. No issues or concerns.  Patient/Family's Understanding of and Emotional Response to Diagnosis, Current Treatment, and Prognosis:  Patient/family has good understanding of diagnosis, current treatment and prognosis. Pt and family hopeful that patient will return to baseline and independence with short term rehab. No issues or concerns identified at this time.  Emotional Assessment Appearance:  Appears stated age Attitude/Demeanor/Rapport:  (Cooperative) Affect (typically observed):  Accepting, Appropriate Orientation:  Oriented to Self, Oriented to Place, Oriented to  Time, Oriented to Situation Alcohol / Substance use:  Not Applicable Psych involvement (Current and /or in the community):  No (Comment)  Discharge Needs  Concerns to be addressed:  Care Coordination Readmission within the last 30 days:  No Current discharge risk:  Dependent with Mobility, Physical Impairment Barriers to Discharge:  No Barriers Identified   Tresa Mooreatricia V Tiron Suski, LCSW 05/25/2017, 12:05 PM

## 2017-05-25 NOTE — Anesthesia Postprocedure Evaluation (Signed)
Anesthesia Post Note  Patient: Sherwood GamblerDorothy W Beightol  Procedure(s) Performed: ANTERIOR CERVICAL DECOMPRESSION/DISCECTOMY FUSION CERVICAL THREE-FOUR, CERVICAL FIVE-SIX, CERVICAL SIX-SEVEN (N/A Spine Cervical)     Patient location during evaluation: PACU Anesthesia Type: General Level of consciousness: awake and alert Pain management: pain level controlled Vital Signs Assessment: post-procedure vital signs reviewed and stable Respiratory status: spontaneous breathing, nonlabored ventilation, respiratory function stable and patient connected to nasal cannula oxygen Cardiovascular status: blood pressure returned to baseline and stable Postop Assessment: no apparent nausea or vomiting Anesthetic complications: no    Last Vitals:  Vitals:   05/24/17 2046 05/24/17 2200  BP: (!) 147/72   Pulse:    Resp: 18   Temp: (!) 36.4 C   SpO2:  97%    Last Pain:  Vitals:   05/24/17 2200  TempSrc:   PainSc: 5                  Ritesh Opara

## 2017-05-26 ENCOUNTER — Other Ambulatory Visit: Payer: Self-pay

## 2017-05-26 ENCOUNTER — Inpatient Hospital Stay (HOSPITAL_COMMUNITY)
Admission: RE | Admit: 2017-05-26 | Discharge: 2017-06-07 | DRG: 560 | Disposition: A | Discharge status: Home Health | Payer: Medicare Other | Source: Intra-hospital | Attending: Physical Medicine & Rehabilitation | Admitting: Physical Medicine & Rehabilitation

## 2017-05-26 ENCOUNTER — Encounter (HOSPITAL_COMMUNITY): Payer: Self-pay | Admitting: Physical Medicine and Rehabilitation

## 2017-05-26 ENCOUNTER — Encounter (HOSPITAL_COMMUNITY): Payer: Self-pay | Admitting: *Deleted

## 2017-05-26 DIAGNOSIS — G2581 Restless legs syndrome: Secondary | ICD-10-CM

## 2017-05-26 DIAGNOSIS — Z8249 Family history of ischemic heart disease and other diseases of the circulatory system: Secondary | ICD-10-CM

## 2017-05-26 DIAGNOSIS — I8393 Asymptomatic varicose veins of bilateral lower extremities: Secondary | ICD-10-CM | POA: Diagnosis present

## 2017-05-26 DIAGNOSIS — R471 Dysarthria and anarthria: Secondary | ICD-10-CM | POA: Diagnosis present

## 2017-05-26 DIAGNOSIS — N183 Chronic kidney disease, stage 3 (moderate): Secondary | ICD-10-CM | POA: Diagnosis present

## 2017-05-26 DIAGNOSIS — R1319 Other dysphagia: Secondary | ICD-10-CM | POA: Diagnosis not present

## 2017-05-26 DIAGNOSIS — I480 Paroxysmal atrial fibrillation: Secondary | ICD-10-CM

## 2017-05-26 DIAGNOSIS — I129 Hypertensive chronic kidney disease with stage 1 through stage 4 chronic kidney disease, or unspecified chronic kidney disease: Secondary | ICD-10-CM | POA: Diagnosis present

## 2017-05-26 DIAGNOSIS — Z7901 Long term (current) use of anticoagulants: Secondary | ICD-10-CM

## 2017-05-26 DIAGNOSIS — K219 Gastro-esophageal reflux disease without esophagitis: Secondary | ICD-10-CM

## 2017-05-26 DIAGNOSIS — Z961 Presence of intraocular lens: Secondary | ICD-10-CM | POA: Diagnosis present

## 2017-05-26 DIAGNOSIS — I10 Essential (primary) hypertension: Secondary | ICD-10-CM

## 2017-05-26 DIAGNOSIS — Z96651 Presence of right artificial knee joint: Secondary | ICD-10-CM | POA: Diagnosis present

## 2017-05-26 DIAGNOSIS — N3946 Mixed incontinence: Secondary | ICD-10-CM | POA: Diagnosis present

## 2017-05-26 DIAGNOSIS — G959 Disease of spinal cord, unspecified: Secondary | ICD-10-CM | POA: Diagnosis not present

## 2017-05-26 DIAGNOSIS — G8191 Hemiplegia, unspecified affecting right dominant side: Secondary | ICD-10-CM | POA: Diagnosis not present

## 2017-05-26 DIAGNOSIS — H9190 Unspecified hearing loss, unspecified ear: Secondary | ICD-10-CM | POA: Diagnosis present

## 2017-05-26 DIAGNOSIS — R2689 Other abnormalities of gait and mobility: Secondary | ICD-10-CM | POA: Diagnosis present

## 2017-05-26 DIAGNOSIS — I739 Peripheral vascular disease, unspecified: Secondary | ICD-10-CM | POA: Diagnosis present

## 2017-05-26 DIAGNOSIS — K5903 Drug induced constipation: Secondary | ICD-10-CM

## 2017-05-26 DIAGNOSIS — R1313 Dysphagia, pharyngeal phase: Secondary | ICD-10-CM | POA: Diagnosis not present

## 2017-05-26 DIAGNOSIS — K649 Unspecified hemorrhoids: Secondary | ICD-10-CM | POA: Diagnosis not present

## 2017-05-26 DIAGNOSIS — Z4789 Encounter for other orthopedic aftercare: Principal | ICD-10-CM

## 2017-05-26 DIAGNOSIS — Z79899 Other long term (current) drug therapy: Secondary | ICD-10-CM

## 2017-05-26 DIAGNOSIS — M5440 Lumbago with sciatica, unspecified side: Secondary | ICD-10-CM | POA: Diagnosis present

## 2017-05-26 DIAGNOSIS — F329 Major depressive disorder, single episode, unspecified: Secondary | ICD-10-CM | POA: Diagnosis present

## 2017-05-26 DIAGNOSIS — Z885 Allergy status to narcotic agent status: Secondary | ICD-10-CM

## 2017-05-26 DIAGNOSIS — G8929 Other chronic pain: Secondary | ICD-10-CM | POA: Diagnosis present

## 2017-05-26 DIAGNOSIS — R062 Wheezing: Secondary | ICD-10-CM | POA: Diagnosis not present

## 2017-05-26 DIAGNOSIS — K5909 Other constipation: Secondary | ICD-10-CM | POA: Diagnosis not present

## 2017-05-26 DIAGNOSIS — J302 Other seasonal allergic rhinitis: Secondary | ICD-10-CM | POA: Diagnosis present

## 2017-05-26 DIAGNOSIS — R609 Edema, unspecified: Secondary | ICD-10-CM | POA: Diagnosis not present

## 2017-05-26 DIAGNOSIS — Z90710 Acquired absence of both cervix and uterus: Secondary | ICD-10-CM

## 2017-05-26 DIAGNOSIS — R269 Unspecified abnormalities of gait and mobility: Secondary | ICD-10-CM | POA: Diagnosis not present

## 2017-05-26 DIAGNOSIS — Z8049 Family history of malignant neoplasm of other genital organs: Secondary | ICD-10-CM

## 2017-05-26 DIAGNOSIS — Z7982 Long term (current) use of aspirin: Secondary | ICD-10-CM

## 2017-05-26 DIAGNOSIS — E871 Hypo-osmolality and hyponatremia: Secondary | ICD-10-CM | POA: Diagnosis present

## 2017-05-26 DIAGNOSIS — Z7951 Long term (current) use of inhaled steroids: Secondary | ICD-10-CM

## 2017-05-26 DIAGNOSIS — G9520 Unspecified cord compression: Secondary | ICD-10-CM | POA: Diagnosis not present

## 2017-05-26 DIAGNOSIS — Z981 Arthrodesis status: Secondary | ICD-10-CM

## 2017-05-26 DIAGNOSIS — Z888 Allergy status to other drugs, medicaments and biological substances status: Secondary | ICD-10-CM

## 2017-05-26 DIAGNOSIS — M5441 Lumbago with sciatica, right side: Secondary | ICD-10-CM | POA: Diagnosis not present

## 2017-05-26 DIAGNOSIS — Z880 Allergy status to penicillin: Secondary | ICD-10-CM

## 2017-05-26 LAB — CBC
HCT: 35.5 % — ABNORMAL LOW (ref 36.0–46.0)
Hemoglobin: 11.8 g/dL — ABNORMAL LOW (ref 12.0–15.0)
MCH: 30.6 pg (ref 26.0–34.0)
MCHC: 33.2 g/dL (ref 30.0–36.0)
MCV: 92.2 fL (ref 78.0–100.0)
PLATELETS: 161 10*3/uL (ref 150–400)
RBC: 3.85 MIL/uL — AB (ref 3.87–5.11)
RDW: 15.4 % (ref 11.5–15.5)
WBC: 8.7 10*3/uL (ref 4.0–10.5)

## 2017-05-26 LAB — BASIC METABOLIC PANEL
Anion gap: 6 (ref 5–15)
BUN: 32 mg/dL — AB (ref 6–20)
CHLORIDE: 100 mmol/L — AB (ref 101–111)
CO2: 27 mmol/L (ref 22–32)
CREATININE: 1.22 mg/dL — AB (ref 0.44–1.00)
Calcium: 8.6 mg/dL — ABNORMAL LOW (ref 8.9–10.3)
GFR calc non Af Amer: 39 mL/min — ABNORMAL LOW (ref >60)
GFR, EST AFRICAN AMERICAN: 45 mL/min — AB (ref >60)
GLUCOSE: 107 mg/dL — AB (ref 65–99)
Potassium: 4.7 mmol/L (ref 3.5–5.1)
Sodium: 133 mmol/L — ABNORMAL LOW (ref 135–145)

## 2017-05-26 MED ORDER — DICLOFENAC SODIUM 1 % TD GEL
2.0000 g | Freq: Four times a day (QID) | TRANSDERMAL | Status: DC
  Administered 2017-05-26 – 2017-06-07 (×41): 2 g via TOPICAL
  Filled 2017-05-26 (×2): qty 100

## 2017-05-26 MED ORDER — AMLODIPINE BESYLATE 2.5 MG PO TABS
2.5000 mg | ORAL_TABLET | Freq: Every day | ORAL | Status: DC
  Administered 2017-05-27 – 2017-06-07 (×12): 2.5 mg via ORAL
  Filled 2017-05-26 (×12): qty 1

## 2017-05-26 MED ORDER — LORATADINE 10 MG PO TABS
10.0000 mg | ORAL_TABLET | Freq: Every day | ORAL | Status: DC
  Administered 2017-05-27 – 2017-06-07 (×12): 10 mg via ORAL
  Filled 2017-05-26 (×12): qty 1

## 2017-05-26 MED ORDER — PANTOPRAZOLE SODIUM 40 MG PO TBEC
40.0000 mg | DELAYED_RELEASE_TABLET | Freq: Every day | ORAL | Status: DC
  Administered 2017-05-27 – 2017-05-30 (×4): 40 mg via ORAL
  Filled 2017-05-26 (×4): qty 1

## 2017-05-26 MED ORDER — ALPRAZOLAM 0.25 MG PO TABS
0.2500 mg | ORAL_TABLET | Freq: Every evening | ORAL | Status: DC | PRN
  Administered 2017-06-02 – 2017-06-06 (×6): 0.25 mg via ORAL
  Filled 2017-05-26 (×6): qty 1

## 2017-05-26 MED ORDER — GUAIFENESIN ER 600 MG PO TB12
600.0000 mg | ORAL_TABLET | Freq: Two times a day (BID) | ORAL | Status: DC | PRN
  Administered 2017-05-26 – 2017-06-03 (×5): 600 mg via ORAL
  Filled 2017-05-26 (×4): qty 1

## 2017-05-26 MED ORDER — PREMIER PROTEIN SHAKE
2.0000 [oz_av] | Freq: Four times a day (QID) | ORAL | Status: DC
  Administered 2017-05-26 – 2017-05-30 (×7): 2 [oz_av] via ORAL
  Filled 2017-05-26 (×60): qty 325.31

## 2017-05-26 MED ORDER — ALBUTEROL SULFATE (2.5 MG/3ML) 0.083% IN NEBU: 2.5000 mg | INHALATION_SOLUTION | Freq: Every day | RESPIRATORY_TRACT | Status: DC | PRN

## 2017-05-26 MED ORDER — METOPROLOL SUCCINATE ER 25 MG PO TB24
25.0000 mg | ORAL_TABLET | Freq: Two times a day (BID) | ORAL | Status: DC
  Administered 2017-05-26 – 2017-06-07 (×24): 25 mg via ORAL
  Filled 2017-05-26 (×24): qty 1

## 2017-05-26 MED ORDER — BISACODYL 10 MG RE SUPP
10.0000 mg | Freq: Every day | RECTAL | Status: DC
  Administered 2017-05-27 – 2017-06-07 (×10): 10 mg via RECTAL
  Filled 2017-05-26 (×12): qty 1

## 2017-05-26 MED ORDER — FLEET ENEMA 7-19 GM/118ML RE ENEM
1.0000 | ENEMA | Freq: Once | RECTAL | Status: DC
  Filled 2017-05-26: qty 1

## 2017-05-26 MED ORDER — FLUTICASONE PROPIONATE 50 MCG/ACT NA SUSP
2.0000 | Freq: Every day | NASAL | Status: DC
  Administered 2017-05-27 – 2017-06-07 (×11): 2 via NASAL
  Filled 2017-05-26: qty 16

## 2017-05-26 MED ORDER — AMIODARONE HCL 200 MG PO TABS
200.0000 mg | ORAL_TABLET | Freq: Every day | ORAL | Status: DC
  Administered 2017-05-26 – 2017-06-06 (×12): 200 mg via ORAL
  Filled 2017-05-26 (×12): qty 1

## 2017-05-26 MED ORDER — ROPINIROLE HCL 1 MG PO TABS
1.0000 mg | ORAL_TABLET | Freq: Every day | ORAL | Status: DC
  Administered 2017-05-26 – 2017-06-06 (×12): 1 mg via ORAL
  Filled 2017-05-26 (×14): qty 1

## 2017-05-26 MED ORDER — OXYCODONE HCL 5 MG PO TABS
5.0000 mg | ORAL_TABLET | ORAL | Status: DC | PRN
  Administered 2017-05-26 – 2017-06-03 (×9): 5 mg via ORAL
  Filled 2017-05-26 (×12): qty 1

## 2017-05-26 MED ORDER — POLYETHYLENE GLYCOL 3350 17 G PO PACK
17.0000 g | PACK | Freq: Every day | ORAL | Status: DC
  Administered 2017-05-26 – 2017-05-29 (×3): 17 g via ORAL
  Filled 2017-05-26 (×5): qty 1

## 2017-05-26 NOTE — Progress Notes (Signed)
Deleted  Date of Service:  05/26/2017 2:57 PM          I          View without strikethrough          [] Hide copied text  [] Hover for details   PMR Admission Coordinator Pre-Admission Assessment  Patient:Lisa W Brownis an 83 y.o.,female WGN:[id number] DOB:04-21-1928 Height:5\' 7"  (170.2 cm) Weight:84.7 kg (186 lb 12.8 oz)  Insurance Information HQI:ONGEXB: PCP: IPA:80/20: OTHER:Medicare advantage plan PRIMARY:[employer] MedicarePolicy#:[id number] Subscriber:pt CM Name:John MWUXLKGMWNUU#:[phone]#:[phone] Pre-Cert#:[id number] approved for 7 days with updates due to [name] phone [phone] faxEmployer:retired Benefits: Phone #:[phone]:05/26/17 Eff. Date:1/1/2018Deduct:noneOut of Pocket Max:$4400Life UKG:URKY CIR:$345 co pay per day days 1 to 5SNF:no co pay days 1-20; $160 co pay days 21-48; no co pay days 49-100 Outpatient:$40 co pay per visitCo-Pay:Visits per medical neccesity Home Health:100%Co-Pay:visits per medical neccesity DME:80%Co-Pay:20% Providers:in network  SECONDARY:none  Medicaid Application Date:Case Manager: Disability Application Date:Case Worker:  Emergency Actuary Information   Name Relation Home Work [place] Daughter [phone]  [phone]   walker,donna Daughter   [phone]     Current Medical History Patient Admitting Diagnosis:cervical myelopathy s/p fusion  History of Present Illness: [name] a 83 y.o.femalewith history of HTN, CKD,PAF- on Xareltoand amiodarone, DDD with chronic back pain who was admitted on 05/22/17 with 2 week history of progressive right sided weakness and numbness with loss of use of right hand, right foot drop and  loss of dexterity left hand.Marland KitchenMRI brainreviewed, unremarkable for acute process. Per report,without acute infarct and MRI spine done revealing severe cervical spondylosis with diffuse canal stenosis C3/4, C5/6 and C6/7, severe cord compression C3/4, R>L reactive edema due to facet arthritis most prevalent at C5/6, multilevel compressive disc bulge throughout thoracic spine and multilevel change lumbar spine L2-L5 with severe spinal stenosis L4/5.   She was evaluated by Dr. Jordan Likes who recommended cervical decompression due to myelopathy and she was cleared for surgery by Dr. Graciela Husbands. She underwent cervical decompression C3- C7 with ACDF on 05/24/17. Therapy ongoing and patient has had decline in functional status in last 3-4 weeks affecting mobility and ability to carry out ADLs.    Past Medical History     Past Medical History:  Diagnosis Date  . Arthritis   . Chronic kidney disease   . Depression   . Dysrhythmia 8/13   palpitations/ now resolved  . GERD (gastroesophageal reflux disease)   . History of blood transfusion 2012  . Hypertension   . Peripheral vascular disease (HCC)    varicose veins bilaterally   . Restless legs syndrome     Family History family history includes COPD in her mother; Colon cancer in her unknown relative; Colon polyps in her daughter; Heart attack in her father; Hypertension in her father; Lung cancer in her sister; Stroke in her father; Uterine cancer in her sister.  Prior Rehab/Hospitalizations: Has the patient had major surgery during 100 days prior to admission?No  Current Medications  Current Facility-Administered Medications:  . albuterol (PROVENTIL) (2.5 MG/3ML) 0.083% nebulizer solution 2.5 mg, 2.5 mg, Inhalation, Daily PRN, Carron Curie, MD . ALPRAZolam Prudy Feeler) tablet 0.25 mg, 0.25 mg, Oral, QHS PRN, Carron Curie, MD, 0.25 mg at 05/25/17 2137 . amiodarone (PACERONE) tablet 200 mg, 200 mg, Oral, QHS, Carron Curie, MD,  200 mg at 05/25/17 2137 . amLODipine (NORVASC) tablet 2.5 mg, 2.5 mg, Oral, QPC breakfast, Carron Curie, MD, 2.5 mg at 05/26/17 0829 .  bisacodyl (DULCOLAX) suppository 10 mg, 10 mg, Rectal, Daily, Carron CurieHijazi, Ali, MD, 10 mg at 05/26/17 1433 . fluticasone (FLONASE) 50 MCG/ACT nasal spray 2 spray, 2 spray, Each Nare, Daily, Noralee Stainhoi, Jennifer, DO, 2 spray at 05/26/17 0829 . guaiFENesin (MUCINEX) 12 hr tablet 600 mg, 600 mg, Oral, BID PRN, Noralee Stainhoi, Jennifer, DO, 600 mg at 05/25/17 1006 . loratadine (CLARITIN) tablet 10 mg, 10 mg, Oral, Daily, Carron CurieHijazi, Ali, MD, 10 mg at 05/26/17 0829 . LORazepam (ATIVAN) injection 0.5 mg, 0.5 mg, Intravenous, PRN, Cardama, Amadeo GarnetPedro Eduardo, MD, 0.5 mg at 05/21/17 1930 . metoprolol succinate (TOPROL-XL) 24 hr tablet 25 mg, 25 mg, Oral, BID, Duke SalviaKlein, Steven C, MD, 25 mg at 05/26/17 0829 . morphine 4 MG/ML injection 1 mg, 1 mg, Intravenous, Q4H PRN, Noralee Stainhoi, Jennifer, DO, 1 mg at 05/25/17 1706 . oxyCODONE (Oxy IR/ROXICODONE) immediate release tablet 5 mg, 5 mg, Oral, Q4H PRN, Noralee Stainhoi, Jennifer, DO, 5 mg at 05/26/17 0828 . pantoprazole (PROTONIX) EC tablet 40 mg, 40 mg, Oral, Daily, Carron CurieHijazi, Ali, MD, 40 mg at 05/26/17 0828 . rOPINIRole (REQUIP) tablet 1 mg, 1 mg, Oral, QHS, Carron CurieHijazi, Ali, MD, 1 mg at 05/25/17 2136 . senna-docusate (Senokot-S) tablet 1 tablet, 1 tablet, Oral, QHS PRN, Carron CurieHijazi, Ali, MD  Patients Current Diet:Diet Heart Room service appropriate? Yes; Fluid consistency: Thin  Precautions / Restrictions Precautions Precautions: Fall, Cervical Cervical Brace: Soft collar Restrictions Weight Bearing Restrictions: No  Has the patient had 2 or more falls or a fall with injury in the past year?No  Prior Activity Level Community (5-7x/wk): Mod I with RW pta; drove  Journalist, newspaperHome Assistive Devices / Equipment Home Assistive Devices/Equipment: Environmental consultantWalker (specify type), Wheelchair Home Equipment: Environmental consultantWalker - 2 wheels, Walker - 4 wheels, [place] - single point, Toilet riser  Prior  Device Use: Indicate devices/aids used by the patient prior to current illness, exacerbation or injury?Walker  Prior Functional Level Prior Function Level of Independence: Independent Comments: pt reported that she ambulates with RW  Self Care: Did the patient need help bathing, dressing, using the toilet or eating? Independent  Indoor Mobility: Did the patient need assistance with walking from room to room (with or without device)?Independent  Stairs: Did the patient need assistance with internal or external stairs (with or without device)?Independent  Functional Cognition: Did the patient need help planning regular tasks such as shopping or remembering to take medications?Independent  Current Functional Level Cognition  Arousal/Alertness: Awake/alert Overall Cognitive Status: Within Functional Limits for tasks assessed Orientation Level: Oriented X4 General Comments: some decreased safety awareness, requires VCs Memory: Impaired Memory Impairment: Retrieval deficit Awareness: Appears intact Problem Solving: Appears intact Safety/Judgment: Appears intact   Extremity Assessment (includes Sensation/Coordination)  Upper Extremity Assessment: Generalized weakness, RUE deficits/detail RUE Deficits / Details: unable to actively straighten digits, decreased fine motor and opposition  RUE Coordination: decreased fine motor, decreased gross motor LUE Deficits / Details: generalized weakness  Lower Extremity Assessment: Defer to PT evaluation RLE Deficits / Details: 3/5 flexion, 3+/5 knee extension, 3/5 dorsiflexion.  LLE Deficits / Details: LLE strength 4-/5 globally.   ADLs  Overall ADL's : Needs assistance/impaired Eating/Feeding: Set up, Sitting Grooming: Set up, Sitting Upper Body Bathing: Minimal assistance, Sitting Lower Body Bathing: Moderate assistance, Sit to/from stand Upper Body Dressing : Minimal assistance, Sitting Lower Body Dressing: Maximal  assistance, Sit to/from stand Toilet Transfer: Minimal assistance, +2 for safety/equipment, +2 for physical assistance Toilet Transfer Details (indicate cue type and reason): simulated in chair to bed transfer  Toileting- Clothing Manipulation and Hygiene: Moderate  assistance, Sit to/from stand Functional mobility during ADLs: Minimal assistance, +2 for physical assistance, +2 for safety/equipment, Rolling walker General ADL Comments: Focused session on R hand FM and theraputty excercises   Mobility  Overal bed mobility: Needs Assistance Bed Mobility: Sit to Supine Supine to sit: Min assist Sit to supine: Supervision General bed mobility comments: hand hel assist with min A for patient to guide trunk up from supine. Pt now able to advance LEs to EOB with physical assist.   Transfers  Overall transfer level: Needs assistance Equipment used: Rolling walker (2 wheeled) Transfers: Sit to/from Stand Sit to Stand: Mod assist Stand pivot transfers: Min assist, +2 safety/equipment, +2 physical assistance, Mod assist General transfer comment: Mod A to power up into standing from EOB and steady inititally. Pt requires verbal cues for safety when sitting down as she has a tendancy to want to sit before she has come close enough to surface. Cues for handplacment and safe use of RW during sit<>stand transfers.   Ambulation / Gait / Stairs / Wheelchair Mobility  Ambulation/Gait Ambulation/Gait assistance: Architect (Feet): 60 Feet Assistive device: Rolling walker (2 wheeled) Gait Pattern/deviations: Step-to pattern, Step-through pattern, Narrow base of support, Decreased step length - left General Gait Details: Min A for safety with cues for base of support, increasing L step length, can demonstrate intermittent step through gait with cues, but more often is using short step to gait at this time.  Gait velocity: dereased Gait velocity interpretation: Below normal speed  for age/gender   Posture / Balance Dynamic Sitting Balance Sitting balance - Comments: static sitting EOB  Balance Overall balance assessment: Needs assistance Sitting-balance support: Feet supported Sitting balance-Leahy Scale: Good Sitting balance - Comments: static sitting EOB  Standing balance support: During functional activity, Bilateral upper extremity supported Standing balance-Leahy Scale: Poor Standing balance comment: heavy reliance on UE suport for static and dynamic balance.   Special needs/care consideration BiPAP/CPAPN/a CPMN/a Continuous Drip IVN/a DialysisN/a Life VestN/a Oxygen2 liters [place] Special BedN/a Trach SizeN/a Wound Vacn/a Skinsurgical incision Bowel mgmt:continent LBM 11/14 Bladder mgmt:continent  Diabeticmgmt N/a   Previous Home Environment Living Arrangements: Alone Lives With: Alone Available Help at Discharge: Family, Available 24 hours/day(family can do 24/7) Type of Home: House Home Layout: One level Home Access: Ramped entrance Bathroom Shower/Tub: Tub only, Health visitor: Standard Bathroom Accessibility: Yes How Accessible: Accessible via walker Home Care Services: No Additional Comments: Pt can arrange 2-4 weeks of assistance at home  Discharge Living Setting Plans for Discharge Living Setting: Patient's home(family will rotate 24/7 supervision) Type of Home at Discharge: House Discharge Home Layout: One level Discharge Home Access: Ramped entrance Discharge Bathroom Shower/Tub: Walk-in shower(tub only) Discharge Bathroom Toilet: Standard Discharge Bathroom Accessibility: Yes How Accessible: Accessible via walker Does the patient have any problems obtaining your medications?: No  Social/Family/Support Systems Patient Roles: Parent Contact Information: Chelsea Aus, main daughter for contact Anticipated Caregiver: Burna Mortimer and other family members to help, Shanda Bumps is another family  member who pt relies on a lot Anticipated Caregiver's Contact Information: see above Ability/Limitations of Caregiver: no limitations short term Caregiver Availability: 24/7 Discharge Plan Discussed with Primary Caregiver: Yes Is Caregiver In Agreement with Plan?: Yes Does Caregiver/Family have Issues with Lodging/Transportation while Pt is in Rehab?: No(Family have been staying with pt 24/7 in hospital)  Goals/Additional Needs Patient/Family Goal for Rehab: supervision with PT and OT Expected length of stay: ELOS 10- 14 days Pt/Family Agrees to Admission and willing to participate:  Yes Program Orientation Provided &Reviewed with Pt/Caregiver Including Roles &Responsibilities: Yes  Decrease burden of Care through IP rehab admission:n/a  Possible need for SNF placement upon discharge:not anticipated  Patient Condition:This patient's condition remains as documented in the consult dated11/13/2018, in which the Rehabilitation Physician determined and documented that the patient's condition is appropriate for intensive rehabilitative care in an inpatient rehabilitation facility. Will admit to inpatient rehab today.  Preadmission Screen Completed ZO:XWRUEAVBy:Shatasia Cutshaw, Britta MccreedyBarbara Godwin,11/14/20182:58 PM ______________________________________________________________________  Discussed status with Dr.Patelon11/14/2018at1505and received telephone approval for admission today.  Admission Coordinator:Aracelys Glade, Foye SpurlingBarbara Godwin, time1505Date11/14/2018             Standley BrookingBoyette, Zeidy Tayag G, RN at 05/26/2017 3:25 PM    Chart Correction History                        Cosigned by: Marcello FennelPatel, Ankit Anil, MD at 05/26/2017 4:01 PM

## 2017-05-26 NOTE — Progress Notes (Signed)
Patient ID: [name], female   DOB: 11/25/1927, 83 y.o.   MRN: [id number] Patient arrived from 5N with RN, family and patient belongings. Patient and family oriented to room, rehab schedule, fall prevention plan, safety plan, and nurse call system. Patient resting in bed on her side with daughter at the bed side.

## 2017-05-26 NOTE — Progress Notes (Deleted)
Physical Medicine and Rehabilitation Consult  Reason for Consult: Cervical myelopathy Referring Physician: Dr. Jordan LikesPool    HPI: [name] is a 83 y.o. female with history of HTN, CKD, CAF- on Xarelto , DDD with chronic back pain who was admitted on 05/22/17 with 2 week history of progressive right sided weakness and numbness with loss of use of right hand, right foot drop and loss of dexterity left hand.  History taken from chart review and daughter. MRI brain reviewed, unremarkable for acute process. Per report, without acute infarct and MRI spine done revealing severe cervical spondylosis with diffuse canal stenosis C3/4, C5/6 and C6/7, severe cord compression C3/4,  R> L reactive edema due to facet arthritis most prevalent at C5/6, multilevel compressive disc bulge throughout thoracic spine and multilevel change lumbar spine L2-L5 with severe spinal stenosis L4/5. She was evaluated by Dr. Jordan LikesPool who recommended cervical decompression due to myelopathy and she was cleared for surgery by Dr. Graciela HusbandsKlein. She underwent cervical decompression C3- C7 with ACDF on 05/24/17. Therapy ongoing and patient has had decline in functional status in last 3-4 weeks affecting mobility and ability to carry out ADLs. CIR recommended by MD.   Review of Systems  HENT: Positive for hearing loss.   Eyes: Positive for blurred vision (right eye). Negative for double vision.  Respiratory: Negative for cough and shortness of breath.   Cardiovascular: Negative for chest pain and palpitations.  Gastrointestinal: Negative for heartburn and nausea.  Genitourinary: Negative for dysuria and urgency.  Musculoskeletal: Positive for falls (Right knee instability for past month/ fall X 1), joint pain, myalgias and neck pain.  Skin: Negative for rash.  Neurological: Positive for sensory change, focal weakness, weakness and headaches. Negative for dizziness.  Psychiatric/Behavioral: Negative for memory loss. The patient  is not nervous/anxious.   All other systems reviewed and are negative.       Past Medical History:  Diagnosis Date  . Arthritis   . Chronic kidney disease   . Depression   . Dysrhythmia 8/13   palpitations/ now resolved  . GERD (gastroesophageal reflux disease)   . History of blood transfusion 2012  . Hypertension   . Peripheral vascular disease (HCC)    varicose veins bilaterally   . Restless legs syndrome           Past Surgical History:  Procedure Laterality Date  . ABDOMINAL HYSTERECTOMY    . APPENDECTOMY    . EYE SURGERY Bilateral    cataract extraction with IOL  . JOINT REPLACEMENT Right    knee  . SEPTOPLASTY     repair deviated septum  . VARICOSE VEIN SURGERY Bilateral           Family History  Problem Relation Age of Onset  . Heart attack Father   . Stroke Father   . Hypertension Father   . COPD Mother   . Lung cancer Sister   . Uterine cancer Sister   . Colon polyps Daughter   . Colon cancer Unknown      Social History:  Lives alone. Worked in a grocery store till age 89. Independent without AD till a month ago. Her family has been helping out in the past month --checking in and with meals. Her  daughter is a Engineer, civil (consulting)nurse at [employer] and reports that family can provide 1?-2 weeks of supervision after discharge.  She reports that  has never smoked. she has never used smokeless tobacco. She reports that she does not  drink alcohol or use drugs.          Allergies  Allergen Reactions  . Hctz [Hydrochlorothiazide]     Dizziness   . Lasix [Furosemide]     fatigue   . Penicillins Hives and Itching  . Tramadol-Acetaminophen     GI upset           Medications Prior to Admission  Medication Sig Dispense Refill  . albuterol (VENTOLIN HFA) 108 (90 Base) MCG/ACT inhaler Inhale 2 puffs daily as needed into the lungs for wheezing or shortness of breath.     . ALPRAZolam (XANAX) 0.25 MG tablet Take 0.25 mg  at bedtime as needed by mouth for anxiety or sleep.     Marland Kitchen. amiodarone (PACERONE) 200 MG tablet Take 200 mg at bedtime by mouth.     Marland Kitchen. amLODipine (NORVASC) 5 MG tablet Take 2.5 mg daily after breakfast by mouth.     . bisacodyl (DULCOLAX) 10 MG suppository Place 10 mg daily rectally.    . cetirizine (ZYRTEC) 10 MG tablet Take 10 mg by mouth daily.    Marland Kitchen. conjugated estrogens (PREMARIN) vaginal cream Place 1 application at bedtime as needed vaginally.    . Cyanocobalamin (VITAMIN B-12 IJ) Inject 1 each every 30 (thirty) days as directed.    Marland Kitchen. estradiol (ESTRACE) 0.5 MG tablet Take 0.5 mg daily by mouth.    . fluticasone (FLONASE) 50 MCG/ACT nasal spray Place 1 spray daily as needed into both nostrils for allergies.     . metoprolol succinate (TOPROL-XL) 100 MG 24 hr tablet Take 100 mg 2 (two) times daily by mouth.     . pantoprazole (PROTONIX) 40 MG tablet Take 40 mg by mouth daily.    Marland Kitchen. rOPINIRole (REQUIP) 0.5 MG tablet Take 1 mg by mouth at bedtime.    Carlena Hurl. XARELTO 15 MG TABS tablet Take 15 mg daily by mouth.     Marland Kitchen. acebutolol (SECTRAL) 400 MG capsule Take 400 mg by mouth daily after breakfast.     . acetaminophen (TYLENOL) 500 MG tablet Take 500 mg by mouth every 6 (six) hours as needed for pain.    Marland Kitchen. aspirin EC 81 MG EC tablet Take 1 tablet (81 mg total) by mouth daily. 30 tablet 0  . estradiol (ESTRACE) 1 MG tablet Take 1 mg by mouth daily.    Marland Kitchen. gabapentin (NEURONTIN) 300 MG capsule Take 600 mg by mouth daily.    Marland Kitchen. gabapentin (NEURONTIN) 300 MG capsule Take 1 capsule (300 mg total) by mouth 2 (two) times daily. 90 capsule 6  . Omeprazole-Sodium Bicarbonate (ZEGERID) 20-1100 MG CAPS Take 1 capsule by mouth at bedtime.       Home: Home Living Family/patient expects to be discharged to:: Private residence Living Arrangements: Alone Available Help at Discharge: Family, Other (Comment)(going to talk to family about 24/7 assist) Type of Home: House Home Access:  Ramped entrance Home Layout: One level(with basement) Bathroom Shower/Tub: Tub only, Health visitorWalk-in shower Bathroom Toilet: Standard Home Equipment: Environmental consultantWalker - 2 wheels, Environmental consultantWalker - 4 wheels, [place] - single point, Government social research officerToilet riser  Lives With: Alone  Functional History: Prior Function Level of Independence: Independent with assistive device(s) Comments: pt reported that she ambulates with RW Functional Status:  Mobility: Bed Mobility Overal bed mobility: Needs Assistance Bed Mobility: Supine to Sit, Sit to Supine Supine to sit: Supervision Sit to supine: Supervision General bed mobility

## 2017-05-26 NOTE — PMR Pre-admission (Deleted)
PMR Admission Coordinator Pre-Admission Assessment  Patient: [name] is an 83 y.o., female MRN: [id number] DOB: 1928-05-23 Height: 5\' 7"  (170.2 cm) Weight: 84.7 kg (186 lb 12.8 oz)              Insurance Information HMO: yes    PPO:      PCP:      IPA:      80/20:      OTHER: Medicare advantage plan PRIMARY: [employer]      Policy#: [id number]      Subscriber: pt CM Name: Hart Robinsons      Phone#: [phone]     Fax#: [phone] Pre-Cert#: [id number] approved for 7 days with updates due to [name] Alert [phone] phone [phone] fax      Employer: retired Benefits:  Phone #: [phone]     Name: 05/26/17 Eff. Date: 07/13/2016     Deduct: none      Out of Pocket Max: $4400      Life Max: none CIR: $345 co pay per day days 1 to 5      SNF: no co pay days 1-20; $160 co pay days 21-48; no co pay days 49-100 Outpatient: $40 co pay per visit     Co-Pay:  Visits per medical neccesity Home Health: 100%      Co-Pay: visits per medical neccesity DME: 80%     Co-Pay: 20% Providers: in network  SECONDARY: none       Medicaid Application Date:       Case Manager:  Disability Application Date:       Case Worker:   Emergency Contact Information Contact Information    Name Relation Home Work Mobile   [place] Daughter [phone]  [phone]   walker,donna Daughter   [phone]     Current Medical History  Patient Admitting Diagnosis: cervical myelopathy s/p fusion  History of Present Illness: HPI:  [name] a 83 y.o.femalewith history of HTN, CKD, PAF- on Xarelto and amiodarone , DDD with chronic back pain who was admitted on 05/22/17 with 2 week history of progressive right sided weakness and numbness with loss of use of right hand, right foot drop and loss of dexterity left hand.Marland KitchenMRI brainreviewed, unremarkable for acute process. Per report,without acute infarct and MRI spine done revealing severe cervical spondylosis with diffuse canal  stenosis C3/4, C5/6 and C6/7, severe cord compression C3/4, R>L reactive edema due to facet arthritis most prevalent at C5/6, multilevel compressive disc bulge throughout thoracic spine and multilevel change lumbar spine L2-L5 with severe spinal stenosis L4/5.   She was evaluated by Dr. Jordan Likes who recommended cervical decompression due to myelopathy and she was cleared for surgery by Dr. Graciela Husbands. She underwent cervical decompression C3- C7 with ACDF on 05/24/17. Therapy ongoing and patient has had decline in functional status in last 3-4 weeks affecting mobility and ability to carry out ADLs.       Past Medical History  Past Medical History:  Diagnosis Date  . Arthritis   . Chronic kidney disease   . Depression   . Dysrhythmia 8/13   palpitations/ now resolved  . GERD (gastroesophageal reflux disease)   . History of blood transfusion 2012  . Hypertension   . Peripheral vascular disease (HCC)    varicose veins bilaterally   . Restless legs syndrome     Family History  family history includes COPD in her mother; Colon cancer in her unknown relative; Colon polyps in her daughter; Heart attack  in her father; Hypertension in her father; Lung cancer in her sister; Stroke in her father; Uterine cancer in her sister.  Prior Rehab/Hospitalizations:  Has the patient had major surgery during 100 days prior to admission? No  Current Medications   Current Facility-Administered Medications:  .  albuterol (PROVENTIL) (2.5 MG/3ML) 0.083% nebulizer solution 2.5 mg, 2.5 mg, Inhalation, Daily PRN, Carron CurieHijazi, Ali, MD .  ALPRAZolam Prudy Feeler(XANAX) tablet 0.25 mg, 0.25 mg, Oral, QHS PRN, Carron CurieHijazi, Ali, MD, 0.25 mg at 05/25/17 2137 .  amiodarone (PACERONE) tablet 200 mg, 200 mg, Oral, QHS, Carron CurieHijazi, Ali, MD, 200 mg at 05/25/17 2137 .  amLODipine (NORVASC) tablet 2.5 mg, 2.5 mg, Oral, QPC breakfast, Carron CurieHijazi, Ali, MD, 2.5 mg at 05/26/17 0829 .  bisacodyl (DULCOLAX) suppository 10 mg, 10 mg, Rectal, Daily, Carron CurieHijazi, Ali, MD,  10 mg at 05/26/17 1433 .  fluticasone (FLONASE) 50 MCG/ACT nasal spray 2 spray, 2 spray, Each Nare, Daily, Noralee Stainhoi, Jennifer, DO, 2 spray at 05/26/17 0829 .  guaiFENesin (MUCINEX) 12 hr tablet 600 mg, 600 mg, Oral, BID PRN, Noralee Stainhoi, Jennifer, DO, 600 mg at 05/25/17 1006 .  loratadine (CLARITIN) tablet 10 mg, 10 mg, Oral, Daily, Carron CurieHijazi, Ali, MD, 10 mg at 05/26/17 0829 .  LORazepam (ATIVAN) injection 0.5 mg, 0.5 mg, Intravenous, PRN, Cardama, Amadeo GarnetPedro Eduardo, MD, 0.5 mg at 05/21/17 1930 .  metoprolol succinate (TOPROL-XL) 24 hr tablet 25 mg, 25 mg, Oral, BID, Duke SalviaKlein, Steven C, MD, 25 mg at 05/26/17 0829 .  morphine 4 MG/ML injection 1 mg, 1 mg, Intravenous, Q4H PRN, Noralee Stainhoi, Jennifer, DO, 1 mg at 05/25/17 1706 .  oxyCODONE (Oxy IR/ROXICODONE) immediate release tablet 5 mg, 5 mg, Oral, Q4H PRN, Noralee Stainhoi, Jennifer, DO, 5 mg at 05/26/17 0828 .  pantoprazole (PROTONIX) EC tablet 40 mg, 40 mg, Oral, Daily, Carron CurieHijazi, Ali, MD, 40 mg at 05/26/17 0828 .  rOPINIRole (REQUIP) tablet 1 mg, 1 mg, Oral, QHS, Carron CurieHijazi, Ali, MD, 1 mg at 05/25/17 2136 .  senna-docusate (Senokot-S) tablet 1 tablet, 1 tablet, Oral, QHS PRN, Carron CurieHijazi, Ali, MD  Patients Current Diet: Diet Heart Room service appropriate? Yes; Fluid consistency: Thin  Precautions / Restrictions Precautions Precautions: Fall, Cervical Cervical Brace: Soft collar Restrictions Weight Bearing Restrictions: No   Has the patient had 2 or more falls or a fall with injury in the past year?No  Prior Activity Level Community (5-7x/wk): Mod I with RW pta; drove  Journalist, newspaperHome Assistive Devices / Equipment Home Assistive Devices/Equipment: Environmental consultantWalker (specify type), Wheelchair Home Equipment: Environmental consultantWalker - 2 wheels, Walker - 4 wheels, Ehrenfeldane - single point, Toilet riser  Prior Device Use: Indicate devices/aids used by the patient prior to current illness, exacerbation or injury? Walker  Prior Functional Level Prior Function Level of Independence: Independent Comments: pt reported that she  ambulates with RW  Self Care: Did the patient need help bathing, dressing, using the toilet or eating?  Independent  Indoor Mobility: Did the patient need assistance with walking from room to room (with or without device)? Independent  Stairs: Did the patient need assistance with internal or external stairs (with or without device)? Independent  Functional Cognition: Did the patient need help planning regular tasks such as shopping or remembering to take medications? Independent  Current Functional Level Cognition  Arousal/Alertness: Awake/alert Overall Cognitive Status: Within Functional Limits for tasks assessed Orientation Level: Oriented X4 General Comments: some decreased safety awareness, requires VCs Memory: Impaired Memory Impairment: Retrieval deficit Awareness: Appears intact Problem Solving: Appears intact Safety/Judgment: Appears intact    Extremity Assessment (  includes Sensation/Coordination)  Upper Extremity Assessment: Generalized weakness, RUE deficits/detail RUE Deficits / Details: unable to actively straighten digits, decreased fine motor and opposition  RUE Coordination: decreased fine motor, decreased gross motor LUE Deficits / Details: generalized weakness   Lower Extremity Assessment: Defer to PT evaluation RLE Deficits / Details: 3/5 flexion, 3+/5 knee extension, 3/5 dorsiflexion.  LLE Deficits / Details: LLE strength 4-/5 globally.     ADLs  Overall ADL's : Needs assistance/impaired Eating/Feeding: Set up, Sitting Grooming: Set up, Sitting Upper Body Bathing: Minimal assistance, Sitting Lower Body Bathing: Moderate assistance, Sit to/from stand Upper Body Dressing : Minimal assistance, Sitting Lower Body Dressing: Maximal assistance, Sit to/from stand Toilet Transfer: Minimal assistance, +2 for safety/equipment, +2 for physical assistance Toilet Transfer Details (indicate cue type and reason): simulated in chair to bed transfer  Toileting- Clothing  Manipulation and Hygiene: Moderate assistance, Sit to/from stand Functional mobility during ADLs: Minimal assistance, +2 for physical assistance, +2 for safety/equipment, Rolling walker General ADL Comments: Focused session on R hand FM and theraputty excercises    Mobility  Overal bed mobility: Needs Assistance Bed Mobility: Sit to Supine Supine to sit: Min assist Sit to supine: Supervision General bed mobility comments: hand hel assist with min A for patient to guide trunk up from supine. Pt now able to advance LEs to EOB with physical assist.     Transfers  Overall transfer level: Needs assistance Equipment used: Rolling walker (2 wheeled) Transfers: Sit to/from Stand Sit to Stand: Mod assist Stand pivot transfers: Min assist, +2 safety/equipment, +2 physical assistance, Mod assist General transfer comment: Mod A to power up into standing from EOB and steady inititally. Pt requires verbal cues for safety when sitting down as she has a tendancy to want to sit before she has come close enough to surface. Cues for handplacment and safe use of RW during sit<>stand transfers.     Ambulation / Gait / Stairs / Wheelchair Mobility  Ambulation/Gait Ambulation/Gait assistance: Architect (Feet): 60 Feet Assistive device: Rolling walker (2 wheeled) Gait Pattern/deviations: Step-to pattern, Step-through pattern, Narrow base of support, Decreased step length - left General Gait Details: Min A for safety with cues for base of support, increasing L step length, can demonstrate intermittent step through gait with cues, but more often is using short step to gait at this time.  Gait velocity: dereased Gait velocity interpretation: Below normal speed for age/gender    Posture / Balance Dynamic Sitting Balance Sitting balance - Comments: static sitting EOB  Balance Overall balance assessment: Needs assistance Sitting-balance support: Feet supported Sitting balance-Leahy Scale:  Good Sitting balance - Comments: static sitting EOB  Standing balance support: During functional activity, Bilateral upper extremity supported Standing balance-Leahy Scale: Poor Standing balance comment: heavy reliance on UE suport for static and dynamic balance.     Special needs/care consideration BiPAP/CPAP  N/a CPM  N/a Continuous Drip IV  N/a Dialysis  N/a Life Vest  N/a Oxygen 2 liters  Special Bed  N/a Trach Size  N/a Wound Vac n/a Skin surgical incision Bowel mgmt: continent LBM 11/14 Bladder mgmt: continent  Diabetic mgmt  N/a   Previous Home Environment Living Arrangements: Alone  Lives With: Alone Available Help at Discharge: Family, Available 24 hours/day(family can do 24/7) Type of Home: House Home Layout: One level Home Access: Ramped entrance Bathroom Shower/Tub: Tub only, Health visitor: Standard Bathroom Accessibility: Yes How Accessible: Accessible via walker Home Care Services: No Additional Comments: Pt can arrange  2-4 weeks of assistance at home  Discharge Living Setting Plans for Discharge Living Setting: Patient's home(family will rotate 24/7 supervision) Type of Home at Discharge: House Discharge Home Layout: One level Discharge Home Access: Ramped entrance Discharge Bathroom Shower/Tub: Walk-in shower(tub only) Discharge Bathroom Toilet: Standard Discharge Bathroom Accessibility: Yes How Accessible: Accessible via walker Does the patient have any problems obtaining your medications?: No  Social/Family/Support Systems Patient Roles: Parent Contact Information: Chelsea AusWanda Shelton, main daughter for contact Anticipated Caregiver: Burna MortimerWanda and other family members to help, Shanda BumpsJessica is another family member who pt relies on a lot Anticipated Caregiver's Contact Information: see above Ability/Limitations of Caregiver: no limitations short term Caregiver Availability: 24/7 Discharge Plan Discussed with Primary Caregiver: Yes Is Caregiver  In Agreement with Plan?: Yes Does Caregiver/Family have Issues with Lodging/Transportation while Pt is in Rehab?: No(Family have been staying with pt 24/7 in hospital)  Goals/Additional Needs Patient/Family Goal for Rehab: supervision with PT and OT Expected length of stay: ELOS 10- 14 days Pt/Family Agrees to Admission and willing to participate: Yes Program Orientation Provided & Reviewed with Pt/Caregiver Including Roles  & Responsibilities: Yes  Decrease burden of Care through IP rehab admission: n/a  Possible need for SNF placement upon discharge: not anticipated  Patient Condition: This patient's condition remains as documented in the consult dated 05/25/2017, in which the Rehabilitation Physician determined and documented that the patient's condition is appropriate for intensive rehabilitative care in an inpatient rehabilitation facility. Will admit to inpatient rehab today.  Preadmission Screen Completed By:  Clois DupesBoyette, Effa Yarrow Godwin, 05/26/2017 2:58 PM ______________________________________________________________________   Discussed status with Dr. Allena KatzPatel on 05/26/2017 at  1505  and received telephone approval for admission today.  Admission Coordinator:  Clois DupesBoyette, Evany Schecter Godwin, time [id number] Date 05/26/2017

## 2017-05-26 NOTE — Progress Notes (Signed)
I await insurance approval for a possible inpt rehab admission. Dr. Maryla MorrowAnkit Patel would like clarification of the plan from neurosurgery for the lumbar stenosis. I will follow up today. [phone]

## 2017-05-26 NOTE — Progress Notes (Signed)
I have insurance approval and bed available to admit pt to inpt rehab today. Patient is in agreement. I have notified RN CM, SW and Dr. Jomarie LongsJoseph. I will make the arrangement to admit today. [phone]

## 2017-05-26 NOTE — PMR Pre-admission (Signed)
Standley Brooking, RN  Rehab Admission Coordinator  Physical Medicine and Rehabilitation  PMR Pre-admission  Deleted  Date of Service:  05/26/2017 2:57 PM        I     View without strikethrough         [] Hide copied text  [] Hover for details   PMR Admission Coordinator Pre-Admission Assessment  Patient: [name] is an 83 y.o., female MRN: [id number] DOB: July 26, 1927 Height: 5\' 7"  (170.2 cm) Weight: 84.7 kg (186 lb 12.8 oz)                                                                                                                                                  Insurance Information HMO: yes    PPO:      PCP:      IPA:      80/20:      OTHER: Medicare advantage plan PRIMARY: [employer] Medicare      Policy#: [id number]      Subscriber: pt CM Name: Hart Robinsons      Phone#: [phone]     Fax#: [phone] Pre-Cert#: [id number] approved for 7 days with updates due to [name] Alert [phone] phone [phone] fax      Employer: retired Benefits:  Phone #: [phone]     Name: 05/26/17 Eff. Date: 07/13/2016     Deduct: none      Out of Pocket Max: $4400      Life Max: none CIR: $345 co pay per day days 1 to 5      SNF: no co pay days 1-20; $160 co pay days 21-48; no co pay days 49-100 Outpatient: $40 co pay per visit     Co-Pay:  Visits per medical neccesity Home Health: 100%      Co-Pay: visits per medical neccesity DME: 80%     Co-Pay: 20% Providers: in network  SECONDARY: none       Medicaid Application Date:       Case Manager:  Disability Application Date:       Case Worker:   Emergency Contact Information        Contact Information    Name Relation Home Work Mobile   [place] Daughter [phone]  [phone]   walker,donna Daughter   [phone]     Current Medical History  Patient Admitting Diagnosis: cervical myelopathy s/p fusion  History of Present Illness: URK:[id number] W Brownis a 83 y.o.femalewith  history of HTN, CKD,PAF- on Xareltoand amiodarone, DDD with chronic back pain who was admitted on 05/22/17 with 2 week history of progressive right sided weakness and numbness with loss of use of right hand, right foot drop and loss of dexterity left hand.Marland KitchenMRI brainreviewed, unremarkable for acute process. Per report,without acute infarct and MRI spine done revealing severe cervical spondylosis with diffuse canal stenosis C3/4, C5/6 and  C6/7, severe cord compression C3/4, R>L reactive edema due to facet arthritis most prevalent at C5/6, multilevel compressive disc bulge throughout thoracic spine and multilevel change lumbar spine L2-L5 with severe spinal stenosis L4/5.   She was evaluated by Dr. Jordan LikesPool who recommended cervical decompression due to myelopathy and she was cleared for surgery by Dr. Graciela HusbandsKlein. She underwent cervical decompression C3- C7 with ACDF on 05/24/17. Therapy ongoing and patient has had decline in functional status in last 3-4 weeks affecting mobility and ability to carry out ADLs.     Past Medical History      Past Medical History:  Diagnosis Date  . Arthritis   . Chronic kidney disease   . Depression   . Dysrhythmia 8/13   palpitations/ now resolved  . GERD (gastroesophageal reflux disease)   . History of blood transfusion 2012  . Hypertension   . Peripheral vascular disease (HCC)    varicose veins bilaterally   . Restless legs syndrome     Family History  family history includes COPD in her mother; Colon cancer in her unknown relative; Colon polyps in her daughter; Heart attack in her father; Hypertension in her father; Lung cancer in her sister; Stroke in her father; Uterine cancer in her sister.  Prior Rehab/Hospitalizations:  Has the patient had major surgery during 100 days prior to admission? No  Current Medications   Current Facility-Administered Medications:  .  albuterol (PROVENTIL) (2.5 MG/3ML) 0.083% nebulizer solution 2.5 mg,  2.5 mg, Inhalation, Daily PRN, Carron CurieHijazi, Ali, MD .  ALPRAZolam Prudy Feeler(XANAX) tablet 0.25 mg, 0.25 mg, Oral, QHS PRN, Carron CurieHijazi, Ali, MD, 0.25 mg at 05/25/17 2137 .  amiodarone (PACERONE) tablet 200 mg, 200 mg, Oral, QHS, Carron CurieHijazi, Ali, MD, 200 mg at 05/25/17 2137 .  amLODipine (NORVASC) tablet 2.5 mg, 2.5 mg, Oral, QPC breakfast, Carron CurieHijazi, Ali, MD, 2.5 mg at 05/26/17 0829 .  bisacodyl (DULCOLAX) suppository 10 mg, 10 mg, Rectal, Daily, Carron CurieHijazi, Ali, MD, 10 mg at 05/26/17 1433 .  fluticasone (FLONASE) 50 MCG/ACT nasal spray 2 spray, 2 spray, Each Nare, Daily, Noralee Stainhoi, Jennifer, DO, 2 spray at 05/26/17 0829 .  guaiFENesin (MUCINEX) 12 hr tablet 600 mg, 600 mg, Oral, BID PRN, Noralee Stainhoi, Jennifer, DO, 600 mg at 05/25/17 1006 .  loratadine (CLARITIN) tablet 10 mg, 10 mg, Oral, Daily, Carron CurieHijazi, Ali, MD, 10 mg at 05/26/17 0829 .  LORazepam (ATIVAN) injection 0.5 mg, 0.5 mg, Intravenous, PRN, Cardama, Amadeo GarnetPedro Eduardo, MD, 0.5 mg at 05/21/17 1930 .  metoprolol succinate (TOPROL-XL) 24 hr tablet 25 mg, 25 mg, Oral, BID, Duke SalviaKlein, Steven C, MD, 25 mg at 05/26/17 0829 .  morphine 4 MG/ML injection 1 mg, 1 mg, Intravenous, Q4H PRN, Noralee Stainhoi, Jennifer, DO, 1 mg at 05/25/17 1706 .  oxyCODONE (Oxy IR/ROXICODONE) immediate release tablet 5 mg, 5 mg, Oral, Q4H PRN, Noralee Stainhoi, Jennifer, DO, 5 mg at 05/26/17 0828 .  pantoprazole (PROTONIX) EC tablet 40 mg, 40 mg, Oral, Daily, Carron CurieHijazi, Ali, MD, 40 mg at 05/26/17 0828 .  rOPINIRole (REQUIP) tablet 1 mg, 1 mg, Oral, QHS, Carron CurieHijazi, Ali, MD, 1 mg at 05/25/17 2136 .  senna-docusate (Senokot-S) tablet 1 tablet, 1 tablet, Oral, QHS PRN, Carron CurieHijazi, Ali, MD  Patients Current Diet: Diet Heart Room service appropriate? Yes; Fluid consistency: Thin  Precautions / Restrictions Precautions Precautions: Fall, Cervical Cervical Brace: Soft collar Restrictions Weight Bearing Restrictions: No   Has the patient had 2 or more falls or a fall with injury in the past year?No  Prior Activity Level Community (5-7x/wk): Mod  I with RW pta; drove  Journalist, newspaperHome Assistive Devices / Equipment Home Assistive Devices/Equipment: Environmental consultantWalker (specify type), Wheelchair Home Equipment: Environmental consultantWalker - 2 wheels, Walker - 4 wheels, [place] - single point, Toilet riser  Prior Device Use: Indicate devices/aids used by the patient prior to current illness, exacerbation or injury? Walker  Prior Functional Level Prior Function Level of Independence: Independent Comments: pt reported that she ambulates with RW  Self Care: Did the patient need help bathing, dressing, using the toilet or eating?  Independent  Indoor Mobility: Did the patient need assistance with walking from room to room (with or without device)? Independent  Stairs: Did the patient need assistance with internal or external stairs (with or without device)? Independent  Functional Cognition: Did the patient need help planning regular tasks such as shopping or remembering to take medications? Independent  Current Functional Level Cognition  Arousal/Alertness: Awake/alert Overall Cognitive Status: Within Functional Limits for tasks assessed Orientation Level: Oriented X4 General Comments: some decreased safety awareness, requires VCs Memory: Impaired Memory Impairment: Retrieval deficit Awareness: Appears intact Problem Solving: Appears intact Safety/Judgment: Appears intact    Extremity Assessment (includes Sensation/Coordination)  Upper Extremity Assessment: Generalized weakness, RUE deficits/detail RUE Deficits / Details: unable to actively straighten digits, decreased fine motor and opposition  RUE Coordination: decreased fine motor, decreased gross motor LUE Deficits / Details: generalized weakness   Lower Extremity Assessment: Defer to PT evaluation RLE Deficits / Details: 3/5 flexion, 3+/5 knee extension, 3/5 dorsiflexion.  LLE Deficits / Details: LLE strength 4-/5 globally.     ADLs  Overall ADL's : Needs assistance/impaired Eating/Feeding: Set up,  Sitting Grooming: Set up, Sitting Upper Body Bathing: Minimal assistance, Sitting Lower Body Bathing: Moderate assistance, Sit to/from stand Upper Body Dressing : Minimal assistance, Sitting Lower Body Dressing: Maximal assistance, Sit to/from stand Toilet Transfer: Minimal assistance, +2 for safety/equipment, +2 for physical assistance Toilet Transfer Details (indicate cue type and reason): simulated in chair to bed transfer  Toileting- Clothing Manipulation and Hygiene: Moderate assistance, Sit to/from stand Functional mobility during ADLs: Minimal assistance, +2 for physical assistance, +2 for safety/equipment, Rolling walker General ADL Comments: Focused session on R hand FM and theraputty excercises    Mobility  Overal bed mobility: Needs Assistance Bed Mobility: Sit to Supine Supine to sit: Min assist Sit to supine: Supervision General bed mobility comments: hand hel assist with min A for patient to guide trunk up from supine. Pt now able to advance LEs to EOB with physical assist.     Transfers  Overall transfer level: Needs assistance Equipment used: Rolling walker (2 wheeled) Transfers: Sit to/from Stand Sit to Stand: Mod assist Stand pivot transfers: Min assist, +2 safety/equipment, +2 physical assistance, Mod assist General transfer comment: Mod A to power up into standing from EOB and steady inititally. Pt requires verbal cues for safety when sitting down as she has a tendancy to want to sit before she has come close enough to surface. Cues for handplacment and safe use of RW during sit<>stand transfers.     Ambulation / Gait / Stairs / Wheelchair Mobility  Ambulation/Gait Ambulation/Gait assistance: ArchitectMin assist Ambulation Distance (Feet): 60 Feet Assistive device: Rolling walker (2 wheeled) Gait Pattern/deviations: Step-to pattern, Step-through pattern, Narrow base of support, Decreased step length - left General Gait Details: Min A for safety with cues for base of  support, increasing L step length, can demonstrate intermittent step through gait with cues, but more often is using short step to gait at this time.  Gait  velocity: dereased Gait velocity interpretation: Below normal speed for age/gender    Posture / Balance Dynamic Sitting Balance Sitting balance - Comments: static sitting EOB  Balance Overall balance assessment: Needs assistance Sitting-balance support: Feet supported Sitting balance-Leahy Scale: Good Sitting balance - Comments: static sitting EOB  Standing balance support: During functional activity, Bilateral upper extremity supported Standing balance-Leahy Scale: Poor Standing balance comment: heavy reliance on UE suport for static and dynamic balance.     Special needs/care consideration BiPAP/CPAP  N/a CPM  N/a Continuous Drip IV  N/a Dialysis  N/a Life Vest  N/a Oxygen 2 liters [place] Special Bed  N/a Trach Size  N/a Wound Vac n/a Skin surgical incision Bowel mgmt: continent LBM 11/14 Bladder mgmt: continent  Diabetic mgmt  N/a   Previous Home Environment Living Arrangements: Alone  Lives With: Alone Available Help at Discharge: Family, Available 24 hours/day(family can do 24/7) Type of Home: House Home Layout: One level Home Access: Ramped entrance Bathroom Shower/Tub: Tub only, Health visitor: Standard Bathroom Accessibility: Yes How Accessible: Accessible via walker Home Care Services: No Additional Comments: Pt can arrange 2-4 weeks of assistance at home  Discharge Living Setting Plans for Discharge Living Setting: Patient's home(family will rotate 24/7 supervision) Type of Home at Discharge: House Discharge Home Layout: One level Discharge Home Access: Ramped entrance Discharge Bathroom Shower/Tub: Walk-in shower(tub only) Discharge Bathroom Toilet: Standard Discharge Bathroom Accessibility: Yes How Accessible: Accessible via walker Does the patient have any problems obtaining your  medications?: No  Social/Family/Support Systems Patient Roles: Parent Contact Information: Chelsea Aus, main daughter for contact Anticipated Caregiver: Burna Mortimer and other family members to help, Shanda Bumps is another family member who pt relies on a lot Anticipated Caregiver's Contact Information: see above Ability/Limitations of Caregiver: no limitations short term Caregiver Availability: 24/7 Discharge Plan Discussed with Primary Caregiver: Yes Is Caregiver In Agreement with Plan?: Yes Does Caregiver/Family have Issues with Lodging/Transportation while Pt is in Rehab?: No(Family have been staying with pt 24/7 in hospital)  Goals/Additional Needs Patient/Family Goal for Rehab: supervision with PT and OT Expected length of stay: ELOS 10- 14 days Pt/Family Agrees to Admission and willing to participate: Yes Program Orientation Provided & Reviewed with Pt/Caregiver Including Roles  & Responsibilities: Yes  Decrease burden of Care through IP rehab admission: n/a  Possible need for SNF placement upon discharge: not anticipated  Patient Condition: This patient's condition remains as documented in the consult dated 05/25/2017, in which the Rehabilitation Physician determined and documented that the patient's condition is appropriate for intensive rehabilitative care in an inpatient rehabilitation facility. Will admit to inpatient rehab today.  Preadmission Screen Completed By:  Clois Dupes, 05/26/2017 2:58 PM ______________________________________________________________________   Discussed status with Dr. Allena Katz on 05/26/2017 at  1505  and received telephone approval for admission today.  Admission Coordinator:  Clois Dupes, time 1610 Date 05/26/2017              Standley Brooking, RN at 05/26/2017 3:25 PM  Chart Correction History

## 2017-05-26 NOTE — H&P (Signed)
  Physical Medicine and Rehabilitation Admission H&P    Chief Complaint  Patient presents with  . Functional deficits due to cervical myelopathy    HPI:  [name] is a 83 y.o. female with history of HTN, CKD, PAF- on Xarelto and amiodarone , DDD with chronic back pain who was admitted on 05/22/17 with 2 week history of progressive right sided weakness and numbness with loss of use of right hand, right foot drop and loss of dexterity left hand.  History taken from chart review and daughter. MRI brain reviewed, unremarkable for acute process. Per report, without acute infarct and MRI spine done revealing severe cervical spondylosis with diffuse canal stenosis C3/4, C5/6 and C6/7, severe cord compression C3/4,  R> L reactive edema due to facet arthritis most prevalent at C5/6, multilevel compressive disc bulge throughout thoracic spine and multilevel change lumbar spine L2-L5 with severe spinal stenosis L4/5.   She was evaluated by Dr. Pool who recommended cervical decompression due to myelopathy and she was cleared for surgery by Dr. Klein. She underwent cervical decompression C3- C7 with ACDF on 05/24/17. Therapy ongoing and patient has had decline in functional status in last 3-4 weeks affecting mobility and ability to carry out ADLs. CIR recommended by MD.    Review of Systems  HENT: Positive for hearing loss. Negative for ear pain and tinnitus.   Respiratory: Negative for cough and shortness of breath.   Cardiovascular: Negative for chest pain, palpitations and leg swelling.  Gastrointestinal: Positive for constipation (uses suppository every day) and heartburn.  Genitourinary: Positive for urgency. Negative for dysuria.       Has a  Pessary--stress/urge incontinence  Musculoskeletal: Positive for back pain, joint pain (severe right hip and buttock pain) and myalgias.  Skin: Negative for itching and rash.  Neurological: Positive for sensory change (Right side), speech change  (dysarthria), focal weakness and weakness.  Psychiatric/Behavioral: The patient is nervous/anxious (nerves get bad at night) and has insomnia.   All other systems reviewed and are negative.     Past Medical History:  Diagnosis Date  . Arthritis   . Chronic kidney disease   . Depression   . Dysrhythmia 8/13   palpitations/ now resolved  . GERD (gastroesophageal reflux disease)   . History of blood transfusion 2012  . Hypertension   . Peripheral vascular disease (HCC)    varicose veins bilaterally   . Restless legs syndrome     Past Surgical History:  Procedure Laterality Date  . ABDOMINAL HYSTERECTOMY    . APPENDECTOMY    . EYE SURGERY Bilateral    cataract extraction with IOL  . JOINT REPLACEMENT Right    knee  . SEPTOPLASTY     repair deviated septum  . VARICOSE VEIN SURGERY Bilateral     Family History  Problem Relation Age of Onset  . Heart attack Father   . Stroke Father   . Hypertension Father   . COPD Mother   . Lung cancer Sister   . Uterine cancer Sister   . Colon polyps Daughter   . Colon cancer Unknown     Social History:  Lives alone. Worked in a grocery store till age 84. Independent without AD till a month ago. Her family has been helping out in the past month --checking in and with meals. Her  daughter is a nurse at [employer] and reports that family can provide 1?-2 weeks of supervision after discharge.  She reports that  has never smoked. she   has never used smokeless tobacco. She reports that she does not drink alcohol or use drugs.    Allergies  Allergen Reactions  . Hctz [Hydrochlorothiazide]     Dizziness   . Lasix [Furosemide]     fatigue   . Penicillins Hives and Itching  . Tramadol-Acetaminophen     GI upset     Medications Prior to Admission  Medication Sig Dispense Refill  . albuterol (VENTOLIN HFA) 108 (90 Base) MCG/ACT inhaler Inhale 2 puffs daily as needed into the lungs for wheezing or shortness of breath.     . ALPRAZolam (XANAX)  0.25 MG tablet Take 0.25 mg at bedtime as needed by mouth for anxiety or sleep.     Marland Kitchen amiodarone (PACERONE) 200 MG tablet Take 200 mg at bedtime by mouth.     Marland Kitchen amLODipine (NORVASC) 5 MG tablet Take 2.5 mg daily after breakfast by mouth.     . bisacodyl (DULCOLAX) 10 MG suppository Place 10 mg daily rectally.    . cetirizine (ZYRTEC) 10 MG tablet Take 10 mg by mouth daily.    Marland Kitchen conjugated estrogens (PREMARIN) vaginal cream Place 1 application at bedtime as needed vaginally.    . Cyanocobalamin (VITAMIN B-12 IJ) Inject 1 each every 30 (thirty) days as directed.    Marland Kitchen estradiol (ESTRACE) 0.5 MG tablet Take 0.5 mg daily by mouth.    . fluticasone (FLONASE) 50 MCG/ACT nasal spray Place 1 spray daily as needed into both nostrils for allergies.     . metoprolol succinate (TOPROL-XL) 100 MG 24 hr tablet Take 100 mg 2 (two) times daily by mouth.     . pantoprazole (PROTONIX) 40 MG tablet Take 40 mg by mouth daily.    Marland Kitchen rOPINIRole (REQUIP) 0.5 MG tablet Take 1 mg by mouth at bedtime.    Alveda Reasons 15 MG TABS tablet Take 15 mg daily by mouth.     Marland Kitchen acebutolol (SECTRAL) 400 MG capsule Take 400 mg by mouth daily after breakfast.     . acetaminophen (TYLENOL) 500 MG tablet Take 500 mg by mouth every 6 (six) hours as needed for pain.    Marland Kitchen aspirin EC 81 MG EC tablet Take 1 tablet (81 mg total) by mouth daily. 30 tablet 0  . estradiol (ESTRACE) 1 MG tablet Take 1 mg by mouth daily.    Marland Kitchen gabapentin (NEURONTIN) 300 MG capsule Take 600 mg by mouth daily.    Marland Kitchen gabapentin (NEURONTIN) 300 MG capsule Take 1 capsule (300 mg total) by mouth 2 (two) times daily. 90 capsule 6  . Omeprazole-Sodium Bicarbonate (ZEGERID) 20-1100 MG CAPS Take 1 capsule by mouth at bedtime.       Drug Regimen Review  Drug regimen was reviewed and remains appropriate with no significant issues identified  Home: Home Living Family/patient expects to be discharged to:: Private residence Living Arrangements: Alone Available Help at  Discharge: Family, Other (Comment) Type of Home: House Home Access: Ramped entrance Home Layout: One level Bathroom Shower/Tub: Tub only, Multimedia programmer: Standard Home Equipment: Environmental consultant - 2 wheels, Environmental consultant - 4 wheels, Cane - single point, Toilet riser Additional Comments: Pt can arrange 2-4 weeks of assistance at home  Lives With: Alone   Functional History: Prior Function Level of Independence: Independent Comments: pt reported that she ambulates with RW  Functional Status:  Mobility: Bed Mobility Overal bed mobility: Needs Assistance Bed Mobility: Sit to Supine Supine to sit: Mod assist, +2 for physical assistance, +2 for safety/equipment Sit to  supine: Supervision General bed mobility comments: assist to guide trunk and assist for bil LEs when returning to supine  Transfers Overall transfer level: Needs assistance Equipment used: Rolling walker (2 wheeled) Transfers: Sit to/from Stand, Stand Pivot Transfers Sit to Stand: Mod assist, +2 safety/equipment, +2 physical assistance Stand pivot transfers: Min assist, +2 safety/equipment, +2 physical assistance, Mod assist General transfer comment: ModA to boost into standing from recliner and steady initially; Pt demonstrating ataxic gait; Pt ambulating small distance from recliner to EOB, initially with MinA, increased to ModA as Pt attempting to sit prior to fully being in front of EOB, multimodal cues for managing RW  Ambulation/Gait Ambulation/Gait assistance: Min assist Ambulation Distance (Feet): 15 Feet Assistive device: Rolling walker (2 wheeled) Gait Pattern/deviations: Step-through pattern, Decreased step length - right, Decreased step length - left, Decreased stride length, Decreased dorsiflexion - right General Gait Details: Min A with cues for sequencing and safety with RW. Pt fatigued from using commode and tolerating sitting EOB for 20+ minutes with rehab PA screening. Struggles with RLE limb advancement.    Gait velocity: dereased Gait velocity interpretation: Below normal speed for age/gender    ADL: ADL Overall ADL's : Needs assistance/impaired Eating/Feeding: Set up, Sitting Grooming: Set up, Sitting Upper Body Bathing: Minimal assistance, Sitting Lower Body Bathing: Moderate assistance, Sit to/from stand Upper Body Dressing : Minimal assistance, Sitting Lower Body Dressing: Maximal assistance, Sit to/from stand Toilet Transfer: Minimal assistance, +2 for safety/equipment, +2 for physical assistance Toilet Transfer Details (indicate cue type and reason): simulated in chair to bed transfer  Toileting- Clothing Manipulation and Hygiene: Moderate assistance, Sit to/from stand Functional mobility during ADLs: Minimal assistance, +2 for physical assistance, +2 for safety/equipment, Rolling walker General ADL Comments: Focused session on R hand FM and theraputty excercises  Cognition: Cognition Overall Cognitive Status: Within Functional Limits for tasks assessed Arousal/Alertness: Awake/alert Orientation Level: Oriented X4 Memory: Impaired Memory Impairment: Retrieval deficit Awareness: Appears intact Problem Solving: Appears intact Safety/Judgment: Appears intact Cognition Arousal/Alertness: Awake/alert Behavior During Therapy: WFL for tasks assessed/performed Overall Cognitive Status: Within Functional Limits for tasks assessed General Comments: some decreased safety awareness, requires VCs   Blood pressure 123/62, pulse 78, temperature 97.8 F (36.6 C), temperature source Oral, resp. rate 16, height 5' 7" (1.702 m), weight 84.7 kg (186 lb 12.8 oz), SpO2 99 %. Physical Exam  Nursing note and vitals reviewed. Constitutional: She is oriented to person, place, and time. She appears well-developed and well-nourished.  Restless and uncomfortable due to right hip/buttock pain  HENT:  Head: Normocephalic and atraumatic.  Mouth/Throat: Oropharynx is clear and moist.  Eyes:  Conjunctivae and EOM are normal. Pupils are equal, round, and reactive to light.  Neck:  Anterior neck incision C/D/I with steri strips in place. Dependent ecchymosis and min edema.  Soft collar in place  Cardiovascular: Normal rate and regular rhythm.  Respiratory: Effort normal. No stridor. No respiratory distress. She has no wheezes.  +  GI: Soft. She exhibits no distension. Bowel sounds are decreased. There is no tenderness.  Musculoskeletal: She exhibits edema (trace pedal edema). She exhibits no tenderness.  Stasis changes BLE  Neurological: She is alert and oriented to person, place, and time.  Mild dysarthria.  HOH. Able to follow basic commands without difficulty.  Motor: 4/5 grossly thoughout  Skin: Skin is warm and dry.  Psychiatric: She has a normal mood and affect. Her behavior is normal. Judgment and thought content normal.    Results for orders placed or performed during   the hospital encounter of 05/21/17 (from the past 48 hour(s))  CBC     Status: None   Collection Time: 05/25/17  5:52 AM  Result Value Ref Range   WBC 7.9 4.0 - 10.5 K/uL   RBC 4.18 3.87 - 5.11 MIL/uL   Hemoglobin 13.1 12.0 - 15.0 g/dL   HCT 38.4 36.0 - 46.0 %   MCV 91.9 78.0 - 100.0 fL   MCH 31.3 26.0 - 34.0 pg   MCHC 34.1 30.0 - 36.0 g/dL   RDW 15.2 11.5 - 15.5 %   Platelets 197 150 - 400 K/uL  Basic metabolic panel     Status: Abnormal   Collection Time: 05/25/17  5:52 AM  Result Value Ref Range   Sodium 131 (L) 135 - 145 mmol/L   Potassium 4.3 3.5 - 5.1 mmol/L   Chloride 99 (L) 101 - 111 mmol/L   CO2 23 22 - 32 mmol/L   Glucose, Bld 153 (H) 65 - 99 mg/dL   BUN 20 6 - 20 mg/dL   Creatinine, Ser 1.08 (H) 0.44 - 1.00 mg/dL   Calcium 8.9 8.9 - 10.3 mg/dL   GFR calc non Af Amer 45 (L) >60 mL/min   GFR calc Af Amer 52 (L) >60 mL/min    Comment: (NOTE) The eGFR has been calculated using the CKD EPI equation. This calculation has not been validated in all clinical situations. eGFR's  persistently <60 mL/min signify possible Chronic Kidney Disease.    Anion gap 9 5 - 15  Basic metabolic panel     Status: Abnormal   Collection Time: 05/26/17  4:13 AM  Result Value Ref Range   Sodium 133 (L) 135 - 145 mmol/L   Potassium 4.7 3.5 - 5.1 mmol/L   Chloride 100 (L) 101 - 111 mmol/L   CO2 27 22 - 32 mmol/L   Glucose, Bld 107 (H) 65 - 99 mg/dL   BUN 32 (H) 6 - 20 mg/dL   Creatinine, Ser 1.22 (H) 0.44 - 1.00 mg/dL   Calcium 8.6 (L) 8.9 - 10.3 mg/dL   GFR calc non Af Amer 39 (L) >60 mL/min   GFR calc Af Amer 45 (L) >60 mL/min    Comment: (NOTE) The eGFR has been calculated using the CKD EPI equation. This calculation has not been validated in all clinical situations. eGFR's persistently <60 mL/min signify possible Chronic Kidney Disease.    Anion gap 6 5 - 15  CBC     Status: Abnormal   Collection Time: 05/26/17  4:13 AM  Result Value Ref Range   WBC 8.7 4.0 - 10.5 K/uL   RBC 3.85 (L) 3.87 - 5.11 MIL/uL   Hemoglobin 11.8 (L) 12.0 - 15.0 g/dL   HCT 35.5 (L) 36.0 - 46.0 %   MCV 92.2 78.0 - 100.0 fL   MCH 30.6 26.0 - 34.0 pg   MCHC 33.2 30.0 - 36.0 g/dL   RDW 15.4 11.5 - 15.5 %   Platelets 161 150 - 400 K/uL   Dg Cervical Spine 1 View  Result Date: 05/24/2017 CLINICAL DATA:  Intraoperative fluoroscopy provided for anterior cervical decompression and fusion. EXAM: CERVICAL SPINE 1 VIEW COMPARISON:  None. FINDINGS: Initial image shows placement of a surgical needle superimpose over the C3-C4 disc. Followup image shows placement of anterior fusion plate spanning C3-C4 and spanning C5 through C7, with radiolucent disc spacers at C3-C4, C5-C6 and C6-C7. The orthopedic hardware appears well seated and aligned. Mature bony fusion is seen between the   C4 and C5 vertebra. IMPRESSION: Fluoroscopic imaging provided for anterior cervical disc fusion, C3-C4 and C5 through C7. Electronically Signed   By: Lajean Manes M.D.   On: 05/24/2017 20:36   Dg C-arm 1-60 Min  Result Date:  05/24/2017 CLINICAL DATA:  Intraoperative fluoroscopy provided for anterior cervical decompression and fusion. EXAM: CERVICAL SPINE 1 VIEW COMPARISON:  None. FINDINGS: Initial image shows placement of a surgical needle superimpose over the C3-C4 disc. Followup image shows placement of anterior fusion plate spanning R7-E0 and spanning C5 through C7, with radiolucent disc spacers at C3-C4, C5-C6 and C6-C7. The orthopedic hardware appears well seated and aligned. Mature bony fusion is seen between the C4 and C5 vertebra. IMPRESSION: Fluoroscopic imaging provided for anterior cervical disc fusion, C3-C4 and C5 through C7. Electronically Signed   By: Lajean Manes M.D.   On: 05/24/2017 20:36    Medical Problem List and Plan: 1.  Gait abnormality and limitations in self-care secondary to cervical myelopathy. 2.  DVT Prophylaxis/Anticoagulation: Mechanical: Sequential compression devices, below knee Bilateral lower extremities 3. Pain Management: Will continue oxycodone prn. Add voltaren gel additionally with ice/heat for local measures.  4. Mood: LCSW to follow for evaluation and support.  5. Neuropsych: This patient is capable of making decisions on her own behalf. 6. Skin/Wound Care: monitor wound for healing. Add nutritional supplement to promote healing 7. Fluids/Electrolytes/Nutrition: Monitor I/O. Check lytes in am.  8. A fib: Monitor heart rate bid--on amiodarone at bedtime with toprol XL bid. Xarelto on hold 9. HTN: Monitor BP bid. Continue norvasc and metoprolol 10 CKD: Encourage fluid intake. Monitor renal status 11. RLS: Managed with requip at nights and xanax prn 12. GERD: Continue protonix. 13. Seasonal allergies: On claritin and flonase--uses albuterol prn for wheezing.  14.  Chronic LBP with radiculopathy: Unable to tolerated gabapentin or tramadol. Will continue to use local measures . 14. Chronic constipation: Uses suppository daily. Will add miralax due to reports of hard stools and  now on narcotics.  15. Chronic mild hyponatremia: Encourage intake.    Post Admission Physician Evaluation: 1. Preadmission assessment reviewed and changes made below. 2. Functional deficits secondary  to cervical myelopathy. 3. Patient is admitted to receive collaborative, interdisciplinary care between the physiatrist, rehab nursing staff, and therapy team. 4. Patient's level of medical complexity and substantial therapy needs in context of that medical necessity cannot be provided at a lesser intensity of care such as a SNF. 5. Patient has experienced substantial functional loss from his/her baseline which was documented above under the "Functional History" and "Functional Status" headings.  Judging by the patient's diagnosis, physical exam, and functional history, the patient has potential for functional progress which will result in measurable gains while on inpatient rehab.  These gains will be of substantial and practical use upon discharge  in facilitating mobility and self-care at the household level. 12. Physiatrist will provide 24 hour management of medical needs as well as oversight of the therapy plan/treatment and provide guidance as appropriate regarding the interaction of the two. 7. 24 hour rehab nursing will assist with safety, skin/wound care, disease management, pain management and patient education  and help integrate therapy concepts, techniques,education, etc. 8. PT will assess and treat for/with: Lower extremity strength, range of motion, stamina, balance, functional mobility, safety, adaptive techniques and equipment, woundcare, coping skills, pain control, education.   Goals are: Supervision/Mod I. 9. OT will assess and treat for/with: ADL's, functional mobility, safety, upper extremity strength, adaptive techniques and equipment, wound mgt,  ego support, and community reintegration.   Goals are: Supervision/Mod I. Therapy may not proceed with showering this patient. 10. Case  Management and Social Worker will assess and treat for psychological issues and discharge planning. 11. Team conference will be held weekly to assess progress toward goals and to determine barriers to discharge. 12. Patient will receive at least 3 hours of therapy per day at least 5 days per week. 13. ELOS: 10-14 days.       14. Prognosis:  good  Ankit Patel, MD, ABPMR Love, Pamela S, PA-C 05/26/2017  

## 2017-05-26 NOTE — Progress Notes (Signed)
Physical Therapy Treatment Patient Details Name: Lisa GamblerDorothy W Broadfoot MRN: [id number] DOB: 05/20/1928 Today's Date: 05/26/2017    History of Present Illness 83 y.o. female now s/p ANTERIOR CERVICAL DECOMPRESSION/DISCECTOMY FUSION C 3-4, C 5-6, and C 6-7 on 05/24/17.  PMH includes A-Fib, arthritis, depression, TKA chronic kidney disease, dysrhythmia, HTN, GERD, peripheral vascular disease.    PT Comments    Pt is progressing well towards goals. Session focused on therex and gait training. Pt demonstrating improved activity tolerance now able to ambulate short distances outside of hospital room with min A and use of RW with mild improvements in dynamic balance from prior sessions.  Patient responds well to cues for improved gait mechanics throughout session. Pt and family remain eager and encouraged by her progress so far, and will continue to benefit greatly from d/c to CIR.      Follow Up Recommendations  CIR     Equipment Recommendations  None recommended by PT    Recommendations for Other Services       Precautions / Restrictions Precautions Precautions: Fall;Cervical Required Braces or Orthoses: Cervical Brace Cervical Brace: Soft collar Restrictions Weight Bearing Restrictions: No    Mobility  Bed Mobility Overal bed mobility: Needs Assistance Bed Mobility: Sit to Supine     Supine to sit: Min assist     General bed mobility comments: hand hel assist with min A for patient to guide trunk up from supine. Pt now able to advance LEs to EOB with physical assist.   Transfers Overall transfer level: Needs assistance Equipment used: Rolling walker (2 wheeled) Transfers: Sit to/from Stand Sit to Stand: Mod assist         General transfer comment: Mod A to power up into standing from EOB and steady inititally. Pt requires verbal cues for safety when sitting down as she has a tendancy to want to sit before she has come close enough to surface. Cues for handplacment and safe  use of RW during sit<>stand transfers.   Ambulation/Gait Ambulation/Gait assistance: Min assist Ambulation Distance (Feet): 60 Feet Assistive device: Rolling walker (2 wheeled) Gait Pattern/deviations: Step-to pattern;Step-through pattern;Narrow base of support;Decreased step length - left Gait velocity: dereased   General Gait Details: Min A for safety with cues for base of support, increasing L step length, can demonstrate intermittent step through gait with cues, but more often is using short step to gait at this time.    Stairs            Wheelchair Mobility    Modified Rankin (Stroke Patients Only)       Balance Overall balance assessment: Needs assistance Sitting-balance support: Feet supported Sitting balance-Leahy Scale: Good Sitting balance - Comments: static sitting EOB    Standing balance support: During functional activity;Bilateral upper extremity supported Standing balance-Leahy Scale: Poor Standing balance comment: heavy reliance on UE suport for static and dynamic balance.                             Cognition Arousal/Alertness: Awake/alert Behavior During Therapy: WFL for tasks assessed/performed Overall Cognitive Status: Within Functional Limits for tasks assessed                                        Exercises General Exercises - Lower Extremity Ankle Circles/Pumps: AROM;Both;20 reps Short Arc Quad: AROM;Both;10 reps Hand Exercises Forearm Supination: AROM;Right;10  reps Forearm Pronation: AROM;Both;10 reps Wrist Flexion: AROM;Both;10 reps Wrist Extension: AROM;Both;10 reps Wrist Ulnar Deviation: AROM;Both;10 reps Wrist Radial Deviation: AROM;Both;10 reps Digit Composite Flexion: AROM;10 reps;Seated;Right Composite Extension: AROM;Right;10 reps;Seated Digit Composite Abduction: AROM;Right;10 reps Digit Composite Adduction: AROM;Right;10 reps Other Exercises Other Exercises: PIP/DIP/MCP flexion/extension x10 R  AROM.     General Comments General comments (skin integrity, edema, etc.): granddaughter present, VSS throughout session. SpO2=>90% on 2L supplamental O2 with increased activity.       Pertinent Vitals/Pain Pain Assessment: Faces Faces Pain Scale: Hurts a little bit Pain Location: Low back Pain Descriptors / Indicators: Aching;Discomfort Pain Intervention(s): Limited activity within patient's tolerance;Repositioned;Monitored during session    Home Living                      Prior Function            PT Goals (current goals can now be found in the care plan section) Acute Rehab PT Goals Patient Stated Goal: return to PLOF, go to CIR PT Goal Formulation: With patient/family Time For Goal Achievement: 05/29/17 Potential to Achieve Goals: Good Progress towards PT goals: Progressing toward goals    Frequency    Min 5X/week      PT Plan Current plan remains appropriate    Co-evaluation              AM-PAC PT "6 Clicks" Daily Activity  Outcome Measure  Difficulty turning over in bed (including adjusting bedclothes, sheets and blankets)?: A Lot Difficulty moving from lying on back to sitting on the side of the bed? : A Lot Difficulty sitting down on and standing up from a chair with arms (e.g., wheelchair, bedside commode, etc,.)?: A Little Help needed moving to and from a bed to chair (including a wheelchair)?: A Little Help needed walking in hospital room?: A Little Help needed climbing 3-5 steps with a railing? : A Lot 6 Click Score: 15    End of Session Equipment Utilized During Treatment: Gait belt;Oxygen Activity Tolerance: Patient tolerated treatment well Patient left: in chair;with call bell/phone within reach;with family/visitor present Nurse Communication: Mobility status PT Visit Diagnosis: Unsteadiness on feet (R26.81);Other abnormalities of gait and mobility (R26.89)     Time: [id number] PT Time Calculation (min) (ACUTE ONLY): 31  min  Charges:  $Gait Training: 8-22 mins $Therapeutic Exercise: 8-22 mins                    G Codes:       Etta GrandchildSean Alantra Popoca, PT, DPT Acute Rehab Services Pager: [phone]     Etta GrandchildSean  Shaela Boer 05/26/2017, 12:33 PM

## 2017-05-26 NOTE — Care Management Important Message (Signed)
Important Message  Patient Details  Name: Lisa GamblerDorothy W Merkley MRN: [id number] Date of Birth: 08/11/1927   Medicare Important Message Given:  Yes    Jaquavius Hudler Stefan ChurchBratton 05/26/2017, 12:45 PM

## 2017-05-26 NOTE — Discharge Summary (Addendum)
Physician Discharge Summary  [name] ZOX:[id number] DOB: 06-24-1928 DOA: 05/21/2017  PCP: Lenell Antu, DO  Admit date: 05/21/2017 Discharge date: 05/26/2017  Time spent: 45 minutes  1. CIR for Rehab  Discharge Diagnoses:  Principal Problem:   Cervical stenosis of spine Active Problems:   Right sided weakness   Cervical myelopathy (HCC)   AF (paroxysmal atrial fibrillation) (HCC)   Chronic diastolic CHF (congestive heart failure) (HCC)   CKD (chronic kidney disease) stage 3, GFR 30-59 ml/min (HCC)   Essential hypertension   DDD (degenerative disc disease), cervical   Post-operative pain   Chronic a-fib (HCC)   Hyponatremia   Discharge Condition: stable  Diet recommendation:low sodium heart healthy  Filed Weights   05/22/17 0200  Weight: 84.7 kg (186 lb 12.8 oz)    History of present illness:  DorothyBrownis a83 y.o.female,with past medical history significant for chronic kidney disease ,osteoarthritis,arthritis of the spine andparoxysmalatrial fibrillation onXareltopresenting with 2 weekshistory of right-sidedlower extremitynumbness and weakness. She received steroid spinal injection by Dr. Ethelene Hal (orthopedic surgery) couple of weeks ago without improvement in right sciatica. She has now developed worsening right lower extremity weaknessand presents to the hospital  Hospital Course:  Right sided weakness secondary to severe cervical stenosis with myelopathy  -admitted with progressive 2 week history of right upper and lower extremity weakness and numbness. -MRIspinerevealed severe cervical spondylolysis with resultant severe diffuse canal stenosis, most severe at C3-4, C5-6, and C6-7. Multilevel facet arthropathy within the lower thoracic spine most prevalent at T11-12. Multilevel degenerative disc disease and facet arthrosis with resulting canal stenosis at L2-3 through L4-5. Changes most pronounced at the L4-5 level were there is severe spinal  stenosis -Serum B12 normal. Serum copper within normal limit  -Neurology and neurosurgery consulted -Underwent  C3-4, C5-6, C6-7 anterior cervical discectomy with interbody fusion on 11/12 with Dr. Jordan Likes -improving postoperatively, plan for CIR for rehabilitation -I called and discussed resuming anticoagulation Xarelto with Dr. Jordan Likes this afternoon, he felt that we need to hold anticoagulation for 5 days postop and then consider risk-benefit analysis including fall risk in this lady before starting anticoagulation. -discharge to CIR for rehabilitation  Paroxysmal A fib -Sinus rhythm currently -Holding xarelto, see above discussion regarding anticoagulation with Dr. Jordan Likes today, id not resume any anticoagulation before 11/17 and thereafter only after weighing risks and benefits, and benefits outweigh risks  -Continue amiodarone, toprol  Chronic diastolic HF -Stable, euvolemic   CKD stage 3 -Baseline Cr 1 -Stable, monitor   HTN -Continue norvasc, toprol   Consultants:   Neurology  Neurosurgery  Cardiology for surgical clearance   Procedures: Dr.Pool 11/12 PROCEDURE:  Procedure(s): ANTERIOR CERVICAL DECOMPRESSION/DISCECTOMY FUSION CERVICAL THREE-FOUR, CERVICAL FIVE-SIX, CERVICAL SIX-SEVEN      Discharge Exam: Vitals:   05/26/17 1510 05/26/17 1551  BP: (!) 134/56   Pulse: 73   Resp: 16   Temp: 97.7 F (36.5 C)   SpO2: 96% 97%    General: AAOx3 Cardiovascular: S1S2/RRR Respiratory: CTAB  Discharge Instructions    Current Discharge Medication List    CONTINUE these medications which have NOT CHANGED   Details  albuterol (VENTOLIN HFA) 108 (90 Base) MCG/ACT inhaler Inhale 2 puffs daily as needed into the lungs for wheezing or shortness of breath.     ALPRAZolam (XANAX) 0.25 MG tablet Take 0.25 mg at bedtime as needed by mouth for anxiety or sleep.     amiodarone (PACERONE) 200 MG tablet Take 200 mg at bedtime by mouth.     amLODipine (  NORVASC) 5 MG  tablet Take 2.5 mg daily after breakfast by mouth.     bisacodyl (DULCOLAX) 10 MG suppository Place 10 mg daily rectally.    cetirizine (ZYRTEC) 10 MG tablet Take 10 mg by mouth daily.    conjugated estrogens (PREMARIN) vaginal cream Place 1 application at bedtime as needed vaginally.    Cyanocobalamin (VITAMIN B-12 IJ) Inject 1 each every 30 (thirty) days as directed.    !! estradiol (ESTRACE) 0.5 MG tablet Take 0.5 mg daily by mouth.    fluticasone (FLONASE) 50 MCG/ACT nasal spray Place 1 spray daily as needed into both nostrils for allergies.     metoprolol succinate (TOPROL-XL) 100 MG 24 hr tablet Take 100 mg 2 (two) times daily by mouth.     pantoprazole (PROTONIX) 40 MG tablet Take 40 mg by mouth daily.    rOPINIRole (REQUIP) 0.5 MG tablet Take 1 mg by mouth at bedtime.    XARELTO 15 MG TABS tablet Take 15 mg daily by mouth.     acebutolol (SECTRAL) 400 MG capsule Take 400 mg by mouth daily after breakfast.     acetaminophen (TYLENOL) 500 MG tablet Take 500 mg by mouth every 6 (six) hours as needed for pain.    aspirin EC 81 MG EC tablet Take 1 tablet (81 mg total) by mouth daily. Qty: 30 tablet, Refills: 0    !! estradiol (ESTRACE) 1 MG tablet Take 1 mg by mouth daily.    !! gabapentin (NEURONTIN) 300 MG capsule Take 600 mg by mouth daily.    !! gabapentin (NEURONTIN) 300 MG capsule Take 1 capsule (300 mg total) by mouth 2 (two) times daily. Qty: 90 capsule, Refills: 6    Omeprazole-Sodium Bicarbonate (ZEGERID) 20-1100 MG CAPS Take 1 capsule by mouth at bedtime.      !! - Potential duplicate medications found. Please discuss with provider.     Allergies  Allergen Reactions  . Gabapentin     Cardiac concerns (d/c by cardiologist)  . Hctz [Hydrochlorothiazide]     Dizziness   . Lasix [Furosemide]     fatigue   . Penicillins Hives and Itching  . Tramadol-Acetaminophen     GI upset       The results of significant diagnostics from this hospitalization  (including imaging, microbiology, ancillary and laboratory) are listed below for reference.    Significant Diagnostic Studies: Dg Cervical Spine 1 View  Result Date: 05/24/2017 CLINICAL DATA:  Intraoperative fluoroscopy provided for anterior cervical decompression and fusion. EXAM: CERVICAL SPINE 1 VIEW COMPARISON:  None. FINDINGS: Initial image shows placement of a surgical needle superimpose over the C3-C4 disc. Followup image shows placement of anterior fusion plate spanning Z6-X0 and spanning C5 through C7, with radiolucent disc spacers at C3-C4, C5-C6 and C6-C7. The orthopedic hardware appears well seated and aligned. Mature bony fusion is seen between the C4 and C5 vertebra. IMPRESSION: Fluoroscopic imaging provided for anterior cervical disc fusion, C3-C4 and C5 through C7. Electronically Signed   By: Amie Portland M.D.   On: 05/24/2017 20:36   Mr Brain Wo Contrast  Result Date: 05/21/2017 CLINICAL DATA:  Initial evaluation for acute right upper and lower extremity weakness. EXAM: MRI HEAD WITHOUT CONTRAST MRI CERVICAL, THORACIC AND LUMBAR SPINE WITHOUT CONTRAST TECHNIQUE: Multiplanar and multiecho pulse sequences of the cervical spine, to include the craniocervical junction and cervicothoracic junction, and thoracic and lumbar spine, were obtained without intravenous contrast. COMPARISON:  Prior radiograph from 09/16/2015. FINDINGS: MRI HEAD FINDINGS Generalized age related  cerebral atrophy. Mild chronic microvascular ischemic disease. No evidence for acute or subacute ischemic infarct. Gray-white matter differentiation maintained. No encephalomalacia to suggest chronic infarction. No foci of susceptibility artifact to suggest acute or chronic intracranial hemorrhage. No mass lesion, midline shift or mass effect. No hydrocephalus. No extra-axial fluid collection. Major dural sinuses are grossly patent. Pituitary gland within normal limits. Midline structures intact and normal. Major intracranial  vascular flow voids are maintained. Right vertebral artery dominant with hypoplastic left vertebral artery. Degenerative thickening of the tectorial membrane with secondary narrowing at the craniocervical junction. Bone marrow signal intensity within normal limits. No scalp soft tissue abnormality. Globes and oval soft tissues within normal limits. Patient status post lens extraction bilaterally. Mild scattered mucosal thickening within the ethmoidal air cells. Chronic right sphenoid sinusitis noted. Paranasal sinuses otherwise clear. Small bilateral mastoid effusions, slightly larger on the left, of doubtful significance. Inner ear structures normal. MRI CERVICAL SPINE FINDINGS Alignment: Straightening of the normal cervical lordosis. Trace anterolisthesis of C3 on C4. 3 mm anterolisthesis of C7 on T1. Vertebrae: Vertebral body heights maintained without evidence for acute or chronic fracture. Prominent reactive endplate changes present about the C5-6 and C6-7 interspaces. C4 and C5 vertebral bodies partially ankylosed due to chronic degenerative height loss at the C4-5 disc. Bone marrow signal intensity within normal limits. No discrete or worrisome osseous lesions. Reactive edema about the left C4-5 facet as well as the right C2-3 through C5-6 facets due to facet arthritis, most prevalent at C5-6 on the right (series 15, image 2). Cord: Possible tiny focus of T2 signal abnormality within the cervical spinal cord at the level of C6, which may reflect a small focus of myelomalacia (series 12, image 9). Signal intensity within the cervical spinal cord otherwise within normal limits. Cord is somewhat atrophic in appearance due to severe multifocal stenosis. Posterior Fossa, vertebral arteries, paraspinal tissues: Degenerative changes seen about the C1-2 articulation with thickening of the tectorial membrane. Superimposed 4 mm synovial cyst noted (series 12, image 9). Secondary mild narrowing at the craniocervical  junction. Paraspinous soft tissues within normal limits. Normal intravascular flow voids within the vertebral arteries grossly maintained. Disc levels: C2-C3: Intervertebral disc space narrowing with disc desiccation. Bilateral uncovertebral hypertrophy with facet degeneration, greater on the left. Mild spinal stenosis. Moderate left with mild right C3 foraminal narrowing. C3-C4: Diffuse disc bulge with intervertebral disc space narrowing. Facet ligamentum flavum hypertrophy. Severe spinal stenosis with compression of the spinal cord. Thecal sac measures 4 mm in AP diameter. Severe bilateral C4 foraminal stenosis. C4-C5: Chronic disc space height loss with partial ankylosis of the C4 and C5 vertebral bodies. Broad central disc osteophyte indents the ventral thecal sac and impinges upon the ventral spinal cord. Moderate spinal stenosis. Severe bilateral C5 foraminal stenosis. C5-C6: Chronic diffuse degenerative disc osteophyte with intervertebral disc space narrowing. Facet ligamentum flavum hypertrophy. Severe canal stenosis with compression of the spinal cord. Thecal sac approximately 4 mm in AP diameter. Severe C6 foraminal stenosis. C6-C7: Chronic diffuse degenerative disc osteophyte and facet hypertrophy with resultant severe spinal stenosis. Impression of the cervical spinal cord, similar to additional levels. Severe bilateral C7 foraminal stenosis. C7-T1: Anterolisthesis. Diffuse disc bulge. Advanced facet arthropathy bilaterally. Mild spinal stenosis. Moderate bilateral C8 foraminal narrowing. MRI THORACIC SPINE FINDINGS Alignment: Mild dextroscoliosis. Vertebral bodies otherwise normally aligned with preservation of the normal thoracic kyphosis. Trace anterolisthesis of T1 on T2 noted. Vertebrae: Vertebral body heights maintained without evidence for acute or chronic fracture. Prominent reactive endplate changes  noted about the C6-7 interspace. Degenerative disc space height loss with partial ankylosis  present at T7-8 anteriorly. Bone marrow signal intensity within normal limits. No worrisome osseous lesions. Cord: Signal intensity within the thoracic spinal cord is normal. The Paraspinal and other soft tissues: Paraspinous soft tissues within normal limits. Partially visualized lungs are grossly clear. Atherosclerotic change noted within the aorta. Visualized visceral structures within normal limits. Disc levels: Multilevel noncompressive disc bulging seen within the thoracic spine at nearly all levels. Note made of a right paracentral disc protrusion at T5-6 flattening the right hemi cord without cord signal changes (series 24, image 14). Left paracentral disc protrusion at T9-10 indents the left ventral thecal sac without stenosis or cord deformity (series 24, image 26). Multilevel facet arthropathy present within the lower thoracic spine, most notable at T11-12. No significant spinal stenosis. Mild to moderate bilateral foraminal narrowing present at T2-3 and T10-11. No other significant foraminal encroachment. MRI LUMBAR SPINE FINDINGS Segmentation:  Normal. Alignment: Straightening with reversal of the normal lumbar lordosis. Chronic mild retrolisthesis of L1 on L2 through L5-S1. Vertebrae: Vertebral body heights maintained without evidence for acute or chronic fracture. Prominent reactive endplate changes present about the L1-2 and L4-5 interspaces. Bone marrow signal intensity normal. No discrete or worrisome osseous lesions. Conus medullaris: Mildly low lying extending to the L2-3 level. Distal spinal cord and conus are normal in appearance. Paraspinal and other soft tissues: Paraspinous soft tissues demonstrate no acute abnormality. Chronic fatty atrophy noted within the lower paraspinous musculature. Visualized visceral structures within normal limits. Disc levels: L1-2: Diffuse disc bulge with disc desiccation and intervertebral disc space narrowing. Disc bulging eccentric to the right with associated  right far lateral reactive endplate changes. Mild right lateral recess narrowing without significant canal stenosis. Foramina are patent. L2-3: Chronic intervertebral disc space narrowing with diffuse disc bulge and reactive endplate changes. Moderate facet hypertrophy. Resultant mild canal with moderate bilateral lateral recess narrowing. Moderate bilateral L2 foraminal stenosis. L3-4: Diffuse disc bulge with intervertebral disc space narrowing. Moderate facet arthrosis with ligamentum flavum hypertrophy. Resultant moderate to severe canal and bilateral subarticular stenosis. Thecal sac measures 8 mm in AP diameter. Moderate bilateral L3 foraminal stenosis, slightly worse on the right. L4-5: Chronic intervertebral disc space narrowing with diffuse disc bulge. Moderate to advanced facet arthrosis with ligamentum flavum hypertrophy. Severe canal and bilateral subarticular stenosis. Thecal sac measures approximately 4-5 mm in AP diameter. Moderate bilateral L4 foraminal stenosis. L5-S1: Diffuse disc bulge with chronic intervertebral disc space narrowing. Superimposed small central disc protrusion indents the ventral thecal sac. Mild facet hypertrophy. No significant canal stenosis. Mild right L5 foraminal narrowing. IMPRESSION: MRI HEAD SPINE IMPRESSION: 1. No acute intracranial process identified. 2. Age-related cerebral atrophy with mild chronic small vessel ischemic disease. 3. Chronic right sphenoid sinusitis. MRI CERVICAL SPINE IMPRESSION: 1. 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 2. Question faint focus of myelomalacia within the cervical spinal cord at the level of C6 as above. 3. Multifactorial degenerative changes with resultant severe multilevel foraminal narrowing as above, severe bilaterally at C3-4 through C6-7. 4. Right greater than left facet arthritis with associated reactive edema as above. Finding could serve as a source for  neck pain. MRI THORACIC SPINE IMPRESSION: 1. Multilevel noncompressive disc bulging throughout the thoracic spine without significant canal stenosis. Superimposed small disc protrusions at T5-6 and T9-10 as above. 2. Multilevel facet arthropathy within the lower thoracic spine, most prevalent at T11-12. 3.  Prominent discogenic reactive endplate changes at C6-7. MRI LUMBAR SPINE IMPRESSION: 1. Multilevel degenerative disc disease and facet arthrosis with resulting canal stenosis at L2-3 through L4-5. Changes most pronounced at the L4-5 level were there is severe spinal stenosis. 2. Multifactorial degenerative changes with resultant moderate multilevel foraminal narrowing at L2-3 through L4-5 bilaterally. Electronically Signed   By: Rise Mu M.D.   On: 05/21/2017 22:44   Mr Cervical Spine Wo Contrast  Result Date: 05/21/2017 CLINICAL DATA:  Initial evaluation for acute right upper and lower extremity weakness. EXAM: MRI HEAD WITHOUT CONTRAST MRI CERVICAL, THORACIC AND LUMBAR SPINE WITHOUT CONTRAST TECHNIQUE: Multiplanar and multiecho pulse sequences of the cervical spine, to include the craniocervical junction and cervicothoracic junction, and thoracic and lumbar spine, were obtained without intravenous contrast. COMPARISON:  Prior radiograph from 09/16/2015. FINDINGS: MRI HEAD FINDINGS Generalized age related cerebral atrophy. Mild chronic microvascular ischemic disease. No evidence for acute or subacute ischemic infarct. Gray-white matter differentiation maintained. No encephalomalacia to suggest chronic infarction. No foci of susceptibility artifact to suggest acute or chronic intracranial hemorrhage. No mass lesion, midline shift or mass effect. No hydrocephalus. No extra-axial fluid collection. Major dural sinuses are grossly patent. Pituitary gland within normal limits. Midline structures intact and normal. Major intracranial vascular flow voids are maintained. Right vertebral artery dominant  with hypoplastic left vertebral artery. Degenerative thickening of the tectorial membrane with secondary narrowing at the craniocervical junction. Bone marrow signal intensity within normal limits. No scalp soft tissue abnormality. Globes and oval soft tissues within normal limits. Patient status post lens extraction bilaterally. Mild scattered mucosal thickening within the ethmoidal air cells. Chronic right sphenoid sinusitis noted. Paranasal sinuses otherwise clear. Small bilateral mastoid effusions, slightly larger on the left, of doubtful significance. Inner ear structures normal. MRI CERVICAL SPINE FINDINGS Alignment: Straightening of the normal cervical lordosis. Trace anterolisthesis of C3 on C4. 3 mm anterolisthesis of C7 on T1. Vertebrae: Vertebral body heights maintained without evidence for acute or chronic fracture. Prominent reactive endplate changes present about the C5-6 and C6-7 interspaces. C4 and C5 vertebral bodies partially ankylosed due to chronic degenerative height loss at the C4-5 disc. Bone marrow signal intensity within normal limits. No discrete or worrisome osseous lesions. Reactive edema about the left C4-5 facet as well as the right C2-3 through C5-6 facets due to facet arthritis, most prevalent at C5-6 on the right (series 15, image 2). Cord: Possible tiny focus of T2 signal abnormality within the cervical spinal cord at the level of C6, which may reflect a small focus of myelomalacia (series 12, image 9). Signal intensity within the cervical spinal cord otherwise within normal limits. Cord is somewhat atrophic in appearance due to severe multifocal stenosis. Posterior Fossa, vertebral arteries, paraspinal tissues: Degenerative changes seen about the C1-2 articulation with thickening of the tectorial membrane. Superimposed 4 mm synovial cyst noted (series 12, image 9). Secondary mild narrowing at the craniocervical junction. Paraspinous soft tissues within normal limits. Normal  intravascular flow voids within the vertebral arteries grossly maintained. Disc levels: C2-C3: Intervertebral disc space narrowing with disc desiccation. Bilateral uncovertebral hypertrophy with facet degeneration, greater on the left. Mild spinal stenosis. Moderate left with mild right C3 foraminal narrowing. C3-C4: Diffuse disc bulge with intervertebral disc space narrowing. Facet ligamentum flavum hypertrophy. Severe spinal stenosis with compression of the spinal cord. Thecal sac measures 4 mm in AP diameter. Severe bilateral C4 foraminal stenosis. C4-C5: Chronic disc space height loss with partial ankylosis of the C4 and C5 vertebral bodies. Broad  central disc osteophyte indents the ventral thecal sac and impinges upon the ventral spinal cord. Moderate spinal stenosis. Severe bilateral C5 foraminal stenosis. C5-C6: Chronic diffuse degenerative disc osteophyte with intervertebral disc space narrowing. Facet ligamentum flavum hypertrophy. Severe canal stenosis with compression of the spinal cord. Thecal sac approximately 4 mm in AP diameter. Severe C6 foraminal stenosis. C6-C7: Chronic diffuse degenerative disc osteophyte and facet hypertrophy with resultant severe spinal stenosis. Impression of the cervical spinal cord, similar to additional levels. Severe bilateral C7 foraminal stenosis. C7-T1: Anterolisthesis. Diffuse disc bulge. Advanced facet arthropathy bilaterally. Mild spinal stenosis. Moderate bilateral C8 foraminal narrowing. MRI THORACIC SPINE FINDINGS Alignment: Mild dextroscoliosis. Vertebral bodies otherwise normally aligned with preservation of the normal thoracic kyphosis. Trace anterolisthesis of T1 on T2 noted. Vertebrae: Vertebral body heights maintained without evidence for acute or chronic fracture. Prominent reactive endplate changes noted about the C6-7 interspace. Degenerative disc space height loss with partial ankylosis present at T7-8 anteriorly. Bone marrow signal intensity within  normal limits. No worrisome osseous lesions. Cord: Signal intensity within the thoracic spinal cord is normal. The Paraspinal and other soft tissues: Paraspinous soft tissues within normal limits. Partially visualized lungs are grossly clear. Atherosclerotic change noted within the aorta. Visualized visceral structures within normal limits. Disc levels: Multilevel noncompressive disc bulging seen within the thoracic spine at nearly all levels. Note made of a right paracentral disc protrusion at T5-6 flattening the right hemi cord without cord signal changes (series 24, image 14). Left paracentral disc protrusion at T9-10 indents the left ventral thecal sac without stenosis or cord deformity (series 24, image 26). Multilevel facet arthropathy present within the lower thoracic spine, most notable at T11-12. No significant spinal stenosis. Mild to moderate bilateral foraminal narrowing present at T2-3 and T10-11. No other significant foraminal encroachment. MRI LUMBAR SPINE FINDINGS Segmentation:  Normal. Alignment: Straightening with reversal of the normal lumbar lordosis. Chronic mild retrolisthesis of L1 on L2 through L5-S1. Vertebrae: Vertebral body heights maintained without evidence for acute or chronic fracture. Prominent reactive endplate changes present about the L1-2 and L4-5 interspaces. Bone marrow signal intensity normal. No discrete or worrisome osseous lesions. Conus medullaris: Mildly low lying extending to the L2-3 level. Distal spinal cord and conus are normal in appearance. Paraspinal and other soft tissues: Paraspinous soft tissues demonstrate no acute abnormality. Chronic fatty atrophy noted within the lower paraspinous musculature. Visualized visceral structures within normal limits. Disc levels: L1-2: Diffuse disc bulge with disc desiccation and intervertebral disc space narrowing. Disc bulging eccentric to the right with associated right far lateral reactive endplate changes. Mild right lateral  recess narrowing without significant canal stenosis. Foramina are patent. L2-3: Chronic intervertebral disc space narrowing with diffuse disc bulge and reactive endplate changes. Moderate facet hypertrophy. Resultant mild canal with moderate bilateral lateral recess narrowing. Moderate bilateral L2 foraminal stenosis. L3-4: Diffuse disc bulge with intervertebral disc space narrowing. Moderate facet arthrosis with ligamentum flavum hypertrophy. Resultant moderate to severe canal and bilateral subarticular stenosis. Thecal sac measures 8 mm in AP diameter. Moderate bilateral L3 foraminal stenosis, slightly worse on the right. L4-5: Chronic intervertebral disc space narrowing with diffuse disc bulge. Moderate to advanced facet arthrosis with ligamentum flavum hypertrophy. Severe canal and bilateral subarticular stenosis. Thecal sac measures approximately 4-5 mm in AP diameter. Moderate bilateral L4 foraminal stenosis. L5-S1: Diffuse disc bulge with chronic intervertebral disc space narrowing. Superimposed small central disc protrusion indents the ventral thecal sac. Mild facet hypertrophy. No significant canal stenosis. Mild right L5 foraminal narrowing. IMPRESSION:  MRI HEAD SPINE IMPRESSION: 1. No acute intracranial process identified. 2. Age-related cerebral atrophy with mild chronic small vessel ischemic disease. 3. Chronic right sphenoid sinusitis. MRI CERVICAL SPINE IMPRESSION: 1. 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 2. Question faint focus of myelomalacia within the cervical spinal cord at the level of C6 as above. 3. Multifactorial degenerative changes with resultant severe multilevel foraminal narrowing as above, severe bilaterally at C3-4 through C6-7. 4. Right greater than left facet arthritis with associated reactive edema as above. Finding could serve as a source for neck pain. MRI THORACIC SPINE IMPRESSION: 1. Multilevel  noncompressive disc bulging throughout the thoracic spine without significant canal stenosis. Superimposed small disc protrusions at T5-6 and T9-10 as above. 2. Multilevel facet arthropathy within the lower thoracic spine, most prevalent at T11-12. 3. Prominent discogenic reactive endplate changes at C6-7. MRI LUMBAR SPINE IMPRESSION: 1. Multilevel degenerative disc disease and facet arthrosis with resulting canal stenosis at L2-3 through L4-5. Changes most pronounced at the L4-5 level were there is severe spinal stenosis. 2. Multifactorial degenerative changes with resultant moderate multilevel foraminal narrowing at L2-3 through L4-5 bilaterally. Electronically Signed   By: Rise Mu M.D.   On: 05/21/2017 22:44   Mr Thoracic Spine Wo Contrast  Result Date: 05/21/2017 CLINICAL DATA:  Initial evaluation for acute right upper and lower extremity weakness. EXAM: MRI HEAD WITHOUT CONTRAST MRI CERVICAL, THORACIC AND LUMBAR SPINE WITHOUT CONTRAST TECHNIQUE: Multiplanar and multiecho pulse sequences of the cervical spine, to include the craniocervical junction and cervicothoracic junction, and thoracic and lumbar spine, were obtained without intravenous contrast. COMPARISON:  Prior radiograph from 09/16/2015. FINDINGS: MRI HEAD FINDINGS Generalized age related cerebral atrophy. Mild chronic microvascular ischemic disease. No evidence for acute or subacute ischemic infarct. Gray-white matter differentiation maintained. No encephalomalacia to suggest chronic infarction. No foci of susceptibility artifact to suggest acute or chronic intracranial hemorrhage. No mass lesion, midline shift or mass effect. No hydrocephalus. No extra-axial fluid collection. Major dural sinuses are grossly patent. Pituitary gland within normal limits. Midline structures intact and normal. Major intracranial vascular flow voids are maintained. Right vertebral artery dominant with hypoplastic left vertebral artery. Degenerative  thickening of the tectorial membrane with secondary narrowing at the craniocervical junction. Bone marrow signal intensity within normal limits. No scalp soft tissue abnormality. Globes and oval soft tissues within normal limits. Patient status post lens extraction bilaterally. Mild scattered mucosal thickening within the ethmoidal air cells. Chronic right sphenoid sinusitis noted. Paranasal sinuses otherwise clear. Small bilateral mastoid effusions, slightly larger on the left, of doubtful significance. Inner ear structures normal. MRI CERVICAL SPINE FINDINGS Alignment: Straightening of the normal cervical lordosis. Trace anterolisthesis of C3 on C4. 3 mm anterolisthesis of C7 on T1. Vertebrae: Vertebral body heights maintained without evidence for acute or chronic fracture. Prominent reactive endplate changes present about the C5-6 and C6-7 interspaces. C4 and C5 vertebral bodies partially ankylosed due to chronic degenerative height loss at the C4-5 disc. Bone marrow signal intensity within normal limits. No discrete or worrisome osseous lesions. Reactive edema about the left C4-5 facet as well as the right C2-3 through C5-6 facets due to facet arthritis, most prevalent at C5-6 on the right (series 15, image 2). Cord: Possible tiny focus of T2 signal abnormality within the cervical spinal cord at the level of C6, which may reflect a small focus of myelomalacia (series 12, image 9). Signal intensity within the cervical spinal cord otherwise within normal limits.  Cord is somewhat atrophic in appearance due to severe multifocal stenosis. Posterior Fossa, vertebral arteries, paraspinal tissues: Degenerative changes seen about the C1-2 articulation with thickening of the tectorial membrane. Superimposed 4 mm synovial cyst noted (series 12, image 9). Secondary mild narrowing at the craniocervical junction. Paraspinous soft tissues within normal limits. Normal intravascular flow voids within the vertebral arteries  grossly maintained. Disc levels: C2-C3: Intervertebral disc space narrowing with disc desiccation. Bilateral uncovertebral hypertrophy with facet degeneration, greater on the left. Mild spinal stenosis. Moderate left with mild right C3 foraminal narrowing. C3-C4: Diffuse disc bulge with intervertebral disc space narrowing. Facet ligamentum flavum hypertrophy. Severe spinal stenosis with compression of the spinal cord. Thecal sac measures 4 mm in AP diameter. Severe bilateral C4 foraminal stenosis. C4-C5: Chronic disc space height loss with partial ankylosis of the C4 and C5 vertebral bodies. Broad central disc osteophyte indents the ventral thecal sac and impinges upon the ventral spinal cord. Moderate spinal stenosis. Severe bilateral C5 foraminal stenosis. C5-C6: Chronic diffuse degenerative disc osteophyte with intervertebral disc space narrowing. Facet ligamentum flavum hypertrophy. Severe canal stenosis with compression of the spinal cord. Thecal sac approximately 4 mm in AP diameter. Severe C6 foraminal stenosis. C6-C7: Chronic diffuse degenerative disc osteophyte and facet hypertrophy with resultant severe spinal stenosis. Impression of the cervical spinal cord, similar to additional levels. Severe bilateral C7 foraminal stenosis. C7-T1: Anterolisthesis. Diffuse disc bulge. Advanced facet arthropathy bilaterally. Mild spinal stenosis. Moderate bilateral C8 foraminal narrowing. MRI THORACIC SPINE FINDINGS Alignment: Mild dextroscoliosis. Vertebral bodies otherwise normally aligned with preservation of the normal thoracic kyphosis. Trace anterolisthesis of T1 on T2 noted. Vertebrae: Vertebral body heights maintained without evidence for acute or chronic fracture. Prominent reactive endplate changes noted about the C6-7 interspace. Degenerative disc space height loss with partial ankylosis present at T7-8 anteriorly. Bone marrow signal intensity within normal limits. No worrisome osseous lesions. Cord: Signal  intensity within the thoracic spinal cord is normal. The Paraspinal and other soft tissues: Paraspinous soft tissues within normal limits. Partially visualized lungs are grossly clear. Atherosclerotic change noted within the aorta. Visualized visceral structures within normal limits. Disc levels: Multilevel noncompressive disc bulging seen within the thoracic spine at nearly all levels. Note made of a right paracentral disc protrusion at T5-6 flattening the right hemi cord without cord signal changes (series 24, image 14). Left paracentral disc protrusion at T9-10 indents the left ventral thecal sac without stenosis or cord deformity (series 24, image 26). Multilevel facet arthropathy present within the lower thoracic spine, most notable at T11-12. No significant spinal stenosis. Mild to moderate bilateral foraminal narrowing present at T2-3 and T10-11. No other significant foraminal encroachment. MRI LUMBAR SPINE FINDINGS Segmentation:  Normal. Alignment: Straightening with reversal of the normal lumbar lordosis. Chronic mild retrolisthesis of L1 on L2 through L5-S1. Vertebrae: Vertebral body heights maintained without evidence for acute or chronic fracture. Prominent reactive endplate changes present about the L1-2 and L4-5 interspaces. Bone marrow signal intensity normal. No discrete or worrisome osseous lesions. Conus medullaris: Mildly low lying extending to the L2-3 level. Distal spinal cord and conus are normal in appearance. Paraspinal and other soft tissues: Paraspinous soft tissues demonstrate no acute abnormality. Chronic fatty atrophy noted within the lower paraspinous musculature. Visualized visceral structures within normal limits. Disc levels: L1-2: Diffuse disc bulge with disc desiccation and intervertebral disc space narrowing. Disc bulging eccentric to the right with associated right far lateral reactive endplate changes. Mild right lateral recess narrowing without significant canal stenosis.  Foramina are  patent. L2-3: Chronic intervertebral disc space narrowing with diffuse disc bulge and reactive endplate changes. Moderate facet hypertrophy. Resultant mild canal with moderate bilateral lateral recess narrowing. Moderate bilateral L2 foraminal stenosis. L3-4: Diffuse disc bulge with intervertebral disc space narrowing. Moderate facet arthrosis with ligamentum flavum hypertrophy. Resultant moderate to severe canal and bilateral subarticular stenosis. Thecal sac measures 8 mm in AP diameter. Moderate bilateral L3 foraminal stenosis, slightly worse on the right. L4-5: Chronic intervertebral disc space narrowing with diffuse disc bulge. Moderate to advanced facet arthrosis with ligamentum flavum hypertrophy. Severe canal and bilateral subarticular stenosis. Thecal sac measures approximately 4-5 mm in AP diameter. Moderate bilateral L4 foraminal stenosis. L5-S1: Diffuse disc bulge with chronic intervertebral disc space narrowing. Superimposed small central disc protrusion indents the ventral thecal sac. Mild facet hypertrophy. No significant canal stenosis. Mild right L5 foraminal narrowing. IMPRESSION: MRI HEAD SPINE IMPRESSION: 1. No acute intracranial process identified. 2. Age-related cerebral atrophy with mild chronic small vessel ischemic disease. 3. Chronic right sphenoid sinusitis. MRI CERVICAL SPINE IMPRESSION: 1. 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 2. Question faint focus of myelomalacia within the cervical spinal cord at the level of C6 as above. 3. Multifactorial degenerative changes with resultant severe multilevel foraminal narrowing as above, severe bilaterally at C3-4 through C6-7. 4. Right greater than left facet arthritis with associated reactive edema as above. Finding could serve as a source for neck pain. MRI THORACIC SPINE IMPRESSION: 1. Multilevel noncompressive disc bulging throughout the thoracic spine  without significant canal stenosis. Superimposed small disc protrusions at T5-6 and T9-10 as above. 2. Multilevel facet arthropathy within the lower thoracic spine, most prevalent at T11-12. 3. Prominent discogenic reactive endplate changes at C6-7. MRI LUMBAR SPINE IMPRESSION: 1. Multilevel degenerative disc disease and facet arthrosis with resulting canal stenosis at L2-3 through L4-5. Changes most pronounced at the L4-5 level were there is severe spinal stenosis. 2. Multifactorial degenerative changes with resultant moderate multilevel foraminal narrowing at L2-3 through L4-5 bilaterally. Electronically Signed   By: Rise MuBenjamin  McClintock M.D.   On: 05/21/2017 22:44   Mr Lumbar Spine Wo Contrast  Result Date: 05/21/2017 CLINICAL DATA:  Initial evaluation for acute right upper and lower extremity weakness. EXAM: MRI HEAD WITHOUT CONTRAST MRI CERVICAL, THORACIC AND LUMBAR SPINE WITHOUT CONTRAST TECHNIQUE: Multiplanar and multiecho pulse sequences of the cervical spine, to include the craniocervical junction and cervicothoracic junction, and thoracic and lumbar spine, were obtained without intravenous contrast. COMPARISON:  Prior radiograph from 09/16/2015. FINDINGS: MRI HEAD FINDINGS Generalized age related cerebral atrophy. Mild chronic microvascular ischemic disease. No evidence for acute or subacute ischemic infarct. Gray-white matter differentiation maintained. No encephalomalacia to suggest chronic infarction. No foci of susceptibility artifact to suggest acute or chronic intracranial hemorrhage. No mass lesion, midline shift or mass effect. No hydrocephalus. No extra-axial fluid collection. Major dural sinuses are grossly patent. Pituitary gland within normal limits. Midline structures intact and normal. Major intracranial vascular flow voids are maintained. Right vertebral artery dominant with hypoplastic left vertebral artery. Degenerative thickening of the tectorial membrane with secondary narrowing at  the craniocervical junction. Bone marrow signal intensity within normal limits. No scalp soft tissue abnormality. Globes and oval soft tissues within normal limits. Patient status post lens extraction bilaterally. Mild scattered mucosal thickening within the ethmoidal air cells. Chronic right sphenoid sinusitis noted. Paranasal sinuses otherwise clear. Small bilateral mastoid effusions, slightly larger on the left, of doubtful significance. Inner ear structures normal. MRI CERVICAL  SPINE FINDINGS Alignment: Straightening of the normal cervical lordosis. Trace anterolisthesis of C3 on C4. 3 mm anterolisthesis of C7 on T1. Vertebrae: Vertebral body heights maintained without evidence for acute or chronic fracture. Prominent reactive endplate changes present about the C5-6 and C6-7 interspaces. C4 and C5 vertebral bodies partially ankylosed due to chronic degenerative height loss at the C4-5 disc. Bone marrow signal intensity within normal limits. No discrete or worrisome osseous lesions. Reactive edema about the left C4-5 facet as well as the right C2-3 through C5-6 facets due to facet arthritis, most prevalent at C5-6 on the right (series 15, image 2). Cord: Possible tiny focus of T2 signal abnormality within the cervical spinal cord at the level of C6, which may reflect a small focus of myelomalacia (series 12, image 9). Signal intensity within the cervical spinal cord otherwise within normal limits. Cord is somewhat atrophic in appearance due to severe multifocal stenosis. Posterior Fossa, vertebral arteries, paraspinal tissues: Degenerative changes seen about the C1-2 articulation with thickening of the tectorial membrane. Superimposed 4 mm synovial cyst noted (series 12, image 9). Secondary mild narrowing at the craniocervical junction. Paraspinous soft tissues within normal limits. Normal intravascular flow voids within the vertebral arteries grossly maintained. Disc levels: C2-C3: Intervertebral disc space  narrowing with disc desiccation. Bilateral uncovertebral hypertrophy with facet degeneration, greater on the left. Mild spinal stenosis. Moderate left with mild right C3 foraminal narrowing. C3-C4: Diffuse disc bulge with intervertebral disc space narrowing. Facet ligamentum flavum hypertrophy. Severe spinal stenosis with compression of the spinal cord. Thecal sac measures 4 mm in AP diameter. Severe bilateral C4 foraminal stenosis. C4-C5: Chronic disc space height loss with partial ankylosis of the C4 and C5 vertebral bodies. Broad central disc osteophyte indents the ventral thecal sac and impinges upon the ventral spinal cord. Moderate spinal stenosis. Severe bilateral C5 foraminal stenosis. C5-C6: Chronic diffuse degenerative disc osteophyte with intervertebral disc space narrowing. Facet ligamentum flavum hypertrophy. Severe canal stenosis with compression of the spinal cord. Thecal sac approximately 4 mm in AP diameter. Severe C6 foraminal stenosis. C6-C7: Chronic diffuse degenerative disc osteophyte and facet hypertrophy with resultant severe spinal stenosis. Impression of the cervical spinal cord, similar to additional levels. Severe bilateral C7 foraminal stenosis. C7-T1: Anterolisthesis. Diffuse disc bulge. Advanced facet arthropathy bilaterally. Mild spinal stenosis. Moderate bilateral C8 foraminal narrowing. MRI THORACIC SPINE FINDINGS Alignment: Mild dextroscoliosis. Vertebral bodies otherwise normally aligned with preservation of the normal thoracic kyphosis. Trace anterolisthesis of T1 on T2 noted. Vertebrae: Vertebral body heights maintained without evidence for acute or chronic fracture. Prominent reactive endplate changes noted about the C6-7 interspace. Degenerative disc space height loss with partial ankylosis present at T7-8 anteriorly. Bone marrow signal intensity within normal limits. No worrisome osseous lesions. Cord: Signal intensity within the thoracic spinal cord is normal. The Paraspinal  and other soft tissues: Paraspinous soft tissues within normal limits. Partially visualized lungs are grossly clear. Atherosclerotic change noted within the aorta. Visualized visceral structures within normal limits. Disc levels: Multilevel noncompressive disc bulging seen within the thoracic spine at nearly all levels. Note made of a right paracentral disc protrusion at T5-6 flattening the right hemi cord without cord signal changes (series 24, image 14). Left paracentral disc protrusion at T9-10 indents the left ventral thecal sac without stenosis or cord deformity (series 24, image 26). Multilevel facet arthropathy present within the lower thoracic spine, most notable at T11-12. No significant spinal stenosis. Mild to moderate bilateral foraminal narrowing present at T2-3 and T10-11. No other significant  foraminal encroachment. MRI LUMBAR SPINE FINDINGS Segmentation:  Normal. Alignment: Straightening with reversal of the normal lumbar lordosis. Chronic mild retrolisthesis of L1 on L2 through L5-S1. Vertebrae: Vertebral body heights maintained without evidence for acute or chronic fracture. Prominent reactive endplate changes present about the L1-2 and L4-5 interspaces. Bone marrow signal intensity normal. No discrete or worrisome osseous lesions. Conus medullaris: Mildly low lying extending to the L2-3 level. Distal spinal cord and conus are normal in appearance. Paraspinal and other soft tissues: Paraspinous soft tissues demonstrate no acute abnormality. Chronic fatty atrophy noted within the lower paraspinous musculature. Visualized visceral structures within normal limits. Disc levels: L1-2: Diffuse disc bulge with disc desiccation and intervertebral disc space narrowing. Disc bulging eccentric to the right with associated right far lateral reactive endplate changes. Mild right lateral recess narrowing without significant canal stenosis. Foramina are patent. L2-3: Chronic intervertebral disc space narrowing  with diffuse disc bulge and reactive endplate changes. Moderate facet hypertrophy. Resultant mild canal with moderate bilateral lateral recess narrowing. Moderate bilateral L2 foraminal stenosis. L3-4: Diffuse disc bulge with intervertebral disc space narrowing. Moderate facet arthrosis with ligamentum flavum hypertrophy. Resultant moderate to severe canal and bilateral subarticular stenosis. Thecal sac measures 8 mm in AP diameter. Moderate bilateral L3 foraminal stenosis, slightly worse on the right. L4-5: Chronic intervertebral disc space narrowing with diffuse disc bulge. Moderate to advanced facet arthrosis with ligamentum flavum hypertrophy. Severe canal and bilateral subarticular stenosis. Thecal sac measures approximately 4-5 mm in AP diameter. Moderate bilateral L4 foraminal stenosis. L5-S1: Diffuse disc bulge with chronic intervertebral disc space narrowing. Superimposed small central disc protrusion indents the ventral thecal sac. Mild facet hypertrophy. No significant canal stenosis. Mild right L5 foraminal narrowing. IMPRESSION: MRI HEAD SPINE IMPRESSION: 1. No acute intracranial process identified. 2. Age-related cerebral atrophy with mild chronic small vessel ischemic disease. 3. Chronic right sphenoid sinusitis. MRI CERVICAL SPINE IMPRESSION: 1. 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 2. Question faint focus of myelomalacia within the cervical spinal cord at the level of C6 as above. 3. Multifactorial degenerative changes with resultant severe multilevel foraminal narrowing as above, severe bilaterally at C3-4 through C6-7. 4. Right greater than left facet arthritis with associated reactive edema as above. Finding could serve as a source for neck pain. MRI THORACIC SPINE IMPRESSION: 1. Multilevel noncompressive disc bulging throughout the thoracic spine without significant canal stenosis. Superimposed small disc protrusions at  T5-6 and T9-10 as above. 2. Multilevel facet arthropathy within the lower thoracic spine, most prevalent at T11-12. 3. Prominent discogenic reactive endplate changes at C6-7. MRI LUMBAR SPINE IMPRESSION: 1. Multilevel degenerative disc disease and facet arthrosis with resulting canal stenosis at L2-3 through L4-5. Changes most pronounced at the L4-5 level were there is severe spinal stenosis. 2. Multifactorial degenerative changes with resultant moderate multilevel foraminal narrowing at L2-3 through L4-5 bilaterally. Electronically Signed   By: Rise Mu M.D.   On: 05/21/2017 22:44   Dg C-arm 1-60 Min  Result Date: 05/24/2017 CLINICAL DATA:  Intraoperative fluoroscopy provided for anterior cervical decompression and fusion. EXAM: CERVICAL SPINE 1 VIEW COMPARISON:  None. FINDINGS: Initial image shows placement of a surgical needle superimpose over the C3-C4 disc. Followup image shows placement of anterior fusion plate spanning Z6-X0 and spanning C5 through C7, with radiolucent disc spacers at C3-C4, C5-C6 and C6-C7. The orthopedic hardware appears well seated and aligned. Mature bony fusion is seen between the C4 and C5 vertebra. IMPRESSION: Fluoroscopic imaging  provided for anterior cervical disc fusion, C3-C4 and C5 through C7. Electronically Signed   By: Amie Portland M.D.   On: 05/24/2017 20:36    Microbiology: Recent Results (from the past 240 hour(s))  Surgical pcr screen     Status: None   Collection Time: 05/24/17  5:05 AM  Result Value Ref Range Status   MRSA, PCR NEGATIVE NEGATIVE Final   Staphylococcus aureus NEGATIVE NEGATIVE Final    Comment: (NOTE) The Xpert SA Assay (FDA approved for NASAL specimens in patients 43 years of age and older), is one component of a comprehensive surveillance program. It is not intended to diagnose infection nor to guide or monitor treatment.      Labs: Basic Metabolic Panel: Recent Labs  Lab 05/21/17 1055 05/22/17 0620 05/23/17 0345  05/24/17 0524 05/25/17 0552 05/26/17 0413  NA 133*  --  134* 134* 131* 133*  K 4.1  --  4.0 3.9 4.3 4.7  CL 101  --  103 101 99* 100*  CO2 24  --  24 26 23 27   GLUCOSE 111*  --  96 92 153* 107*  BUN 14  --  24* 20 20 32*  CREATININE 1.09* 1.24* 1.30* 1.21* 1.08* 1.22*  CALCIUM 9.0  --  8.6* 8.7* 8.9 8.6*   Liver Function Tests: No results for input(s): AST, ALT, ALKPHOS, BILITOT, PROT, ALBUMIN in the last 168 hours. No results for input(s): LIPASE, AMYLASE in the last 168 hours. No results for input(s): AMMONIA in the last 168 hours. CBC: Recent Labs  Lab 05/21/17 1055 05/22/17 0620 05/24/17 0524 05/25/17 0552 05/26/17 0413  WBC 4.1 6.0 5.4 7.9 8.7  HGB 14.5 14.1 13.4 13.1 11.8*  HCT 42.2 41.6 40.0 38.4 35.5*  MCV 90.9 91.2 92.8 91.9 92.2  PLT 198 209 184 197 161   Cardiac Enzymes: No results for input(s): CKTOTAL, CKMB, CKMBINDEX, TROPONINI in the last 168 hours. BNP: BNP (last 3 results) No results for input(s): BNP in the last 8760 hours.  ProBNP (last 3 results) No results for input(s): PROBNP in the last 8760 hours.  CBG: No results for input(s): GLUCAP in the last 168 hours.     SignedZannie Cove MD.  [facility] 05/26/2017, 3:55 PM

## 2017-05-26 NOTE — Progress Notes (Signed)
Physical Medicine and Rehabilitation Consult  Reason for Consult: Cervical myelopathy Referring Physician: Dr. Jordan Likes    HPI: [name] is a 83 y.o. female with history of HTN, CKD, CAF- on Xarelto , DDD with chronic back pain who was admitted on 05/22/17 with 2 week history of progressive right sided weakness and numbness with loss of use of right hand, right foot drop and loss of dexterity left hand.  History taken from chart review and daughter. MRI brain reviewed, unremarkable for acute process. Per report, without acute infarct and MRI spine done revealing severe cervical spondylosis with diffuse canal stenosis C3/4, C5/6 and C6/7, severe cord compression C3/4,  R> L reactive edema due to facet arthritis most prevalent at C5/6, multilevel compressive disc bulge throughout thoracic spine and multilevel change lumbar spine L2-L5 with severe spinal stenosis L4/5. She was evaluated by Dr. Jordan Likes who recommended cervical decompression due to myelopathy and she was cleared for surgery by Dr. Graciela Husbands. She underwent cervical decompression C3- C7 with ACDF on 05/24/17. Therapy ongoing and patient has had decline in functional status in last 3-4 weeks affecting mobility and ability to carry out ADLs. CIR recommended by MD.   Review of Systems  HENT: Positive for hearing loss.   Eyes: Positive for blurred vision (right eye). Negative for double vision.  Respiratory: Negative for cough and shortness of breath.   Cardiovascular: Negative for chest pain and palpitations.  Gastrointestinal: Negative for heartburn and nausea.  Genitourinary: Negative for dysuria and urgency.  Musculoskeletal: Positive for falls (Right knee instability for past month/ fall X 1), joint pain, myalgias and neck pain.  Skin: Negative for rash.  Neurological: Positive for sensory change, focal weakness, weakness and headaches. Negative for dizziness.  Psychiatric/Behavioral: Negative for memory loss. The patient is  not nervous/anxious.   All other systems reviewed and are negative.       Past Medical History:  Diagnosis Date  . Arthritis   . Chronic kidney disease   . Depression   . Dysrhythmia 8/13   palpitations/ now resolved  . GERD (gastroesophageal reflux disease)   . History of blood transfusion 2012  . Hypertension   . Peripheral vascular disease (HCC)    varicose veins bilaterally   . Restless legs syndrome           Past Surgical History:  Procedure Laterality Date  . ABDOMINAL HYSTERECTOMY    . APPENDECTOMY    . EYE SURGERY Bilateral    cataract extraction with IOL  . JOINT REPLACEMENT Right    knee  . SEPTOPLASTY     repair deviated septum  . VARICOSE VEIN SURGERY Bilateral           Family History  Problem Relation Age of Onset  . Heart attack Father   . Stroke Father   . Hypertension Father   . COPD Mother   . Lung cancer Sister   . Uterine cancer Sister   . Colon polyps Daughter   . Colon cancer Unknown      Social History:  Lives alone. Worked in a grocery store till age 77. Independent without AD till a month ago. Her family has been helping out in the past month --checking in and with meals. Her  daughter is a Engineer, civil (consulting) at [employer] and reports that family can provide 1?-2 weeks of supervision after discharge.  She reports that  has never smoked. she has never used smokeless tobacco. She reports that she does not drink  alcohol or use drugs.          Allergies  Allergen Reactions  . Hctz [Hydrochlorothiazide]     Dizziness   . Lasix [Furosemide]     fatigue   . Penicillins Hives and Itching  . Tramadol-Acetaminophen     GI upset           Medications Prior to Admission  Medication Sig Dispense Refill  . albuterol (VENTOLIN HFA) 108 (90 Base) MCG/ACT inhaler Inhale 2 puffs daily as needed into the lungs for wheezing or shortness of breath.     . ALPRAZolam (XANAX) 0.25 MG tablet Take 0.25 mg at  bedtime as needed by mouth for anxiety or sleep.     Marland Kitchen. amiodarone (PACERONE) 200 MG tablet Take 200 mg at bedtime by mouth.     Marland Kitchen. amLODipine (NORVASC) 5 MG tablet Take 2.5 mg daily after breakfast by mouth.     . bisacodyl (DULCOLAX) 10 MG suppository Place 10 mg daily rectally.    . cetirizine (ZYRTEC) 10 MG tablet Take 10 mg by mouth daily.    Marland Kitchen. conjugated estrogens (PREMARIN) vaginal cream Place 1 application at bedtime as needed vaginally.    . Cyanocobalamin (VITAMIN B-12 IJ) Inject 1 each every 30 (thirty) days as directed.    Marland Kitchen. estradiol (ESTRACE) 0.5 MG tablet Take 0.5 mg daily by mouth.    . fluticasone (FLONASE) 50 MCG/ACT nasal spray Place 1 spray daily as needed into both nostrils for allergies.     . metoprolol succinate (TOPROL-XL) 100 MG 24 hr tablet Take 100 mg 2 (two) times daily by mouth.     . pantoprazole (PROTONIX) 40 MG tablet Take 40 mg by mouth daily.    Marland Kitchen. rOPINIRole (REQUIP) 0.5 MG tablet Take 1 mg by mouth at bedtime.    Carlena Hurl. XARELTO 15 MG TABS tablet Take 15 mg daily by mouth.     Marland Kitchen. acebutolol (SECTRAL) 400 MG capsule Take 400 mg by mouth daily after breakfast.     . acetaminophen (TYLENOL) 500 MG tablet Take 500 mg by mouth every 6 (six) hours as needed for pain.    Marland Kitchen. aspirin EC 81 MG EC tablet Take 1 tablet (81 mg total) by mouth daily. 30 tablet 0  . estradiol (ESTRACE) 1 MG tablet Take 1 mg by mouth daily.    Marland Kitchen. gabapentin (NEURONTIN) 300 MG capsule Take 600 mg by mouth daily.    Marland Kitchen. gabapentin (NEURONTIN) 300 MG capsule Take 1 capsule (300 mg total) by mouth 2 (two) times daily. 90 capsule 6  . Omeprazole-Sodium Bicarbonate (ZEGERID) 20-1100 MG CAPS Take 1 capsule by mouth at bedtime.       Home: Home Living Family/patient expects to be discharged to:: Private residence Living Arrangements: Alone Available Help at Discharge: Family, Other (Comment)(going to talk to family about 24/7 assist) Type of Home: House Home Access:  Ramped entrance Home Layout: One level(with basement) Bathroom Shower/Tub: Tub only, Health visitorWalk-in shower Bathroom Toilet: Standard Home Equipment: Environmental consultantWalker - 2 wheels, Environmental consultantWalker - 4 wheels, [place] - single point, Government social research officerToilet riser  Lives With: Alone  Functional History: Prior Function Level of Independence: Independent with assistive device(s) Comments: pt reported that she ambulates with RW Functional Status:  Mobility: Bed Mobility Overal bed mobility: Needs Assistance Bed Mobility: Supine to Sit, Sit to Supine Supine to sit: Supervision Sit to supine: Supervision General bed mobility comments: sitting EOB on arrival to pts room Transfers Overall transfer level: Needs assistance Equipment used: Rolling walker (2  wheeled) Transfers: Sit to/from Stand Sit to Stand: Min guard Stand pivot transfers: Min guard General transfer comment: Min guard for safety.  Ambulation/Gait Ambulation/Gait assistance: Min assist Ambulation Distance (Feet): 12 Feet Assistive device: Rolling walker (2 wheeled) Gait Pattern/deviations: Step-through pattern, Decreased step length - right, Decreased step length - left, Decreased stride length, Decreased dorsiflexion - right(R foot drop) General Gait Details: 12 feet in room. pt with genu recurvatum on R LE during stance phase. Unwilling to ambulate farther secondary to fear of falling.  Gait velocity: decreased Gait velocity interpretation: Below normal speed for age/gender  ADL: ADL Overall ADL's : Needs assistance/impaired Eating/Feeding: Bed level, Set up Grooming: Wash/dry face, Set up, Sitting Lower Body Dressing: Moderate assistance, Sit to/from stand Toilet Transfer: Minimal assistance, Stand-pivot, RW, BSC Toilet Transfer Details (indicate cue type and reason): assist for sit to stand transfer Toileting- Clothing Manipulation and Hygiene: Sit to/from stand, Minimal assistance Functional mobility during ADLs: Minimal assistance, Rolling walker General ADL  Comments: Focused session on R hand FM and theraputty excercises  Cognition: Cognition Overall Cognitive Status: Within Functional Limits for tasks assessed Arousal/Alertness: Awake/alert Orientation Level: Oriented to person, Oriented to place, Oriented to time, Oriented to situation Memory: Impaired Memory Impairment: Retrieval deficit Awareness: Appears intact Problem Solving: Appears intact Safety/Judgment: Appears intact Cognition Arousal/Alertness: Awake/alert Behavior During Therapy: WFL for tasks assessed/performed Overall Cognitive Status: Within Functional Limits for tasks assessed General Comments: Slower processing and increased VCs for follow activity instructions. Feel this is close to baseline  Blood pressure (!) 148/68, pulse 80, temperature 98.1 F (36.7 C), temperature source Oral, resp. rate 15, height 5\' 7"  (1.702 m), weight 84.7 kg (186 lb 12.8 oz), SpO2 98 %. Physical Exam  Nursing note and vitals reviewed. Constitutional: She is oriented to person, place, and time. She appears well-developed and well-nourished. No distress.  HENT:  Head: Normocephalic and atraumatic.  Mouth/Throat: Oropharynx is clear and moist.  Eyes: Conjunctivae and EOM are normal. Pupils are equal, round, and reactive to light.  Neck: Normal range of motion. Neck supple.  Cardiovascular: Normal rate and regular rhythm.  Murmur heard. Respiratory: Effort normal and breath sounds normal. No respiratory distress. She has no wheezes.  +[place]  GI: Soft. Bowel sounds are normal. There is no tenderness.  Musculoskeletal: She exhibits no edema or tenderness.  Neurological: She is alert and oriented to person, place, and time.  HOH.  Speech clear.  Able to follow basic commands.  Motor: 4/5 throughout Sensation diminished to light touch b/l feet  Skin: Skin is warm and dry. She is not diaphoretic.  Psychiatric: She has a normal mood and affect. Her behavior is normal. Thought content normal.     LabResultsLast24Hours       Results for orders placed or performed during the hospital encounter of 05/21/17 (from the past 24 hour(s))  CBC     Status: None   Collection Time: 05/25/17  5:52 AM  Result Value Ref Range   WBC 7.9 4.0 - 10.5 K/uL   RBC 4.18 3.87 - 5.11 MIL/uL   Hemoglobin 13.1 12.0 - 15.0 g/dL   HCT 16.138.4 09.636.0 - 04.546.0 %   MCV 91.9 78.0 - 100.0 fL   MCH 31.3 26.0 - 34.0 pg   MCHC 34.1 30.0 - 36.0 g/dL   RDW 40.915.2 81.111.5 - 91.415.5 %   Platelets 197 150 - 400 K/uL  Basic metabolic panel     Status: Abnormal   Collection Time: 05/25/17  5:52 AM  Result  Value Ref Range   Sodium 131 (L) 135 - 145 mmol/L   Potassium 4.3 3.5 - 5.1 mmol/L   Chloride 99 (L) 101 - 111 mmol/L   CO2 23 22 - 32 mmol/L   Glucose, Bld 153 (H) 65 - 99 mg/dL   BUN 20 6 - 20 mg/dL   Creatinine, Ser 1.61 (H) 0.44 - 1.00 mg/dL   Calcium 8.9 8.9 - 09.6 mg/dL   GFR calc non Af Amer 45 (L) >60 mL/min   GFR calc Af Amer 52 (L) >60 mL/min   Anion gap 9 5 - 15      ImagingResults(Last48hours)  Dg Cervical Spine 1 View  Result Date: 05/24/2017 CLINICAL DATA:  Intraoperative fluoroscopy provided for anterior cervical decompression and fusion. EXAM: CERVICAL SPINE 1 VIEW COMPARISON:  None. FINDINGS: Initial image shows placement of a surgical needle superimpose over the C3-C4 disc. Followup image shows placement of anterior fusion plate spanning E4-V4 and spanning C5 through C7, with radiolucent disc spacers at C3-C4, C5-C6 and C6-C7. The orthopedic hardware appears well seated and aligned. Mature bony fusion is seen between the C4 and C5 vertebra. IMPRESSION: Fluoroscopic imaging provided for anterior cervical disc fusion, C3-C4 and C5 through C7. Electronically Signed   By: Amie Portland M.D.   On: 05/24/2017 20:36   Dg C-arm 1-60 Min  Result Date: 05/24/2017 CLINICAL DATA:  Intraoperative fluoroscopy provided for anterior cervical decompression and fusion. EXAM: CERVICAL  SPINE 1 VIEW COMPARISON:  None. FINDINGS: Initial image shows placement of a surgical needle superimpose over the C3-C4 disc. Followup image shows placement of anterior fusion plate spanning U9-W1 and spanning C5 through C7, with radiolucent disc spacers at C3-C4, C5-C6 and C6-C7. The orthopedic hardware appears well seated and aligned. Mature bony fusion is seen between the C4 and C5 vertebra. IMPRESSION: Fluoroscopic imaging provided for anterior cervical disc fusion, C3-C4 and C5 through C7. Electronically Signed   By: Amie Portland M.D.   On: 05/24/2017 20:36     Assessment/Plan: Diagnosis: Cervical myelopathy s/p fusion Labs and images independently reviewed.  Records reviewed and summated above.  1. Does the need for close, 24 hr/day medical supervision in concert with the patient's rehab needs make it unreasonable for this patient to be served in a less intensive setting? Yes  2. Co-Morbidities requiring supervision/potential complications: HTN (monitor and provide prns in accordance with increased physical exertion and pain), CKD (avoid nephrotoxic meds), CAF (cont meds), DDD with post-op pain (Biofeedback training with therapies to help reduce reliance on opiate pain medications, monitor pain control during therapies, and sedation at rest and titrate to maximum efficacy to ensure participation and gains in therapies), hyponatremia (cont to monitor, treat if necessary) 3. Due to safety, skin/wound care, disease management, pain management and patient education, does the patient require 24 hr/day rehab nursing? Yes 4. Does the patient require coordinated care of a physician, rehab nurse, PT (1-2 hrs/day, 5 days/week), OT (1-2 hrs/day, 5 days/week) and SLP (1-2 hrs/day, 5 days/week) to address physical and functional deficits in the context of the above medical diagnosis(es)? Yes Addressing deficits in the following areas: balance, endurance, locomotion, strength, transferring, bathing, dressing,  feeding, grooming, toileting, speech, swallowing and psychosocial support 5. Can the patient actively participate in an intensive therapy program of at least 3 hrs of therapy per day at least 5 days per week? Potentially 6. The potential for patient to make measurable gains while on inpatient rehab is excellent 7. Anticipated functional outcomes upon discharge from inpatient  rehab are supervision  with PT, supervision with OT, modified independent with SLP. 8. Estimated rehab length of stay to reach the above functional goals is: 10-14 days. 9. Anticipated D/C setting: Home 10. Anticipated post D/C treatments: HH therapy and Home excercise program 11. Overall Rehab/Functional Prognosis: good  RECOMMENDATIONS: This patient's condition is appropriate for continued rehabilitative care in the following setting: Likely CIR.  Will need post-op reeval by therapies.  Will also await final plan regarding lumbar stenosis. Patient has agreed to participate in recommended program. Yes Note that insurance prior authorization may be required for reimbursement for recommended care.  Comment: Rehab Admissions Coordinator to follow up.  Maryla Morrow, MD, ABPMR Jacquelynn Cree, [place] 11/13/

## 2017-05-26 NOTE — Progress Notes (Signed)
Postop day 2. Overall progressing well.  Minimal neck pain.  Swallowing better.  Continues to regain function in her right upper extremity and right lower extremity.  Able to stand and ambulate with assistance of walker yesterday.  Back pain well controlled.  Overall very happy with how she is progressing.  I do believe that she would benefit from inpatient rehabilitation as she continues to recover from her severe compressive myelopathy.  I have no plans to move forward with lumbar surgery  At this point.  Should her lumbar symptoms become more problematic then I would 1st treat with epidural steroid injections.  Patient is ready from my standpoint for inpatient rehabilitation when insurance surgical occasion occurs.

## 2017-05-27 ENCOUNTER — Inpatient Hospital Stay (HOSPITAL_COMMUNITY): Payer: Medicare Other

## 2017-05-27 ENCOUNTER — Inpatient Hospital Stay (HOSPITAL_COMMUNITY): Payer: Medicare Other | Admitting: Occupational Therapy

## 2017-05-27 ENCOUNTER — Inpatient Hospital Stay (HOSPITAL_COMMUNITY): Payer: Medicare Other | Admitting: Physical Therapy

## 2017-05-27 DIAGNOSIS — G8191 Hemiplegia, unspecified affecting right dominant side: Secondary | ICD-10-CM

## 2017-05-27 DIAGNOSIS — R609 Edema, unspecified: Secondary | ICD-10-CM

## 2017-05-27 DIAGNOSIS — G9520 Unspecified cord compression: Secondary | ICD-10-CM

## 2017-05-27 LAB — CBC WITH DIFFERENTIAL/PLATELET
Basophils Absolute: 0 10*3/uL (ref 0.0–0.1)
Basophils Relative: 0 %
EOS PCT: 2 %
Eosinophils Absolute: 0.2 10*3/uL (ref 0.0–0.7)
HEMATOCRIT: 35.4 % — AB (ref 36.0–46.0)
Hemoglobin: 11.7 g/dL — ABNORMAL LOW (ref 12.0–15.0)
LYMPHS ABS: 0.8 10*3/uL (ref 0.7–4.0)
LYMPHS PCT: 11 %
MCH: 30.6 pg (ref 26.0–34.0)
MCHC: 33.1 g/dL (ref 30.0–36.0)
MCV: 92.7 fL (ref 78.0–100.0)
MONO ABS: 1.7 10*3/uL — AB (ref 0.1–1.0)
Monocytes Relative: 22 %
Neutro Abs: 5 10*3/uL (ref 1.7–7.7)
Neutrophils Relative %: 65 %
PLATELETS: 168 10*3/uL (ref 150–400)
RBC: 3.82 MIL/uL — ABNORMAL LOW (ref 3.87–5.11)
RDW: 15.1 % (ref 11.5–15.5)
WBC: 7.6 10*3/uL (ref 4.0–10.5)

## 2017-05-27 LAB — COMPREHENSIVE METABOLIC PANEL
ALK PHOS: 53 U/L (ref 38–126)
ALT: 20 U/L (ref 14–54)
AST: 36 U/L (ref 15–41)
Albumin: 2.9 g/dL — ABNORMAL LOW (ref 3.5–5.0)
Anion gap: 7 (ref 5–15)
BILIRUBIN TOTAL: 0.8 mg/dL (ref 0.3–1.2)
BUN: 30 mg/dL — AB (ref 6–20)
CHLORIDE: 97 mmol/L — AB (ref 101–111)
CO2: 26 mmol/L (ref 22–32)
Calcium: 8.7 mg/dL — ABNORMAL LOW (ref 8.9–10.3)
Creatinine, Ser: 1.08 mg/dL — ABNORMAL HIGH (ref 0.44–1.00)
GFR calc Af Amer: 52 mL/min — ABNORMAL LOW (ref >60)
GFR calc non Af Amer: 45 mL/min — ABNORMAL LOW (ref >60)
GLUCOSE: 146 mg/dL — AB (ref 65–99)
POTASSIUM: 4.3 mmol/L (ref 3.5–5.1)
SODIUM: 130 mmol/L — AB (ref 135–145)
Total Protein: 5.8 g/dL — ABNORMAL LOW (ref 6.5–8.1)

## 2017-05-27 MED ORDER — POLYETHYLENE GLYCOL 3350 17 G PO PACK: 17.0000 g | PACK | Freq: Every day | ORAL | Status: DC | PRN

## 2017-05-27 MED ORDER — PROCHLORPERAZINE 25 MG RE SUPP: 12.5000 mg | Freq: Four times a day (QID) | RECTAL | Status: DC | PRN

## 2017-05-27 MED ORDER — ALUM & MAG HYDROXIDE-SIMETH 200-200-20 MG/5ML PO SUSP: 30.0000 mL | ORAL | Status: DC | PRN

## 2017-05-27 MED ORDER — TRAZODONE HCL 50 MG PO TABS: 25.0000 mg | ORAL_TABLET | Freq: Every evening | ORAL | Status: DC | PRN

## 2017-05-27 MED ORDER — ACETAMINOPHEN 325 MG PO TABS
325.0000 mg | ORAL_TABLET | ORAL | Status: DC | PRN
  Administered 2017-05-29 – 2017-06-06 (×10): 650 mg via ORAL
  Filled 2017-05-27 (×10): qty 2

## 2017-05-27 MED ORDER — GUAIFENESIN-DM 100-10 MG/5ML PO SYRP: 5.0000 mL | ORAL_SOLUTION | Freq: Four times a day (QID) | ORAL | Status: DC | PRN

## 2017-05-27 MED ORDER — DIPHENHYDRAMINE HCL 12.5 MG/5ML PO ELIX: 12.5000 mg | ORAL_SOLUTION | Freq: Four times a day (QID) | ORAL | Status: DC | PRN

## 2017-05-27 MED ORDER — PROCHLORPERAZINE EDISYLATE 5 MG/ML IJ SOLN: 5.0000 mg | Freq: Four times a day (QID) | INTRAMUSCULAR | Status: DC | PRN

## 2017-05-27 MED ORDER — FLEET ENEMA 7-19 GM/118ML RE ENEM: 1.0000 | ENEMA | Freq: Once | RECTAL | Status: DC | PRN

## 2017-05-27 MED ORDER — BISACODYL 10 MG RE SUPP
10.0000 mg | Freq: Every day | RECTAL | Status: DC | PRN
  Administered 2017-05-28: 10 mg via RECTAL
  Filled 2017-05-27: qty 1

## 2017-05-27 MED ORDER — PROCHLORPERAZINE MALEATE 5 MG PO TABS
5.0000 mg | ORAL_TABLET | Freq: Four times a day (QID) | ORAL | Status: DC | PRN
  Administered 2017-05-28: 10 mg via ORAL
  Filled 2017-05-27: qty 2

## 2017-05-27 NOTE — Progress Notes (Signed)
Social Work Assessment and Plan Social Work Assessment and Plan  Patient Details  Name: Lisa GamblerDorothy W Villaflor MRN: [id number] Date of Birth: 11/17/1927  Today's Date: 05/27/2017  Problem List:  Patient Active Problem List   Diagnosis Date Noted  . Chronic constipation   . Gastroesophageal reflux disease   . RLS (restless legs syndrome)   . DDD (degenerative disc disease), cervical   . Post-operative pain   . Chronic a-fib (HCC)   . Hyponatremia   . Cervical stenosis of spine 05/24/2017  . Cervical myelopathy (HCC) 05/24/2017  . AF (paroxysmal atrial fibrillation) (HCC) 05/24/2017  . Chronic diastolic CHF (congestive heart failure) (HCC) 05/24/2017  . CKD (chronic kidney disease) stage 3, GFR 30-59 ml/min (HCC) 05/24/2017  . Essential hypertension 05/24/2017  . Right sided weakness 05/22/2017  . Neuropathic pain 05/26/2013  . Restless leg syndrome 05/26/2013  . OA (osteoarthritis) of knee 11/07/2012   Past Medical History:  Past Medical History:  Diagnosis Date  . Arthritis   . Chronic kidney disease   . Depression   . Dysrhythmia 8/13   palpitations/ now resolved  . GERD (gastroesophageal reflux disease)   . History of blood transfusion 2012  . Hypertension   . Peripheral vascular disease (HCC)    varicose veins bilaterally   . Restless legs syndrome    Past Surgical History:  Past Surgical History:  Procedure Laterality Date  . ABDOMINAL HYSTERECTOMY    . ANTERIOR CERVICAL DECOMP/DISCECTOMY FUSION N/A 05/24/2017   Procedure: ANTERIOR CERVICAL DECOMPRESSION/DISCECTOMY FUSION CERVICAL THREE-FOUR, CERVICAL FIVE-SIX, CERVICAL SIX-SEVEN;  Surgeon: Julio SicksPool, Henry, MD;  Location: [facility] OR;  Service: Neurosurgery;  Laterality: N/A;  . APPENDECTOMY    . EYE SURGERY Bilateral    cataract extraction with IOL  . JOINT REPLACEMENT Right    knee  . SEPTOPLASTY     repair deviated septum  . TOTAL KNEE ARTHROPLASTY Left 11/07/2012   Procedure: LEFT TOTAL KNEE ARTHROPLASTY;  Surgeon:  Loanne DrillingFrank V Aluisio, MD;  Location: [facility] OR[facility];  Service: Orthopedics;  Laterality: Left;  Marland Kitchen. VARICOSE VEIN SURGERY Bilateral    Social History:  reports that  has never smoked. she has never used smokeless tobacco. She reports that she does not drink alcohol or use drugs.  Family / Support Systems Marital Status: Widow/Widower Children: Burna MortimerWanda Shelton-daughter [phone]  [name] Walker-daughter [phone]-cell Other Supports: Shanda BumpsJessica granddaughter Anticipated Caregiver: Family to come up with a plan for discharge Ability/Limitations of Caregiver: Most of them work and will need to come up with a plan Caregiver Availability: Other (Comment)(24 hr plan for short term) Family Dynamics: Close knit family who all pull together when one of them needs assist. Pt has always been one who has been independent and cared for others. She is hoping after she recovers she will become independent again. She is trying to be patient and allow herself time to heal.  Social History Preferred language: English Religion: Methodist Cultural Background: No issues Education: [employer] Read: Yes Write: Yes Employment Status: Retired Fish farm managerLegal Hisotry/Current Legal Issues: No issues Guardian/Conservator: none-according to MD pt is capable of making her own decisions while here. Her family is here daily and provide good support   Abuse/Neglect Abuse/Neglect Assessment Can Be Completed: Yes Physical Abuse: Denies Verbal Abuse: Denies Sexual Abuse: Denies Exploitation of patient/patient's resources: Denies Self-Neglect: Denies  Emotional Status Pt's affect, behavior adn adjustment status: Pt is motivated to do well and improve while here where she can do most everything for herself. She is having issues with pain  and is hoping each day this will get better. She usually is one to push through and endure to get where she wants to be. Recent Psychosocial Issues: other health issues back pain main issue Pyschiatric History:  No history deferred depression screen due to tp is coping approrpriately and talking about her concerns and feelings. She has a strong faith and relies upon this along with her family support. Substance Abuse History: No issues  Patient / Family Perceptions, Expectations & Goals Pt/Family understanding of illness & functional limitations: Pt and daughter who is a Charity fundraiserN have a good understanding of her back surgery and limitations. They both talk with the MD's and feel they have a good understanding of her treatment plan while here. Premorbid pt/family roles/activities: mom, granddaughter, retiree, church member, etc Anticipated changes in roles/activities/participation: resume Pt/family expectations/goals: Pt states: " I want to be able to do for myself before I leave here, I hope my pain is better and know I can do more than."  Daughter states: " We are hopeful she will do well here, but will help her too."  [employer] IncCommunity Resources Community Agencies: Other (Comment)(had in past) Premorbid Home Care/DME Agencies: Other (Comment)(had in past after TKR) Transportation available at discharge: Family members pt was driving one month prior to admission Resource referrals recommended: Support group (specify)  Discharge Planning Living Arrangements: Alone Support Systems: Children, Other relatives, Friends/neighbors, Psychologist, clinicalChurch/faith community Type of Residence: Private residence Insurance Resources: Media plannerrivate Insurance (specify)(UHC-Medicare) Financial Resources: Social Security Financial Screen Referred: No Living Expenses: Own Money Management: Patient Does the patient have any problems obtaining your medications?: No Home Management: Patient was up until one month ago when pain got too bad in her back Patient/Family Preliminary Plans: Return home with family providing care for a short period of time-2-3 weeks. But most of them work and have to get back to their jobs. Aware team setting goals and working  on a safe plan for home. Await therapy team evaluations. Social Work Anticipated Follow Up Needs: HH/OP  Clinical Impression Pleasant female who has always been independent and worked up until she was 83 yo. She wants to be as independent as possible and will do her best while here to reach these goals. Family is aware she may need assist and have coverage for 2-3 weeks. Will await team's evaluations and work on discharge needs.  Lucy Chrisupree, Cinda Hara G 05/27/2017, 11:52 AM

## 2017-05-27 NOTE — Evaluation (Signed)
Physical Therapy Assessment and Plan  Patient Details  Name: ASHTAN LATON MRN: [id number] Date of Birth: [birth year]  PT Diagnosis: Abnormal posture, Abnormality of gait, Low back pain and Muscle weakness Rehab Potential: Excellent ELOS: 12-16 days    Today's Date: 05/27/2017 PT Individual Time: 7209-4709 PT Individual Time Calculation (min): 60 min    Problem List:  Patient Active Problem List   Diagnosis Date Noted  . Chronic constipation   . Gastroesophageal reflux disease   . RLS (restless legs syndrome)   . DDD (degenerative disc disease), cervical   . Post-operative pain   . Chronic a-fib ([place])   . Hyponatremia   . Cervical stenosis of spine 05/24/2017  . Cervical myelopathy ([place]) 05/24/2017  . AF (paroxysmal atrial fibrillation) ([place]) 05/24/2017  . Chronic diastolic CHF (congestive heart failure) ([place]) 05/24/2017  . CKD (chronic kidney disease) stage 3, GFR 30-59 ml/min (HCC) 05/24/2017  . Essential hypertension 05/24/2017  . Right sided weakness 05/22/2017  . Neuropathic pain 05/26/2013  . Restless leg syndrome 05/26/2013  . OA (osteoarthritis) of knee 11/07/2012    Past Medical History:  Past Medical History:  Diagnosis Date  . Arthritis   . Chronic kidney disease   . Depression   . Dysrhythmia 8/13   palpitations/ now resolved  . GERD (gastroesophageal reflux disease)   . History of blood transfusion 2012  . Hypertension   . Peripheral vascular disease (HCC)    varicose veins bilaterally   . Restless legs syndrome    Past Surgical History:  Past Surgical History:  Procedure Laterality Date  . ABDOMINAL HYSTERECTOMY    . ANTERIOR CERVICAL DECOMP/DISCECTOMY FUSION N/A 05/24/2017   Procedure: ANTERIOR CERVICAL DECOMPRESSION/DISCECTOMY FUSION CERVICAL THREE-FOUR, CERVICAL FIVE-SIX, CERVICAL SIX-SEVEN;  Surgeon: Earnie Larsson, MD;  Location: [facility];  Service: Neurosurgery;  Laterality: N/A;  . APPENDECTOMY    . EYE SURGERY Bilateral    cataract extraction  with IOL  . JOINT REPLACEMENT Right    knee  . SEPTOPLASTY     repair deviated septum  . TOTAL KNEE ARTHROPLASTY Left 11/07/2012   Procedure: LEFT TOTAL KNEE ARTHROPLASTY;  Surgeon: Gearlean Alf, MD;  Location: [facility];  Service: Orthopedics;  Laterality: Left;  Marland Kitchen VARICOSE VEIN SURGERY Bilateral     Assessment & Plan Clinical Impression: Patient is a 83 y.o.femalewith history of HTN, CKD, PAF- on Xarelto and amiodarone , DDD with chronic back pain who was admitted on 05/22/17 with 2 week history of progressive right sided weakness and numbness with loss of use of right hand, right foot drop and loss of dexterity left hand.History taken from chart review and daughter.MRI brainreviewed, unremarkable for acute process. Per report,without acute infarct and MRI spine done revealing severe cervical spondylosis with diffuse canal stenosis C3/4, C5/6 and C6/7, severe cord compression C3/4, R>L reactive edema due to facet arthritis most prevalent at C5/6, multilevel compressive disc bulge throughout thoracic spine and multilevel change lumbar spine L2-L5 with severe spinal stenosis L4/5.  She was evaluated by Dr. Annette Stable who recommended cervical decompression due to myelopathy and she was cleared for surgery by Dr. Caryl Comes. She underwent cervical decompression C3- C7 with ACDF on 05/24/17.   Patient transferred to CIR on 05/26/2017 .   Patient currently requires mod with mobility secondary to muscle weakness and muscle paralysis, unbalanced muscle activation, ataxia and decreased coordination and decreased sitting balance, decreased standing balance, decreased balance strategies and difficulty maintaining precautions.  Prior to hospitalization, patient was modified independent  with mobility  and lived with Alone in a House home.  Home access is  Ramped entrance.  Patient will benefit from skilled PT intervention to maximize safe functional mobility, minimize fall risk and decrease caregiver burden for  planned discharge home with 24 hour supervision.  Anticipate patient will benefit from follow up [place] at discharge.  PT - End of Session Activity Tolerance: Tolerates 10 - 20 min activity with multiple rests Endurance Deficit: Yes PT Assessment Rehab Potential (ACUTE/IP ONLY): Excellent PT Barriers to Discharge: Home environment access/layout PT Patient demonstrates impairments in the following area(s): Balance;Edema;Endurance;Motor;Pain;Safety;Sensory;Skin Integrity PT Transfers Functional Problem(s): Bed Mobility;Bed to Chair;Car;Furniture;Floor PT Locomotion Functional Problem(s): Ambulation;Wheelchair Mobility;Stairs PT Plan PT Intensity: Minimum of 1-2 x/day ,45 to 90 minutes PT Frequency: 5 out of 7 days PT Duration Estimated Length of Stay: 12-16 days  PT Treatment/Interventions: Ambulation/gait training;Balance/vestibular training;Cognitive remediation/compensation;Discharge planning;Community reintegration;Disease management/prevention;DME/adaptive equipment instruction;Functional electrical stimulation;Functional mobility training;Neuromuscular re-education;Pain management;Patient/family education;Psychosocial support;Splinting/orthotics;Skin care/wound management;Stair training;Therapeutic Activities;Therapeutic Exercise;Visual/perceptual remediation/compensation;UE/LE Coordination activities;UE/LE Strength taining/ROM;Wheelchair propulsion/positioning PT Transfers Anticipated Outcome(s): Mod I with LRAD  PT Locomotion Anticipated Outcome(s): Supervision assist at ambulatory level  PT Recommendation Recommendations for Other Services: Therapeutic Recreation consult Therapeutic Recreation Interventions: Pet therapy;Stress management;Kitchen group Follow Up Recommendations: Home health PT Patient destination: Home Equipment Recommended: To be determined;Rolling walker with 5" wheels  Skilled Therapeutic Intervention Pt received supine in bed and agreeable to PT. Supine>sit transfer  with mod assist. PT instructed patient in PT Evaluation and initiated treatment intervention; see below for results. PT educated patient in [place], rehab potential, rehab goals, and discharge recommendations. Pt returned to room and performed stand pivot transfer to bed with mod assist. Sit>supine completed with mod assist, and left supine in bed with call bell in reach and all needs met.      PT Evaluation Precautions/Restrictions Precautions Precautions: Fall;Cervical Required Braces or Orthoses: Cervical Brace Cervical Brace: Soft collar;At all times Restrictions Weight Bearing Restrictions: No General   Vital Signs Pain Pain Assessment Pain Assessment: No/denies pain Pain Score: 0-No pain Pain Type: Acute pain Pain Location: Hip Pain Orientation: Right Pain Descriptors / Indicators: Aching Pain Frequency: Constant Pain Onset: On-going Patients Stated Pain Goal: 4 Pain Intervention(s): Medication (See eMAR) Home Living/Prior Functioning Home Living Available Help at Discharge: Family;Available 24 hours/day Type of Home: House Home Access: Ramped entrance Home Layout: One level(washer and Dryer in basement. ) Bathroom Shower/Tub: Walk-in shower;Tub/shower unit Armed forces training and education officer: Yes  Lives With: Alone Prior Function Level of Independence: Requires assistive device for independence  Able to Take Stairs?: Yes Driving: Yes Vocation: Retired Comments: Pt uses cane nad walker intermittently  Vision/Perception  Perception Perception: Within Functional Limits Praxis Praxis: Intact  Cognition Overall Cognitive Status: Within Functional Limits for tasks assessed Arousal/Alertness: Awake/alert Memory: Appears intact Awareness: Appears intact Problem Solving: Appears intact Safety/Judgment: Appears intact Sensation Sensation Light Touch: Impaired Detail Light Touch Impaired Details: Impaired RUE;Impaired RLE Proprioception: Appears  Intact Additional Comments: Pt reports intermittent parathesia in the RLE Coordination Gross Motor Movements are Fluid and Coordinated: No Fine Motor Movements are Fluid and Coordinated: No Coordination and Movement Description:  mild Ataxic movements in R LE Finger Nose Finger Test: Decreased speed and accuracy R UE; L UE WFL Heel Shin Test: decreased speed and amplitude in the RLE Motor  Motor Motor: Tetraplegia Motor - Skilled Clinical Observations: Incomplete tetraplegia C3-C7 R>L  Mobility Bed Mobility Bed Mobility: Rolling Right;Rolling Left;Supine to Sit;Sit to Supine Rolling Right: 4: Min assist Rolling Right Details: Verbal cues for  technique Rolling Left: 4: Min assist Rolling Left Details: Verbal cues for technique;Verbal cues for sequencing Supine to Sit: 4: Min assist Supine to Sit Details: Verbal cues for gait pattern;Verbal cues for precautions/safety;Verbal cues for safe use of DME/AE Sit to Supine: 4: Min assist Sit to Supine - Details: Verbal cues for technique;Verbal cues for precautions/safety;Verbal cues for safe use of DME/AE Transfers Transfers: Yes Sit to Stand: 3: Mod assist;4: Min assist Sit to Stand Details: Verbal cues for safe use of DME/AE;Verbal cues for precautions/safety;Verbal cues for technique Stand Pivot Transfers: 3: Mod assist Stand Pivot Transfer Details: Visual cues/gestures for sequencing;Verbal cues for sequencing;Verbal cues for precautions/safety;Verbal cues for technique Stand Pivot Transfer Details (indicate cue type and reason): Car transfer also completed with min-mod assist from PT with stand pivot technique  Locomotion     Trunk/Postural Assessment  Cervical Assessment Cervical Assessment: Exceptions to WFL(Soft collar; cervical pre-cautions) Thoracic Assessment Thoracic Assessment: Exceptions to WFL(Kyphotic) Lumbar Assessment Lumbar Assessment: Exceptions to WFL(Posterior pelvic tilt) Postural Control Postural Control:  Deficits on evaluation  Balance Balance Balance Assessed: Yes Dynamic Sitting Balance Dynamic Sitting - Balance Support: Feet supported;During functional activity Dynamic Sitting - Level of Assistance: 5: Stand by assistance;4: Min assist Sitting balance - Comments: Sitting EOB Dynamic Standing Balance Dynamic Standing - Balance Support: During functional activity;Right upper extremity supported;Left upper extremity supported Dynamic Standing - Level of Assistance: 4: Min assist;3: Mod assist Dynamic Standing - Comments: Standing to complete LB bathing/dressing Extremity Assessment  RUE Assessment RUE Assessment: Exceptions to WFL(Not formally assessed due to pre-cautions, however, pt able to use at slightly diminshed level) LUE Assessment LUE Assessment: Exceptions to WFL(Not formally assess due to pre-cautions, however, pt able to use in functional manner independenetly) RLE Assessment RLE Assessment: Exceptions to [facility] RLE Strength RLE Overall Strength Comments: Grossly 4/5 proximal to distal except ankle DF 3-/5 LLE Assessment LLE Assessment: Within Functional Limits   See Function Navigator for Current Functional Status.   Refer to Care Plan for Long Term Goals  Recommendations for other services: Therapeutic Recreation  Pet therapy, Kitchen group and Stress management  Discharge Criteria: Patient will be discharged from PT if patient refuses treatment 3 consecutive times without medical reason, if treatment goals not met, if there is a change in medical status, if patient makes no progress towards goals or if patient is discharged from hospital.  The above assessment, treatment plan, treatment alternatives and goals were discussed and mutually agreed upon: by patient  Lorie Phenix 05/27/2017, 3:57 PM

## 2017-05-27 NOTE — Care Management Note (Signed)
[facility]patient [facility] Individual Statement of Services  Patient Name:  [name]  Date:  05/27/2017  Welcome to the [facility].  Our goal is to provide you with an individualized program based on your diagnosis and situation, designed to meet your specific needs.  With this comprehensive rehabilitation program, you will be expected to participate in at least 3 hours of rehabilitation therapies Monday-Friday, with modified therapy programming on the weekends.  Your rehabilitation program will include the following services:  Physical Therapy (PT), Occupational Therapy (OT), 24 hour per day rehabilitation nursing, Case Management (Social Worker), Rehabilitation Medicine, Nutrition Services and Pharmacy Services  Weekly team conferences will be held on Wednesday to discuss your progress.  Your Social Worker will talk with you frequently to get your input and to update you on team discussions.  Team conferences with you and your family in attendance may also be held.  Expected length of stay: 12-16 days Overall anticipated outcome: mod/i-supervision level  Depending on your progress and recovery, your program may change. Your Social Worker will coordinate services and will keep you informed of any changes. Your Social Worker's name and contact numbers are listed  below.  The following services may also be recommended but are not provided by the Inpatient [facility]:   Driving Evaluations  Home Health Rehabiltiation Services  Outpatient Rehabilitation Services    Arrangements will be made to provide these services after discharge if needed.  Arrangements include referral to agencies that provide these services.  Your insurance has been verified to be:  [employer]Medicare Your primary doctor is:  Hamilton Caprihao Le  Pertinent information will be shared with your doctor and your insurance company.  Social Worker:  Dossie DerBecky Berenize [place], SW [phone] or ([phone])  [phone]  Information discussed with and copy given to patient by: Lucy Chrisupree, Brynn Mulgrew G, 05/27/2017, 10:21 AM

## 2017-05-27 NOTE — Evaluation (Signed)
Occupational Therapy Assessment and Plan  Patient Details  Name: [name] MRN: [id number] Date of Birth: 03/04/1928  OT Diagnosis: acute pain, ataxia, muscle weakness (generalized) and paraparesis at level C3-C7 Rehab Potential: Rehab Potential (ACUTE ONLY): Good ELOS: 2 weeks   Today's Date: 05/27/2017 OT Individual Time: 1000-1100 and 1400-1430 OT Individual Time Calculation (min): 60 min and 30 min     Problem List:  Patient Active Problem List   Diagnosis Date Noted  . Chronic constipation   . Gastroesophageal reflux disease   . RLS (restless legs syndrome)   . DDD (degenerative disc disease), cervical   . Post-operative pain   . Chronic a-fib ([place])   . Hyponatremia   . Cervical stenosis of spine 05/24/2017  . Cervical myelopathy ([place]) 05/24/2017  . AF (paroxysmal atrial fibrillation) ([place]) 05/24/2017  . Chronic diastolic CHF (congestive heart failure) ([place]) 05/24/2017  . CKD (chronic kidney disease) stage 3, GFR 30-59 ml/min (HCC) 05/24/2017  . Essential hypertension 05/24/2017  . Right sided weakness 05/22/2017  . Neuropathic pain 05/26/2013  . Restless leg syndrome 05/26/2013  . OA (osteoarthritis) of knee 11/07/2012    Past Medical History:  Past Medical History:  Diagnosis Date  . Arthritis   . Chronic kidney disease   . Depression   . Dysrhythmia 8/13   palpitations/ now resolved  . GERD (gastroesophageal reflux disease)   . History of blood transfusion 2012  . Hypertension   . Peripheral vascular disease (HCC)    varicose veins bilaterally   . Restless legs syndrome    Past Surgical History:  Past Surgical History:  Procedure Laterality Date  . ABDOMINAL HYSTERECTOMY    . ANTERIOR CERVICAL DECOMP/DISCECTOMY FUSION N/A 05/24/2017   Procedure: ANTERIOR CERVICAL DECOMPRESSION/DISCECTOMY FUSION CERVICAL THREE-FOUR, CERVICAL FIVE-SIX, CERVICAL SIX-SEVEN;  Surgeon: Earnie Larsson, MD;  Location: [facility];  Service: Neurosurgery;  Laterality: N/A;  .  APPENDECTOMY    . EYE SURGERY Bilateral    cataract extraction with IOL  . JOINT REPLACEMENT Right    knee  . SEPTOPLASTY     repair deviated septum  . TOTAL KNEE ARTHROPLASTY Left 11/07/2012   Procedure: LEFT TOTAL KNEE ARTHROPLASTY;  Surgeon: Gearlean Alf, MD;  Location: [facility];  Service: Orthopedics;  Laterality: Left;  Marland Kitchen VARICOSE VEIN SURGERY Bilateral     Assessment & Plan Clinical Impression: [name] a 83 y.o.femalewith history of HTN, CKD, PAF- on Xarelto and amiodarone , DDD with chronic back pain who was admitted on 05/22/17 with 2 week history of progressive right sided weakness and numbness with loss of use of right hand, right foot drop and loss of dexterity left hand.History taken from chart review and daughter.MRI brainreviewed, unremarkable for acute process. Per report,without acute infarct and MRI spine done revealing severe cervical spondylosis with diffuse canal stenosis C3/4, C5/6 and C6/7, severe cord compression C3/4, R>L reactive edema due to facet arthritis most prevalent at C5/6, multilevel compressive disc bulge throughout thoracic spine and multilevel change lumbar spine L2-L5 with severe spinal stenosis L4/5.   She was evaluated by Dr. Annette Stable who recommended cervical decompression due to myelopathy and she was cleared for surgery by Dr. Caryl Comes. She underwent cervical decompression C3- C7 with ACDF on 05/24/17. Therapy ongoing and patient has had decline in functional status in last 3-4 weeks affecting mobility and ability to carry out ADLs. CIR recommended by MD.   Patient transferred to CIR on 05/26/2017 .    Patient currently requires mod with basic self-care  skills secondary to muscle weakness, decreased cardiorespiratoy endurance and decreased standing balance, decreased postural control and decreased balance strategies.  Prior to hospitalization, patient could complete ADLs/IADLs with modified independent .  Patient will benefit from skilled  intervention to decrease level of assist with basic self-care skills, increase independence with basic self-care skills and increase level of independence with iADL prior to discharge home with care partner.  Anticipate patient will require intermittent supervision and follow up home health and follow up outpatient.  OT - End of Session Activity Tolerance: Tolerates 10 - 20 min activity with multiple rests Endurance Deficit: Yes Endurance Deficit Description: Required supine rest break during seated bathing/dressing task OT Assessment Rehab Potential (ACUTE ONLY): Good OT Patient demonstrates impairments in the following area(s): Balance;Pain;Perception;Cognition;Safety;Sensory;Endurance;Motor OT Basic ADL's Functional Problem(s): Eating;Grooming;Bathing;Dressing;Toileting OT Transfers Functional Problem(s): Toilet;Tub/Shower OT Additional Impairment(s): Fuctional Use of Upper Extremity OT Plan OT Intensity: Minimum of 1-2 x/day, 45 to 90 minutes OT Frequency: 5 out of 7 days OT Duration/Estimated Length of Stay: 2 weeks OT Treatment/Interventions: Balance/vestibular training;Discharge planning;Functional electrical stimulation;Pain management;Self Care/advanced ADL retraining;Therapeutic Activities;UE/LE Coordination activities;Therapeutic Exercise;Skin care/wound managment;Patient/family education;Functional mobility training;Community reintegration;DME/adaptive equipment instruction;Neuromuscular re-education;Splinting/orthotics;UE/LE Strength taining/ROM OT Self Feeding Anticipated Outcome(s): Mod I OT Basic Self-Care Anticipated Outcome(s): Supervision-mod I OT Toileting Anticipated Outcome(s): Mod I OT Bathroom Transfers Anticipated Outcome(s): Supervision-mod I OT Recommendation Recommendations for Other Services: Therapeutic Recreation consult Therapeutic Recreation Interventions: Outing/community reintergration;Stress management;Kitchen group Patient destination: Home Follow Up  Recommendations: Home health OT;Outpatient OT Equipment Recommended: To be determined   Skilled Therapeutic Intervention Session One: Pt seen for OT eval and ADL bathing/dressing session. Pt asleep in supine upon arrival, easily awoken and agreeable to tx session. Bathing/dressing completed seated EOB, stood from slightly elevated EOB with mod A in order for buttock hygiene and clothing management to be completed. Stand pivot transfer to w/c at end of session with HHA. Pt left seated in w/c at end of session, all needs in reach, and reviewed use of call bell.  Pt educated throughout session regarding role of OT, POC, OT goals, and d/c planning.   Session Two: Pt seen for OT session focusing on education on use of AE for dressing tasks. Pt asleep in sidelying upon arrival, easily awoken and agreeable to tx session. She transferred to EOB with min A. Seated EOB, education and demonstration provided regarding use of reacher for threading of pants and doffing of socks as well as use of sock aid. Pt able to return demonstrate understanding, able to thread B LEs into pants and doffed B socks. She required mod A to stand from EOB and min-mod A standing balance without use of AE in order to pull pants up.  Pt requesting to return to sidelying with k-pad applied to hip at end of session, left with all needs in reach and family members present.   OT Evaluation Precautions/Restrictions  Precautions Precautions: Fall;Cervical Required Braces or Orthoses: Cervical Brace Cervical Brace: Soft collar;At all times Restrictions Weight Bearing Restrictions: No General Chart Reviewed: Yes Pain Pain Assessment Pain Assessment: 0-10 Pain Score:No/ denies pain  Home Living/Prior Functioning Home Living Family/patient expects to be discharged to:: Private residence Living Arrangements: Alone Available Help at Discharge: Family, Available 24 hours/day Type of Home: House Home Access: Ramped entrance Home  Layout: One level(washer and Dryer in basement. ) Bathroom Shower/Tub: Walk-in shower, Chiropodist: Standard Bathroom Accessibility: Yes  Lives With: Alone IADL History Homemaking Responsibilities: Yes Current License: Yes Mode of Transportation: Car Occupation:  Retired Leisure and Hobbies: Animal nutritionist, active in church Prior Function Level of Independence: Requires assistive device for independence  Able to Take Stairs?: Yes Driving: Yes Vocation: Retired Comments: Pt uses cane and walker intermittently  Vision Baseline Vision/History: Wears glasses Wears Glasses: At all times Patient Visual Report: No change from baseline Vision Assessment?: No apparent visual deficits Perception  Perception: Within Functional Limits Praxis Praxis: Intact Cognition Overall Cognitive Status: Within Functional Limits for tasks assessed Arousal/Alertness: Awake/alert Orientation Level: Person;Place;Situation Person: Oriented Place: Oriented Situation: Oriented Year: 2018 Month: November Day of Week: Correct Memory: Appears intact Immediate Memory Recall: Sock;Blue;Bed Memory Recall: Sock;Blue;Bed Memory Recall Sock: Without Cue Memory Recall Blue: Without Cue Memory Recall Bed: With Cue Awareness: Appears intact Problem Solving: Appears intact Safety/Judgment: Appears intact Sensation Sensation Light Touch: Impaired Detail Light Touch Impaired Details: Impaired RUE;Impaired RLE Proprioception: Appears Intact Additional Comments: Pt reports intermittent parathesia in the RLE Coordination Gross Motor Movements are Fluid and Coordinated: No Fine Motor Movements are Fluid and Coordinated: No Coordination and Movement Description:  mild Ataxic movements in R LE Finger Nose Finger Test: Decreased speed and accuracy R UE; L UE WFL Heel Shin Test: decreased speed and amplitude in the RLE Motor  Motor Motor: Tetraplegia Motor - Skilled Clinical Observations:  Incomplete tetraplegia C3-C7 R>L Mobility  Bed Mobility Bed Mobility: Rolling Right;Rolling Left;Supine to Sit;Sit to Supine Rolling Right: 4: Min assist Rolling Right Details: Verbal cues for technique Rolling Left: 4: Min assist Rolling Left Details: Verbal cues for technique;Verbal cues for sequencing Supine to Sit: 4: Min assist Supine to Sit Details: Verbal cues for gait pattern;Verbal cues for precautions/safety;Verbal cues for safe use of DME/AE Sit to Supine: 4: Min assist Sit to Supine - Details: Verbal cues for technique;Verbal cues for precautions/safety;Verbal cues for safe use of DME/AE Transfers Sit to Stand: 3: Mod assist;4: Min assist Sit to Stand Details: Verbal cues for safe use of DME/AE;Verbal cues for precautions/safety;Verbal cues for technique  Trunk/Postural Assessment  Cervical Assessment Cervical Assessment: Exceptions to WFL(Soft collar; cervical pre-cautions) Thoracic Assessment Thoracic Assessment: Exceptions to WFL(Kyphotic) Lumbar Assessment Lumbar Assessment: Exceptions to WFL(Posterior pelvic tilt) Postural Control Postural Control: Deficits on evaluation  Balance Balance Balance Assessed: Yes Dynamic Sitting Balance Dynamic Sitting - Balance Support: Feet supported;During functional activity Dynamic Sitting - Level of Assistance: 5: Stand by assistance;4: Min assist Sitting balance - Comments: Sitting EOB Dynamic Standing Balance Dynamic Standing - Balance Support: During functional activity;Right upper extremity supported;Left upper extremity supported Dynamic Standing - Level of Assistance: 4: Min assist;3: Mod assist Dynamic Standing - Comments: Standing to complete LB bathing/dressing Extremity/Trunk Assessment RUE Assessment RUE Assessment: Exceptions to WFL(Not formally assessed due to pre-cautions, however, pt able to use at slightly diminshed level) LUE Assessment LUE Assessment: Exceptions to WFL(Not formally assessed due to  pre-cautions, however, pt able to use in functional manner independenetly)   See Function Navigator for Current Functional Status.   Refer to Care Plan for Long Term Goals  Recommendations for other services: Therapeutic Recreation  Kitchen group, Stress management and Outing/community reintegration   Discharge Criteria: Patient will be discharged from OT if patient refuses treatment 3 consecutive times without medical reason, if treatment goals not met, if there is a change in medical status, if patient makes no progress towards goals or if patient is discharged from hospital.  The above assessment, treatment plan, treatment alternatives and goals were discussed and mutually agreed upon: by patient  Ernestina Patches 05/27/2017, 4:24 PM

## 2017-05-27 NOTE — Progress Notes (Signed)
Bilateral lower extremity venous duplex has been completed. Negative for DVT.  05/27/17 1:56 PM Olen CordialGreg Mancil Pfenning RVT

## 2017-05-27 NOTE — Progress Notes (Signed)
Subjective/Complaints: Some increased neck pain last noc, no pain this am coughing up sputum ROS- neg CP, SOB, N/V/D  Objective: Vital Signs: Blood pressure (!) 143/50, pulse 75, temperature 98.4 F (36.9 C), temperature source Oral, resp. rate 18, height '5\' 7"'$  (1.702 m), weight 81.1 kg (178 lb 12.8 oz), SpO2 97 %. No results found. Results for orders placed or performed during the hospital encounter of 05/21/17 (from the past 72 hour(s))  CBC     Status: None   Collection Time: 05/25/17  5:52 AM  Result Value Ref Range   WBC 7.9 4.0 - 10.5 K/uL   RBC 4.18 3.87 - 5.11 MIL/uL   Hemoglobin 13.1 12.0 - 15.0 g/dL   HCT 38.4 36.0 - 46.0 %   MCV 91.9 78.0 - 100.0 fL   MCH 31.3 26.0 - 34.0 pg   MCHC 34.1 30.0 - 36.0 g/dL   RDW 15.2 11.5 - 15.5 %   Platelets 197 150 - 400 K/uL  Basic metabolic panel     Status: Abnormal   Collection Time: 05/25/17  5:52 AM  Result Value Ref Range   Sodium 131 (L) 135 - 145 mmol/L   Potassium 4.3 3.5 - 5.1 mmol/L   Chloride 99 (L) 101 - 111 mmol/L   CO2 23 22 - 32 mmol/L   Glucose, Bld 153 (H) 65 - 99 mg/dL   BUN 20 6 - 20 mg/dL   Creatinine, Ser 1.08 (H) 0.44 - 1.00 mg/dL   Calcium 8.9 8.9 - 10.3 mg/dL   GFR calc non Af Amer 45 (L) >60 mL/min   GFR calc Af Amer 52 (L) >60 mL/min    Comment: (NOTE) The eGFR has been calculated using the CKD EPI equation. This calculation has not been validated in all clinical situations. eGFR's persistently <60 mL/min signify possible Chronic Kidney Disease.    Anion gap 9 5 - 15  Basic metabolic panel     Status: Abnormal   Collection Time: 05/26/17  4:13 AM  Result Value Ref Range   Sodium 133 (L) 135 - 145 mmol/L   Potassium 4.7 3.5 - 5.1 mmol/L   Chloride 100 (L) 101 - 111 mmol/L   CO2 27 22 - 32 mmol/L   Glucose, Bld 107 (H) 65 - 99 mg/dL   BUN 32 (H) 6 - 20 mg/dL   Creatinine, Ser 1.22 (H) 0.44 - 1.00 mg/dL   Calcium 8.6 (L) 8.9 - 10.3 mg/dL   GFR calc non Af Amer 39 (L) >60 mL/min   GFR calc Af  Amer 45 (L) >60 mL/min    Comment: (NOTE) The eGFR has been calculated using the CKD EPI equation. This calculation has not been validated in all clinical situations. eGFR's persistently <60 mL/min signify possible Chronic Kidney Disease.    Anion gap 6 5 - 15  CBC     Status: Abnormal   Collection Time: 05/26/17  4:13 AM  Result Value Ref Range   WBC 8.7 4.0 - 10.5 K/uL   RBC 3.85 (L) 3.87 - 5.11 MIL/uL   Hemoglobin 11.8 (L) 12.0 - 15.0 g/dL   HCT 35.5 (L) 36.0 - 46.0 %   MCV 92.2 78.0 - 100.0 fL   MCH 30.6 26.0 - 34.0 pg   MCHC 33.2 30.0 - 36.0 g/dL   RDW 15.4 11.5 - 15.5 %   Platelets 161 150 - 400 K/uL     HEENT: neck orthosis, ant incision with eccymosis no drainage Cardio: RRR and no murmur Resp:  CTA B/L and unlabored GI: BS positive and NT, ND Extremity:  No Edema Skin:   Wound C/D/I and IV site clean , no erythema Neuro: Alert/Oriented, Normal Sensory, Abnormal Motor 4- Right delt, bi, tri, grip, HF, KE,ADF, 5/5 on left side and Abnormal FMC Ataxic/ dec FMC Musc/Skel:  Other no pain with UE or LE ROM Gen NAD   Assessment/Plan: 1. Functional deficits secondary to cervical myelopathy  which require 3+ hours per day of interdisciplinary therapy in a comprehensive inpatient rehab setting. Physiatrist is providing close team supervision and 24 hour management of active medical problems listed below. Physiatrist and rehab team continue to assess barriers to discharge/monitor patient progress toward functional and medical goals. FIM: Function - Bathing Bathing activity did not occur: N/A  Function- Upper Body Dressing/Undressing Upper body dressing/undressing activity did not occur: N/A Function - Lower Body Dressing/Undressing Lower body dressing/undressing activity did not occur: N/A  Function - Toileting Toileting activity did not occur: N/A  Function - Air cabin crew transfer activity did not occur: N/A  Function - Chair/bed transfer Chair/bed  transfer activity did not occur: N/A     Function - Comprehension Comprehension: Auditory Comprehension assist level: Follows complex conversation/direction with extra time/assistive device  Function - Expression Expression: Verbal Expression assist level: Expresses complex ideas: With extra time/assistive device  Function - Social Interaction Social Interaction assist level: Interacts appropriately with others - No medications needed.  Function - Problem Solving Problem solving assist level: Solves complex 90% of the time/cues < 10% of the time  Function - Memory Memory assist level: Recognizes or recalls 75 - 89% of the time/requires cueing 10 - 24% of the time Patient normally able to recall (first 3 days only): Current season, That he or she is in a hospital  Medical Problem List and Plan: 1.  Gait abnormality and limitations in self-care secondary to cervical myelopathy. Rehab evals PT, OT today 2.  DVT Prophylaxis/Anticoagulation: Mechanical: Sequential compression devices, below knee Bilateral lower extremities, no pharmacologic due to cervical surgery 3. Pain Management: Will continue oxycodone prn. Add voltaren gel additionally with ice/heat for local measures.  4. Mood: LCSW to follow for evaluation and support.  5. Neuropsych: This patient is capable of making decisions on her own behalf. 6. Skin/Wound Care: monitor wound for healing. Add nutritional supplement to promote healing 7. Fluids/Electrolytes/Nutrition: Monitor I/O. Check lytes in am.  8. A fib: Monitor heart rate bid--on amiodarone at bedtime with toprol XL bid. Xarelto on hold, HR controlled 11/15 Vitals:   05/27/17 0324 05/27/17 0911  BP: (!) 143/50 (!) 141/57  Pulse: 75 78  Resp: 18   Temp: 98.4 F (36.9 C)   SpO2: 97%    9. HTN: Monitor BP bid. Continue norvasc and metoprolol, adequately controlled 11/15 10 CKD: Encourage fluid intake. Monitor renal status 11. RLS: Managed with requip at nights and  xanax prn 12. GERD: Continue protonix. 13. Seasonal allergies: On claritin and flonase--uses albuterol prn for wheezing.  14.  Chronic LBP with radiculopathy: Unable to tolerated gabapentin or tramadol. Will continue to use local measures .Oxy IR more helpful than tramadol but causing some drowsiness cont to monitor 14. Chronic constipation: Uses suppository daily. Will add miralax due to reports of hard stools and now on narcotics.  15. Chronic mild hyponatremia: Encourage intake.  16.  Lungs clear cont post op pulm toilet   LOS (Days) 1 A FACE TO FACE EVALUATION WAS PERFORMED  KIRSTEINS,ANDREW E 05/27/2017, 8:59 AM

## 2017-05-27 NOTE — Progress Notes (Signed)
Occupational Therapy Session Note  Patient Details  Name: [name] MRN: [id number] Date of Birth: 05/05/1928  Today's Date: 05/27/2017 OT Individual Time: 1125-1205 OT Individual Time Calculation (min): 40 min    Short Term Goals: Week 1:     Skilled Therapeutic Interventions/Progress Updates:    1:1 focus on toilet transfers and toileting in sit to stand and functional ambulation with RW. Pt able to perform stand pivot transfer with mod A without AD. Pt required max A for toileting sit to stand. Pt able to ambulate out of bathroom with min A with RW with extra time to compensate for foot drop on right. Left resting in recliner with heat pack.   Therapy Documentation Precautions:  Restrictions Weight Bearing Restrictions: No Pain:  c.o discomfort in right hip; applied heat and reported to RN See Function Navigator for Current Functional Status.   Therapy/Group: Individual Therapy  Roney MansSmith, Deshaun Schou [facility] 05/27/2017, 12:01 PM

## 2017-05-27 NOTE — Progress Notes (Signed)
Patient information reviewed and entered into eRehab system by Rodel Glaspy, RN, CRRN, PPS Coordinator.  Information including medical coding and functional independence measure will be reviewed and updated through discharge.     Per nursing patient was given "Data Collection Information Summary for Patients in Inpatient Rehabilitation Facilities with attached "Privacy Act Statement-Health Care Records" upon admission.  

## 2017-05-27 NOTE — Progress Notes (Signed)
Pt resting in bed quietly. Easily aroused. C/o right hip pain and rates 5(10) on numerical scale. Oral and topical pain management administered and effective. Repositioned for comfort. Skin intact except for surgical incision at ventral neck that is well approximated and dry with # 9 steri strips noted with old dried blood. Able to make needs known. Continent of B/B. Safety maintained. Will continue to monitor.

## 2017-05-27 NOTE — Plan of Care (Signed)
Enema refused by pt today. Goal is for pt to have BM every three days before intervention.

## 2017-05-28 ENCOUNTER — Inpatient Hospital Stay (HOSPITAL_COMMUNITY): Payer: Medicare Other | Admitting: Physical Therapy

## 2017-05-28 ENCOUNTER — Inpatient Hospital Stay (HOSPITAL_COMMUNITY): Payer: Medicare Other | Admitting: Occupational Therapy

## 2017-05-28 DIAGNOSIS — R269 Unspecified abnormalities of gait and mobility: Secondary | ICD-10-CM

## 2017-05-28 DIAGNOSIS — R1313 Dysphagia, pharyngeal phase: Secondary | ICD-10-CM

## 2017-05-28 DIAGNOSIS — G959 Disease of spinal cord, unspecified: Secondary | ICD-10-CM

## 2017-05-28 NOTE — IPOC Note (Signed)
Overall Plan of Care [facility](IPOC) Patient Details Name: Lisa GamblerDorothy W Solomon MRN: [id number] DOB: 04/28/1928  Admitting Diagnosis: <principal problem not specified>  Hospital Problems: Active Problems:   Cervical myelopathy (HCC)     Functional Problem List: Nursing Endurance, Medication Management, Nutrition, Pain, Safety, Skin Integrity, Bowel  PT Balance, Edema, Endurance, Motor, Pain, Safety, Sensory, Skin Integrity  OT Balance, Pain, Perception, Cognition, Safety, Sensory, Endurance, Motor  SLP Nutrition  TR         Basic ADL's: OT Eating, Grooming, Bathing, Dressing, Toileting     Advanced  ADL's: OT       Transfers: PT Bed Mobility, Bed to Chair, Car, Furniture, Civil Service fast [place]  OT Toilet, Research scientist (life sciences)Tub/Shower     Locomotion: PT Ambulation, Psychologist, prison and probation servicesWheelchair Mobility, Stairs     Additional Impairments: OT Fuctional Use of Upper Extremity  SLP Swallowing      TR      Anticipated Outcomes Item Anticipated Outcome  Self Feeding Mod I  Swallowing  Mod I   Basic self-care  Supervision-mod I  Toileting  Mod I   Bathroom Transfers Supervision-mod I  Bowel/Bladder  Mod assist with bowel medications  Transfers  Mod I with LRAD   Locomotion  Supervision assist at ambulatory level   Communication     Cognition     Pain  < 4 on a 0-10 pain scale  Safety/Judgment  min assist for safety    Therapy Plan: PT Intensity: Minimum of 1-2 x/day ,45 to 90 minutes PT Frequency: 5 out of 7 days PT Duration Estimated Length of Stay: 12-16 days  OT Intensity: Minimum of 1-2 x/day, 45 to 90 minutes OT Frequency: 5 out of 7 days OT Duration/Estimated Length of Stay: 2 weeks SLP Intensity: Minumum of 1-2 x/day, 30 to 90 minutes SLP Frequency: 3 to 5 out of 7 days SLP Duration/Estimated Length of Stay: 2 weeks    Team Interventions: Nursing Interventions Bowel Management, Pain Management, Skin Care/Wound Management, Psychosocial Support, Medication Management, Discharge Planning, Patient/Family  Education  PT interventions Ambulation/gait training, Warden/rangerBalance/vestibular training, Cognitive remediation/compensation, Discharge planning, Community reintegration, Disease management/prevention, DME/adaptive equipment instruction, Functional electrical stimulation, Functional mobility training, Neuromuscular re-education, Pain management, Patient/family education, Psychosocial support, Splinting/orthotics, Skin care/wound management, Stair training, Therapeutic Activities, Therapeutic Exercise, Visual/perceptual remediation/compensation, UE/LE Coordination activities, UE/LE Strength taining/ROM, Wheelchair propulsion/positioning  OT Interventions Warden/rangerBalance/vestibular training, Discharge planning, Functional electrical stimulation, Pain management, Self Care/advanced ADL retraining, Therapeutic Activities, UE/LE Coordination activities, Therapeutic Exercise, Skin care/wound managment, Patient/family education, Functional mobility training, Community reintegration, Fish farm managerDME/adaptive equipment instruction, Neuromuscular re-education, Splinting/orthotics, UE/LE Strength taining/ROM  SLP Interventions Patient/family education, Dysphagia/aspiration precaution training  TR Interventions    SW/CM Interventions Discharge Planning, Psychosocial Support, Patient/Family Education   Barriers to Discharge MD  Medical stability and Wound care  Nursing      PT Home environment access/layout    OT      SLP      SW       Team Discharge Planning: Destination: PT-Home ,OT- Home , SLP-Home Projected Follow-up: PT-Home health PT, OT-  Home health OT, Outpatient OT, SLP-None Projected Equipment Needs: PT-To be determined, Rolling walker with 5" wheels, OT- To be determined, SLP-To be determined Equipment Details: PT- , OT-  Patient/family involved in discharge planning: PT- Patient, Family member/caregiver,  OT-Patient, SLP-Patient  MD ELOS: 10-14d Medical Rehab Prognosis:  Good Assessment:  83 y.o.femalewith  history of HTN, CKD, PAF- on Xarelto and amiodarone , DDD with chronic back pain who was admitted on 05/22/17 with 2 week  history of progressive right sided weakness and numbness with loss of use of right hand, right foot drop and loss of dexterity left hand.History taken from chart review and daughter.MRI brainreviewed, unremarkable for acute process. Per report,without acute infarct and MRI spine done revealing severe cervical spondylosis with diffuse canal stenosis C3/4, C5/6 and C6/7, severe cord compression C3/4, R>L reactive edema due to facet arthritis most prevalent at C5/6, multilevel compressive disc bulge throughout thoracic spine and multilevel change lumbar spine L2-L5 with severe spinal stenosis L4/5.   She was evaluated by Dr. Jordan LikesPool who recommended cervical decompression due to myelopathy and she was cleared for surgery by Dr. Graciela HusbandsKlein. She underwent cervical decompression C3- C7 with ACDF on 05/24/17   Now requiring 24/7 Rehab RN,MD, as well as CIR level PT, OT .  Treatment team will focus on ADLs and mobility with goals set at Mod I   See Team Conference Notes for weekly updates to the plan of care

## 2017-05-28 NOTE — Progress Notes (Signed)
Physical Therapy Session Note  Patient Details  Name: [name] MRN: [id number] Date of Birth: 1927/11/08  Today's Date: 05/28/2017 PT Individual Time: 5621-3086 AND 1110-1205 PT Individual Time Calculation (min): 30 min AND 30mn   Short Term Goals: Week 1:  PT Short Term Goal 1 (Week 1): Pt will perform sit<>stand with min assist and LRAD  PT Short Term Goal 2 (Week 1): Pt will perform bed mobility with min assist  PT Short Term Goal 3 (Week 1): Pt will ambulate 1039fwith min assist and LRAD  PT Short Term Goal 4 (Week 1): Pt will perform stand pivot transfer to and from [facility] min assist consistant with LRAD  Skilled Therapeutic Interventions/Progress Updates:   Pt received sitting in WC and agreeable to PT  Pt reports mild nausea at start of treatment session. Sit<.>stand x 5 with min assist from PT and min cues for proper UE placement and imrpoved anterior weight shift.   Gait training x 3089f 2 with min assist from PT. PT placed DF wrap on R ankle on second bout of gati training. Significant improvement in step length and knee stability on the R following DF wrap.   Patient returned to room and left [facility] in [facility] reach and all needs met.    Session 2.  Pt received semirecumbent in bed and agreeable to PT. Pt assisted to come to sitting EOB with supervision assist and heavy use of bed rails.   Stand pivot transfer from elevated height with min assist and RW. Min cues for anterior weight shift.   PT placed RLE in DF with ace warp prior to all standing mobility. Gait training instructed by PT with RW x 50f56f level surface with min-supervision assist. Additional gait training performed  on unlevel surface. X 10 ft with min assist and RW. Min cues for Ad management and gait pattern.   Stair negotiation on 3' steps x 8. Min assist from PT with min-mod cues for step to gait pattern. RLE knee genu recurvatum in stance on steps.   Kinetron reciprocal  movement training x 4 x 45 seconds on 30cm/sec cues for disassociation of RLE and LLE.   Patient returned to room and left sitting [facility] in reach and all needs met.           Therapy Documentation Precautions:  Precautions Precautions: Fall, Cervical Required Braces or Orthoses: Cervical Brace Cervical Brace: Soft collar, At all times Restrictions Weight Bearing Restrictions: No Pain: 0/10   See Function Navigator for Current Functional Status.   Therapy/Group: Individual Therapy  AustLorie Phenix16/2018, 9:36 AM

## 2017-05-28 NOTE — Progress Notes (Signed)
Occupational Therapy Session Note  Patient Details  Name: [name] MRN: [id number] Date of Birth: 02/24/1928  Today's Date: 05/28/2017 OT Individual Time: 0800-0900 and 1300-1355 OT Individual Time Calculation (min): 60 min and 55 min   Short Term Goals: Week 1:  OT Short Term Goal 1 (Week 1): Pt will complete 3/3 toileting tasks with steadying assist OT Short Term Goal 2 (Week 1): Pt will complete grooming task standing at sink with CGA in order to increase functional standing balance/endurance OT Short Term Goal 3 (Week 1): Pt will correctly use AE PRN during dressing tasks independently  Skilled Therapeutic Interventions/Progress Updates:    Session one: Pt seen for OT session focusing on ADL re-training. Pt asleep in supine upon arrival, easily awoken and agreeable to tx session. She transferred to EOB with min A using bed functions. Upon sitting EOB, pt with complaints of nausea and began heaving up salvia. RN made aware and medication administered as well as provided with ginger ale for comfort. Following rest break seated EOB, pt agreeable to cont with therapy. She desired to complete showering task this afternoon, opting to just change clothes seated EOB. She was unable to recall technique for use of AE to assist with LB dressing, and re-demonstration provided for use of reacher to thread pants and pt return demonstrated. She stood from EOB with min A to RW and with steadying assist able to assist in pulling pants up, requiring assist to fully advance over hips.  She ambulated with min A and assist for RW management into bathroom for toileting task, using grab bars to steady self and assist with controlled sit <> stand. Grooming tasks completed standing at sink with CGA. Pt returned to w/c and set-up assist provided for meal tray. Provided pt wit hred built up gripper to assist with self feeding use R UE. Pt left seated in w/c at end of session eating breakfast, all needs in reach and  granddaughter present.    Session Two: Pt seen for OT ADL bathing/dressing session. Pt sitting up in recliner upon arrival, finsihing lunch and agreeable to tx session. MD cleared pt for shower this morning, able to remove soft collar once seated in shower and no need to cover surgical site. She ambulated with min A using RW into bathroom and completed toileting task with steadying assist. Pt unwilling to attempt to let go of RW with B hads in order to complete clothing management due to fear of falling. She bathed seated on 3-1 BSC, assisted required for buttock hygiene and reaching B feet, pt will benefit from [facility] sponge, will issue during next B/D session. She returend to w/c to dress, assist required to pull shirt  Around back of button up shirt, pt able to assist with buttoning bottom two buttons, however, unable to fasten once vision occluded by cervical brace. She ambulated back to recliner at end of session, left seated in recliner with all needs in reach and hand off to SLP.   Therapy Documentation Precautions:  Precautions Precautions: Fall, Cervical Required Braces or Orthoses: Cervical Brace Cervical Brace: Soft collar, At all times Restrictions Weight Bearing Restrictions: (P) No Pain: Pain Assessment Pain Assessment: 0-10 Pain Score: No/ denies pain  See Function Navigator for Current Functional Status.   Therapy/Group: Individual Therapy  Lewis, Marella Vanderpol C 05/28/2017, 6:39 AM

## 2017-05-28 NOTE — Progress Notes (Signed)
Pt c/o trouble swallowing thin liquids; coughing after drinking fluids; stated that she feels like she has something in her throat that she cannot cough up; sputum thin and clear; admin Mucinex and cont. to monitor  Harlow AsaSochima O Cj Edgell, RN

## 2017-05-28 NOTE — Progress Notes (Signed)
Subjective/Complaints: Patient complains of swallowing problems during breakfast.  No coughing or choking on meals no problem with liquids. Pain is fairly well controlled. Discussed showering with therapy has been greater than 4 days since surgery ROS- neg CP, SOB, N/V/D  Objective: Vital Signs: Blood pressure (!) 174/82, pulse 81, temperature 98.6 F (37 C), temperature source Axillary, resp. rate 20, height _0  (1.702 m), weight 81.1 kg (178 lb 12.8 oz), SpO2 94 %. No results found. Results for orders placed or performed during the hospital encounter of 05/26/17 (from the past 72 hour(s))  Comprehensive metabolic panel     Status: Abnormal   Collection Time: 05/27/17  9:27 AM  Result Value Ref Range   Sodium 130 (L) 135 - 145 mmol/L   Potassium 4.3 3.5 - 5.1 mmol/L   Chloride 97 (L) 101 - 111 mmol/L   CO2 26 22 - 32 mmol/L   Glucose, Bld 146 (H) 65 - 99 mg/dL   BUN 30 (H) 6 - 20 mg/dL   Creatinine, Ser 1.08 (H) 0.44 - 1.00 mg/dL   Calcium 8.7 (L) 8.9 - 10.3 mg/dL   Total Protein 5.8 (L) 6.5 - 8.1 g/dL   Albumin 2.9 (L) 3.5 - 5.0 g/dL   AST 36 15 - 41 U/L   ALT 20 14 - 54 U/L   Alkaline Phosphatase 53 38 - 126 U/L   Total Bilirubin 0.8 0.3 - 1.2 mg/dL   GFR calc non Af Amer 45 (L) >60 mL/min   GFR calc Af Amer 52 (L) >60 mL/min    Comment: (NOTE) The eGFR has been calculated using the CKD EPI equation. This calculation has not been validated in all clinical situations. eGFR's persistently <60 mL/min signify possible Chronic Kidney Disease.    Anion gap 7 5 - 15  CBC WITH DIFFERENTIAL     Status: Abnormal   Collection Time: 05/27/17  9:27 AM  Result Value Ref Range   WBC 7.6 4.0 - 10.5 K/uL   RBC 3.82 (L) 3.87 - 5.11 MIL/uL   Hemoglobin 11.7 (L) 12.0 - 15.0 g/dL   HCT 35.4 (L) 36.0 - 46.0 %   MCV 92.7 78.0 - 100.0 fL   MCH 30.6 26.0 - 34.0 pg   MCHC 33.1 30.0 - 36.0 g/dL   RDW 15.1 11.5 - 15.5 %   Platelets 168 150 - 400 K/uL   Neutrophils Relative % 65 %   Neutro  Abs 5.0 1.7 - 7.7 K/uL   Lymphocytes Relative 11 %   Lymphs Abs 0.8 0.7 - 4.0 K/uL   Monocytes Relative 22 %   Monocytes Absolute 1.7 (H) 0.1 - 1.0 K/uL   Eosinophils Relative 2 %   Eosinophils Absolute 0.2 0.0 - 0.7 K/uL   Basophils Relative 0 %   Basophils Absolute 0.0 0.0 - 0.1 K/uL     HEENT: neck orthosis, ant incision with ecchymosis no drainage Cardio: RRR and no murmur Resp: CTA B/L and unlabored GI: BS positive and NT, ND Extremity:  No Edema Skin:   Wound C/D/I and IV site clean , no erythema Neuro: Alert/Oriented, Normal Sensory, Abnormal Motor 4- Right delt, bi, tri, grip, HF, KE,ADF, 5/5 on left side and Abnormal FMC Ataxic/ dec FMC Musc/Skel:  Other no pain with UE or LE ROM Gen NAD   Assessment/Plan: 1. Functional deficits secondary to cervical myelopathy  which require 3+ hours per day of interdisciplinary therapy in a comprehensive inpatient rehab setting. Physiatrist is providing close team supervision and 24  hour management of active medical problems listed below. Physiatrist and rehab team continue to assess barriers to discharge/monitor patient progress toward functional and medical goals. FIM: Function - Bathing Bathing activity did not occur: N/A Position: Sitting EOB Body parts bathed by patient: Right arm, Chest, Abdomen, Front perineal area, Right upper leg, Left upper leg Body parts bathed by helper: Buttocks, Right lower leg, Left lower leg, Back  Function- Upper Body Dressing/Undressing Upper body dressing/undressing activity did not occur: N/A What is the patient wearing?: Pull over shirt/dress Pull over shirt/dress - Perfomed by patient: Thread/unthread right sleeve, Thread/unthread left sleeve Pull over shirt/dress - Perfomed by helper: Put head through opening, Pull shirt over trunk Function - Lower Body Dressing/Undressing Lower body dressing/undressing activity did not occur: N/A What is the patient wearing?: Pants, Non-skid slipper  socks Position: Sitting EOB Pants- Performed by helper: Thread/unthread right pants leg, Thread/unthread left pants leg, Pull pants up/down Non-skid slipper socks- Performed by helper: Don/doff right sock, Don/doff left sock Assist for footwear: Maximal assist Assist for lower body dressing: Touching or steadying assistance (Pt > 75%)  Function - Toileting Toileting activity did not occur: N/A Toileting steps completed by patient: Performs perineal hygiene Toileting steps completed by helper: Adjust clothing prior to toileting, Adjust clothing after toileting Toileting Assistive Devices: Toilet aid Assist level: Touching or steadying assistance (Pt.75%)  Function - Air cabin crew transfer activity did not occur: N/A Assist level to toilet: Moderate assist (Pt 50 - 74%/lift or lower) Assist level from toilet: Touching or steadying assistance (Pt > 75%)  Function - Chair/bed transfer Chair/bed transfer activity did not occur: N/A Chair/bed transfer method: Stand pivot Chair/bed transfer assist level: Moderate assist (Pt 50 - 74%/lift or lower) Chair/bed transfer assistive device: Armrests, Walker  Function - Locomotion: Wheelchair Type: Manual Max wheelchair distance: 59f  Assist Level: Moderate assistance (Pt 50 - 74%) Assist Level: Moderate assistance (Pt 50 - 74%) Wheel 150 feet activity did not occur: Safety/medical concerns Function - Locomotion: Ambulation Assistive device: Walker-rolling Max distance: 571f Assist level: Moderate assist (Pt 50 - 74%) Assist level: Touching or steadying assistance (Pt > 75%) Assist level: Moderate assist (Pt 50 - 74%) Walk 150 feet activity did not occur: Safety/medical concerns Walk 10 feet on uneven surfaces activity did not occur: Safety/medical concerns  Function - Comprehension Comprehension: Auditory Comprehension assist level: Follows complex conversation/direction with extra time/assistive device  Function -  Expression Expression: Verbal Expression assist level: Expresses complex ideas: With extra time/assistive device  Function - Social Interaction Social Interaction assist level: Interacts appropriately with others - No medications needed.  Function - Problem Solving Problem solving assist level: Solves complex 90% of the time/cues < 10% of the time  Function - Memory Memory assist level: Recognizes or recalls 75 - 89% of the time/requires cueing 10 - 24% of the time Patient normally able to recall (first 3 days only): Current season, That he or she is in a hospital  Medical Problem List and Plan: 1.  Gait abnormality and limitations in self-care secondary to cervical myelopathy. CIR PT, OT  2.  DVT Prophylaxis/Anticoagulation: Mechanical: Sequential compression devices, below knee Bilateral lower extremities, no pharmacologic due to cervical surgery 3. Pain Management: Will continue oxycodone prn. Add voltaren gel additionally with ice/heat for local measures.  4. Mood: LCSW to follow for evaluation and support.  5. Neuropsych: This patient is capable of making decisions on her own behalf. 6. Skin/Wound Care: monitor wound for healing. Add nutritional supplement to  promote healing 7. Fluids/Electrolytes/Nutrition: Monitor I/O. Check lytes in am.  8. A fib: Monitor heart rate bid--on amiodarone at bedtime with toprol XL bid. Xarelto on hold, HR controlled 11/15 Vitals:   05/27/17 2107 05/28/17 0250  BP: 132/67 (!) 174/82  Pulse: 86 81  Resp:  20  Temp:  98.6 F (37 C)  SpO2:  94%   9. HTN: Monitor BP bid. Continue norvasc and metoprolol, adequately controlled 11/15 10 CKD: Encourage fluid intake. Monitor renal status, Elevated BUN likely due to intake 745m yesterday 11. RLS: Managed with requip at nights and xanax prn 12. GERD: Continue protonix. 13. Seasonal allergies: On claritin and flonase--uses albuterol prn for wheezing.  14.  Chronic LBP with radiculopathy: Unable to  tolerated gabapentin or tramadol. Will continue to use local measures .Oxy IR more helpful than tramadol but causing some drowsiness cont to monitor 14. Chronic constipation: Uses suppository daily. Will add miralax due to reports of hard stools and now on narcotics.  15. Chronic mild hyponatremia: Encourage intake.   16.  Dysphagia due to swelling at operative site should gradually improve  LOS (Days) 2 A FACE TO FACE EVALUATION WAS PERFORMED  ACharlett Blake11/16/2018, 11:32 AM

## 2017-05-28 NOTE — Progress Notes (Signed)
Patient reports that sore throat is better but she continues to have difficulty with swallowing despite limiting herself to soft foods. She reports feeling chocked with liquids. Will consult ST for bedside evaluation

## 2017-05-28 NOTE — Evaluation (Signed)
Speech Language Pathology Assessment and Plan  Patient Details  Name: [name] MRN: [id number] Date of Birth: 1928/04/22  SLP Diagnosis: Dysphagia  Rehab Potential:   ELOS: 2 weeks    Today's Date: 05/28/2017 SLP Individual Time: 1400-1430 SLP Individual Time Calculation (min): 30 min   Problem List:  Patient Active Problem List   Diagnosis Date Noted  . Chronic constipation   . Gastroesophageal reflux disease   . RLS (restless legs syndrome)   . DDD (degenerative disc disease), cervical   . Post-operative pain   . Chronic a-fib ([place])   . Hyponatremia   . Cervical stenosis of spine 05/24/2017  . Cervical myelopathy ([place]) 05/24/2017  . AF (paroxysmal atrial fibrillation) ([place]) 05/24/2017  . Chronic diastolic CHF (congestive heart failure) ([place]) 05/24/2017  . CKD (chronic kidney disease) stage 3, GFR 30-59 ml/min (HCC) 05/24/2017  . Essential hypertension 05/24/2017  . Right sided weakness 05/22/2017  . Neuropathic pain 05/26/2013  . Restless leg syndrome 05/26/2013  . OA (osteoarthritis) of knee 11/07/2012   Past Medical History:  Past Medical History:  Diagnosis Date  . Arthritis   . Chronic kidney disease   . Depression   . Dysrhythmia 8/13   palpitations/ now resolved  . GERD (gastroesophageal reflux disease)   . History of blood transfusion 2012  . Hypertension   . Peripheral vascular disease (HCC)    varicose veins bilaterally   . Restless legs syndrome    Past Surgical History:  Past Surgical History:  Procedure Laterality Date  . ABDOMINAL HYSTERECTOMY    . ANTERIOR CERVICAL DECOMPRESSION/DISCECTOMY FUSION CERVICAL THREE-FOUR, CERVICAL FIVE-SIX, CERVICAL SIX-SEVEN N/A 05/24/2017   Performed by Earnie Larsson, MD at [place]    . EYE SURGERY Bilateral    cataract extraction with IOL  . JOINT REPLACEMENT Right    knee  . LEFT TOTAL KNEE ARTHROPLASTY Left 11/07/2012   Performed by Gearlean Alf, MD at [facility] ORS  . SEPTOPLASTY      repair deviated septum  . VARICOSE VEIN SURGERY Bilateral     Assessment / Plan / Recommendation Clinical Impression [name] a 83 y.o.femalewith history of HTN, CKD, PAF- on Xarelto and amiodarone , DDD with chronic back pain who was admitted on 05/22/17 with 2 week history of progressive right sided weakness and numbness with loss of use of right hand, right foot drop and loss of dexterity left hand.History taken from chart review and daughter.MRI brainreviewed, unremarkable for acute process. Per report,without acute infarct and MRI spine done revealing severe cervical spondylosis with diffuse canal stenosis C3/4, C5/6 and C6/7, severe cord compression C3/4, R>L reactive edema due to facet arthritis most prevalent at C5/6, multilevel compressive disc bulge throughout thoracic spine and multilevel change lumbar spine L2-L5 with severe spinal stenosis L4/5. She was evaluated by Dr. Annette Stable who recommended cervical decompression due to myelopathy and she was cleared for surgery by Dr. Caryl Comes. She underwent cervical decompression C3- C7 with ACDF on 05/24/17. Therapy ongoing and patient has had decline in functional status in last 3-4 weeks affecting mobility and ability to carry out ADLs.   CIR recommended by MD. Pt admited to CIR on 11/14 with order for BSE on 05/28/17 d/t pt report of difficulty swallowing. Pt is s/p ACDF wearing soft cervical collar and hoarse (glottal) vocal quality. Pt presents with moderate overt s/s of aspiration with thin liquids (via spoon and small cup sips) and trials of puree. Pt with multiple swallows and immediate  as well as delayed throat clear with thin liquids giving possible appearance of pharyngeal deficits. Pt with sensation of puree "coming back up" with mulitple swallows to clear. Would recommend clear liquid diet with medicine crushed finely in puree with small bites and liquid wash. Pt needs a Modified Barium Swallow Study on next available opportunity.  Skilled ST is required to decreased s/s of aspiration and safety advance diet.    Skilled Therapeutic Interventions          Skilled treatment session focused on   SLP Assessment  Patient will need skilled Speech Lanaguage Pathology Services during CIR admission    Recommendations  SLP Diet Recommendations: (Clear liquid diet) Liquid Administration via: Spoon;Cup Medication Administration: Crushed with puree Supervision: Intermittent supervision to cue for compensatory strategies;Staff to assist with self feeding Postural Changes and/or Swallow Maneuvers: Seated upright 90 degrees;Upright 30-60 min after meal Oral Care Recommendations: Oral care BID Patient destination: Home Follow up Recommendations: None Equipment Recommended: To be determined    SLP Frequency 3 to 5 out of 7 days   SLP Duration  SLP Intensity  SLP Treatment/Interventions 2 weeks  Minumum of 1-2 x/day, 30 to 90 minutes  Patient/family education;Dysphagia/aspiration precaution training    Pain    Prior Functioning Cognitive/Linguistic Baseline: Within functional limits Type of Home: House  Lives With: Alone Available Help at Discharge: Family;Available 24 hours/day Education: 8th grade education Vocation: Retired  Function:  Eating Eating     Eating Assist Level: Set up assist for   Eating Set Up Assist For: Opening containers       Cognition Comprehension Comprehension assist level: Follows complex conversation/direction with extra time/assistive device  Expression   Expression assist level: Expresses complex ideas: With extra time/assistive device  Social Interaction Social Interaction assist level: Interacts appropriately with others - No medications needed.  Problem Solving Problem solving assist level: Solves complex 90% of the time/cues < 10% of the time  Memory Memory assist level: Recognizes or recalls 75 - 89% of the time/requires cueing 10 - 24% of the time   Short Term  Goals: Week 1: SLP Short Term Goal 1 (Week 1): Pt will consume thin liquids with minimal overt s/s of aspiration.  SLP Short Term Goal 2 (Week 1): Pt will complete Modified Barium Swallow Study to determine safest diet.   Refer to Care Plan for Long Term Goals  Recommendations for other services: None   Discharge Criteria: Patient will be discharged from SLP if patient refuses treatment 3 consecutive times without medical reason, if treatment goals not met, if there is a change in medical status, if patient makes no progress towards goals or if patient is discharged from hospital.  The above assessment, treatment plan, treatment alternatives and goals were discussed and mutually agreed upon: by patient  Denim Kalmbach 05/28/2017, 2:45 PM

## 2017-05-29 ENCOUNTER — Inpatient Hospital Stay (HOSPITAL_COMMUNITY): Payer: Medicare Other | Admitting: Speech Pathology

## 2017-05-29 ENCOUNTER — Inpatient Hospital Stay (HOSPITAL_COMMUNITY): Payer: Medicare Other | Admitting: Physical Therapy

## 2017-05-29 ENCOUNTER — Inpatient Hospital Stay (HOSPITAL_COMMUNITY): Payer: Medicare Other

## 2017-05-29 DIAGNOSIS — N183 Chronic kidney disease, stage 3 (moderate): Secondary | ICD-10-CM

## 2017-05-29 DIAGNOSIS — K5903 Drug induced constipation: Secondary | ICD-10-CM

## 2017-05-29 DIAGNOSIS — E871 Hypo-osmolality and hyponatremia: Secondary | ICD-10-CM

## 2017-05-29 DIAGNOSIS — I480 Paroxysmal atrial fibrillation: Secondary | ICD-10-CM

## 2017-05-29 DIAGNOSIS — I10 Essential (primary) hypertension: Secondary | ICD-10-CM

## 2017-05-29 DIAGNOSIS — G8929 Other chronic pain: Secondary | ICD-10-CM

## 2017-05-29 DIAGNOSIS — K649 Unspecified hemorrhoids: Secondary | ICD-10-CM

## 2017-05-29 DIAGNOSIS — M5440 Lumbago with sciatica, unspecified side: Secondary | ICD-10-CM

## 2017-05-29 MED ORDER — WITCH HAZEL-GLYCERIN EX PADS
MEDICATED_PAD | CUTANEOUS | Status: DC | PRN
  Filled 2017-05-29: qty 100

## 2017-05-29 MED ORDER — DOCUSATE SODIUM 100 MG PO CAPS
100.0000 mg | ORAL_CAPSULE | Freq: Two times a day (BID) | ORAL | Status: DC
  Administered 2017-05-30 (×2): 100 mg via ORAL
  Filled 2017-05-29 (×3): qty 1

## 2017-05-29 NOTE — Progress Notes (Signed)
Physical Therapy Session Note  Patient Details  Name: [name] MRN: [id number] Date of Birth: Dec 05, 1927  Today's Date: 05/29/2017 PT Individual Time: 0900-1000 PT Individual Time Calculation (min): 60 min   Short Term Goals: Week 1:  PT Short Term Goal 1 (Week 1): Pt will perform sit<>stand with min assist and LRAD  PT Short Term Goal 2 (Week 1): Pt will perform bed mobility with min assist  PT Short Term Goal 3 (Week 1): Pt will ambulate 133f with min assist and LRAD  PT Short Term Goal 4 (Week 1): Pt will perform stand pivot [facility]ant with LRAD  Skilled Therapeutic Interventions/Progress Updates:   Pt received sitting in recliner and agreeable to PT.   PT instructed [facility]. Cues for safety and proper use of BUE on arm rests  [facility]   Gait training with RW x [phone] RW support and min progressing to supervision  assist from PT. Verbal and visual cues for improved heel contact on the R as well as step width to reduce foot drop and improve neuro motor control.   Seated BLE Neuromotor control exercises.  Heel slide with pillow case to reduce friction x20  Ankle DF 3 x 12.  Hip abduction 2 x 15.   HS curls with emphasis on Eccentric movement level 2 tband 2 x 10  Cues from PT for full ROM and improved eccentric control.   Patient returned to room and [facility] in reach and all needs met.         Therapy Documentation Precautions:  Precautions Precautions: Fall, Cervical Required Braces or Orthoses: Cervical Brace Cervical Brace: Soft collar, At all times Restrictions Weight Bearing Restrictions: No Pain: 0/10   See Function Navigator for Current Functional Status.   Therapy/Group: Individual Therapy  AusLorie Phenix/17/2018, 10:05 AM

## 2017-05-29 NOTE — Progress Notes (Signed)
Subjective/Complaints: Pt seen laying in bed this AM.  She slept well overnight.  Sof-collar remains in place.  She requests meds for hemorrhoids as well as constipation.   ROS: Denies CP, SOB, N/V/D  Objective: Vital Signs: Blood pressure 121/60, pulse 67, temperature 98.1 F (36.7 C), temperature source Oral, resp. rate 20, height _0  (1.702 m), weight 81.1 kg (178 lb 12.8 oz), SpO2 98 %. No results found. Results for orders placed or performed during the hospital encounter of 05/26/17 (from the past 72 hour(s))  Comprehensive metabolic panel     Status: Abnormal   Collection Time: 05/27/17  9:27 AM  Result Value Ref Range   Sodium 130 (L) 135 - 145 mmol/L   Potassium 4.3 3.5 - 5.1 mmol/L   Chloride 97 (L) 101 - 111 mmol/L   CO2 26 22 - 32 mmol/L   Glucose, Bld 146 (H) 65 - 99 mg/dL   BUN 30 (H) 6 - 20 mg/dL   Creatinine, Ser 1.08 (H) 0.44 - 1.00 mg/dL   Calcium 8.7 (L) 8.9 - 10.3 mg/dL   Total Protein 5.8 (L) 6.5 - 8.1 g/dL   Albumin 2.9 (L) 3.5 - 5.0 g/dL   AST 36 15 - 41 U/L   ALT 20 14 - 54 U/L   Alkaline Phosphatase 53 38 - 126 U/L   Total Bilirubin 0.8 0.3 - 1.2 mg/dL   GFR calc non Af Amer 45 (L) >60 mL/min   GFR calc Af Amer 52 (L) >60 mL/min    Comment: (NOTE) The eGFR has been calculated using the CKD EPI equation. This calculation has not been validated in all clinical situations. eGFR's persistently <60 mL/min signify possible Chronic Kidney Disease.    Anion gap 7 5 - 15  CBC WITH DIFFERENTIAL     Status: Abnormal   Collection Time: 05/27/17  9:27 AM  Result Value Ref Range   WBC 7.6 4.0 - 10.5 K/uL   RBC 3.82 (L) 3.87 - 5.11 MIL/uL   Hemoglobin 11.7 (L) 12.0 - 15.0 g/dL   HCT 35.4 (L) 36.0 - 46.0 %   MCV 92.7 78.0 - 100.0 fL   MCH 30.6 26.0 - 34.0 pg   MCHC 33.1 30.0 - 36.0 g/dL   RDW 15.1 11.5 - 15.5 %   Platelets 168 150 - 400 K/uL   Neutrophils Relative % 65 %   Neutro Abs 5.0 1.7 - 7.7 K/uL   Lymphocytes Relative 11 %   Lymphs Abs 0.8 0.7 -  4.0 K/uL   Monocytes Relative 22 %   Monocytes Absolute 1.7 (H) 0.1 - 1.0 K/uL   Eosinophils Relative 2 %   Eosinophils Absolute 0.2 0.0 - 0.7 K/uL   Basophils Relative 0 %   Basophils Absolute 0.0 0.0 - 0.1 K/uL     HEENT: Incision c/d/i. Normocephalic, atraumatic. Neck: Soft collar in place Cardio: RRR and no JVD. Resp: CTA B/L and unlabored GI: BS positive and ND Skin:   Wound C/D/I and IV site clean , no erythema Neuro: Alert/Oriented Motor 4/5 Right delt, bi, tri, grip, 3/5 right  HF, KE, 4/5 ADF 5/5 LUE LLE: 4/5 proximal to distal Musc/Skel:  No edema, no tenderness Gen NAD. Well-developed. Vital signs reviewed.   Assessment/Plan: 1. Functional deficits secondary to cervical myelopathy  which require 3+ hours per day of interdisciplinary therapy in a comprehensive inpatient rehab setting. Physiatrist is providing close team supervision and 24 hour management of active medical problems listed below. Physiatrist and rehab team  continue to assess barriers to discharge/monitor patient progress toward functional and medical goals. FIM: Function - Bathing Bathing activity did not occur: N/A Position: Sitting EOB Body parts bathed by patient: Right arm, Chest, Abdomen, Front perineal area, Right upper leg, Left upper leg Body parts bathed by helper: Buttocks, Right lower leg, Left lower leg, Back  Function- Upper Body Dressing/Undressing Upper body dressing/undressing activity did not occur: N/A What is the patient wearing?: Button up shirt Pull over shirt/dress - Perfomed by patient: Thread/unthread right sleeve, Thread/unthread left sleeve Pull over shirt/dress - Perfomed by helper: Put head through opening, Pull shirt over trunk Button up shirt - Perfomed by patient: Thread/unthread left sleeve Button up shirt - Perfomed by helper: Thread/unthread right sleeve, Pull shirt around back, Button/unbutton shirt Function - Lower Body Dressing/Undressing Lower body  dressing/undressing activity did not occur: N/A What is the patient wearing?: Pants, Non-skid slipper socks Position: Sitting EOB Pants- Performed by patient: Thread/unthread right pants leg, Thread/unthread left pants leg, Pull pants up/down(Using reacher) Pants- Performed by helper: Thread/unthread right pants leg, Thread/unthread left pants leg, Pull pants up/down Non-skid slipper socks- Performed by helper: Don/doff right sock, Don/doff left sock Assist for footwear: Maximal assist Assist for lower body dressing: Touching or steadying assistance (Pt > 75%)  Function - Toileting Toileting activity did not occur: N/A Toileting steps completed by patient: Adjust clothing prior to toileting, Performs perineal hygiene, Adjust clothing after toileting Toileting steps completed by helper: Adjust clothing prior to toileting, Adjust clothing after toileting Toileting Assistive Devices: Toilet aid Assist level: Touching or steadying assistance (Pt.75%)  Function - Air cabin crew transfer activity did not occur: N/A Toilet transfer assistive device: Grab bar, Walker Assist level to toilet: Moderate assist (Pt 50 - 74%/lift or lower) Assist level from toilet: Touching or steadying assistance (Pt > 75%)  Function - Chair/bed transfer Chair/bed transfer activity did not occur: N/A Chair/bed transfer method: Ambulatory Chair/bed transfer assist level: Touching or steadying assistance (Pt > 75%) Chair/bed transfer assistive device: Armrests, Walker  Function - Locomotion: Wheelchair Type: Manual Max wheelchair distance: 23f  Assist Level: Moderate assistance (Pt 50 - 74%) Assist Level: Moderate assistance (Pt 50 - 74%) Wheel 150 feet activity did not occur: Safety/medical concerns Function - Locomotion: Ambulation Assistive device: Walker-rolling Max distance: 108 Assist level: Touching or steadying assistance (Pt > 75%) Assist level: Touching or steadying assistance (Pt >  75%) Assist level: Touching or steadying assistance (Pt > 75%) Walk 150 feet activity did not occur: Safety/medical concerns Walk 10 feet on uneven surfaces activity did not occur: Safety/medical concerns Assist level: Touching or steadying assistance (Pt > 75%)  Function - Comprehension Comprehension: Auditory Comprehension assist level: Follows complex conversation/direction with extra time/assistive device  Function - Expression Expression: Verbal Expression assist level: Expresses complex ideas: With extra time/assistive device  Function - Social Interaction Social Interaction assist level: Interacts appropriately with others - No medications needed.  Function - Problem Solving Problem solving assist level: Solves basic problems with no assist  Function - Memory Memory assist level: Recognizes or recalls 90% of the time/requires cueing < 10% of the time Patient normally able to recall (first 3 days only): Current season, That he or she is in a hospital, Location of own room, Staff names and faces  Medical Problem List and Plan: 1.  Gait abnormality and limitations in self-care secondary to cervical myelopathy.   Cont CIR  2.  DVT Prophylaxis/Anticoagulation: Mechanical: Sequential compression devices, below knee Bilateral lower extremities, no pharmacologic  due to cervical surgery 3. Pain Management: Will continue oxycodone prn. Add voltaren gel additionally with ice/heat for local measures.  4. Mood: LCSW to follow for evaluation and support.  5. Neuropsych: This patient is capable of making decisions on her own behalf. 6. Skin/Wound Care: monitor wound for healing. Add nutritional supplement to promote healing 7. Fluids/Electrolytes/Nutrition: Monitor I/O. Check lytes in am.  8. A fib: Monitor heart rate bid--on amiodarone at bedtime with toprol XL bid. Xarelto on hold, HR controlled 11/17 Vitals:   05/29/17 0413 05/29/17 1400  BP: (!) 134/53 121/60  Pulse: 73 67  Resp: 18  20  Temp: 98.7 F (37.1 C) 98.1 F (36.7 C)  SpO2: 95% 98%   9. HTN: Monitor BP bid. Continue norvasc and metoprolol,   Controlled 11/17 10 CKD: Encourage fluid intake. Monitor renal status   Cr 1.08 on 11/15   Cont to monitor 11. RLS: Managed with requip at nights and xanax prn 12. GERD: Continue protonix. 13. Seasonal allergies: On claritin and flonase--uses albuterol prn for wheezing.  14.  Chronic LBP with radiculopathy: Unable to tolerated gabapentin or tramadol. Will continue to use local measures .Oxy IR more helpful than tramadol but causing some drowsiness cont to monitor 14. Chronic constipation: Uses suppository daily. Added miralax due to reports of hard stools and now on narcotics.    Increased bowel reg again on 11/17 15. Chronic mild hyponatremia: Encourage intake.    Na+ 130 on 11/15 16.  Dysphagia due to swelling at operative site should gradually improve   Cont to monitor 17. Hemorrhoids  Tucks pads ordered 11/17  LOS (Days) 3 A FACE TO FACE EVALUATION WAS PERFORMED  Ankit Lorie Phenix 05/29/2017, 5:54 PM

## 2017-05-29 NOTE — Progress Notes (Signed)
Occupational Therapy Session Note  Patient Details  Name: [name] MRN: [id number] Date of Birth: 04-04-1928  Today's Date: 05/29/2017 OT Individual Time: 1292-9090 OT Individual Time Calculation (min): 73 min    Short Term Goals: Week 1:  OT Short Term Goal 1 (Week 1): Pt will complete 3/3 toileting tasks with steadying assist OT Short Term Goal 2 (Week 1): Pt will complete grooming task standing at sink with CGA in order to increase functional standing balance/endurance OT Short Term Goal 3 (Week 1): Pt will correctly use AE PRN during dressing tasks independently  Skilled Therapeutic Interventions/Progress Updates:    1:1. Pt reporting not feeling well, however no pain just "uncomfortable." Pt supine to sitting EOB with bed functions with supervision to sit EOB with immediate relief from discomfort. Pt stand pivot transfer with RW and MIN A to power up with VC for hand placement on walker/bed. Pt eats sitting in recliner with VC for strategies to open containers with A to stabilize container 2/2 decreased strength and [place] in BUE. OT places pillow and towel roll behind neck in recliner for improved comfort/sitting tolerance. While pt eats, discusses d/c planning, progres and OT selects LHSS for future use/educates pt on use. Pt ambulates with RW with min A and VC fo looking ahead to walk into bathroom and transfer onto toilet . Pt requires A for drawing gown up past hips. Pt requires increased time to void bowel/bladder. Pt dons pull over shirt with A to thread overhead and utilizes reacher with VC for technique to thread BLE into pants. Pt requires A to advance pants past hips and steadying for balance. Pt returns to recliner as stated above. OT exits session with pt seated in recliner with call light inreach and all needs met.   Therapy Documentation Precautions:  Precautions Precautions: Fall, Cervical Required Braces or Orthoses: Cervical Brace Cervical Brace: Soft collar, At all  times Restrictions Weight Bearing Restrictions: No  See Function Navigator for Current Functional Status.   Therapy/Group: Individual Therapy  Tonny Branch 05/29/2017, 6:54 AM

## 2017-05-29 NOTE — Progress Notes (Signed)
Physical Therapy Session Note  Patient Details  Name: Lisa GamblerDorothy W Mulgrew MRN: [id number] Date of Birth: 12/11/1927  Today's Date: 05/29/2017 PT Individual Time: 1400-1430 PT Individual Time Calculation (min): 30 min   Short Term Goals: Week 1:  PT Short Term Goal 1 (Week 1): Pt will perform sit<>stand with min assist and LRAD  PT Short Term Goal 2 (Week 1): Pt will perform bed mobility with min assist  PT Short Term Goal 3 (Week 1): Pt will ambulate 15500ft with min assist and LRAD  PT Short Term Goal 4 (Week 1): Pt will perform stand pivot transfer [facility] with min assist consistant with LRAD  Skilled Therapeutic Interventions/Progress Updates:  Pt was seen bedside in the pm. Pt transferred sit to stand with rolling walker and min to mod A with verbal cues. Pt ambulated 108 feetx 2 with rolling walker and min A. Pt transferred edge of bed to supine with min A and verbal cues. Pt was left sitting up in bed with call bell within reach.   Therapy Documentation Precautions:  Precautions Precautions: Fall, Cervical Required Braces or Orthoses: Cervical Brace Cervical Brace: Soft collar, At all times Restrictions Weight Bearing Restrictions: No General:   Pain: Pain Assessment Pain Assessment: No/denies pain Pain Score: 0-No pain   See Function Navigator for Current Functional Status.   Therapy/Group: Individual Therapy  Rayford HalstedMitchell, Rane Blitch G 05/29/2017, 2:24 PM

## 2017-05-29 NOTE — Progress Notes (Signed)
Speech Language Pathology Daily Session Note  Patient Details  Name: [name] MRN: [id number] Date of Birth: 02/05/1928  Today's Date: 05/29/2017 SLP Individual Time: 5284-13241305-1345 SLP Individual Time Calculation (min): 40 min  Short Term Goals: Week 1: SLP Short Term Goal 1 (Week 1): Pt will consume thin liquids with minimal overt s/s of aspiration.  SLP Short Term Goal 2 (Week 1): Pt will complete Modified Barium Swallow Study to determine safest diet.   Skilled Therapeutic Interventions:  Pt was seen for skilled ST targeting dysphagia goals.  Pt consumed a clear liquids diet with intermittent coughing and throat clearing which occurred even when pt diligently utilized slow rate and took small sips.  Continue with plan for MBS on Monday to determine safest diet.  Pt left in chair with call bell within reach.  Continue per current plan of care.      Function:  Eating Eating   Modified Consistency Diet: Yes Eating Assist Level: Set up assist for   Eating Set Up Assist For: Opening containers       Cognition Comprehension Comprehension assist level: Follows complex conversation/direction with extra time/assistive device  Expression   Expression assist level: Expresses complex ideas: With extra time/assistive device  Social Interaction Social Interaction assist level: Interacts appropriately with others - No medications needed.  Problem Solving Problem solving assist level: Solves complex 90% of the time/cues < 10% of the time  Memory Memory assist level: Recognizes or recalls 90% of the time/requires cueing < 10% of the time    Pain Pain Assessment Pain Assessment: No/denies pain Pain Score: 0-No pain  Therapy/Group: Individual Therapy  Shawnell Dykes, Melanee SpryNicole L 05/29/2017, 1:48 PM

## 2017-05-30 MED ORDER — PANTOPRAZOLE SODIUM 40 MG PO PACK
40.0000 mg | PACK | Freq: Every day | ORAL | Status: DC
  Administered 2017-05-31 – 2017-06-04 (×5): 40 mg via ORAL
  Filled 2017-05-30 (×4): qty 20

## 2017-05-30 MED ORDER — DOCUSATE SODIUM 50 MG/5ML PO LIQD
100.0000 mg | Freq: Two times a day (BID) | ORAL | Status: DC
  Filled 2017-05-30: qty 10

## 2017-05-30 NOTE — Progress Notes (Signed)
Subjective/Complaints: Pt seen sitting up in bed this AM.  She states she slept well overnight.  She states she had a better day yesterday and slept better overnight and feels better overall today.    ROS: Denies CP, SOB, N/V/D  Objective: Vital Signs: Blood pressure (!) 154/59, pulse 65, temperature 97.7 F (36.5 C), temperature source Oral, resp. rate 18, height 5\' 7"  (1.702 m), weight 81.1 kg (178 lb 12.8 oz), SpO2 96 %. No results found. No results found for this or any previous visit (from the past 72 hour(s)).   HEENT: Incision c/d/i. Normocephalic, atraumatic. Neck: Soft collar in place Cardio: RRR and no JVD. Resp: CTA B/L and unlabored GI: BS positive and ND Skin:   Wound C/D/I and IV site clean , no erythema Neuro: Alert/Oriented Motor 4/5 Right delt, bi, tri, grip, 3/5 right  HF, KE, 4/5 ADF (stable) 5/5 LUE LLE: 4/5 proximal to distal Musc/Skel:  No edema, no tenderness Gen NAD. Well-developed. Vital signs reviewed.   Assessment/Plan: 1. Functional deficits secondary to cervical myelopathy  which require 3+ hours per day of interdisciplinary therapy in a comprehensive inpatient rehab setting. Physiatrist is providing close team supervision and 24 hour management of active medical problems listed below. Physiatrist and rehab team continue to assess barriers to discharge/monitor patient progress toward functional and medical goals. FIM: Function - Bathing Bathing activity did not occur: N/A Position: Sitting EOB Body parts bathed by patient: Right arm, Chest, Abdomen, Front perineal area, Right upper leg, Left upper leg Body parts bathed by helper: Buttocks, Right lower leg, Left lower leg, Back  Function- Upper Body Dressing/Undressing Upper body dressing/undressing activity did not occur: N/A What is the patient wearing?: Button up shirt Pull over shirt/dress - Perfomed by patient: Thread/unthread right sleeve, Thread/unthread left sleeve Pull over shirt/dress -  Perfomed by helper: Put head through opening, Pull shirt over trunk Button up shirt - Perfomed by patient: Thread/unthread left sleeve Button up shirt - Perfomed by helper: Thread/unthread right sleeve, Pull shirt around back, Button/unbutton shirt Function - Lower Body Dressing/Undressing Lower body dressing/undressing activity did not occur: N/A What is the patient wearing?: Pants, Non-skid slipper socks Position: Sitting EOB Pants- Performed by patient: Thread/unthread right pants leg, Thread/unthread left pants leg, Pull pants up/down(Using reacher) Pants- Performed by helper: Thread/unthread right pants leg, Thread/unthread left pants leg, Pull pants up/down Non-skid slipper socks- Performed by helper: Don/doff right sock, Don/doff left sock Assist for footwear: Maximal assist Assist for lower body dressing: Touching or steadying assistance (Pt > 75%)  Function - Toileting Toileting activity did not occur: N/A Toileting steps completed by patient: Adjust clothing prior to toileting, Performs perineal hygiene, Adjust clothing after toileting Toileting steps completed by helper: Adjust clothing prior to toileting, Adjust clothing after toileting Toileting Assistive Devices: Toilet aid Assist level: Touching or steadying assistance (Pt.75%)  Function - ArchivistToilet Transfers Toilet transfer activity did not occur: N/A Toilet transfer assistive device: Grab bar, Walker Assist level to toilet: Moderate assist (Pt 50 - 74%/lift or lower) Assist level from toilet: Touching or steadying assistance (Pt > 75%)  Function - Chair/bed transfer Chair/bed transfer activity did not occur: N/A Chair/bed transfer method: Ambulatory Chair/bed transfer assist level: Touching or steadying assistance (Pt > 75%) Chair/bed transfer assistive device: Armrests, Walker  Function - Locomotion: Wheelchair Type: Manual Max wheelchair distance: 3950ft  Assist Level: Moderate assistance (Pt 50 - 74%) Assist Level:  Moderate assistance (Pt 50 - 74%) Wheel 150 feet activity did not occur: Safety/medical concerns  Function - Locomotion: Ambulation Assistive device: Walker-rolling Max distance: 108 Assist level: Touching or steadying assistance (Pt > 75%) Assist level: Touching or steadying assistance (Pt > 75%) Assist level: Touching or steadying assistance (Pt > 75%) Walk 150 feet activity did not occur: Safety/medical concerns Walk 10 feet on uneven surfaces activity did not occur: Safety/medical concerns Assist level: Touching or steadying assistance (Pt > 75%)  Function - Comprehension Comprehension: Auditory Comprehension assist level: Follows complex conversation/direction with extra time/assistive device  Function - Expression Expression: Verbal Expression assist level: Expresses complex ideas: With extra time/assistive device  Function - Social Interaction Social Interaction assist level: Interacts appropriately with others - No medications needed.  Function - Problem Solving Problem solving assist level: Solves basic problems with no assist  Function - Memory Memory assist level: Recognizes or recalls 90% of the time/requires cueing < 10% of the time Patient normally able to recall (first 3 days only): Current season, That he or she is in a hospital, Location of own room, Staff names and faces  Medical Problem List and Plan: 1.  Gait abnormality and limitations in self-care secondary to cervical myelopathy.   Cont CIR  2.  DVT Prophylaxis/Anticoagulation: Mechanical: Sequential compression devices, below knee Bilateral lower extremities, no pharmacologic due to cervical surgery 3. Pain Management: Will continue oxycodone prn. Add voltaren gel additionally with ice/heat for local measures.  4. Mood: LCSW to follow for evaluation and support.  5. Neuropsych: This patient is capable of making decisions on her own behalf. 6. Skin/Wound Care: monitor wound for healing. Add nutritional  supplement to promote healing 7. Fluids/Electrolytes/Nutrition: Monitor I/O. Check lytes in am.  8. A fib: Monitor heart rate bid--on amiodarone at bedtime with toprol XL bid. Xarelto on hold, HR controlled 11/18 Vitals:   05/29/17 2119 05/30/17 0331  BP: (!) 160/70 (!) 154/59  Pulse: 79 65  Resp:  18  Temp:  97.7 F (36.5 C)  SpO2:  96%   9. HTN: Monitor BP bid. Continue norvasc and metoprolol,   ?Trending up, will consider med adjustments tomorrow if persistently elevated 10 CKD: Encourage fluid intake. Monitor renal status   Cr 1.08 on 11/15   Cont to monitor 11. RLS: Managed with requip at nights and xanax prn 12. GERD: Continue protonix. 13. Seasonal allergies: On claritin and flonase--uses albuterol prn for wheezing.  14.  Chronic LBP with radiculopathy: Unable to tolerated gabapentin or tramadol. Will continue to use local measures .Oxy IR more helpful than tramadol but causing some drowsiness cont to monitor 14. Chronic constipation: Uses suppository daily. Added miralax due to reports of hard stools and now on narcotics.    Increased bowel reg again on 11/17   Improving 15. Chronic mild hyponatremia: Encourage intake.    Na+ 130 on 11/15 16.  Dysphagia due to swelling at operative site should gradually improve   Cont to monitor 17. Hemorrhoids  Tucks pads ordered 11/17  LOS (Days) 4 A FACE TO FACE EVALUATION WAS PERFORMED  Ankit Karis Jubanil Patel 05/30/2017, 9:28 AM

## 2017-05-31 ENCOUNTER — Inpatient Hospital Stay (HOSPITAL_COMMUNITY): Payer: Medicare Other | Admitting: Physical Therapy

## 2017-05-31 ENCOUNTER — Inpatient Hospital Stay (HOSPITAL_COMMUNITY): Payer: Medicare Other | Admitting: Speech Pathology

## 2017-05-31 ENCOUNTER — Inpatient Hospital Stay (HOSPITAL_COMMUNITY): Payer: Medicare Other

## 2017-05-31 ENCOUNTER — Inpatient Hospital Stay (HOSPITAL_COMMUNITY): Payer: Medicare Other | Admitting: Occupational Therapy

## 2017-05-31 MED ORDER — SENNOSIDES 8.8 MG/5ML PO SYRP
10.0000 mL | ORAL_SOLUTION | Freq: Every day | ORAL | Status: DC
  Filled 2017-05-31 (×9): qty 10

## 2017-05-31 MED ORDER — RIVAROXABAN 15 MG PO TABS
15.0000 mg | ORAL_TABLET | Freq: Every day | ORAL | Status: DC
  Administered 2017-05-31 – 2017-06-06 (×7): 15 mg via ORAL
  Filled 2017-05-31 (×7): qty 1

## 2017-05-31 MED ORDER — HYDROCORTISONE ACE-PRAMOXINE 1-1 % RE FOAM
1.0000 | Freq: Two times a day (BID) | RECTAL | Status: AC
  Administered 2017-05-31 – 2017-06-05 (×9): 1 via RECTAL
  Filled 2017-05-31 (×3): qty 10

## 2017-05-31 NOTE — Progress Notes (Signed)
Modified Barium Swallow Progress Note  Patient Details  Name: [name] MRN: [id number] Date of Birth: 08/10/1927  Today's Date: 05/31/2017  Modified Barium Swallow completed.  Full report located under Chart Review in the Imaging Section.  Brief recommendations include the following:  Clinical Impression  Pt is s/p ACDF with noted pharyngeal swelling around C6 and soft cervical collar in place. Pt presents with mild pharyngeal deficits resulting in mild to moderate residue in vallecula and pyriform sinuses which is cleared with voluntary multiple swallows. With trials of thin liquids via straw pt with 1 episode of sensed mild frank penetration which was cleared during swallow. There was one episode of throat clearing (mild) in response to residue from puree. Cued mutliple swallows and liquid wash helped effectively clear puree residue. Pt consumed diced peaches with functional ability. When given whole pill whole in appleasuae, pt with difficulty clearing pill at the hieght of the larygneal vestibule and needed cues for multiple swallows. Pt independently performs multiple swallows/ double swallows throughout consumption therefor I recommend dysphagia 2 with thin liquids and medicine crushed in applesauce. Intermittent supervision recommended to remind pt of mutliple swallows.    Swallow Evaluation Recommendations       SLP Diet Recommendations: Dysphagia 2 (Fine chop) solids;Thin liquid   Liquid Administration via: Spoon;Cup;Straw   Medication Administration: Crushed with puree   Supervision: Intermittent supervision to cue for compensatory strategies   Compensations: Small sips/bites;Follow solids with liquid;Other (Comment)(voluntary multiple swallows)   Postural Changes: Remain semi-upright after after feeds/meals (Comment);Seated upright at 90 degrees   Oral Care Recommendations: Oral care BID        Lamondre Wesche 05/31/2017,1:06 PM

## 2017-05-31 NOTE — Progress Notes (Signed)
Subjective/Complaints:  No issues overnite swallow is doing a little better.   ROS: Denies CP, SOB, N/V/D  Objective: Vital Signs: Blood pressure (!) 150/61, pulse 64, temperature 97.9 F (36.6 C), temperature source Oral, resp. rate 18, height 5\' 7"  (1.702 m), weight 81.1 kg (178 lb 12.8 oz), SpO2 95 %. No results found. No results found for this or any previous visit (from the past 72 hour(s)).   HEENT: Incision c/d/i. Normocephalic, atraumatic. Neck: Soft collar in place Cardio: RRR and no JVD. Resp: CTA B/L and unlabored GI: BS positive and ND Skin:   Wound C/D/I and IV site clean , no erythema, ecchymosis in upper chest area Neuro: Alert/Oriented Motor 4/5 Right delt, bi, tri, grip, 3/5 right  HF, KE, 4/5 ADF (stable) 5/5 LUE LLE: 4/5 proximal to distal Musc/Skel:  No edema, no tenderness Gen NAD. Well-developed. Vital signs reviewed.   Assessment/Plan: 1. Functional deficits secondary to cervical myelopathy  which require 3+ hours per day of interdisciplinary therapy in a comprehensive inpatient rehab setting. Physiatrist is providing close team supervision and 24 hour management of active medical problems listed below. Physiatrist and rehab team continue to assess barriers to discharge/monitor patient progress toward functional and medical goals. FIM: Function - Bathing Bathing activity did not occur: N/A Position: Sitting EOB Body parts bathed by patient: Right arm, Chest, Abdomen, Front perineal area, Right upper leg, Left upper leg Body parts bathed by helper: Buttocks, Right lower leg, Left lower leg, Back  Function- Upper Body Dressing/Undressing Upper body dressing/undressing activity did not occur: N/A What is the patient wearing?: Button up shirt Pull over shirt/dress - Perfomed by patient: Thread/unthread right sleeve, Thread/unthread left sleeve Pull over shirt/dress - Perfomed by helper: Put head through opening, Pull shirt over trunk Button up shirt -  Perfomed by patient: Thread/unthread left sleeve Button up shirt - Perfomed by helper: Thread/unthread right sleeve, Pull shirt around back, Button/unbutton shirt Function - Lower Body Dressing/Undressing Lower body dressing/undressing activity did not occur: N/A What is the patient wearing?: Pants, Non-skid slipper socks Position: Sitting EOB Pants- Performed by patient: Thread/unthread right pants leg, Thread/unthread left pants leg, Pull pants up/down(Using reacher) Pants- Performed by helper: Thread/unthread right pants leg, Thread/unthread left pants leg, Pull pants up/down Non-skid slipper socks- Performed by helper: Don/doff right sock, Don/doff left sock Assist for footwear: Maximal assist Assist for lower body dressing: Touching or steadying assistance (Pt > 75%)  Function - Toileting Toileting activity did not occur: N/A Toileting steps completed by patient: Performs perineal hygiene, Adjust clothing after toileting Toileting steps completed by helper: Adjust clothing prior to toileting Toileting Assistive Devices: Toilet aid Assist level: More than reasonable time  Function - ArchivistToilet Transfers Toilet transfer activity did not occur: N/A Toilet transfer assistive device: Walker, Grab bar, Elevated toilet seat/BSC over toilet Assist level to toilet: Supervision or verbal cues Assist level from toilet: Supervision or verbal cues  Function - Chair/bed transfer Chair/bed transfer activity did not occur: N/A Chair/bed transfer method: Ambulatory Chair/bed transfer assist level: Supervision or verbal cues Chair/bed transfer assistive device: Armrests, Walker Chair/bed transfer details: Verbal cues for precautions/safety  Function - Locomotion: Wheelchair Type: Manual Max wheelchair distance: 7950ft  Assist Level: Moderate assistance (Pt 50 - 74%) Assist Level: Moderate assistance (Pt 50 - 74%) Wheel 150 feet activity did not occur: Safety/medical concerns Function - Locomotion:  Ambulation Assistive device: Walker-rolling Max distance: 108 Assist level: Touching or steadying assistance (Pt > 75%) Assist level: Touching or steadying assistance (Pt >  75%) Assist level: Touching or steadying assistance (Pt > 75%) Walk 150 feet activity did not occur: Safety/medical concerns Walk 10 feet on uneven surfaces activity did not occur: Safety/medical concerns Assist level: Touching or steadying assistance (Pt > 75%)  Function - Comprehension Comprehension: Auditory Comprehension assist level: Follows complex conversation/direction with extra time/assistive device  Function - Expression Expression: Verbal Expression assist level: Expresses complex ideas: With extra time/assistive device  Function - Social Interaction Social Interaction assist level: Interacts appropriately with others - No medications needed.  Function - Problem Solving Problem solving assist level: Solves basic problems with no assist  Function - Memory Memory assist level: Recognizes or recalls 90% of the time/requires cueing < 10% of the time Patient normally able to recall (first 3 days only): Current season, That he or she is in a hospital, Location of own room, Staff names and faces  Medical Problem List and Plan: 1.  Gait abnormality and limitations in self-care secondary to cervical myelopathy.   Cont CIR PT, OT, SLP 2.  DVT Prophylaxis/Anticoagulation: Mechanical: Sequential compression devices, below knee Bilateral lower extremities, non pharmacologic due to cervical surgery 3. Pain Management: Will continue oxycodone prn. Add voltaren gel additionally with ice/heat for local measures.  4. Mood: LCSW to follow for evaluation and support.  5. Neuropsych: This patient is capable of making decisions on her own behalf. 6. Skin/Wound Care: monitor wound for healing. Add nutritional supplement to promote healing 7. Fluids/Electrolytes/Nutrition: Monitor I/O. Check lytes in am.  8. A fib:  Monitor heart rate bid--on amiodarone at bedtime with toprol XL bid. Xarelto on hold, HR controlled 11/18 Vitals:   05/30/17 1408 05/31/17 0437  BP: (!) 110/59 (!) 150/61  Pulse: 79 64  Resp: 18 18  Temp: (!) 97.4 F (36.3 C) 97.9 F (36.6 C)  SpO2: 94% 95%   9. HTN: Monitor BP bid. Continue norvasc and metoprolol,   labile will monitor for trend 10 CKD: Encourage fluid intake. Monitor renal status   Cr 1.08 on 11/15   Cont to monitor 11. RLS: Managed with requip at nights and xanax prn 12. GERD: Continue protonix. 13. Seasonal allergies: On claritin and flonase--uses albuterol prn for wheezing.  14.  Chronic LBP with radiculopathy: Unable to tolerated gabapentin or tramadol. Will continue to use local measures .Oxy IR more helpful than tramadol but causing some drowsiness cont to monitor 14. Chronic constipation: Uses suppository daily. Added miralax due to reports of hard stools and now on narcotics.    Increased bowel reg again on 11/17   Improving 15. Chronic mild hyponatremia: Encourage intake.    Na+ 130 on 11/15 16.  Dysphagia due to swelling at operative site should gradually improve   Cont to monitor 17. Hemorrhoids  Tucks pads ordered 11/17  LOS (Days) 5 A FACE TO FACE EVALUATION WAS PERFORMED  Erick Colacendrew E Haylen Bellotti 05/31/2017, 8:31 AM

## 2017-05-31 NOTE — Progress Notes (Signed)
Speech Language Pathology Daily Session Note  Patient Details  Name: [name] MRN: [id number] Date of Birth: May 05, 1928  Today's Date: 05/31/2017   Skilled treatment session #1 SLP Individual Time: 1540-0867 SLP Individual Time Calculation (min): 10 min   Skilled treatment session #2 SLP Individual time: 1300-1330 SLP Individual Time Calculation (min): 30 min  Short Term Goals: Week 1: SLP Short Term Goal 1 (Week 1): Pt will consume dysphagia 2 with thin liquids with minimal overt s/s of aspiration.  SLP Short Term Goal 2 (Week 1): Pt will complete Modified Barium Swallow Study to determine safest diet.  SLP Short Term Goal 2 - Progress (Week 1): met   Skilled Therapeutic Interventions:  Skilled treatment session #1 focused on caregiver education on results of Modified Barium Swallow study and diet recommendation. Pt voiced understanding.   Skilled treatment session #2 focused on dysphagia goals. SLP facilitated session by providing skilled observation of pt consuming thin liquids. Pt with independent multiple swallows (which were effective on MBS) and no overt s/s of aspiration during consumption. Pt left upright in recliner with all needs in reach. Continue per current plan of care for possibility of further diet advancement.      Function:  Eating Eating   Modified Consistency Diet: No             Cognition Comprehension Comprehension assist level: Follows complex conversation/direction with extra time/assistive device  Expression   Expression assist level: Expresses complex ideas: With extra time/assistive device  Social Interaction Social Interaction assist level: Interacts appropriately with others - No medications needed.  Problem Solving Problem solving assist level: Solves basic problems with no assist  Memory Memory assist level: Recognizes or recalls 90% of the time/requires cueing < 10% of the time    Pain Pain Assessment Pain Assessment: 0-10 Pain  Score: 0-No pain  Therapy/Group: Individual Therapy  [name] 05/31/2017, 1:14 PM

## 2017-05-31 NOTE — Progress Notes (Signed)
Occupational Therapy Session Note  Patient Details  Name: [name] MRN: [id number] Date of Birth: 10/08/1927  Today's Date: 05/31/2017 OT Individual Time: 4098-11910730-0830 OT Individual Time Calculation (min): 60 min    Short Term Goals: Week 1:  OT Short Term Goal 1 (Week 1): Pt will complete 3/3 toileting tasks with steadying assist OT Short Term Goal 2 (Week 1): Pt will complete grooming task standing at sink with CGA in order to increase functional standing balance/endurance OT Short Term Goal 3 (Week 1): Pt will correctly use AE PRN during dressing tasks independently  Skilled Therapeutic Interventions/Progress Updates:    Pt seen for OT ADL bathing/dressing session. Pt sitting EOB upon arrival eating breakfast and agreeable to tx session. Pt finished breakfast seated EOB, able to use R UE to self feed without need for AE. She ambulated into bathroom with close supervision using RW and steadying assist provided while pt managed clothing.  She bathed seated on 3-1 BSC, assist for set-up and supervision assist, using LH sponge to assist with LB bathing. She returned to w/c to dress, increased assist provided today due to time constraints.  Pt left seated in w/c at end of session with hand off to NT to assist with grooming   Therapy Documentation Precautions:  Precautions Precautions: Fall, Cervical Required Braces or Orthoses: Cervical Brace Cervical Brace: Soft collar, At all times Restrictions Weight Bearing Restrictions: No Pain:   No/ denies pain  See Function Navigator for Current Functional Status.   Therapy/Group: Individual Therapy  Lewis, Nile Prisk C 05/31/2017, 7:06 AM

## 2017-05-31 NOTE — Progress Notes (Signed)
Physical Therapy Session Note  Patient Details  Name: [name] MRN: [id number] Date of Birth: 1928-05-06  Today's Date: 05/31/2017 PT Individual Time: 4888-9169 PT Individual Time Calculation (min): 75 min   Short Term Goals: Week 1:  PT Short Term Goal 1 (Week 1): Pt will perform sit<>stand with min assist and LRAD  PT Short Term Goal 2 (Week 1): Pt will perform bed mobility with min assist  PT Short Term Goal 3 (Week 1): Pt will ambulate 170f with min assist and LRAD  PT Short Term Goal 4 (Week 1): [facility]ant with LRAD  Skilled Therapeutic Interventions/Progress Updates: Pt presented in recliner hand off from NT ambulated to bathroom (+void/BM). Pt able to perform self peri-hygiene in standing with washcloth. Pt ambulated to sink min guard and performed hand hygiene at supervision level. Once returned to sitting, PTA donned shoes total assist for time management. Pt ambulated 1061fwith RW min guard with cues for increasing toe up and heel strike. Pt performed standing balance activities including alternating toe taps and standing while using peg board. Pt required intermittent seated breaks due to fatigue. Throughout session pt performed sit to/from stand with min guard and improved hand placement. Pt transported to day room and use of NuStep L2 x 10 min while maintaining avg SPM 30 and speaking with RT. Pt transported back to room and returned to recliner in same manner as prior. Pt left in chair with call bell within reach and needs met.      Therapy Documentation Precautions:  Precautions Precautions: Fall, Cervical Required Braces or Orthoses: Cervical Brace Cervical Brace: Soft collar, At all times Restrictions Weight Bearing Restrictions: (P) No See Function Navigator for Current Functional Status.   Therapy/Group: Individual Therapy  Kenichi Cassada  Juda Toepfer, PTA  05/31/2017, 3:49 PM

## 2017-06-01 ENCOUNTER — Inpatient Hospital Stay (HOSPITAL_COMMUNITY): Payer: Medicare Other | Admitting: Speech Pathology

## 2017-06-01 ENCOUNTER — Inpatient Hospital Stay (HOSPITAL_COMMUNITY): Payer: Medicare Other | Admitting: Physical Therapy

## 2017-06-01 ENCOUNTER — Ambulatory Visit (HOSPITAL_COMMUNITY): Payer: Medicare Other | Admitting: Speech Pathology

## 2017-06-01 ENCOUNTER — Inpatient Hospital Stay (HOSPITAL_COMMUNITY): Payer: Medicare Other | Admitting: Occupational Therapy

## 2017-06-01 LAB — CBC
HCT: 33.2 % — ABNORMAL LOW (ref 36.0–46.0)
HEMOGLOBIN: 10.8 g/dL — AB (ref 12.0–15.0)
MCH: 29.8 pg (ref 26.0–34.0)
MCHC: 32.5 g/dL (ref 30.0–36.0)
MCV: 91.5 fL (ref 78.0–100.0)
PLATELETS: 198 10*3/uL (ref 150–400)
RBC: 3.63 MIL/uL — AB (ref 3.87–5.11)
RDW: 14.5 % (ref 11.5–15.5)
WBC: 5.6 10*3/uL (ref 4.0–10.5)

## 2017-06-01 LAB — BASIC METABOLIC PANEL
Anion gap: 7 (ref 5–15)
BUN: 22 mg/dL — ABNORMAL HIGH (ref 6–20)
CALCIUM: 8.6 mg/dL — AB (ref 8.9–10.3)
CHLORIDE: 101 mmol/L (ref 101–111)
CO2: 28 mmol/L (ref 22–32)
CREATININE: 1.07 mg/dL — AB (ref 0.44–1.00)
GFR calc Af Amer: 53 mL/min — ABNORMAL LOW (ref >60)
GFR, EST NON AFRICAN AMERICAN: 45 mL/min — AB (ref >60)
GLUCOSE: 92 mg/dL (ref 65–99)
POTASSIUM: 3.9 mmol/L (ref 3.5–5.1)
Sodium: 136 mmol/L (ref 135–145)

## 2017-06-01 NOTE — Progress Notes (Signed)
Physical Therapy Session Note  Patient Details  Name: [name] MRN: [id number] Date of Birth: 01/16/1928  Today's Date: 06/01/2017 PT Individual Time: 1300-1330 PT Individual Time Calculation (min): 30 min   Short Term Goals: Week 1:  PT Short Term Goal 1 (Week 1): Pt will perform sit<>stand with min assist and LRAD  PT Short Term Goal 2 (Week 1): Pt will perform bed mobility with min assist  PT Short Term Goal 3 (Week 1): Pt will ambulate 17700ft with min assist and LRAD  PT Short Term Goal 4 (Week 1): Pt will perform stand [facility] min assist consistant with LRAD  Skilled Therapeutic Interventions/Progress Updates: Pt received seated in recliner, denies pain and agreeable to treatment. Requires assist to don R shoe after unsuccessful attempt, states "that leg just won't do right". Sit >stand from low recliner required modA for boosting to stand. Ambulatory transfer x10' with RW to w/c. Gait on carpeted surface x45', x90' with RW and min guard, including reaching to pull out chair to sit in. Tactile cueing at R glute for stance control d/t noted hip instability and knee recurvatum in stance. Returned to recliner with min guard ambulatory transfer with RW. Remained seated in recliner, family present and all needs in reach.      Therapy Documentation Precautions:  Precautions Precautions: Fall, Cervical Required Braces or Orthoses: Cervical Brace Cervical Brace: Soft collar, At all times Restrictions Weight Bearing Restrictions: No Pain: Pain Assessment Pain Assessment: No/denies pain   See Function Navigator for Current Functional Status.   Therapy/Group: Individual Therapy  [name] 06/01/2017, 1:25 PM

## 2017-06-01 NOTE — Progress Notes (Signed)
Physical Therapy Session Note  Patient Details  Name: Lisa GamblerDorothy W Baucum MRN: [id number] Date of Birth: 01/31/1928  Today's Date: 06/01/2017 PT Individual Time: 1000-1100 PT Individual Time Calculation (min): 60 min   Short Term Goals: Week 1:  PT Short Term Goal 1 (Week 1): Pt will perform sit<>stand with min assist and LRAD  PT Short Term Goal 2 (Week 1): Pt will perform bed mobility with min assist  PT Short Term Goal 3 (Week 1): Pt will ambulate 11900ft with min assist and LRAD  PT Short Term Goal 4 (Week 1): Pt will perform stand [facility] consistant with LRAD  Skilled Therapeutic Interventions/Progress Updates: Pt presented in recliner with granddaughter present and agreeable to therapy. Pt ambulated 7072ft and 8064ft with min guard with seated rest between. Pt transported to rehab gym and performed standing balance and coordination activities on compliant and non-compliant surfaces. Pt able to maintain fair standing balance with mod challenges. Pt demonstrated sit to/from stand transfers at min guard level throughout session with intermittent cues for hand placement. Discussed with pt and granddaughter home set up and providing supervision level upon d/c. Pt propelled w/c back to room for endurance and transferred w/c to recliner with min guard. Pt remained in recliner at end of session with call bell within reach and granddaughter present.      Therapy Documentation Precautions:  Precautions Precautions: Fall, Cervical Required Braces or Orthoses: Cervical Brace Cervical Brace: Soft collar, At all times Restrictions Weight Bearing Restrictions: No General:   Vital Signs:    See Function Navigator for Current Functional Status.   Therapy/Group: Individual Therapy  Comfort Iversen  Merridy Pascoe, PTA  06/01/2017, 12:53 PM

## 2017-06-01 NOTE — Progress Notes (Signed)
Occupational Therapy Session Note  Patient Details  Name: Lisa GamblerDorothy W Feliciano MRN: [id number] Date of Birth: 08/09/1927  Today's Date: 06/01/2017 OT Individual Time: 0700-0800 OT Individual Time Calculation (min): 60 min    Short Term Goals: Week 1:  OT Short Term Goal 1 (Week 1): Pt will complete 3/3 toileting tasks with steadying assist OT Short Term Goal 2 (Week 1): Pt will complete grooming task standing at sink with CGA in order to increase functional standing balance/endurance OT Short Term Goal 3 (Week 1): Pt will correctly use AE PRN during dressing tasks independently  Skilled Therapeutic Interventions/Progress Updates:    Pt seen for OT ADL bathing/dressing session. Pt in supine upon arrival, easily awoken and agreeable to tx session. Required increased time coming to EOB using hospital bed functions. She ambulated with RW and CGA into bathroom to complete toileting task and bathed seated on 3-1 BSC using LH soonge to assist with LB. She stood at grab bars with supervision to complete pericare/ buttock hygiene. She dressed seated on toilet, motivated to complete LB dressing without use of AE, which she was able to do with increased time. She completed grooming tasks standing at sink with supervision. Pt left seated in w/c at end of session, set-up with breakfast tray and eating with granddaughter.  Throughout session, discussed OT goals, LOS, DME and d/c planning.   Therapy Documentation Precautions:  Precautions Precautions: Fall, Cervical Required Braces or Orthoses: Cervical Brace Cervical Brace: Soft collar, At all times Restrictions Weight Bearing Restrictions: No Pain:   No/ denies pain  See Function Navigator for Current Functional Status.   Therapy/Group: Individual Therapy  Lewis, Donny Heffern C 06/01/2017, 6:32 AM

## 2017-06-01 NOTE — Progress Notes (Signed)
Subjective/Complaints:  Patient seen while eating breakfast she has no complaints today.   ROS: Denies CP, SOB, N/V/D  Objective: Vital Signs: Blood pressure (!) 146/64, pulse 64, temperature 98 F (36.7 C), temperature source Oral, resp. rate 16, height '5\' 7"'$  (1.702 m), weight 81.1 kg (178 lb 12.8 oz), SpO2 97 %. Dg Swallowing Func-speech Pathology  Result Date: 05/31/2017 Objective Swallowing Evaluation: Type of Study: MBS-Modified Barium Swallow Study  Patient Details Name: JEANNI ALLSHOUSE MRN: [id number] Date of Birth: September 23, 1927 Today's Date: 05/31/2017 Time: SLP Start Time (ACUTE ONLY): 1001 -SLP Stop Time (ACUTE ONLY): 1030 SLP Time Calculation (min) (ACUTE ONLY): 29 min Past Medical History: Past Medical History: Diagnosis Date . Arthritis  . Chronic kidney disease  . Depression  . Dysrhythmia 8/13  palpitations/ now resolved . GERD (gastroesophageal reflux disease)  . History of blood transfusion 2012 . Hypertension  . Peripheral vascular disease (HCC)   varicose veins bilaterally  . Restless legs syndrome  Past Surgical History: Past Surgical History: Procedure Laterality Date . ABDOMINAL HYSTERECTOMY   . ANTERIOR CERVICAL DECOMPRESSION/DISCECTOMY FUSION CERVICAL THREE-FOUR, CERVICAL FIVE-SIX, CERVICAL SIX-SEVEN N/A 05/24/2017  Performed by Earnie Larsson, MD at [facility]   . EYE SURGERY Bilateral   cataract extraction with IOL . JOINT REPLACEMENT Right   knee . LEFT TOTAL KNEE ARTHROPLASTY Left 11/07/2012  Performed by Gearlean Alf, MD at [facility] ORS . SEPTOPLASTY    repair deviated septum . VARICOSE VEIN SURGERY Bilateral  HPI: 83 y.o. female, with past medical history significant for chronic kidney disease , osteoarthritis , arthritis of the spine  and atrial fibrillation on Eliquis presenting with 2 weeks history of right-sided numbness and weakness that the patient reports it got worse after his steroid spinal injection done at the orthopedics office. Patient has history of severe  osteoarthritis and she had bilateral knee replacements and still have some hip pain. Patient right-sided weakness got worse and now has a right hand contracture.  No Data Recorded Assessment / Plan / Recommendation CHL IP CLINICAL IMPRESSIONS 05/31/2017 Clinical Impression Pt is s/p ACDF with noted pharyngeal swelling around C6 and soft cervical collar in place. Pt presents with mild pharyngeal deficits resulting in mild to moderate residue in vallecula and pyriform sinuses which is cleared with voluntary multiple swallows. With trials of thin liquids via straw pt with 1 episode of sensed mild frank penetration which was cleared during swallow. There was one episode of throat clearing (mild) in response to residue from puree. Cued mutliple swallows and liquid wash helped effectively clear puree residue. Pt consumed diced peaches with functional ability. When given whole pill whole in appleasuae, pt with difficulty clearing pill at the hieght of the larygneal vestibule and needed cues for multiple swallows. Pt independently performs multiple swallows/ double swallows throughout consumption therefor I recommend dysphagia 2 with thin liquids and medicine crushed in applesauce. Intermittent supervision recommended to remind pt of mutliple swallows.  SLP Visit Diagnosis Dysphagia, pharyngeal phase (R13.13) Attention and concentration deficit following -- Frontal lobe and executive function deficit following -- Impact on safety and function Mild aspiration risk   CHL IP TREATMENT RECOMMENDATION 05/31/2017 Treatment Recommendations Therapy as outlined in treatment plan below   No flowsheet data found. CHL IP DIET RECOMMENDATION 05/31/2017 SLP Diet Recommendations Dysphagia 2 (Fine chop) solids;Thin liquid Liquid Administration via Spoon;Cup;Straw Medication Administration Crushed with puree Compensations Small sips/bites;Follow solids with liquid;Other (Comment) Postural Changes Remain semi-upright after after feeds/meals  (Comment);Seated upright at 90 degrees  CHL IP OTHER RECOMMENDATIONS 05/31/2017 Recommended Consults -- Oral Care Recommendations Oral care BID Other Recommendations --   CHL IP FOLLOW UP RECOMMENDATIONS 05/22/2017 Follow up Recommendations 24 hour supervision/assistance   No flowsheet data found.     CHL IP ORAL PHASE 05/31/2017 Oral Phase WFL Oral - Pudding Teaspoon -- Oral - Pudding Cup -- Oral - Honey Teaspoon -- Oral - Honey Cup -- Oral - Nectar Teaspoon -- Oral - Nectar Cup -- Oral - Nectar Straw -- Oral - Thin Teaspoon -- Oral - Thin Cup -- Oral - Thin Straw -- Oral - Puree -- Oral - Mech Soft -- Oral - Regular -- Oral - Multi-Consistency -- Oral - Pill -- Oral Phase - Comment --  CHL IP PHARYNGEAL PHASE 05/31/2017 Pharyngeal Phase Impaired Pharyngeal- Pudding Teaspoon -- Pharyngeal -- Pharyngeal- Pudding Cup -- Pharyngeal -- Pharyngeal- Honey Teaspoon -- Pharyngeal -- Pharyngeal- Honey Cup -- Pharyngeal -- Pharyngeal- Nectar Teaspoon Pharyngeal residue - valleculae;Pharyngeal residue - pyriform;Other (Comment) Pharyngeal -- Pharyngeal- Nectar Cup Pharyngeal residue - valleculae;Pharyngeal residue - pyriform Pharyngeal -- Pharyngeal- Nectar Straw -- Pharyngeal -- Pharyngeal- Thin Teaspoon Pharyngeal residue - valleculae;Pharyngeal residue - pyriform;Other (Comment) Pharyngeal -- Pharyngeal- Thin Cup Pharyngeal residue - valleculae;Pharyngeal residue - pyriform Pharyngeal -- Pharyngeal- Thin Straw Other (Comment);Pharyngeal residue - valleculae;Pharyngeal residue - pyriform Pharyngeal -- Pharyngeal- Puree Pharyngeal residue - valleculae;Pharyngeal residue - pyriform;Other (Comment) Pharyngeal -- Pharyngeal- Mechanical Soft WFL Pharyngeal -- Pharyngeal- Regular -- Pharyngeal -- Pharyngeal- Multi-consistency WFL Pharyngeal -- Pharyngeal- Pill Pharyngeal residue - valleculae;Compensatory strategies attempted (with notebox) Pharyngeal -- Pharyngeal Comment --  CHL IP CERVICAL ESOPHAGEAL PHASE 05/31/2017 Cervical  Esophageal Phase WFL Pudding Teaspoon -- Pudding Cup -- Honey Teaspoon -- Honey Cup -- Nectar Teaspoon -- Nectar Cup -- Nectar Straw -- Thin Teaspoon -- Thin Cup -- Thin Straw -- Puree -- Mechanical Soft -- Regular -- Multi-consistency -- Pill -- Cervical Esophageal Comment -- CHL IP GO 05/22/2017 Functional Assessment Tool Used NOMS; clinical judgment Functional Limitations Memory Swallow Current Status [phone]) (None) Swallow Goal Status (W8088) (None) Swallow Discharge Status (P1031) (None) Motor Speech Current Status (R9458) (None) Motor Speech Goal Status (P9292) (None) Motor Speech Goal Status (K4628) (None) Spoken Language Comprehension Current Status (M3817) (None) Spoken Language Comprehension Goal Status (R1165) (None) Spoken Language Comprehension Discharge Status [phone]) (None) Spoken Language Expression Current Status [phone]) (None) Spoken Language Expression Goal Status (A9191) (None) Spoken Language Expression Discharge Status [phone]) (None) Attention Current Status (O4599) (None) Attention Goal Status (H7414) (None) Attention Discharge Status (E3953) (None) Memory Current Status (U0233) CI Memory Goal Status (I3568) CI Memory Discharge Status (G9170) CI Voice Current Status (S1683) (None) Voice Goal Status (F2902) (None) Voice Discharge Status (X1155) (None) Other Speech-Language Pathology Functional Limitation Current Status (M0802) (None) Other Speech-Language Pathology Functional Limitation Goal Status (M3361) (None) Other Speech-Language Pathology Functional Limitation Discharge Status [phone]) (None) Happi Overton 05/31/2017, 1:09 PM              Results for orders placed or performed during the hospital encounter of 05/26/17 (from the past 72 hour(s))  Basic metabolic panel     Status: Abnormal   Collection Time: 06/01/17  5:02 AM  Result Value Ref Range   Sodium 136 135 - 145 mmol/L   Potassium 3.9 3.5 - 5.1 mmol/L   Chloride 101 101 - 111 mmol/L   CO2 28 22 - 32 mmol/L   Glucose, Bld  92 65 - 99 mg/dL   BUN 22 (H) 6 - 20 mg/dL   Creatinine,  Ser 1.07 (H) 0.44 - 1.00 mg/dL   Calcium 8.6 (L) 8.9 - 10.3 mg/dL   GFR calc non Af Amer 45 (L) >60 mL/min   GFR calc Af Amer 53 (L) >60 mL/min    Comment: (NOTE) The eGFR has been calculated using the CKD EPI equation. This calculation has not been validated in all clinical situations. eGFR's persistently <60 mL/min signify possible Chronic Kidney Disease.    Anion gap 7 5 - 15  CBC     Status: Abnormal   Collection Time: 06/01/17  5:02 AM  Result Value Ref Range   WBC 5.6 4.0 - 10.5 K/uL   RBC 3.63 (L) 3.87 - 5.11 MIL/uL   Hemoglobin 10.8 (L) 12.0 - 15.0 g/dL   HCT 33.2 (L) 36.0 - 46.0 %   MCV 91.5 78.0 - 100.0 fL   MCH 29.8 26.0 - 34.0 pg   MCHC 32.5 30.0 - 36.0 g/dL   RDW 14.5 11.5 - 15.5 %   Platelets 198 150 - 400 K/uL     HEENT: Incision c/d/i. Normocephalic, atraumatic. Neck: Soft collar in place Cardio: RRR and no JVD. Resp: CTA B/L and unlabored GI: BS positive and ND Skin:   Wound C/D/I and IV site clean , no erythema, ecchymosis in upper chest area Neuro: Alert/Oriented Motor 4/5 Right delt, bi, tri, grip, 3/5 right  HF, KE, 4/5 ADF (stable) 5/5 LUE LLE: 4/5 proximal to distal Musc/Skel:  No edema, no tenderness Gen NAD. Well-developed. Vital signs reviewed.   Assessment/Plan: 1. Functional deficits secondary to cervical myelopathy  which require 3+ hours per day of interdisciplinary therapy in a comprehensive inpatient rehab setting. Physiatrist is providing close team supervision and 24 hour management of active medical problems listed below. Physiatrist and rehab team continue to assess barriers to discharge/monitor patient progress toward functional and medical goals. FIM: Function - Bathing Bathing activity did not occur: N/A Position: Shower Body parts bathed by patient: Right arm, Chest, Abdomen, Front perineal area, Right upper leg, Left upper leg, Left arm, Right lower leg, Left lower  leg Body parts bathed by helper: Back, Buttocks Assist Level: Touching or steadying assistance(Pt > 75%)  Function- Upper Body Dressing/Undressing Upper body dressing/undressing activity did not occur: N/A What is the patient wearing?: Pull over shirt/dress Pull over shirt/dress - Perfomed by patient: Thread/unthread right sleeve, Put head through opening Pull over shirt/dress - Perfomed by helper: Pull shirt over trunk Button up shirt - Perfomed by patient: Thread/unthread left sleeve Button up shirt - Perfomed by helper: Thread/unthread right sleeve, Pull shirt around back, Button/unbutton shirt Assist Level: Touching or steadying assistance(Pt > 75%) Function - Lower Body Dressing/Undressing Lower body dressing/undressing activity did not occur: N/A What is the patient wearing?: Pants, Non-skid slipper socks Position: Wheelchair/chair at sink Pants- Performed by patient: Thread/unthread right pants leg, Thread/unthread left pants leg, Pull pants up/down(Using reacher) Pants- Performed by helper: Thread/unthread right pants leg, Thread/unthread left pants leg, Pull pants up/down Non-skid slipper socks- Performed by helper: Don/doff right sock, Don/doff left sock Assist for footwear: Maximal assist Assist for lower body dressing: Touching or steadying assistance (Pt > 75%)  Function - Toileting Toileting activity did not occur: N/A Toileting steps completed by patient: Performs perineal hygiene Toileting steps completed by helper: Adjust clothing prior to toileting, Adjust clothing after toileting Toileting Assistive Devices: Grab bar or rail Assist level: More than reasonable time  Function - Air cabin crew transfer activity did not occur: N/A Toilet transfer assistive device: Elita Quick  bar, Elevated toilet seat/BSC over toilet Assist level to toilet: Supervision or verbal cues Assist level from toilet: Supervision or verbal cues  Function - Chair/bed  transfer Chair/bed transfer activity did not occur: N/A Chair/bed transfer method: Ambulatory Chair/bed transfer assist level: Supervision or verbal cues Chair/bed transfer assistive device: Armrests, Walker Chair/bed transfer details: Verbal cues for precautions/safety  Function - Locomotion: Wheelchair Type: Manual Max wheelchair distance: 60f  Assist Level: Moderate assistance (Pt 50 - 74%) Assist Level: Moderate assistance (Pt 50 - 74%) Wheel 150 feet activity did not occur: Safety/medical concerns Function - Locomotion: Ambulation Assistive device: Walker-rolling Max distance: 108 Assist level: Touching or steadying assistance (Pt > 75%) Assist level: Touching or steadying assistance (Pt > 75%) Assist level: Touching or steadying assistance (Pt > 75%) Walk 150 feet activity did not occur: Safety/medical concerns Walk 10 feet on uneven surfaces activity did not occur: Safety/medical concerns Assist level: Touching or steadying assistance (Pt > 75%)  Function - Comprehension Comprehension: Auditory Comprehension assist level: Follows complex conversation/direction with extra time/assistive device  Function - Expression Expression: Verbal Expression assist level: Expresses complex ideas: With extra time/assistive device  Function - Social Interaction Social Interaction assist level: Interacts appropriately with others - No medications needed.  Function - Problem Solving Problem solving assist level: Solves basic problems with no assist  Function - Memory Memory assist level: Recognizes or recalls 90% of the time/requires cueing < 10% of the time Patient normally able to recall (first 3 days only): Current season, That he or she is in a hospital, Location of own room, Staff names and faces  Medical Problem List and Plan: 1.  Gait abnormality and limitations in self-care secondary to cervical myelopathy.   Cont CIR PT, OT, SLP 2.  DVT Prophylaxis/Anticoagulation:  Mechanical: Sequential compression devices, below knee Bilateral lower extremities, non pharmacologic due to cervical surgery 3. Pain Management: Will continue oxycodone prn. Add voltaren gel additionally with ice/heat for local measures.  4. Mood: LCSW to follow for evaluation and support.  5. Neuropsych: This patient is capable of making decisions on her own behalf. 6. Skin/Wound Care: monitor wound for healing. Add nutritional supplement to promote healing 7. Fluids/Electrolytes/Nutrition: Monitor I/O. Check lytes in am.  8. A fib: Monitor heart rate bid--on amiodarone at bedtime with toprol XL bid. Xarelto on hold, HR controlled 11/20 Vitals:   05/31/17 1559 06/01/17 0617  BP: (!) 109/51 (!) 146/64  Pulse: 76 64  Resp: 18 16  Temp: 97.8 F (36.6 C) 98 F (36.7 C)  SpO2: 96% 97%   9. HTN: Monitor BP bid. Continue norvasc and metoprolol,   labile will monitor for trend 10 CKD: Encourage fluid intake. Monitor renal status   Cr 1.08 on 11/15   Cont to monitor 11. RLS: Managed with requip at nights and xanax prn 12. GERD: Continue protonix. 13. Seasonal allergies: On claritin and flonase--uses albuterol prn for wheezing.  14.  Chronic LBP with radiculopathy: Unable to tolerated gabapentin or tramadol. Will continue to use local measures .Oxy IR more helpful than tramadol but causing some drowsiness cont to monitor 14. Chronic constipation: Uses suppository daily. Added miralax due to reports of hard stools and now on narcotics.    Increased bowel reg again on 11/17   Improving 15. Chronic mild hyponatremia: Encourage intake.    B met normal on 06/01/2017 16.  Dysphagia due to swelling at operative site should gradually improve   Cont to monitor 17. Hemorrhoids  Tucks pads ordered 11/17 No  significant bleeding, hemoglobin stable LOS (Days) 6 A FACE TO FACE EVALUATION WAS PERFORMED  Charlett Blake 06/01/2017, 9:45 AM

## 2017-06-01 NOTE — Progress Notes (Signed)
Speech Language Pathology Daily Session Note  Patient Details  Name: RIVKA BAUNE MRN: [id number] Date of Birth: [birth year]  Today's Date: 06/01/2017 SLP Individual Time: 4996-9249; 3241-9914 SLP Individual Time Calculation (min): 25 min; 30 min   Short Term Goals: Week 1: SLP Short Term Goal 1 (Week 1): Pt will consume dysphagia 2 with thin liquids with minimal overt s/s of aspiration.  SLP Short Term Goal 2 (Week 1): Pt will complete Modified Barium Swallow Study to determine safest diet.  SLP Short Term Goal 2 - Progress (Week 1): Met  Skilled Therapeutic Interventions:  Session 1:  Pt was seen for skilled ST targeting dysphagia goals.  Pt consumed a functional snack of dys 2 textures with mod I use of swallowing precautions and without overt s/s of aspiration.  Pt was left in recliner with call bell within reach.  Continue per current plan of care.   Session 2: Pt was seen for skilled ST targeting dysphagia goals.  Pt consumed dys 2 textures and thin liquids on her lunch tray with intermittent coughing following mixed solid (meat specifically) and liquid consistencies which pt mitigated with self cued use of volitional swallows.  Pt was left in recliner with call bell within reach.  Continue per current plan of care.    Function:  Eating Eating   Modified Consistency Diet: Yes Eating Assist Level: Set up assist for;Swallowing techniques: self managed   Eating Set Up Assist For: Opening containers       Cognition Comprehension Comprehension assist level: Follows complex conversation/direction with extra time/assistive device  Expression   Expression assist level: Expresses complex ideas: With extra time/assistive device  Social Interaction Social Interaction assist level: Interacts appropriately with others with medication or extra time (anti-anxiety, antidepressant).  Problem Solving Problem solving assist level: Solves basic problems with no assist  Memory Memory assist  level: More than reasonable amount of time    Pain Pain Assessment Pain Assessment: No/denies pain  Therapy/Group: Individual Therapy  Leonie Amacher, Selinda Orion 06/01/2017, 12:54 PM

## 2017-06-01 NOTE — Evaluation (Signed)
Recreational Therapy Assessment and Plan  Patient Details  Name: [name] MRN: [id number] Date of Birth: 05/17/1928 Today's Date: 06/01/2017  Rehab Potential: Good ELOS: 2 weeks   Assessment  Problem List:      Patient Active Problem List   Diagnosis Date Noted  . Chronic constipation   . Gastroesophageal reflux disease   . RLS (restless legs syndrome)   . DDD (degenerative disc disease), cervical   . Post-operative pain   . Chronic a-fib ([place])   . Hyponatremia   . Cervical stenosis of spine 05/24/2017  . Cervical myelopathy ([place]) 05/24/2017  . AF (paroxysmal atrial fibrillation) ([place]) 05/24/2017  . Chronic diastolic CHF (congestive heart failure) ([place]) 05/24/2017  . CKD (chronic kidney disease) stage 3, GFR 30-59 ml/min ([facility]) 05/24/2017  . Essential hypertension 05/24/2017  . Right sided weakness 05/22/2017  . Neuropathic pain 05/26/2013  . Restless leg syndrome 05/26/2013  . OA (osteoarthritis) of knee 11/07/2012    Past Medical History:      Past Medical History:  Diagnosis Date  . Arthritis   . Chronic kidney disease   . Depression   . Dysrhythmia 8/13   palpitations/ now resolved  . GERD (gastroesophageal reflux disease)   . History of blood transfusion 2012  . Hypertension   . Peripheral vascular disease ([facility])    varicose veins bilaterally   . Restless legs syndrome    Past Surgical History:       Past Surgical History:  Procedure Laterality Date  . ABDOMINAL HYSTERECTOMY    . ANTERIOR CERVICAL DECOMP/DISCECTOMY FUSION N/A 05/24/2017   Procedure: ANTERIOR CERVICAL DECOMPRESSION/DISCECTOMY FUSION CERVICAL THREE-FOUR, CERVICAL FIVE-SIX, CERVICAL SIX-SEVEN;  Surgeon: Earnie Larsson, MD;  Location: [facility];  Service: Neurosurgery;  Laterality: N/A;  . APPENDECTOMY    . EYE SURGERY Bilateral    cataract extraction with IOL  . JOINT REPLACEMENT Right    knee  . SEPTOPLASTY     repair deviated septum  . TOTAL KNEE  ARTHROPLASTY Left 11/07/2012   Procedure: LEFT TOTAL KNEE ARTHROPLASTY;  Surgeon: Gearlean Alf, MD;  Location: [facility];  Service: Orthopedics;  Laterality: Left;  Marland Kitchen VARICOSE VEIN SURGERY Bilateral     Assessment & Plan Clinical Impression: Patient is a 83 y.o.femalewith history of HTN, CKD, PAF- on Xarelto and amiodarone , DDD with chronic back pain who was admitted on 05/22/17 with 2 week history of progressive right sided weakness and numbness with loss of use of right hand, right foot drop and loss of dexterity left hand.History taken from chart review and daughter.MRI brainreviewed, unremarkable for acute process. Per report,without acute infarct and MRI spine done revealing severe cervical spondylosis with diffuse canal stenosis C3/4, C5/6 and C6/7, severe cord compression C3/4, R>L reactive edema due to facet arthritis most prevalent at C5/6, multilevel compressive disc bulge throughout thoracic spine and multilevel change lumbar spine L2-L5 with severe spinal stenosis L4/5. She was evaluated by Dr. Annette Stable who recommended cervical decompression due to myelopathy and she was cleared for surgery by Dr. Caryl Comes. She underwent cervical decompression C3- C7 with ACDF on 05/24/17. Patient transferred to CIR on 05/26/2017 .   Pt presents with decreased activity tolerance, decreased functional mobility, decreased balance, decreased coordination, and difficulty maintaining precautions Limiting pt's independence with leisure/community pursuits.  Leisure History/Participation Premorbid leisure interest/current participation: [name] care;Nature - Vegetable gardening;Community - Public house manager Interests: Music (Comment) Other Leisure Interests: Cooking/Baking;Housework Leisure Participation Style: With Family/Friends Awareness of Community Resources: Excellent Psychosocial / Spiritual  Spiritual Interests: Church;Womens'[employer]'s Groups Patient agreeable to Pet Therapy: Yes Does  patient have pets?: No Social interaction - Mood/Behavior: Cooperative Engineer, drilling for Education?: Yes Patient Agreeable to Outing?: Yes Recreational Therapy Orientation Orientation -Reviewed with patient: Available activity resources Strengths/Weaknesses Patient Strengths/Abilities: Willingness to participate;Active premorbidly Patient weaknesses: Physical limitations TR Patient demonstrates impairments in the following area(s): Endurance;Motor;Safety  Plan Rec Therapy Plan Is patient appropriate for Therapeutic Recreation?: Yes Rehab Potential: Good Treatment times per week: Min 1 TR session/group >25 minutes during LOS Estimated Length of Stay: 2 weeks TR Treatment/Interventions: Adaptive equipment instruction;Cognitive remediation/compensation;Group participation (Comment);Provide activity resources in room;Therapeutic exercise;1:1 session;Community reintegration;Recreation/leisure participation;UE/LE Coordination activities;Balance/vestibular training;Functional mobility training;Patient/family education;Therapeutic activities  Recommendations for other services: None   Discharge Criteria: Patient will be discharged from TR if patient refuses treatment 3 consecutive times without medical reason.  If treatment goals not met, if there is a change in medical status, if patient makes no progress towards goals or if patient is discharged from hospital.  The above assessment, treatment plan, treatment alternatives and goals were discussed and mutually agreed upon: by patient  [place] 06/01/2017, 3:42 PM

## 2017-06-02 ENCOUNTER — Inpatient Hospital Stay (HOSPITAL_COMMUNITY): Payer: Medicare Other | Admitting: Physical Therapy

## 2017-06-02 ENCOUNTER — Inpatient Hospital Stay (HOSPITAL_COMMUNITY): Payer: Medicare Other | Admitting: Occupational Therapy

## 2017-06-02 DIAGNOSIS — M5441 Lumbago with sciatica, right side: Secondary | ICD-10-CM

## 2017-06-02 NOTE — Progress Notes (Signed)
Social Work Patient ID: [name], female   DOB: 13-Apr-1928, 83 y.o.   MRN: [id number]  Met with pt and granddaughter to discuss team conference goals mod/i-supervision level and target discharge date 11/26. Both pleased with her progress and will be ready to go on Monday. Has all equipment and wants same home health is can remember who that was.

## 2017-06-02 NOTE — Progress Notes (Signed)
Occupational Therapy Session Note  Patient Details  Name: [name] MRN: [id number] Date of Birth: 12/09/1927  Today's Date: 06/02/2017 OT Individual Time: 1530-1600 OT Individual Time Calculation (min): 30 min    Short Term Goals: Week 1:  OT Short Term Goal 1 (Week 1): Pt will complete 3/3 toileting tasks with steadying assist OT Short Term Goal 2 (Week 1): Pt will complete grooming task standing at sink with CGA in order to increase functional standing balance/endurance OT Short Term Goal 3 (Week 1): Pt will correctly use AE PRN during dressing tasks independently  Skilled Therapeutic Interventions/Progress Updates:    Upon entering the room, pt seated in recliner chair with family present in room. Pt with no c/o pain but reports feeling fatigued from prior sessions. Pt ambulating with RW to bathroom with min A for balance. Pt performed toilet transfer with supervision but requires steady assistance for balance with clothing management and hygiene after voiding. Pt returning to stand at sink to wash hands without UE support and standing with close supervision. Pt returning to recliner chair at the end of session and given education regarding home safety, discharge recommendations, and expectations for HHOT. Pt asking questions as appropriate and remaining seated with call bell and all needed items within reach upon exiting the room.   Therapy Documentation Precautions:  Precautions Precautions: Fall, Cervical Required Braces or Orthoses: Cervical Brace Cervical Brace: Soft collar, At all times Restrictions Weight Bearing Restrictions: No General:   Vital Signs: Therapy Vitals Temp: 98 F (36.7 C) Temp Source: Oral Pulse Rate: 81 Resp: 18 BP: 127/78 Patient Position (if appropriate): Sitting Oxygen Therapy SpO2: 99 % O2 Device: Not Delivered Pain:   ADL:   Vision   Perception    Praxis   Exercises:   Other Treatments:    See Function Navigator for Current  Functional Status.   Therapy/Group: Individual Therapy  Alen BleacherBradsher, Patton Swisher P 06/02/2017, 4:18 PM

## 2017-06-02 NOTE — Progress Notes (Signed)
Physical Therapy Session Note  Patient Details  Name: [name] MRN: [id number] Date of Birth: Dec 06, 1927  Today's Date: 06/02/2017 PT Individual Time: 1301-1400 PT Individual Time Calculation (min): 59 min    Skilled Therapeutic Interventions/Progress Updates:    Session initiated with pt seated up in recliner chair.  Pt denied pain.  Initial BP:  157/85.  HR: 80 bpm.  Pt utilized soft collar t/o session.  Pt ambulated with CGA and with and without t-band around R LE to facilitate improved R heel strike and initial contact and to decrease R knee hyperextension in midstance.  Ambulation bouts were 1 x 150 ft. 1 x 70 ft and 1 x 10 ft.  Pt responds best to external cues to think of "kicking leg out in front and then pulling back through leg" after performing w/c propulsion using B LE.  When performing w/c propulsion and using mainly R LE, pt requires significant cueing to reach out through R heel and really pull back towards the chair to increase hamstring activation.  Pt additionally performed mini squats with light UE support via RW to work on improving R knee control during function.  Following session, pt returned to recliner chair with call bell in reach and needs met.  No pain c/o during session.  Therapy Documentation Precautions:  Precautions Precautions: Fall, Cervical Required Braces or Orthoses: Cervical Brace Cervical Brace: Soft collar, At all times Restrictions Weight Bearing Restrictions: No     See Function Navigator for Current Functional Status.   Therapy/Group: Individual Therapy  Owin Vignola Hilario Quarry 06/02/2017, 3:39 PM

## 2017-06-02 NOTE — Progress Notes (Signed)
Physical Therapy Session Note  Patient Details  Name: [name] MRN: [id number] Date of Birth: 1927-12-31  Today's Date: 06/02/2017 PT Individual Time: 0910-1009 PT Individual Time Calculation (min): 59 min   Short Term Goals: Week 1:  PT Short Term Goal 1 (Week 1): Pt will perform sit<>stand with min assist and LRAD  PT Short Term Goal 2 (Week 1): Pt will perform bed mobility with min assist  PT Short Term Goal 3 (Week 1): Pt will ambulate 171f with min assist and LRAD  PT Short Term Goal 4 (Week 1): Pt will perform stand pivot transfer to and [facility] assist consistant with LRAD  Skilled Therapeutic Interventions/Progress Updates:   Pt in recliner upon arrival and agreeable to therapy, no c/o pain. Total A to change from socks to shoes for gait. Ambulated 125' and 100' w/ Min guard using RW. Pt able to verbalize cues she needed for gait pattern without prompting, cues to increase foot clearance, heel-to-toe pattern, and increased DF control. More prominent toe drag towards end of 2nd gait bout 2/2 fatigue. Seated rest break on mat in gym 2/2 fatigue. Worked on standing balance and standing tolerance w/ unilateral support on RW while in gym. Pt performed RUE reaching tasks w/ verbal and visual cues for grip for object manipulation. Verbal cues to engage in hip strategies. Total A w/c transport to day room and continued to work on standing balance on biodex w/ moderate manual facilitation for hip balance strategies, verbal cues to engage ankle balance strategies. Pt self-propelled w/c back towards room w/ Min A for 50' using BUE/LEs to work on endurance, Total A remaining distance 2/2 fatigue. Ended session in recliner and in care of family, all needs met and call bell within reach.   Therapy Documentation Precautions:  Precautions Precautions: Fall, Cervical Required Braces or Orthoses: Cervical Brace Cervical Brace: Soft collar, At all times Restrictions Weight Bearing  Restrictions: No Pain: Pain Assessment Pain Assessment: No/denies pain  See Function Navigator for Current Functional Status.   Therapy/Group: Individual Therapy  Tacara Hadlock K Arnette 06/02/2017, 10:11 AM

## 2017-06-02 NOTE — Progress Notes (Addendum)
Subjective/Complaints:  Had some difficulties sleepin last night. Just couldn't get comfortable  ROS: pt denies nausea, vomiting, diarrhea, cough, shortness of breath or chest pain    Objective: Vital Signs: Blood pressure (!) 147/66, pulse 61, temperature 97.9 F (36.6 C), temperature source Oral, resp. rate 18, height _0  (1.702 m), weight 81.1 kg (178 lb 12.7 oz), SpO2 98 %. Dg Swallowing Func-speech Pathology  Result Date: 05/31/2017 Objective Swallowing Evaluation: Type of Study: MBS-Modified Barium Swallow Study  Patient Details Name: [name] MRN: [id number] Date of Birth: 02/19/1928 Today's Date: 05/31/2017 Time: SLP Start Time (ACUTE ONLY): 1001 -SLP Stop Time (ACUTE ONLY): 1030 SLP Time Calculation (min) (ACUTE ONLY): 29 min Past Medical History: Past Medical History: Diagnosis Date . Arthritis  . Chronic kidney disease  . Depression  . Dysrhythmia 8/13  palpitations/ now resolved . GERD (gastroesophageal reflux disease)  . History of blood transfusion 2012 . Hypertension  . Peripheral vascular disease (HCC)   varicose veins bilaterally  . Restless legs syndrome  Past Surgical History: Past Surgical History: Procedure Laterality Date . ABDOMINAL HYSTERECTOMY   . ANTERIOR CERVICAL DECOMPRESSION/DISCECTOMY FUSION CERVICAL THREE-FOUR, CERVICAL FIVE-SIX, CERVICAL SIX-SEVEN N/A 05/24/2017  Performed by Earnie Larsson, MD at [place]   . EYE SURGERY Bilateral   cataract extraction with IOL . JOINT REPLACEMENT Right   knee . LEFT TOTAL KNEE ARTHROPLASTY Left 11/07/2012  Performed by Gearlean Alf, MD at [facility] ORS . SEPTOPLASTY    repair deviated septum . VARICOSE VEIN SURGERY Bilateral  HPI: 83 y.o. female, with past medical history significant for chronic kidney disease , osteoarthritis , arthritis of the spine  and atrial fibrillation on Eliquis presenting with 2 weeks history of right-sided numbness and weakness that the patient reports it got worse after his steroid spinal  injection done at the orthopedics office. Patient has history of severe osteoarthritis and she had bilateral knee replacements and still have some hip pain. Patient right-sided weakness got worse and now has a right hand contracture.  No Data Recorded Assessment / Plan / Recommendation CHL IP CLINICAL IMPRESSIONS 05/31/2017 Clinical Impression Pt is s/p ACDF with noted pharyngeal swelling around C6 and soft cervical collar in place. Pt presents with mild pharyngeal deficits resulting in mild to moderate residue in vallecula and pyriform sinuses which is cleared with voluntary multiple swallows. With trials of thin liquids via straw pt with 1 episode of sensed mild frank penetration which was cleared during swallow. There was one episode of throat clearing (mild) in response to residue from puree. Cued mutliple swallows and liquid wash helped effectively clear puree residue. Pt consumed diced peaches with functional ability. When given whole pill whole in appleasuae, pt with difficulty clearing pill at the hieght of the larygneal vestibule and needed cues for multiple swallows. Pt independently performs multiple swallows/ double swallows throughout consumption therefor I recommend dysphagia 2 with thin liquids and medicine crushed in applesauce. Intermittent supervision recommended to remind pt of mutliple swallows.  SLP Visit Diagnosis Dysphagia, pharyngeal phase (R13.13) Attention and concentration deficit following -- Frontal lobe and executive function deficit following -- Impact on safety and function Mild aspiration risk   CHL IP TREATMENT RECOMMENDATION 05/31/2017 Treatment Recommendations Therapy as outlined in treatment plan below   No flowsheet data found. CHL IP DIET RECOMMENDATION 05/31/2017 SLP Diet Recommendations Dysphagia 2 (Fine chop) solids;Thin liquid Liquid Administration via Spoon;Cup;Straw Medication Administration Crushed with puree Compensations Small sips/bites;Follow solids with liquid;Other  (Comment) Postural Changes Remain semi-upright  after after feeds/meals (Comment);Seated upright at 90 degrees   CHL IP OTHER RECOMMENDATIONS 05/31/2017 Recommended Consults -- Oral Care Recommendations Oral care BID Other Recommendations --   CHL IP FOLLOW UP RECOMMENDATIONS 05/22/2017 Follow up Recommendations 24 hour supervision/assistance   No flowsheet data found.     CHL IP ORAL PHASE 05/31/2017 Oral Phase WFL Oral - Pudding Teaspoon -- Oral - Pudding Cup -- Oral - Honey Teaspoon -- Oral - Honey Cup -- Oral - Nectar Teaspoon -- Oral - Nectar Cup -- Oral - Nectar Straw -- Oral - Thin Teaspoon -- Oral - Thin Cup -- Oral - Thin Straw -- Oral - Puree -- Oral - Mech Soft -- Oral - Regular -- Oral - Multi-Consistency -- Oral - Pill -- Oral Phase - Comment --  CHL IP PHARYNGEAL PHASE 05/31/2017 Pharyngeal Phase Impaired Pharyngeal- Pudding Teaspoon -- Pharyngeal -- Pharyngeal- Pudding Cup -- Pharyngeal -- Pharyngeal- Honey Teaspoon -- Pharyngeal -- Pharyngeal- Honey Cup -- Pharyngeal -- Pharyngeal- Nectar Teaspoon Pharyngeal residue - valleculae;Pharyngeal residue - pyriform;Other (Comment) Pharyngeal -- Pharyngeal- Nectar Cup Pharyngeal residue - valleculae;Pharyngeal residue - pyriform Pharyngeal -- Pharyngeal- Nectar Straw -- Pharyngeal -- Pharyngeal- Thin Teaspoon Pharyngeal residue - valleculae;Pharyngeal residue - pyriform;Other (Comment) Pharyngeal -- Pharyngeal- Thin Cup Pharyngeal residue - valleculae;Pharyngeal residue - pyriform Pharyngeal -- Pharyngeal- Thin Straw Other (Comment);Pharyngeal residue - valleculae;Pharyngeal residue - pyriform Pharyngeal -- Pharyngeal- Puree Pharyngeal residue - valleculae;Pharyngeal residue - pyriform;Other (Comment) Pharyngeal -- Pharyngeal- Mechanical Soft WFL Pharyngeal -- Pharyngeal- Regular -- Pharyngeal -- Pharyngeal- Multi-consistency WFL Pharyngeal -- Pharyngeal- Pill Pharyngeal residue - valleculae;Compensatory strategies attempted (with notebox) Pharyngeal --  Pharyngeal Comment --  CHL IP CERVICAL ESOPHAGEAL PHASE 05/31/2017 Cervical Esophageal Phase WFL Pudding Teaspoon -- Pudding Cup -- Honey Teaspoon -- Honey Cup -- Nectar Teaspoon -- Nectar Cup -- Nectar Straw -- Thin Teaspoon -- Thin Cup -- Thin Straw -- Puree -- Mechanical Soft -- Regular -- Multi-consistency -- Pill -- Cervical Esophageal Comment -- CHL IP GO 05/22/2017 Functional Assessment Tool Used NOMS; clinical judgment Functional Limitations Memory Swallow Current Status [phone]) (None) Swallow Goal Status (O7703) (None) Swallow Discharge Status (E0352) (None) Motor Speech Current Status (Y8185) (None) Motor Speech Goal Status (T0931) (None) Motor Speech Goal Status (P2162) (None) Spoken Language Comprehension Current Status (O4695) (None) Spoken Language Comprehension Goal Status (Q7225) (None) Spoken Language Comprehension Discharge Status [phone]) (None) Spoken Language Expression Current Status [phone]) (None) Spoken Language Expression Goal Status (I5189) (None) Spoken Language Expression Discharge Status [phone]) (None) Attention Current Status (Z1281) (None) Attention Goal Status (V8867) (None) Attention Discharge Status (R3736) (None) Memory Current Status (K8159) CI Memory Goal Status (E7076) CI Memory Discharge Status (G9170) CI Voice Current Status (J5183) (None) Voice Goal Status (U3735) (None) Voice Discharge Status (D8978) (None) Other Speech-Language Pathology Functional Limitation Current Status (E7841) (None) Other Speech-Language Pathology Functional Limitation Goal Status (Q8208) (None) Other Speech-Language Pathology Functional Limitation Discharge Status [phone]) (None) Happi Overton 05/31/2017, 1:09 PM              Results for orders placed or performed during the hospital encounter of 05/26/17 (from the past 72 hour(s))  Basic metabolic panel     Status: Abnormal   Collection Time: 06/01/17  5:02 AM  Result Value Ref Range   Sodium 136 135 - 145 mmol/L   Potassium 3.9 3.5 - 5.1  mmol/L   Chloride 101 101 - 111 mmol/L   CO2 28 22 - 32 mmol/L   Glucose, Bld 92 65 - 99 mg/dL  BUN 22 (H) 6 - 20 mg/dL   Creatinine, Ser 1.07 (H) 0.44 - 1.00 mg/dL   Calcium 8.6 (L) 8.9 - 10.3 mg/dL   GFR calc non Af Amer 45 (L) >60 mL/min   GFR calc Af Amer 53 (L) >60 mL/min    Comment: (NOTE) The eGFR has been calculated using the CKD EPI equation. This calculation has not been validated in all clinical situations. eGFR's persistently <60 mL/min signify possible Chronic Kidney Disease.    Anion gap 7 5 - 15  CBC     Status: Abnormal   Collection Time: 06/01/17  5:02 AM  Result Value Ref Range   WBC 5.6 4.0 - 10.5 K/uL   RBC 3.63 (L) 3.87 - 5.11 MIL/uL   Hemoglobin 10.8 (L) 12.0 - 15.0 g/dL   HCT 33.2 (L) 36.0 - 46.0 %   MCV 91.5 78.0 - 100.0 fL   MCH 29.8 26.0 - 34.0 pg   MCHC 32.5 30.0 - 36.0 g/dL   RDW 14.5 11.5 - 15.5 %   Platelets 198 150 - 400 K/uL     HEENT: Incision c/d/i. Normocephalic, atraumatic. Neck: Soft collar in place Cardio: RRR without murmur. No JVD . Resp: CTA Bilaterally without wheezes or rales. Normal effort  GI: BS positive and ND Skin:   Wound C/D/I and IV site clean , no erythema, ecchymosis in upper chest area Neuro: Alert/Oriented Motor 4/5 Right delt, bi, tri, grip,--stable 3- to 3/5 right  HF, KE, 4/5 ADF --stable 5/5 LUE LLE: 4/5 proximal to distal -senses pain and light touch Musc/Skel:  No edema, no tenderness Gen NAD.   Assessment/Plan: 1. Functional deficits secondary to cervical myelopathy  which require 3+ hours per day of interdisciplinary therapy in a comprehensive inpatient rehab setting. Physiatrist is providing close team supervision and 24 hour management of active medical problems listed below. Physiatrist and rehab team continue to assess barriers to discharge/monitor patient progress toward functional and medical goals. FIM: Function - Bathing Bathing activity did not occur: N/A Position: Shower Body parts bathed  by patient: Right arm, Chest, Abdomen, Front perineal area, Right upper leg, Left upper leg, Left arm, Right lower leg, Left lower leg, Buttocks Body parts bathed by helper: Back Assist Level: Supervision or verbal cues  Function- Upper Body Dressing/Undressing Upper body dressing/undressing activity did not occur: N/A What is the patient wearing?: Pull over shirt/dress Pull over shirt/dress - Perfomed by patient: Thread/unthread right sleeve, Put head through opening, Thread/unthread left sleeve, Pull shirt over trunk Pull over shirt/dress - Perfomed by helper: Pull shirt over trunk Button up shirt - Perfomed by patient: Thread/unthread left sleeve Button up shirt - Perfomed by helper: Thread/unthread right sleeve, Pull shirt around back, Button/unbutton shirt Assist Level: Supervision or verbal cues Function - Lower Body Dressing/Undressing Lower body dressing/undressing activity did not occur: N/A What is the patient wearing?: Pants, Non-skid slipper socks Position: Other (comment)(Sitting on toilet) Pants- Performed by patient: Thread/unthread right pants leg, Thread/unthread left pants leg, Pull pants up/down Pants- Performed by helper: Thread/unthread right pants leg, Thread/unthread left pants leg, Pull pants up/down Non-skid slipper socks- Performed by helper: Don/doff right sock, Don/doff left sock Assist for footwear: Maximal assist Assist for lower body dressing: Touching or steadying assistance (Pt > 75%)  Function - Toileting Toileting activity did not occur: N/A Toileting steps completed by patient: Adjust clothing prior to toileting, Performs perineal hygiene, Adjust clothing after toileting Toileting steps completed by helper: Performs perineal hygiene Toileting Assistive Devices: Grab bar  or rail Assist level: Touching or steadying assistance (Pt.75%)  Function - Air cabin crew transfer activity did not occur: N/A Toilet transfer assistive device: Grab bar,  Walker Assist level to toilet: Supervision or verbal cues Assist level from toilet: Supervision or verbal cues  Function - Chair/bed transfer Chair/bed transfer activity did not occur: N/A Chair/bed transfer method: Ambulatory Chair/bed transfer assist level: Touching or steadying assistance (Pt > 75%) Chair/bed transfer assistive device: Armrests, Walker Chair/bed transfer details: Verbal cues for precautions/safety  Function - Locomotion: Wheelchair Type: Manual Max wheelchair distance: 24f  Assist Level: Moderate assistance (Pt 50 - 74%) Assist Level: Moderate assistance (Pt 50 - 74%) Wheel 150 feet activity did not occur: Safety/medical concerns Function - Locomotion: Ambulation Assistive device: Walker-rolling Max distance: 108 Assist level: Touching or steadying assistance (Pt > 75%) Assist level: Touching or steadying assistance (Pt > 75%) Assist level: Touching or steadying assistance (Pt > 75%) Walk 150 feet activity did not occur: Safety/medical concerns Walk 10 feet on uneven surfaces activity did not occur: Safety/medical concerns Assist level: Touching or steadying assistance (Pt > 75%)  Function - Comprehension Comprehension: Auditory Comprehension assist level: Follows complex conversation/direction with extra time/assistive device  Function - Expression Expression: Verbal Expression assist level: Expresses complex ideas: With extra time/assistive device  Function - Social Interaction Social Interaction assist level: Interacts appropriately with others with medication or extra time (anti-anxiety, antidepressant).  Function - Problem Solving Problem solving assist level: Solves basic problems with no assist  Function - Memory Memory assist level: More than reasonable amount of time Patient normally able to recall (first 3 days only): Current season, That he or she is in a hospital, Location of own room, Staff names and faces  Medical Problem List and  Plan: 1.  Gait abnormality and limitations in self-care secondary to cervical myelopathy.   Cont CIR PT, OT, SLP   -team conference today 2.  DVT Prophylaxis/Anticoagulation: Mechanical: Sequential compression devices, below knee Bilateral lower extremities, non pharmacologic due to cervical surgery 3. Pain Management: Will continue oxycodone prn. Add voltaren gel additionally with ice/heat for local measures.  4. Mood: LCSW to follow for evaluation and support.  5. Neuropsych: This patient is capable of making decisions on her own behalf. 6. Skin/Wound Care: monitor wound for healing. Add nutritional supplement to promote healing 7. Fluids/Electrolytes/Nutrition: Monitor I/O. Check lytes in am.  8. A fib: Monitor heart rate bid--on amiodarone at bedtime with toprol XL bid. Xarelto on hold, HR controlled 11/21 Vitals:   06/01/17 1500 06/02/17 0542  BP: (!) 146/95 (!) 147/66  Pulse: 72 61  Resp: 18 18  Temp: 97.9 F (36.6 C) 97.9 F (36.6 C)  SpO2: 97% 98%   9. HTN: Monitor BP bid. Continue norvasc and metoprolol,   labile will monitor for trend 10 CKD: Encourage fluid intake. Monitor renal status   Cr 1.07 on 11/20   Follow up next week 11. RLS: Managed with requip at nights and xanax prn 12. GERD: Continue protonix. 13. Seasonal allergies: On claritin and flonase--uses albuterol prn for wheezing.  14.  Chronic LBP with radiculopathy: Unable to tolerated gabapentin or tramadol. Will continue to use local measures .Oxy IR more helpful than tramadol but causing some drowsiness cont to monitor 14. Chronic constipation: Uses suppository daily. Added miralax due to reports of hard stools and now on narcotics.    Increased bowel reg again on 11/17   Improving 15. Chronic mild hyponatremia: Encourage intake.    B met normal  on 06/01/2017 16.  Dysphagia due to swelling at operative site should gradually improve   Cont to monitor 17. Hemorrhoids  Tucks pads ordered 11/17   No  significant bleeding, hemoglobin stable   LOS (Days) 7 A FACE TO FACE EVALUATION WAS PERFORMED  Faizon Capozzi T 06/02/2017, 9:20 AM

## 2017-06-02 NOTE — Patient Care Conference (Signed)
Inpatient RehabilitationTeam Conference and Plan of Care Update Date: 06/02/2017   Time: 9:00 AM    Patient Name: Lisa GamblerDorothy W Solomon      Medical Record Number: [id number]  Date of Birth: 03/19/1928 Sex: Female         Room/Bed: 4W25C/4W25C-01 Payor Info: Payor: [employer] copy[employer] MEDICARE / [facility] / Product Type: *No Product type* /    Admitting Diagnosis: Cervical Mylopathy  Admit Date/Time:  05/26/2017  5:38 PM Admission Comments: No comment available   Primary Diagnosis:  <principal problem not specified> Principal Problem: <principal problem not specified>  Patient Active Problem List   Diagnosis Date Noted  . Hemorrhoids   . Constipation due to pain medication   . Chronic low back pain with sciatica   . Stage 3 chronic kidney disease (HCC)   . Benign essential HTN   . PAF (paroxysmal atrial fibrillation) (HCC)   . Chronic constipation   . Gastroesophageal reflux disease   . RLS (restless legs syndrome)   . DDD (degenerative disc disease), cervical   . Post-operative pain   . Chronic a-fib (HCC)   . Hyponatremia   . Cervical stenosis of spine 05/24/2017  . Cervical myelopathy (HCC) 05/24/2017  . AF (paroxysmal atrial fibrillation) (HCC) 05/24/2017  . Chronic diastolic CHF (congestive heart failure) (HCC) 05/24/2017  . CKD (chronic kidney disease) stage 3, GFR 30-59 ml/min (HCC) 05/24/2017  . Essential hypertension 05/24/2017  . Right sided weakness 05/22/2017  . Neuropathic pain 05/26/2013  . Restless leg syndrome 05/26/2013  . OA (osteoarthritis) of knee 11/07/2012    Expected Discharge Date: Expected Discharge Date: 06/07/17  Team Members Present: Physician leading conference: Dr. Maryla MorrowAnkit Patel Social Worker Present: Dossie DerBecky Daleyza Gadomski, LCSW Nurse Present: Kennyth ArnoldStacey Jennings, RN PT Present: Harless Littenosita Dechalus, PTA OT Present: Callie FieldingKatie Pittman, OT SLP Present: Colin BentonMadison Cratch, SLP PPS Coordinator present : Tora DuckMarie Noel, RN, CRRN     Current Status/Progress Goal Weekly  Team Focus  Medical   cervical myelopathy with R>L weakness. making gains. HTN, dysphagia, chronic low back pain has been a barrier  improve functional mobility  pain mgt, bp control.    Bowel/Bladder   continent of bowel & bladder, LBM 06/01/17  remain continent  assist as needed   Swallow/Nutrition/ Hydration   Mod I- Supervision, s/s aspriation Min   Mod I  carryover swallow strategies,eductaion and trials of dys 3   ADL's   Close supervision functional transfers; steadying assist toileting (assist for hygiene following BM); min A UB and LB dressing using AE, set-up - mod I grooming tasks  Mod I overall  ADL re-training, use of AE Neuro re-ed for UEs, functional standing balance/ endurance   Mobility   min guard transfers, gait (decreased R DF), fair balance with mod challenges  supervision mod I   Balance, endurance, gait, family ed   Communication             Safety/Cognition/ Behavioral Observations            Pain   c/o pain to right shoulder & right hip, has tylenol, oxycodone & voltaren gel  pain scale <4  assess & treat as needed   Skin   has steristrips to cervical incision & some bruising   no new areas of skin breakdown  assess q shift      *See Care Plan and progress notes for long and short-term goals.     Barriers to Discharge  Current Status/Progress Possible Resolutions Date Resolved   Physician  Medical stability        ongoing MD mgt of pain, bp, optimize nutrition      Nursing                  PT                    OT                  SLP                SW                Discharge Planning/Teaching Needs:    Home with granddaughter providing care and will be there with her. Here daily with her to see her in therapies.     Team Discussion:  Goals mod/i-supervision level. MD working on BP and watching CKD. Doing well in her therapies and will reach goals soon.  Revisions to Treatment Plan:  DC 11/26    Continued Need for Acute  Rehabilitation Level of Care: The patient requires daily medical management by a physician with specialized training in physical medicine and rehabilitation for the following conditions: Daily direction of a multidisciplinary physical rehabilitation program to ensure safe treatment while eliciting the highest outcome that is of practical value to the patient.: Yes Daily medical management of patient stability for increased activity during participation in an intensive rehabilitation regime.: Yes Daily analysis of laboratory values and/or radiology reports with any subsequent need for medication adjustment of medical intervention for : Neurological problems;Post surgical problems  Kannan Proia, Lemar LivingsRebecca G 06/02/2017, 11:48 AM

## 2017-06-02 NOTE — Progress Notes (Signed)
Physical Therapy Session Note  Patient Details  Name: [name] MRN: [id number] Date of Birth: [birth year]  Today's Date: 06/02/2017 PT Individual Time: 0800-0900 PT Individual Time Calculation (min): 60 min   Short Term Goals: Week 1:  PT Short Term Goal 1 (Week 1): Pt will perform sit<>stand with min assist and LRAD  PT Short Term Goal 2 (Week 1): Pt will perform bed mobility with min assist  PT Short Term Goal 3 (Week 1): Pt will ambulate 136f with min assist and LRAD  PT Short Term Goal 4 (Week 1): Pt [facility] perform stand [facility] consistant with LRAD  Skilled Therapeutic Interventions/Progress Updates: Pt presented at toilet, pt able to complete peri hygiene in standing with min guard. Pt donned pants at toilet with minA and pt able to stand and pull up pants with min guard. Pt ambulated to sink and performed hand hygiene with no AD with supervision. Pt returned to chair and ate breakfast with nsg arriving to administer meds. Pt able to stand without AD to permit student nurse administer topical gel on hip. Pt then ambulated to sink to brush teeth with min guard. Pt returned to chair and left with call bell within reach and needs met.      Therapy Documentation Precautions:  Precautions Precautions: Fall, Cervical Required Braces or Orthoses: Cervical Brace Cervical Brace: Soft collar, At all times Restrictions Weight Bearing Restrictions: No General:   Vital Signs: Therapy Vitals Temp: 97.9 F (36.6 C) Temp Source: Oral Pulse Rate: 61 Resp: 18 BP: (!) 147/66(RN notifed) Patient Position (if appropriate): Lying Oxygen Therapy SpO2: 98 % O2 Device: Not Delivered Pain: Pain Assessment Pain Assessment: No/denies pain   See Function Navigator for Current Functional Status.   Therapy/Group: Individual Therapy  Lisa Chai  Kalsey Solomon, PTA  06/02/2017, 9:35 AM

## 2017-06-03 NOTE — Progress Notes (Signed)
Subjective/Complaints: Denies pain.  Slept quite well last night.  ROS: pt denies nausea, vomiting, diarrhea, cough, shortness of breath or chest pain   Objective: Vital Signs: Blood pressure (!) 133/52, pulse 61, temperature (!) 97.5 F (36.4 C), temperature source Oral, resp. rate 17, height '5\' 7"'$  (1.702 m), weight 81.1 kg (178 lb 12.7 oz), SpO2 97 %. No results found. Results for orders placed or performed during the hospital encounter of 05/26/17 (from the past 72 hour(s))  Basic metabolic panel     Status: Abnormal   Collection Time: 06/01/17  5:02 AM  Result Value Ref Range   Sodium 136 135 - 145 mmol/L   Potassium 3.9 3.5 - 5.1 mmol/L   Chloride 101 101 - 111 mmol/L   CO2 28 22 - 32 mmol/L   Glucose, Bld 92 65 - 99 mg/dL   BUN 22 (H) 6 - 20 mg/dL   Creatinine, Ser 1.07 (H) 0.44 - 1.00 mg/dL   Calcium 8.6 (L) 8.9 - 10.3 mg/dL   GFR calc non Af Amer 45 (L) >60 mL/min   GFR calc Af Amer 53 (L) >60 mL/min    Comment: (NOTE) The eGFR has been calculated using the CKD EPI equation. This calculation has not been validated in all clinical situations. eGFR's persistently <60 mL/min signify possible Chronic Kidney Disease.    Anion gap 7 5 - 15  CBC     Status: Abnormal   Collection Time: 06/01/17  5:02 AM  Result Value Ref Range   WBC 5.6 4.0 - 10.5 K/uL   RBC 3.63 (L) 3.87 - 5.11 MIL/uL   Hemoglobin 10.8 (L) 12.0 - 15.0 g/dL   HCT 33.2 (L) 36.0 - 46.0 %   MCV 91.5 78.0 - 100.0 fL   MCH 29.8 26.0 - 34.0 pg   MCHC 32.5 30.0 - 36.0 g/dL   RDW 14.5 11.5 - 15.5 %   Platelets 198 150 - 400 K/uL     HEENT: Incision c/d/i. Normocephalic, atraumatic. Neck: Soft collar in place Cardio: Regular rate and rhythm without JVD Resp: CTA Bilaterally without wheezes or rales. Normal effort  GI: BS positive and ND Skin:   Wound C/D/I and IV site clean , no erythema, ecchymosis in upper chest area Neuro: Alert/Oriented Motor 4/5 Right delt, bi, tri, grip,--stable 3- to 3/5 right  HF,  KE, 4/5 ADF --stable 5/5 LUE LLE: 4/5 proximal to distal -senses pain and light touch Musc/Skel:  No edema, no tenderness Gen NAD.   Assessment/Plan: 1. Functional deficits secondary to cervical myelopathy  which require 3+ hours per day of interdisciplinary therapy in a comprehensive inpatient rehab setting. Physiatrist is providing close team supervision and 24 hour management of active medical problems listed below. Physiatrist and rehab team continue to assess barriers to discharge/monitor patient progress toward functional and medical goals. FIM: Function - Bathing Bathing activity did not occur: N/A Position: Shower Body parts bathed by patient: Right arm, Chest, Abdomen, Front perineal area, Right upper leg, Left upper leg, Left arm, Right lower leg, Left lower leg, Buttocks Body parts bathed by helper: Back Assist Level: Supervision or verbal cues  Function- Upper Body Dressing/Undressing Upper body dressing/undressing activity did not occur: N/A What is the patient wearing?: Pull over shirt/dress Pull over shirt/dress - Perfomed by patient: Thread/unthread right sleeve, Put head through opening, Thread/unthread left sleeve, Pull shirt over trunk Pull over shirt/dress - Perfomed by helper: Pull shirt over trunk Button up shirt - Perfomed by patient: Thread/unthread left sleeve Button  up shirt - Perfomed by helper: Thread/unthread right sleeve, Pull shirt around back, Button/unbutton shirt Assist Level: Supervision or verbal cues Function - Lower Body Dressing/Undressing Lower body dressing/undressing activity did not occur: N/A What is the patient wearing?: Pants, Non-skid slipper socks Position: Other (comment)(Sitting on toilet) Pants- Performed by patient: Thread/unthread right pants leg, Thread/unthread left pants leg, Pull pants up/down Pants- Performed by helper: Thread/unthread right pants leg, Thread/unthread left pants leg, Pull pants up/down Non-skid slipper socks-  Performed by helper: Don/doff right sock, Don/doff left sock Assist for footwear: Maximal assist Assist for lower body dressing: Touching or steadying assistance (Pt > 75%)  Function - Toileting Toileting activity did not occur: N/A Toileting steps completed by patient: Adjust clothing prior to toileting, Performs perineal hygiene, Adjust clothing after toileting Toileting steps completed by helper: Performs perineal hygiene Toileting Assistive Devices: Grab bar or rail Assist level: Touching or steadying assistance (Pt.75%)  Function - Air cabin crew transfer activity did not occur: N/A Toilet transfer assistive device: Grab bar, Walker Assist level to toilet: Supervision or verbal cues Assist level from toilet: Supervision or verbal cues  Function - Chair/bed transfer Chair/bed transfer activity did not occur: N/A Chair/bed transfer method: Ambulatory, Stand pivot Chair/bed transfer assist level: Touching or steadying assistance (Pt > 75%) Chair/bed transfer assistive device: Armrests, Walker Chair/bed transfer details: Verbal cues for technique, Verbal cues for safe use of DME/AE  Function - Locomotion: Wheelchair Type: Manual Max wheelchair distance: 62f  Assist Level: Touching or steadying assistance (Pt > 75%) Assist Level: Touching or steadying assistance (Pt > 75%) Wheel 150 feet activity did not occur: Safety/medical concerns Turns around,maneuvers to table,bed, and toilet,negotiates 3% grade,maneuvers on rugs and over doorsills: No Function - Locomotion: Ambulation Assistive device: Walker-rolling Max distance: 125' Assist level: Touching or steadying assistance (Pt > 75%) Assist level: Touching or steadying assistance (Pt > 75%) Assist level: Touching or steadying assistance (Pt > 75%) Walk 150 feet activity did not occur: Safety/medical concerns Walk 10 feet on uneven surfaces activity did not occur: Safety/medical concerns Assist level: Touching or  steadying assistance (Pt > 75%)  Function - Comprehension Comprehension: Auditory Comprehension assist level: Follows complex conversation/direction with extra time/assistive device  Function - Expression Expression: Verbal Expression assist level: Expresses complex ideas: With extra time/assistive device  Function - Social Interaction Social Interaction assist level: Interacts appropriately with others with medication or extra time (anti-anxiety, antidepressant).  Function - Problem Solving Problem solving assist level: Solves basic problems with no assist  Function - Memory Memory assist level: More than reasonable amount of time Patient normally able to recall (first 3 days only): Current season, That he or she is in a hospital, Location of own room, Staff names and faces  Medical Problem List and Plan: 1.  Gait abnormality and limitations in self-care secondary to cervical myelopathy.   Cont CIR PT, OT, SLP  2.  DVT Prophylaxis/Anticoagulation: Mechanical: Sequential compression devices, below knee Bilateral lower extremities, non pharmacologic due to cervical surgery 3. Pain Management: Will continue oxycodone prn. Add voltaren gel additionally with ice/heat for local measures.  4. Mood: LCSW to follow for evaluation and support.  5. Neuropsych: This patient is capable of making decisions on her own behalf. 6. Skin/Wound Care: monitor wound for healing. Add nutritional supplement to promote healing 7. Fluids/Electrolytes/Nutrition: Monitor I/O. Check lytes in am.  8. A fib: Monitor heart rate bid--on amiodarone at bedtime with toprol XL bid. Xarelto on hold, HR controlled 11/22 Vitals:   06/02/17  2220 06/03/17 0500  BP: (!) 147/71 (!) 133/52  Pulse: 74 61  Resp:  17  Temp:  (!) 97.5 F (36.4 C)  SpO2:  97%   9. HTN: Monitor BP bid. Continue norvasc and metoprolol,   labile will monitor for trend 10 CKD: Encourage fluid intake. Monitor renal status   Cr 1.07 on 11/20    Follow up next week 11. RLS: Managed with requip at nights and xanax prn 12. GERD: Continue protonix. 13. Seasonal allergies: On claritin and flonase--uses albuterol prn for wheezing.  14.  Chronic LBP with radiculopathy: Unable to tolerated gabapentin or tramadol. Will continue to use local measures .Oxy IR more helpful than tramadol but causing some drowsiness cont to monitor 14. Chronic constipation: Uses suppository daily. Added miralax due to reports of hard stools and now on narcotics.    Increased bowel reg again on 11/17   Improving 15. Chronic mild hyponatremia: Encourage intake.    B met normal on 06/01/2017 16.  Dysphagia due to swelling at operative site should gradually improve   Cont to monitor 17. Hemorrhoids  Tucks pads ordered 11/17   No significant bleeding, hemoglobin stable   LOS (Days) 8 A FACE TO FACE EVALUATION WAS PERFORMED  [name] T 06/03/2017, 9:16 AM

## 2017-06-04 ENCOUNTER — Inpatient Hospital Stay (HOSPITAL_COMMUNITY): Payer: Medicare Other | Admitting: Physical Therapy

## 2017-06-04 ENCOUNTER — Inpatient Hospital Stay (HOSPITAL_COMMUNITY): Payer: Medicare Other

## 2017-06-04 ENCOUNTER — Inpatient Hospital Stay (HOSPITAL_COMMUNITY): Payer: Medicare Other | Admitting: Speech Pathology

## 2017-06-04 MED ORDER — PANTOPRAZOLE SODIUM 40 MG PO TBEC
40.0000 mg | DELAYED_RELEASE_TABLET | Freq: Every day | ORAL | Status: DC
  Administered 2017-06-05 – 2017-06-07 (×3): 40 mg via ORAL
  Filled 2017-06-04 (×3): qty 1

## 2017-06-04 NOTE — Progress Notes (Signed)
Speech Language Pathology Daily Session Note  Patient Details  Name: SHAKTI FLEER MRN: [id number] Date of Birth: 07/30/1927  Today's Date: 06/04/2017 SLP Individual Time: 1510-1530 SLP Individual Time Calculation (min): 20 min  Short Term Goals: Week 1: SLP Short Term Goal 1 (Week 1): Pt will consume regular textures with thin liquids with minimal overt s/s of aspiration and mod I use of swallowing precautions.  SLP Short Term Goal 1 - Progress (Week 1): Updated due to goal met SLP Short Term Goal 2 (Week 1): Pt will complete Modified Barium Swallow Study to determine safest diet.  SLP Short Term Goal 2 - Progress (Week 1): Met  Skilled Therapeutic Interventions:  Pt was seen for skilled ST targeting dysphagia goals.  Pt consumed a functional snack of regular textures and thin liquids (graham cracker and peanut butter with water via straw).  Pt had slight, intermittent coughing which appeared to occur most consistently with talking while eating.  S/s of aspiration were mitigated when pt ceased talking until food and liquids were cleared from oral cavity.  Pt was left in recliner with call bell within reach.  Continue per current plan of care.    Function:  Eating Eating   Modified Consistency Diet: No Eating Assist Level: Swallowing techniques: self managed           Cognition Comprehension Comprehension assist level: Follows complex conversation/direction with extra time/assistive device  Expression   Expression assist level: Expresses complex ideas: With extra time/assistive device  Social Interaction Social Interaction assist level: Interacts appropriately with others with medication or extra time (anti-anxiety, antidepressant).  Problem Solving Problem solving assist level: Solves basic problems with no assist  Memory Memory assist level: More than reasonable amount of time    Pain Pain Assessment Pain Assessment: No/denies pain  Therapy/Group: Individual  Therapy  Crandall Harvel, Selinda Orion 06/04/2017, 4:01 PM

## 2017-06-04 NOTE — Progress Notes (Signed)
Occupational Therapy Session Note  Patient Details  Name: [name] MRN: [id number] Date of Birth: 08/20/1927  Today's Date: 06/04/2017 OT Individual Time: 1300-1400 OT Individual Time Calculation (min): 60 min    Short Term Goals: Week 1:  OT Short Term Goal 1 (Week 1): Pt will complete 3/3 toileting tasks with steadying assist OT Short Term Goal 2 (Week 1): Pt will complete grooming task standing at sink with CGA in order to increase functional standing balance/endurance OT Short Term Goal 3 (Week 1): Pt will correctly use AE PRN during dressing tasks independently  Skilled Therapeutic Interventions/Progress Updates:    1:1. Pt without any c/o pain. Pt ambualtes with CGA-supervision with RW and VC for R foot clearance to day room. Focus of session on [facility] and handwriting. Pt completes finger to palm translation, rotation and shift with coin/board game pieces with RUE with VC for decreased compensatory movements. Pt practices handwriting with large pen signing name, cursive alphabet, circles and lines. Pt ambulates back to room with same A as above. Pt toilets with CGA upon entering room. Exited session with pt seated in recliner with call light in reach and all needs met .  Therapy Documentation Precautions:  Precautions Precautions: Fall, Cervical Required Braces or Orthoses: Cervical Brace Cervical Brace: Soft collar, At all times Restrictions Weight Bearing Restrictions: No  See Function Navigator for Current Functional Status.   Therapy/Group: Individual Therapy  Tonny Branch 06/04/2017, 1:38 PM

## 2017-06-04 NOTE — Progress Notes (Signed)
Subjective/Complaints:  had a good night. No pain this morning  ROS: pt denies nausea, vomiting, diarrhea, cough, shortness of breath or chest pain   Objective: Vital Signs: Blood pressure 121/61, pulse 60, temperature 98.5 F (36.9 C), temperature source Oral, resp. rate 16, height 5' 7" (1.702 m), weight 81.1 kg (178 lb 12.7 oz), SpO2 100 %. No results found. No results found for this or any previous visit (from the past 72 hour(s)).   HEENT: Incision c/d/i. Normocephalic, atraumatic. Neck: Soft collar in place Cardio: RRR without murmur. No JVD  Resp: CTA Bilaterally without wheezes or rales. Normal effort  GI: BS positive and ND Skin:   Wound C/D/I and IV site clean , no erythema, ecchymosis in upper chest area Neuro: Alert/Oriented Motor 4/5 Right delt, bi, tri, grip,--stable 3- to 3/5 right  HF, KE, 4/5 ADF --stable 5/5 LUE LLE: 4/5 proximal to distal -senses pain and light touch Musc/Skel:  No edema, no tenderness Gen NAD.   Assessment/Plan: 1. Functional deficits secondary to cervical myelopathy  which require 3+ hours per day of interdisciplinary therapy in a comprehensive inpatient rehab setting. Physiatrist is providing close team supervision and 24 hour management of active medical problems listed below. Physiatrist and rehab team continue to assess barriers to discharge/monitor patient progress toward functional and medical goals. FIM: Function - Bathing Bathing activity did not occur: N/A Position: Shower Body parts bathed by patient: Right arm, Chest, Abdomen, Front perineal area, Right upper leg, Left upper leg, Left arm, Right lower leg, Left lower leg, Buttocks Body parts bathed by helper: Back Assist Level: Supervision or verbal cues  Function- Upper Body Dressing/Undressing Upper body dressing/undressing activity did not occur: N/A What is the patient wearing?: Pull over shirt/dress Pull over shirt/dress - Perfomed by patient: Thread/unthread right  sleeve, Put head through opening, Thread/unthread left sleeve, Pull shirt over trunk Pull over shirt/dress - Perfomed by helper: Pull shirt over trunk Button up shirt - Perfomed by patient: Thread/unthread left sleeve Button up shirt - Perfomed by helper: Thread/unthread right sleeve, Pull shirt around back, Button/unbutton shirt Assist Level: Supervision or verbal cues Function - Lower Body Dressing/Undressing Lower body dressing/undressing activity did not occur: N/A What is the patient wearing?: Pants, Non-skid slipper socks Position: Other (comment)(Sitting on toilet) Pants- Performed by patient: Thread/unthread right pants leg, Thread/unthread left pants leg, Pull pants up/down Pants- Performed by helper: Thread/unthread right pants leg, Thread/unthread left pants leg, Pull pants up/down Non-skid slipper socks- Performed by helper: Don/doff right sock, Don/doff left sock Assist for footwear: Maximal assist Assist for lower body dressing: Touching or steadying assistance (Pt > 75%)  Function - Toileting Toileting activity did not occur: N/A Toileting steps completed by patient: Adjust clothing prior to toileting, Performs perineal hygiene, Adjust clothing after toileting Toileting steps completed by helper: Performs perineal hygiene Toileting Assistive Devices: Grab bar or rail Assist level: Touching or steadying assistance (Pt.75%)  Function - Air cabin crew transfer activity did not occur: N/A Toilet transfer assistive device: Grab bar, Walker Assist level to toilet: Supervision or verbal cues Assist level from toilet: Supervision or verbal cues  Function - Chair/bed transfer Chair/bed transfer activity did not occur: N/A Chair/bed transfer method: Ambulatory, Stand pivot Chair/bed transfer assist level: Touching or steadying assistance (Pt > 75%) Chair/bed transfer assistive device: Armrests, Walker Chair/bed transfer details: Verbal cues for technique, Verbal cues  for safe use of DME/AE  Function - Locomotion: Wheelchair Type: Manual Max wheelchair distance: 45f  Assist Level: Touching or steadying  assistance (Pt > 75%) Assist Level: Touching or steadying assistance (Pt > 75%) Wheel 150 feet activity did not occur: Safety/medical concerns Turns around,maneuvers to table,bed, and toilet,negotiates 3% grade,maneuvers on rugs and over doorsills: No Function - Locomotion: Ambulation Assistive device: Walker-rolling Max distance: 125' Assist level: Touching or steadying assistance (Pt > 75%) Assist level: Touching or steadying assistance (Pt > 75%) Assist level: Touching or steadying assistance (Pt > 75%) Walk 150 feet activity did not occur: Safety/medical concerns Walk 10 feet on uneven surfaces activity did not occur: Safety/medical concerns Assist level: Touching or steadying assistance (Pt > 75%)  Function - Comprehension Comprehension: Auditory Comprehension assist level: Follows complex conversation/direction with extra time/assistive device  Function - Expression Expression: Verbal Expression assist level: Expresses complex ideas: With extra time/assistive device  Function - Social Interaction Social Interaction assist level: Interacts appropriately with others with medication or extra time (anti-anxiety, antidepressant).  Function - Problem Solving Problem solving assist level: Solves basic problems with no assist  Function - Memory Memory assist level: More than reasonable amount of time Patient normally able to recall (first 3 days only): Current season, That he or she is in a hospital, Location of own room, Staff names and faces  Medical Problem List and Plan: 1.  Gait abnormality and limitations in self-care secondary to cervical myelopathy.   Cont CIR PT, OT, SLP  2.  DVT Prophylaxis/Anticoagulation: Mechanical: Sequential compression devices, below knee Bilateral lower extremities, non pharmacologic due to cervical  surgery 3. Pain Management: Will continue oxycodone prn. Add voltaren gel additionally with ice/heat for local measures.  4. Mood: LCSW to follow for evaluation and support.  5. Neuropsych: This patient is capable of making decisions on her own behalf. 6. Skin/Wound Care: monitor wound for healing. Add nutritional supplement to promote healing 7. Fluids/Electrolytes/Nutrition: Monitor I/O. Check lytes in am.  8. A fib: Monitor heart rate bid--on amiodarone at bedtime with toprol XL bid. Xarelto on hold, HR remains controlled 11/22 Vitals:   06/03/17 2019 06/04/17 0500  BP: (!) 146/78 121/61  Pulse: 67 60  Resp:  16  Temp:  98.5 F (36.9 C)  SpO2: 99% 100%   9. HTN: Monitor BP bid. Continue norvasc and metoprolol,   labile will monitor for trend 10 CKD: Encourage fluid intake. Monitor renal status   Cr 1.07 on 11/20   Follow up next week 11. RLS: Managed with requip at nights and xanax prn 12. GERD: Continue protonix. 13. Seasonal allergies: On claritin and flonase--uses albuterol prn for wheezing.  14.  Chronic LBP with radiculopathy: Unable to tolerated gabapentin or tramadol. Will continue to use local measures .Oxy IR more helpful than tramadol but causing some drowsiness cont to monitor 14. Chronic constipation: Uses suppository daily. Added miralax due to reports of hard stools and now on narcotics.    Increased bowel reg again on 11/17   Improving 15. Chronic mild hyponatremia: Encourage intake.    B met normal on 06/01/2017 16.  Dysphagia due to swelling at operative site should gradually improve   Cont to monitor 17. Hemorrhoids  Tucks pads ordered 11/17   No significant bleeding, hemoglobin has been stable   LOS (Days) 9 A FACE TO FACE EVALUATION WAS PERFORMED  SWARTZ,ZACHARY T 06/04/2017, 9:55 AM

## 2017-06-04 NOTE — Progress Notes (Signed)
Social Work Patient ID: [name], female   DOB: 11/07/1927, 83 y.o.   MRN: [id number] Have found Home Health agency that followed pt in 2014, have made referral to [facility] to provide HHPT and HHOT upon discharge Monday. No equipment needs. Pt set for discharge Monday.

## 2017-06-04 NOTE — Progress Notes (Signed)
Physical Therapy Session Note  Patient Details  Name: [name] MRN: [id number] Date of Birth: 1928/01/21  Today's Date: 06/04/2017 PT Individual Time: 1000-1100 PT Individual Time Calculation (min): 60 min   Short Term Goals: Week 1:  PT Short Term Goal 1 (Week 1): Pt will perform sit<>stand with min assist and LRAD  PT Short Term Goal 2 (Week 1): Pt will perform bed mobility with min assist  PT Short Term Goal 3 (Week 1): Pt will ambulate 174f with min assist and LRAD  PT Short Term Goal 4 (Week 1): Pt will perform stand pivot transfer to and [facility] assist consistant with LRAD Week 2:     Skilled Therapeutic Interventions/Progress Updates:   Pt received sitting in WC and agreeable to PT  PT instructed pt in Bathing with overall distant supervision except to assist pt with proper use of long handled sponge for proper cleaning of back. PT instructed pt in dressing task and min assist for threading BLE  through pant legs. All other dressing tasks completed with supervision assist.   Gait with RW x 1584fand supervision assist. Moderate cues for improved step height on the RLE to prevent foot drag. Additional gait with RW and RAFO. Significant improvement in foot clearance with AFO and mild improvement in r knee control to prevent GR. Pt ambulated [id number] AFO.   Patient returned to room and [facility] reach and all needs met.        Therapy Documentation Precautions:  Precautions Precautions: Fall, Cervical Required Braces or Orthoses: Cervical Brace Cervical Brace: Soft collar, At all times Restrictions Weight Bearing Restrictions: No    Pain: Pain Assessment Pain Assessment: No/denies pain  See Function Navigator for Current Functional Status.   Therapy/Group: Individual Therapy  AustLorie Phenix23/2018, 1:50 PM

## 2017-06-04 NOTE — Progress Notes (Signed)
Speech Language Pathology Daily Session Note  Patient Details  Name: [name] MRN: [id number] Date of Birth: 07/02/1928  Today's Date: 06/04/2017 SLP Individual Time: 1130-1225 SLP Individual Time Calculation (min): 55 min  Short Term Goals: Week 1: SLP Short Term Goal 1 (Week 1): Pt will consume regular textures with thin liquids with minimal overt s/s of aspiration and mod I use of swallowing precautions.  SLP Short Term Goal 1 - Progress (Week 1): Updated due to goal met SLP Short Term Goal 2 (Week 1): Pt will complete Modified Barium Swallow Study to determine safest diet.  SLP Short Term Goal 2 - Progress (Week 1): Met  Skilled Therapeutic Interventions:  Pt was seen for skilled ST targeting dysphagia goals.  Therapist facilitated the session with a trial meal tray of regular textures which pt consumed with mod I use of swallowing precautions.  Pt demonstrated intermittently wet vocal quality and gentle coughing following mixed solid and liquid consistencies which cleared with pt's self initiated use of volitional throat clear and multiple swallows.  Recommend advancing pt to regular textures and thin liquids to allow time for monitoring of toleration given that pt is going home imminently and will likely resume a regular diet at discharge.  Pt was left in recliner with call bell within reach.  Continue per current plan of care.    Function:  Eating Eating   Modified Consistency Diet: No Eating Assist Level: Swallowing techniques: self managed           Cognition Comprehension Comprehension assist level: Follows complex conversation/direction with extra time/assistive device  Expression   Expression assist level: Expresses complex ideas: With extra time/assistive device  Social Interaction Social Interaction assist level: Interacts appropriately with others with medication or extra time (anti-anxiety, antidepressant).  Problem Solving Problem solving assist level: Solves  basic problems with no assist  Memory Memory assist level: More than reasonable amount of time    Pain Pain Assessment Pain Assessment: No/denies pain  Therapy/Group: Individual Therapy  Page, Nicole L 06/04/2017, 12:46 PM   

## 2017-06-05 ENCOUNTER — Inpatient Hospital Stay (HOSPITAL_COMMUNITY): Payer: Medicare Other

## 2017-06-05 ENCOUNTER — Inpatient Hospital Stay (HOSPITAL_COMMUNITY): Payer: Medicare Other | Admitting: Speech Pathology

## 2017-06-05 NOTE — Progress Notes (Signed)
Speech Language Pathology Daily Session Note  Patient Details  Name: [name] MRN: [id number] Date of Birth: 02-28-1928  Today's Date: 06/05/2017 SLP Individual Time: 0710-0805 SLP Individual Time Calculation (min): 55 min  Short Term Goals: Week 1: SLP Short Term Goal 1 (Week 1): Pt will consume regular textures with thin liquids with minimal overt s/s of aspiration and mod I use of swallowing precautions.  SLP Short Term Goal 1 - Progress (Week 1): Updated due to goal met SLP Short Term Goal 2 (Week 1): Pt will complete Modified Barium Swallow Study to determine safest diet.  SLP Short Term Goal 2 - Progress (Week 1): Met  Skilled Therapeutic Interventions: Skilled treatment session focused on dysphagia goals. SLP facilitated session by providing skilled observation with breakfast meal of regular textures with thin liquids. Patient consumed meal with intermittent, subtle throat clearing, suspect due to talking with a full oral cavity. Patient also independently utilized liquid washes and multiple swallows with Mod I to clear oral reside and suspected pharyngeal residue. Recommend patient continue current diet with intermittent supervision. Patient left upright at EOB with NT present. Continue with current plan of care.      Function:  Eating Eating   Modified Consistency Diet: No Eating Assist Level: Swallowing techniques: self managed   Eating Set Up Assist For: Opening containers       Cognition Comprehension Comprehension assist level: Follows complex conversation/direction with extra time/assistive device  Expression   Expression assist level: Expresses complex ideas: With extra time/assistive device  Social Interaction Social Interaction assist level: Interacts appropriately with others with medication or extra time (anti-anxiety, antidepressant).  Problem Solving Problem solving assist level: Solves basic problems with no assist  Memory Memory assist level: More than  reasonable amount of time    Pain No/Denies Pain   Therapy/Group: Individual Therapy  Corazon Nickolas 06/05/2017, 8:20 AM

## 2017-06-05 NOTE — Progress Notes (Signed)
Orthopedic Tech Progress Note Patient Details:  Lisa GamblerDorothy W Solomon 07/24/1927 [id number]  Patient ID: Lisa Gamblerorothy W Solomon, female   DOB: 06/26/1928, 83 y.o.   MRN: [id number]   Nikki DomCrawford, [name] 06/05/2017, 9:43 AM Called in [employer] brace order; spoke with answering service

## 2017-06-05 NOTE — Progress Notes (Signed)
Occupational Therapy Session Note  Patient Details  Name: [name] MRN: [id number] Date of Birth: 11/22/1927  Today's Date: 06/05/2017 OT Individual Time: [id number] OT Individual Time Calculation (min): 57 min    Short Term Goals: Week 1:  OT Short Term Goal 1 (Week 1): Pt will complete 3/3 toileting tasks with steadying assist OT Short Term Goal 2 (Week 1): Pt will complete grooming task standing at sink with CGA in order to increase functional standing balance/endurance OT Short Term Goal 3 (Week 1): Pt will correctly use AE PRN during dressing tasks independently  Skilled Therapeutic Interventions/Progress Updates:    1;1. OT provides A to don R shoe/AFO and pt dons L shoe while seated in recliner. Pt with no c/o pain. Pt ambulates with RW to dayroom with supervision and VC for longer strides and looking ahead. OT demonstrates shoe funnel to don R shoe and pt return demonstrates with VC for technique/strapping AFO. Pt sits at table to play scrabble game with [facility] RUE/manilupation of pieces and handwriting scoring. Exited session with pt seated in recliner with call light in reach and granddaughters in room.  Therapy Documentation Precautions:  Precautions Precautions: Fall, Cervical Required Braces or Orthoses: Cervical Brace Cervical Brace: Soft collar, At all times Restrictions Weight Bearing Restrictions: No  See Function Navigator for Current Functional Status.   Therapy/Group: Individual Therapy  Shon HaleStephanie M Aubryana Vittorio 06/05/2017, 4:51 PM

## 2017-06-05 NOTE — Progress Notes (Signed)
Occupational Therapy Discharge Summary  Patient Details  Name: [name] MRN: [id number] Date of Birth: [birth year]  Today's Date: 06/06/2017 OT Individual Time: 1100-1200 OT Individual Time Calculation (min): 60 min    Skilled tx: Pt with no c/o pain. Pt ambulates with RW throughout session to gather needed bathing/dressing items. Pt transfers onto toilet with MOD I to void bladder completing 3/3 components of toileting with increased time. Pt doffs clothes on toilet and transfers into shower to bathe at sit to stand level with MOD I. Pt drops soap during shower and OT retrieves it. OT educates on soap on a rope if pt does not feel comfortable bending forward while showering. Pt uses LHSS to wash B feet. Pt dresses at sit to stand level on EOB with MOD I utilizing shoe funnel to don R AFO/shoe. Pt declines buttoning button up shirt as she has on undershirt. Educated pt on use of button hook to compensate for decreased [place]. Pt grooms in standing with MOD I for oral/hair care. Pt reports only pan bathing at home until stronger, but may complete showering at daughters house where they have a walk in shower. Pt completes posterior walk in shower transfer with RW with supervision and VC for sequencing. Exited session with pt seated in w/c with call light in reach and all needs met.   Patient has met 12 of 12 long term goals due to improved activity tolerance, improved balance, postural control, ability to compensate for deficits, functional use of  RIGHT upper, RIGHT lower, LEFT upper and LEFT lower extremity and improved coordination.  Patient to discharge at overall Modified Independent level.  Patient's care partner is independent to provide the necessary supervision assistance at discharge.    Reasons goals not met: n/a  Recommendation:  Patient will benefit from ongoing skilled OT services in home health setting to continue to advance functional skills in the area of BADL and iADL.  Recommend pt only complete bathing/shower transfers with family present to supervise transfers into/out of tub. Pt verbalized only wanting to pan bathe when alone and going to shower at families house with walk in shower.   Recommend follow up [place] work on [facility], [place] and coordination of limbs, strength, endurance, shower transfers, balance, and IADLs as pt is very motivated to get back to taking care of house.  Equipment: No equipment provided Pt declined tub bench and reports planning on pan bathing if no one available and planning to shower at daughters homes where they have a walk in shower.  Reasons for discharge: treatment goals met and discharge from hospital  Patient/family agrees with progress made and goals achieved: Yes  OT Discharge Precautions/Restrictions  Precautions Precautions: Fall;Cervical Required Braces or Orthoses: Cervical Brace Cervical Brace: Soft collar;At all times Restrictions Weight Bearing Restrictions: No Pain Pain Assessment Faces Pain Scale: No hurt ADL   Vision Baseline Vision/History: Wears glasses Wears Glasses: At all times Vision Assessment?: No apparent visual deficits Perception  Perception: Within Functional Limits Praxis Praxis: Intact Cognition Overall Cognitive Status: Within Functional Limits for tasks assessed Arousal/Alertness: Awake/alert Orientation Level: Oriented X4 Memory: Appears intact Awareness: Appears intact Problem Solving: Appears intact Sensation Sensation Light Touch: Impaired Detail Light Touch Impaired Details: Impaired RUE;Impaired RLE Proprioception: Appears Intact Additional Comments: Pt reports intermittent parathesia in the RLE Coordination Gross Motor Movements are Fluid and Coordinated: No Fine Motor Movements are Fluid and Coordinated: No Motor  Motor Motor: Tetraplegia Motor - Skilled Clinical Observations: Incomplete tetraplegia C3-C7 R>L  Mobility  Transfers Sit to Stand: 6: Modified  independent (Device/Increase time)  Trunk/Postural Assessment  Cervical Assessment Cervical Assessment: Exceptions to WFL(soft collar) Thoracic Assessment Thoracic Assessment: Exceptions to WFL(kyphotic) Lumbar Assessment Lumbar Assessment: Exceptions to WFL(posterior pelvic tilt) Postural Control Postural Control: Deficits on evaluation(delayed)  Balance Balance Balance Assessed: Yes Dynamic Sitting Balance Dynamic Sitting - Level of Assistance: 6: Modified independent (Device/Increase time) Dynamic Standing Balance Dynamic Standing - Balance Support: Left upper extremity supported Dynamic Standing - Level of Assistance: 6: Modified independent (Device/Increase time) Dynamic Standing - Comments: LB dressing Extremity/Trunk Assessment RUE Assessment RUE Assessment: Exceptions to WFL(not formally assessed d/t precautions; pt able to use UEs at functional level >eval) LUE Assessment LUE Assessment: Exceptions to WFL(not formally assessed 2/2 precautions, Pt able to use at functional level >eval)   See Function Navigator for Current Functional Status.  [name] 06/05/2017, 10:22 AM

## 2017-06-05 NOTE — Progress Notes (Signed)
06/05/17 1328 nursing Trey PaulaJeff with [employer] came and working on the brace as ordered for the patient.

## 2017-06-05 NOTE — Progress Notes (Signed)
Subjective/Complaints: Up at edge of bed working with speech language pathology on breakfast.  Pain is controlled and she has no other complaints  ROS: pt denies nausea, vomiting, diarrhea, cough, shortness of breath or chest pain   Objective: Vital Signs: Blood pressure 135/64, pulse (!) 56, temperature 97.6 F (36.4 C), temperature source Oral, resp. rate 16, height 5\' 7"  (1.702 m), weight 81.1 kg (178 lb 12.7 oz), SpO2 97 %. No results found. No results found for this or any previous visit (from the past 72 hour(s)).   HEENT: Incision c/d/i. Normocephalic, atraumatic. Neck: Soft collar in place Cardio: Regular rate and rhythm without JVD Resp: CTA Bilaterally without wheezes or rales. Normal effort  GI: BS positive and ND Skin:   Wound C/D/I and IV site clean , no erythema, ecchymosis in upper chest area Neuro: Alert/Oriented Motor 4/5 Right delt, bi, tri, grip,--stable 3- to 3/5 right  HF, KE, 4/5 ADF --stable 5/5 LUE LLE: 4/5 proximal to distal -senses pain and light touch Musc/Skel:  No edema, no tenderness Gen NAD.   Assessment/Plan: 1. Functional deficits secondary to cervical myelopathy  which require 3+ hours per day of interdisciplinary therapy in a comprehensive inpatient rehab setting. Physiatrist is providing close team supervision and 24 hour management of active medical problems listed below. Physiatrist and rehab team continue to assess barriers to discharge/monitor patient progress toward functional and medical goals. FIM: Function - Bathing Bathing activity did not occur: N/A Position: Shower Body parts bathed by patient: Right arm, Chest, Abdomen, Front perineal area, Right upper leg, Left upper leg, Left arm, Right lower leg, Left lower leg, Buttocks Body parts bathed by helper: Back Assist Level: Supervision or verbal cues  Function- Upper Body Dressing/Undressing Upper body dressing/undressing activity did not occur: N/A What is the patient wearing?:  Pull over shirt/dress Pull over shirt/dress - Perfomed by patient: Thread/unthread right sleeve, Put head through opening, Thread/unthread left sleeve, Pull shirt over trunk Pull over shirt/dress - Perfomed by helper: Pull shirt over trunk Button up shirt - Perfomed by patient: Thread/unthread left sleeve Button up shirt - Perfomed by helper: Thread/unthread right sleeve, Pull shirt around back, Button/unbutton shirt Assist Level: Supervision or verbal cues Function - Lower Body Dressing/Undressing Lower body dressing/undressing activity did not occur: N/A What is the patient wearing?: Pants, Non-skid slipper socks Position: Other (comment)(Sitting on toilet) Pants- Performed by patient: Thread/unthread right pants leg, Thread/unthread left pants leg, Pull pants up/down Pants- Performed by helper: Thread/unthread right pants leg, Thread/unthread left pants leg, Pull pants up/down Non-skid slipper socks- Performed by helper: Don/doff right sock, Don/doff left sock Assist for footwear: Maximal assist Assist for lower body dressing: Touching or steadying assistance (Pt > 75%)  Function - Toileting Toileting activity did not occur: N/A Toileting steps completed by patient: Adjust clothing prior to toileting, Performs perineal hygiene, Adjust clothing after toileting Toileting steps completed by helper: Performs perineal hygiene Toileting Assistive Devices: Grab bar or rail Assist level: Touching or steadying assistance (Pt.75%)  Function - ArchivistToilet Transfers Toilet transfer activity did not occur: N/A Toilet transfer assistive device: Grab bar, Walker Assist level to toilet: Supervision or verbal cues Assist level from toilet: Supervision or verbal cues  Function - Chair/bed transfer Chair/bed transfer activity did not occur: N/A Chair/bed transfer method: Ambulatory, Stand pivot Chair/bed transfer assist level: Touching or steadying assistance (Pt > 75%) Chair/bed transfer assistive  device: Armrests, Walker Chair/bed transfer details: Verbal cues for technique, Verbal cues for safe use of DME/AE  Function -  Locomotion: Wheelchair Type: Manual Max wheelchair distance: 6450ft  Assist Level: Touching or steadying assistance (Pt > 75%) Assist Level: Touching or steadying assistance (Pt > 75%) Wheel 150 feet activity did not occur: Safety/medical concerns Turns around,maneuvers to table,bed, and toilet,negotiates 3% grade,maneuvers on rugs and over doorsills: No Function - Locomotion: Ambulation Assistive device: Walker-rolling Max distance: 125' Assist level: Touching or steadying assistance (Pt > 75%) Assist level: Touching or steadying assistance (Pt > 75%) Assist level: Touching or steadying assistance (Pt > 75%) Walk 150 feet activity did not occur: Safety/medical concerns Walk 10 feet on uneven surfaces activity did not occur: Safety/medical concerns Assist level: Touching or steadying assistance (Pt > 75%)  Function - Comprehension Comprehension: Auditory Comprehension assist level: Follows complex conversation/direction with extra time/assistive device  Function - Expression Expression: Verbal Expression assist level: Expresses complex ideas: With extra time/assistive device  Function - Social Interaction Social Interaction assist level: Interacts appropriately with others with medication or extra time (anti-anxiety, antidepressant).  Function - Problem Solving Problem solving assist level: Solves basic problems with no assist  Function - Memory Memory assist level: More than reasonable amount of time Patient normally able to recall (first 3 days only): Current season, That he or she is in a hospital, Location of own room, Staff names and faces  Medical Problem List and Plan: 1.  Gait abnormality and limitations in self-care secondary to cervical myelopathy.   Cont CIR PT, OT, SLP  2.  DVT Prophylaxis/Anticoagulation: Mechanical: Sequential compression  devices, below knee Bilateral lower extremities, non pharmacologic due to cervical surgery 3. Pain Management: Will continue oxycodone prn. Add voltaren gel additionally with ice/heat for local measures.  4. Mood: LCSW to follow for evaluation and support.  5. Neuropsych: This patient is capable of making decisions on her own behalf. 6. Skin/Wound Care: monitor wound for healing. Add nutritional supplement to promote healing 7. Fluids/Electrolytes/Nutrition: Monitor I/O. Check lytes in am.  8. A fib: Monitor heart rate bid--on amiodarone at bedtime with toprol XL bid. Xarelto on hold, HR remains controlled 11/24 Vitals:   06/05/17 0500 06/05/17 0558  BP: (!) 98/52 135/64  Pulse: (!) 56 (!) 56  Resp: 16   Temp: 97.6 F (36.4 C)   SpO2: 97%    9. HTN: Monitor BP bid. Continue norvasc and metoprolol,   labile will monitor for trend 10 CKD: Encourage fluid intake. Monitor renal status   Cr 1.07 on 11/20   Follow up next week 11. RLS: Managed with requip at nights and xanax prn 12. GERD: Continue protonix. 13. Seasonal allergies: On claritin and flonase--uses albuterol prn for wheezing.  14.  Chronic LBP with radiculopathy: Unable to tolerated gabapentin or tramadol. Will continue to use local measures .Oxy IR more helpful than tramadol but causing some drowsiness cont to monitor 14. Chronic constipation: Uses suppository daily. Added miralax due to reports of hard stools and now on narcotics.    Increased bowel reg again on 11/17   Improving 15. Chronic mild hyponatremia: Encourage intake.    Bmet normal on 06/01/2017 16.  Dysphagia due to swelling at operative site should gradually improve   Cont to monitor 17. Hemorrhoids  Tucks pads ordered 11/17   No significant bleeding, hemoglobin has been stable   LOS (Days) 10 A FACE TO FACE EVALUATION WAS PERFORMED  SWARTZ,ZACHARY T 06/05/2017, 12:36 PM

## 2017-06-05 NOTE — Progress Notes (Signed)
Occupational Therapy Session Note  Patient Details  Name: [name] MRN: [id number] Date of Birth: Oct 17, 1927  Today's Date: 06/05/2017 OT Individual Time: 1003-1100 OT Individual Time Calculation (min): 57 min    Short Term Goals: Week 1:  OT Short Term Goal 1 (Week 1): Pt will complete 3/3 toileting tasks with steadying assist OT Short Term Goal 2 (Week 1): Pt will complete grooming task standing at sink with CGA in order to increase functional standing balance/endurance OT Short Term Goal 3 (Week 1): Pt will correctly use AE PRN during dressing tasks independently  Skilled Therapeutic Interventions/Progress Updates:    1:1. Pt completes bathing and dressing at shower level. Pt ambulates with RW to gather clothes with VC for using dresser to stabilize for reaching. Pt bathes at sit to stand level with set up utilizing LHSS to wash B feet and back. Pt dresses seated on BSC in shower with VC to pull pants from back of waisteband as R foot gets stuck in pant leg. Pt able to reach B feet to don socks with foot elevated on stool with VC for positioning. Pt stands at sink to brush teeth with VC for not leaning on sink to stabilize hips to challenge balance. Exited session with pt seated in w/c with call light in reach and all needs met.   Therapy Documentation Precautions:  Precautions Precautions: Fall, Cervical Required Braces or Orthoses: Cervical Brace Cervical Brace: Soft collar, At all times Restrictions Weight Bearing Restrictions: No General:   Vital Signs:   See Function Navigator for Current Functional Status.   Therapy/Group: Individual Therapy  Tonny Branch 06/05/2017, 12:12 PM

## 2017-06-06 ENCOUNTER — Inpatient Hospital Stay (HOSPITAL_COMMUNITY): Payer: Medicare Other

## 2017-06-06 ENCOUNTER — Inpatient Hospital Stay (HOSPITAL_COMMUNITY): Payer: Medicare Other | Admitting: Physical Therapy

## 2017-06-06 NOTE — Progress Notes (Signed)
Physical Therapy Discharge Summary  Patient Details  Name: [name] MRN: [id number] Date of Birth: November 01, 1927  Today's Date: 06/06/2017 PT Individual Time: 1500-1600 PT Individual Time Calculation (min): 60 min    Pt in recliner upon arrival and agreeable to therapy, no c/o pain. Worked on functional mobility as detailed below in discharge summary. Pt reporting increased BLE stiffness today that may be limiting her mobility today. Performed 10 minutes on NuStep at end of session to facilitate decreased LE stiffness and work on functional strength. Returned to room Total A in w/c for time management and pt requesting to toilet, ambulated to/from toilet w/ supervision. Ended session in recliner, call bell within reach and all needs met.   Patient has met 8 of 9 long term goals due to improved activity tolerance, improved balance, improved postural control, increased strength, decreased pain and improved coordination.  Patient to discharge at an ambulatory level Supervision.   Patient's care partner is independent to provide the necessary physical assistance at discharge.  Reasons goals not met: Bed<>chair transfers requiring supervision assist this session 2/2 BLE stiffness decreasing ease and fluidity of movement.   Recommendation:  Patient will benefit from ongoing skilled PT services in home health setting to continue to advance safe functional mobility, address ongoing impairments in balance, LE strength (R>L), coordination, quality of gait, and minimize fall risk.  Equipment: No equipment provided  Reasons for discharge: treatment goals met and discharge from hospital  Patient/family agrees with progress made and goals achieved: Yes  PT Discharge Precautions/Restrictions Precautions Precautions: Fall;Cervical Required Braces or Orthoses: Cervical Brace Cervical Brace: Soft collar;At all times Restrictions Weight Bearing Restrictions: No Pain Pain Assessment Pain  Assessment: No/denies pain Vision/Perception  Perception Perception: Within Functional Limits Praxis Praxis: Intact  Cognition Overall Cognitive Status: Within Functional Limits for tasks assessed Arousal/Alertness: Awake/alert Orientation Level: Oriented X4 Memory: Appears intact Awareness: Appears intact Problem Solving: Appears intact Safety/Judgment: Appears intact Sensation Sensation Light Touch: Impaired by gross assessment(diminished R side in general) Proprioception: Appears Intact Coordination Gross Motor Movements are Fluid and Coordinated: Yes Fine Motor Movements are Fluid and Coordinated: No Coordination and Movement Description: increased shakiness w/ fatigue Finger Nose Finger Test: overshoots, decreased speed Heel Shin Test: WNL Motor  Motor Motor: Tetraplegia Motor - Skilled Clinical Observations: Incomplete tetraplegia C3-C7 R>L, generalized weakness  Mobility Bed Mobility Bed Mobility: Rolling Right;Rolling Left;Supine to Sit;Sit to Supine Rolling Right: 6: Modified independent (Device/Increase time) Rolling Left: 6: Modified independent (Device/Increase time) Supine to Sit: 6: Modified independent (Device/Increase time) Sit to Supine: 6: Modified independent (Device/Increase time) Transfers Transfers: Yes Sit to Stand: 6: Modified independent (Device/Increase time) Stand Pivot Transfers: 5: Supervision Locomotion  Ambulation Ambulation: Yes Ambulation/Gait Assistance: 5: Supervision Ambulation Distance (Feet): 150 Feet Assistive device: Rolling walker Ambulation/Gait Assistance Details: Verbal cues for gait pattern Gait Gait: Yes Gait Pattern: Impaired Gait Pattern: Step-to pattern;Shuffle;Decreased hip/knee flexion - right;Decreased dorsiflexion - right Gait velocity: dereased Stairs / Additional Locomotion Stairs: Yes Stairs Assistance: 4: Min guard Stair Management Technique: Two rails;Step to pattern Number of Stairs: 4 Height of Stairs:  6 Wheelchair Mobility Wheelchair Mobility: Yes Wheelchair Assistance: 5: Investment banker, operational Details: Verbal cues for Marketing executive: Both upper extremities;Left lower extremity Wheelchair Parts Management: Supervision/cueing Distance: 150'  Trunk/Postural Assessment  Cervical Assessment Cervical Assessment: Exceptions to [facility] limited 2/2 soft collar) Thoracic Assessment Thoracic Assessment: Exceptions to WFL(kyphosis) Lumbar Assessment Lumbar Assessment: Within Functional Limits Postural Control Postural Control: Deficits on evaluation(delayed righting and protective responses)  Balance Balance Balance Assessed: Yes Dynamic Sitting Balance Dynamic Sitting - Balance Support: Feet supported;During functional activity Dynamic Sitting - Level of Assistance: 6: Modified independent (Device/Increase time) Sitting balance - Comments: Sitting EOB Dynamic Standing Balance Dynamic Standing - Balance Support: During functional activity;Right upper extremity supported Dynamic Standing - Level of Assistance: 5: Stand by assistance Extremity Assessment  RLE Assessment RLE Assessment: Exceptions to [facility] LLE Assessment LLE Assessment: Within Functional Limits   See Function Navigator for Current Functional Status.  Sigifredo Pignato K Arnette 06/06/2017, 4:22 PM

## 2017-06-06 NOTE — Discharge Instructions (Addendum)
Inpatient Rehab Discharge Instructions  Lisa GamblerDorothy W Solomon Discharge date and time: 06/07/17   Activities/Precautions/ Functional Status: Activity: no lifting, driving, or strenuous exercise for till cleared by MD Diet: regular diet Wound Care: keep wound clean and dry Contact MD if you develop any problems with your incision/wound--redness, swelling, increase in pain, drainage or if you develop fever or chills.     Functional status:  ___ No restrictions     ___ Walk up steps independently _X_ 24/7 supervision/assistance   ___ Walk up steps with assistance ___ Intermittent supervision/assistance  ___ Bathe/dress independently ___ Walk with walker     ___ Bathe/dress with assistance ___ Walk Independently    ___ Shower independently ___ Walk with assistance    ___ Shower with assistance _X__ No alcohol     ___ Return to work/school ________   Special Instructions: 1. No driving.    COMMUNITY REFERRALS UPON DISCHARGE:   Home Health:   PT, OT, RN  Agency:[facility]   [phone]   Date of last service:06/07/2017  Medical Equipment/Items Ordered:NO NEEDS   Information on my medicine - XARELTO (Rivaroxaban)  This medication education was reviewed with me or my healthcare representative as part of my discharge preparation.  The pharmacist that spoke with me during my hospital stay was:  Adline PotterSabrina Solomon, [facility]  Why was Xarelto prescribed for you? Xarelto was prescribed for you to reduce the risk of a blood clot forming that can cause a stroke if you have a medical condition called atrial fibrillation (a type of irregular heartbeat).  What do you need to know about xarelto ? Take your Xarelto ONCE DAILY at the same time every day with your evening meal. If you have difficulty swallowing the tablet whole, you may crush it and mix in applesauce just prior to taking your dose.  Take Xarelto exactly as prescribed by your doctor and DO NOT stop taking Xarelto without  talking to the doctor who prescribed the medication.  Stopping without other stroke prevention medication to take the place of Xarelto may increase your risk of developing a clot that causes a stroke.  Refill your prescription before you run out.  After discharge, you should have regular check-up appointments with your healthcare provider that is prescribing your Xarelto.  In the future your dose may need to be changed if your kidney function or weight changes by a significant amount.  What do you do if you miss a dose? If you are taking Xarelto ONCE DAILY and you miss a dose, take it as soon as you remember on the same day then continue your regularly scheduled once daily regimen the next day. Do not take two doses of Xarelto at the same time or on the same day.   Important Safety Information A possible side effect of Xarelto is bleeding. You should call your healthcare provider right away if you experience any of the following: ? Bleeding from an injury or your nose that does not stop. ? Unusual colored urine (red or dark Kleier) or unusual colored stools (red or black). ? Unusual bruising for unknown reasons. ? A serious fall or if you hit your head (even if there is no bleeding).  Some medicines may interact with Xarelto and might increase your risk of bleeding while on Xarelto. To help avoid this, consult your healthcare provider or pharmacist prior to using any new prescription or non-prescription medications, including herbals, vitamins, non-steroidal anti-inflammatory drugs (NSAIDs) and supplements.  This website has more information  on Xarelto: [email or web address].    My questions have been answered and I understand these instructions. I will adhere to these goals and the provided educational materials after my discharge from the hospital.  Patient/Caregiver Signature _______________________________ Date __________  Clinician Signature _______________________________________ Date  __________  Please bring this form and your medication list with you to all your follow-up doctor's appointments.

## 2017-06-06 NOTE — Progress Notes (Signed)
Subjective/Complaints: Slept well.  Denies new problems.  Pain seems controlled  Review of Systems  Constitutional: Negative.   HENT: Negative.   Respiratory: Negative.   Cardiovascular: Negative.   Genitourinary: Negative.   Musculoskeletal: Positive for neck pain.  Skin: Negative.   Neurological: Negative.    or  Objective: Vital Signs: Blood pressure (!) 152/69, pulse 62, temperature 98.5 F (36.9 C), temperature source Oral, resp. rate 16, height 5\' 7"  (1.702 m), weight 81.1 kg (178 lb 12.7 oz), SpO2 100 %. No results found. No results found for this or any previous visit (from the past 72 hour(s)).   HEENT: Incision c/d/i. Normocephalic, atraumatic. Neck: Soft collar in place Cardio: RRR without murmur. No JVD  Resp: CTA Bilaterally without wheezes or rales. Normal effort  GI: BS positive and ND Skin:   Wound C/D/I and IV site clean , no erythema, ecchymosis in upper chest area Neuro: Alert/Oriented Motor 4/5 Right delt, bi, tri, grip,--stable 3- to 3/5 right  HF, KE, 4/5 ADF --stable 5/5 LUE LLE: 4/5 proximal to distal -senses pain and light touch Musc/Skel:  No edema, no tenderness Gen NAD.   Assessment/Plan: 1. Functional deficits secondary to cervical myelopathy  which require 3+ hours per day of interdisciplinary therapy in a comprehensive inpatient rehab setting. Physiatrist is providing close team supervision and 24 hour management of active medical problems listed below. Physiatrist and rehab team continue to assess barriers to discharge/monitor patient progress toward functional and medical goals. FIM: Function - Bathing Bathing activity did not occur: N/A Position: Shower Body parts bathed by patient: Right arm, Chest, Abdomen, Front perineal area, Right upper leg, Left upper leg, Left arm, Right lower leg, Left lower leg, Buttocks Body parts bathed by helper: Back Assist Level: Supervision or verbal cues  Function- Upper Body Dressing/Undressing Upper  body dressing/undressing activity did not occur: N/A What is the patient wearing?: Pull over shirt/dress Pull over shirt/dress - Perfomed by patient: Thread/unthread right sleeve, Put head through opening, Thread/unthread left sleeve, Pull shirt over trunk Pull over shirt/dress - Perfomed by helper: Pull shirt over trunk Button up shirt - Perfomed by patient: Thread/unthread left sleeve Button up shirt - Perfomed by helper: Thread/unthread right sleeve, Pull shirt around back, Button/unbutton shirt Assist Level: Set up Function - Lower Body Dressing/Undressing Lower body dressing/undressing activity did not occur: N/A What is the patient wearing?: Pants, Non-skid slipper socks, AFO, Shoes Position: Other (comment)(BSC) Pants- Performed by patient: Thread/unthread right pants leg, Thread/unthread left pants leg, Pull pants up/down Pants- Performed by helper: Thread/unthread right pants leg, Thread/unthread left pants leg, Pull pants up/down Non-skid slipper socks- Performed by helper: Don/doff right sock, Don/doff left sock Shoes - Performed by patient: Don/doff right shoe, Don/doff left shoe, Fasten right, Fasten left Shoes - Performed by helper: Don/doff right shoe Assist for footwear: Supervision/touching assist(shoe funnel) Assist for lower body dressing: Supervision or verbal cues  Function - Toileting Toileting activity did not occur: N/A Toileting steps completed by patient: Adjust clothing prior to toileting, Performs perineal hygiene, Adjust clothing after toileting Toileting steps completed by helper: Adjust clothing after toileting Toileting Assistive Devices: Grab bar or rail Assist level: Supervision or verbal cues  Function - Archivist transfer activity did not occur: N/A Toilet transfer assistive device: Grab bar, Walker Assist level to toilet: Supervision or verbal cues Assist level from toilet: Supervision or verbal cues  Function - Chair/bed  transfer Chair/bed transfer activity did not occur: N/A Chair/bed transfer method: Ambulatory, Stand pivot Chair/bed  transfer assist level: Touching or steadying assistance (Pt > 75%) Chair/bed transfer assistive device: Armrests, Walker Chair/bed transfer details: Verbal cues for technique, Verbal cues for safe use of DME/AE  Function - Locomotion: Wheelchair Type: Manual Max wheelchair distance: 4750ft  Assist Level: Touching or steadying assistance (Pt > 75%) Assist Level: Touching or steadying assistance (Pt > 75%) Wheel 150 feet activity did not occur: Safety/medical concerns Turns around,maneuvers to table,bed, and toilet,negotiates 3% grade,maneuvers on rugs and over doorsills: No Function - Locomotion: Ambulation Assistive device: Walker-rolling Max distance: 125' Assist level: Touching or steadying assistance (Pt > 75%) Assist level: Touching or steadying assistance (Pt > 75%) Assist level: Touching or steadying assistance (Pt > 75%) Walk 150 feet activity did not occur: Safety/medical concerns Walk 10 feet on uneven surfaces activity did not occur: Safety/medical concerns Assist level: Touching or steadying assistance (Pt > 75%)  Function - Comprehension Comprehension: Auditory Comprehension assist level: Understands complex 90% of the time/cues 10% of the time  Function - Expression Expression: Verbal Expression assist level: Expresses complex ideas: With extra time/assistive device  Function - Social Interaction Social Interaction assist level: Interacts appropriately with others with medication or extra time (anti-anxiety, antidepressant).  Function - Problem Solving Problem solving assist level: Solves basic problems with no assist  Function - Memory Memory assist level: More than reasonable amount of time Patient normally able to recall (first 3 days only): Current season, That he or she is in a hospital, Location of own room, Staff names and faces  Medical  Problem List and Plan: 1.  Gait abnormality and limitations in self-care secondary to cervical myelopathy.   Cont CIR PT, OT, SLP  2.  DVT Prophylaxis/Anticoagulation: Mechanical: Sequential compression devices, below knee Bilateral lower extremities, non pharmacologic due to cervical surgery 3. Pain Management: Will continue oxycodone prn. Add voltaren gel additionally with ice/heat for local measures.  4. Mood: LCSW to follow for evaluation and support.  5. Neuropsych: This patient is capable of making decisions on her own behalf. 6. Skin/Wound Care: monitor wound for healing. Add nutritional supplement to promote healing 7. Fluids/Electrolytes/Nutrition: Monitor I/O. Check lytes in am.  8. A fib: Monitor heart rate bid--on amiodarone at bedtime with toprol XL bid. Xarelto on hold, HR remains controlled 11/24 Vitals:   06/05/17 2103 06/06/17 0520  BP: (!) 147/69 (!) 152/69  Pulse: 75 62  Resp:  16  Temp:  98.5 F (36.9 C)  SpO2:  100%   9. HTN: Monitor BP bid. Continue norvasc and metoprolol,   Improved control overall  10 CKD: Encourage fluid intake. Monitor renal status   Cr 1.07 on 11/20   Follow up next week 11. RLS: Managed with requip at nights and xanax prn 12. GERD: Continue protonix. 13. Seasonal allergies: On claritin and flonase--uses albuterol prn for wheezing.  14.  Chronic LBP with radiculopathy: Unable to tolerated gabapentin or tramadol. Will continue to use local measures .Oxy IR more helpful than tramadol but causing some drowsiness cont to monitor 14. Chronic constipation: Uses suppository daily. Added miralax due to reports of hard stools and now on narcotics.    improved 15. Chronic mild hyponatremia: Encourage intake.    Bmet normal on 06/01/2017 16.  Dysphagia due to swelling at operative site should gradually improve   Cont to monitor 17. Hemorrhoids  Tucks pads ordered 11/17   No significant bleeding, hemoglobin has been stable   LOS (Days) 11 A  FACE TO FACE EVALUATION WAS PERFORMED  SWARTZ,ZACHARY T 06/06/2017,  10:55 AM

## 2017-06-07 ENCOUNTER — Inpatient Hospital Stay (HOSPITAL_COMMUNITY): Payer: Medicare Other | Admitting: Occupational Therapy

## 2017-06-07 ENCOUNTER — Ambulatory Visit (HOSPITAL_COMMUNITY): Payer: Medicare Other | Admitting: Speech Pathology

## 2017-06-07 MED ORDER — METOPROLOL SUCCINATE ER 25 MG PO TB24: 25.0000 mg | ORAL_TABLET | Freq: Two times a day (BID) | ORAL | 0 refills | Status: AC

## 2017-06-07 NOTE — Progress Notes (Signed)
Social Work  Discharge Note  The overall goal for the admission was met for:   Discharge location: [place] [facility]  Length of Stay: Yes-12 DAYS  Discharge activity level: Yes-SUPERVISION-MOD/I LEVEL  Home/community participation: Yes  Services provided included: MD, RD, PT, OT, RN, CM, Pharmacy and SW  Financial Services: Private Insurance: [facility]  Follow-up services arranged: Home Health: [facility] and Patient/Family request agency HH: PREF USED BEFORE, DME: NO NEEDS DECIDED WANTED A TUB SEAT WITH BACK AND AWARE PRIVATE PAY. ORDERED VIA [facility]  Comments (or additional information):PT DID WELL AND GRANDDAUGHTER WAS HERE DAILY AND ATTENDING THERAPIES WITH PT. WILL HAVE 24 [facility] CARE  Patient/Family verbalized understanding of follow-up arrangements: Yes  Individual responsible for coordination of the follow-up plan: WANDA-DAUGHTER  Confirmed correct DME delivered: Elease Hashimoto 06/07/2017    Elease Hashimoto

## 2017-06-07 NOTE — Progress Notes (Signed)
Patient discharged to home accompanied by her daughter. 

## 2017-06-07 NOTE — Discharge Summary (Signed)
Physician Discharge Summary  Patient ID: [name] MRN: [id number] DOB/AGE: 212/09/1927 83 y.o.  Admit date: 05/26/2017 Discharge date: 06/07/2017  Discharge Diagnoses:  Principal Problem:   Cervical myelopathy (HCC) Active Problems:   Hemorrhoids   Constipation due to pain medication   Chronic low back pain with sciatica   Stage 3 chronic kidney disease (HCC)   Benign essential HTN   PAF (paroxysmal atrial fibrillation) (HCC)   Discharged Condition: stable   Significant Diagnostic Studies:  Dg Swallowing Func-speech Pathology  Result Date: 05/31/2017 Objective Swallowing Evaluation: Type of Study: MBS-Modified Barium Swallow Study  Patient Details Name: [name] MRN: [id number] Date of Birth: 10/30/1927 Today's Date: 05/31/2017 Time: SLP Start Time (ACUTE ONLY): 1001 -SLP Stop Time (ACUTE ONLY): 1030 SLP Time Calculation (min) (ACUTE ONLY): 29 min Past Medical History: Past Medical History: Diagnosis Date . Arthritis  . Chronic kidney disease  . Depression  . Dysrhythmia 8/13  palpitations/ now resolved . GERD (gastroesophageal reflux disease)  . History of blood transfusion 2012 . Hypertension  . Peripheral vascular disease (HCC)   varicose veins bilaterally  . Restless legs syndrome  Past Surgical History: Past Surgical History: Procedure Laterality Date . ABDOMINAL HYSTERECTOMY   . ANTERIOR CERVICAL DECOMPRESSION/DISCECTOMY FUSION CERVICAL THREE-FOUR, CERVICAL FIVE-SIX, CERVICAL SIX-SEVEN N/A 05/24/2017  Performed by Julio SicksPool, Henry, [facility] . APPENDECTOMY   . EYE SURGERY Bilateral   cataract extraction with IOL . JOINT REPLACEMENT Right   knee . LEFT TOTAL KNEE ARTHROPLASTY Left 11/07/2012  Performed by Loanne DrillingAluisio, Frank V, MD [facility] . SEPTOPLASTY    repair deviated septum . VARICOSE VEIN SURGERY Bilateral  HPI: 83 y.o. female, with past medical history significant for chronic kidney disease , osteoarthritis , arthritis of the spine  and atrial fibrillation on Eliquis presenting  with 2 weeks history of right-sided numbness and weakness that the patient reports it got worse after his steroid spinal injection done at the orthopedics office. Patient has history of severe osteoarthritis and she had bilateral knee replacements and still have some hip pain. Patient right-sided weakness got worse and now has a right hand contracture.  No Data Recorded Assessment / Plan / Recommendation CHL IP CLINICAL IMPRESSIONS 05/31/2017 Clinical Impression Pt is s/p ACDF with noted pharyngeal swelling around C6 and soft cervical collar in place. Pt presents with mild pharyngeal deficits resulting in mild to moderate residue in vallecula and pyriform sinuses which is cleared with voluntary multiple swallows. With trials of thin liquids via straw pt with 1 episode of sensed mild frank penetration which was cleared during swallow. There was one episode of throat clearing (mild) in response to residue from puree. Cued mutliple swallows and liquid wash helped effectively clear puree residue. Pt consumed diced peaches with functional ability. When given whole pill whole in appleasuae, pt with difficulty clearing pill at the hieght of the larygneal vestibule and needed cues for multiple swallows. Pt independently performs multiple swallows/ double swallows throughout consumption therefor I recommend dysphagia 2 with thin liquids and medicine crushed in applesauce. Intermittent supervision recommended to remind pt of mutliple swallows.  SLP Visit Diagnosis Dysphagia, pharyngeal phase (R13.13) Attention and concentration deficit following -- Frontal lobe and executive function deficit following -- Impact on safety and function Mild aspiration risk   CHL IP TREATMENT RECOMMENDATION 05/31/2017 Treatment Recommendations Therapy as outlined in treatment plan below   No flowsheet data found. CHL IP DIET RECOMMENDATION 05/31/2017 SLP Diet Recommendations Dysphagia 2 (Fine chop) solids;Thin liquid Liquid  Administration via  Spoon;Cup;Straw Medication Administration Crushed with puree Compensations Small sips/bites;Follow solids with liquid;Other (Comment) Postural Changes Remain semi-upright after after feeds/meals (Comment);Seated upright at 90 degrees   CHL IP OTHER RECOMMENDATIONS 05/31/2017 Recommended Consults -- Oral Care Recommendations Oral care BID Other Recommendations --   CHL IP FOLLOW UP RECOMMENDATIONS 05/22/2017 Follow up Recommendations 24 hour supervision/assistance   No flowsheet data found.     CHL IP ORAL PHASE 05/31/2017 Oral Phase WFL Oral - Pudding Teaspoon -- Oral - Pudding Cup -- Oral - Honey Teaspoon -- Oral - Honey Cup -- Oral - Nectar Teaspoon -- Oral - Nectar Cup -- Oral - Nectar Straw -- Oral - Thin Teaspoon -- Oral - Thin Cup -- Oral - Thin Straw -- Oral - Puree -- Oral - Mech Soft -- Oral - Regular -- Oral - Multi-Consistency -- Oral - Pill -- Oral Phase - Comment --  CHL IP PHARYNGEAL PHASE 05/31/2017 Pharyngeal Phase Impaired Pharyngeal- Pudding Teaspoon -- Pharyngeal -- Pharyngeal- Pudding Cup -- Pharyngeal -- Pharyngeal- Honey Teaspoon -- Pharyngeal -- Pharyngeal- Honey Cup -- Pharyngeal -- Pharyngeal- Nectar Teaspoon Pharyngeal residue - valleculae;Pharyngeal residue - pyriform;Other (Comment) Pharyngeal -- Pharyngeal- Nectar Cup Pharyngeal residue - valleculae;Pharyngeal residue - pyriform Pharyngeal -- Pharyngeal- Nectar Straw -- Pharyngeal -- Pharyngeal- Thin Teaspoon Pharyngeal residue - valleculae;Pharyngeal residue - pyriform;Other (Comment) Pharyngeal -- Pharyngeal- Thin Cup Pharyngeal residue - valleculae;Pharyngeal residue - pyriform Pharyngeal -- Pharyngeal- Thin Straw Other (Comment);Pharyngeal residue - valleculae;Pharyngeal residue - pyriform Pharyngeal -- Pharyngeal- Puree Pharyngeal residue - valleculae;Pharyngeal residue - pyriform;Other (Comment) Pharyngeal -- Pharyngeal- Mechanical Soft WFL Pharyngeal -- Pharyngeal- Regular -- Pharyngeal -- Pharyngeal- Multi-consistency WFL  Pharyngeal -- Pharyngeal- Pill Pharyngeal residue - valleculae;Compensatory strategies attempted (with notebox) Pharyngeal -- Pharyngeal Comment --  CHL IP CERVICAL ESOPHAGEAL PHASE 05/31/2017 Cervical Esophageal Phase WFL Pudding Teaspoon -- Pudding Cup -- Honey Teaspoon -- Honey Cup -- Nectar Teaspoon -- Nectar Cup -- Nectar Straw -- Thin Teaspoon -- Thin Cup -- Thin Straw -- Puree -- Mechanical Soft -- Regular -- Multi-consistency -- Pill -- Cervical Esophageal Comment -- CHL IP GO 05/22/2017 Functional Assessment Tool Used NOMS; clinical judgment Functional Limitations Memory Swallow Current Status [phone]) (None) Swallow Goal Status (N6295) (None) Swallow Discharge Status (M8413) (None) Motor Speech Current Status (K4401) (None) Motor Speech Goal Status (U2725) (None) Motor Speech Goal Status (D6644) (None) Spoken Language Comprehension Current Status (I3474) (None) Spoken Language Comprehension Goal Status (Q5956) (None) Spoken Language Comprehension Discharge Status [id number]) (None) Spoken Language Expression Current Status [phone]) (None) Spoken Language Expression Goal Status (J1884) (None) Spoken Language Expression Discharge Status [phone]) (None) Attention Current Status (T0160) (None) Attention Goal Status (F0932) (None) Attention Discharge Status (T5573) (None) Memory Current Status (U2025) CI Memory Goal Status (K2706) CI Memory Discharge Status (G9170) CI Voice Current Status (C3762) (None) Voice Goal Status (G3151) (None) Voice Discharge Status (V6160) (None) Other Speech-Language Pathology Functional Limitation Current Status (V3710) (None) Other Speech-Language Pathology Functional Limitation Goal Status (G2694) (None) Other Speech-Language Pathology Functional Limitation Discharge Status [phone]) (None) Happi Overton 05/31/2017, 1:09 PM                 Labs:  Basic Metabolic Panel: BMP Latest Ref Rng & Units 06/01/2017 05/27/2017 05/26/2017  Glucose 65 - 99 mg/dL 92 703(J) 009(F)  BUN 6 - 20  mg/dL 81(W) 29(H) 37(J)  Creatinine 0.44 - 1.00 mg/dL 6.96(V) 8.93(Y) 1.01(B)  Sodium 135 - 145 mmol/L 136 130(L) 133(L)  Potassium 3.5 - 5.1 mmol/L 3.9 4.3 4.7  Chloride 101 - 111 mmol/L 101 97(L) 100(L)  CO2 22 - 32 mmol/L 28 26 27   Calcium 8.9 - 10.3 mg/dL 0.9(W) 1.1(B) 1.4(N)    CBC: CBC Latest Ref Rng & Units 06/01/2017 05/27/2017 05/26/2017  WBC 4.0 - 10.5 K/uL 5.6 7.6 8.7  Hemoglobin 12.0 - 15.0 g/dL 10.8(L) 11.7(L) 11.8(L)  Hematocrit 36.0 - 46.0 % 33.2(L) 35.4(L) 35.5(L)  Platelets 150 - 400 K/uL 198 168 161    CBG: No results for input(s): GLUCAP in the last 168 hours.  Brief HPI:   [name] a 83 y.o.femalewith history of HTN, CKD, PAF- on Xarelto and amiodarone , DDD with chronic back pain who was admitted on 05/22/17 with 2 week history of progressive right sided weakness and numbness with loss of use of right hand, right foot drop and loss of dexterity left hand. MRI spine done revealing severe cervical spondylosis with diffuse canal stenosis C3/4, C5/6 and C6/7, severe cord compression C3/4, R>L reactive edema due to facet arthritis and multilevel change lumbar spine L2-L5 with severe spinal stenosis L4/5.  She was evaluated by Dr. Jordan Likes and underwent cervical decompression C3- C7 with ACDF on 05/24/17.   She  has had decline in functional status in last 3-4 weeks affecting mobility and ability to carry out ADLs. CIR was recommended for follow up therapy.     Hospital Course: [name] was admitted to rehab 05/26/2017 for inpatient therapies to consist of PT, ST and OT at least three hours five days a week. Past admission physiatrist, therapy team and rehab RN have worked together to provide customized collaborative inpatient rehab. Blood pressures were monitored on bid basis and have been controlled. Heart rate has been managed with home dose amiodarone and  lower dose metoprolol.  Anterior cervical incision is intact and healing without s/s of infection.  She did report problems swallowing and ST has been working with patient on compensatory strategies.  MBS was done for objective evaluation and revealed  Edema anterior neck is resolving and she was advanced to regular textures.  Acute on chronic renal failure is resolving with improvement in intake. Post op labs revealed ABLA which is being monitored and CBC to be repeated tomorrow. Mild hyponatremia has resolved.   She was started on bowel program to help with chronic constipation. She did have mild bleeding due to inflammation of hemorrhoids and this has resolved with local care and proctofoam cream. She has been educated on need for oral meds in addition to suppository to help manage constipation.     Rehab course: During patient's stay in rehab weekly team conferences were held to monitor patient's progress, set goals and discuss barriers to discharge. At admission, patient required mod assist with basic self care tasks and mobility.  She has had improvement in activity tolerance, balance, postural control, as well as ability to compensate for deficits. She is has had improvement in functional use BUE and BLE with improvement in awareness. She is able to complete ADL tasks with increased time and needs assistance with LB dressing.  She is modified independent for transfers and is able to ambulate 150' with RW and supervision. She is able to climb 4 stairs with min assist. She is able to utilize safe swallow strategies at modified independent level and is tolerating regular diet with thin liquids without s/s of aspiration. No follow up ST needed after discharge. .     Disposition: 01-Home or Self Care  Diet: Regular.   Special Instructions:  1. Wear collar at all times. 2. No driving, lifting items over 5 lbs or strenuous activity.    Discharge Instructions    Ambulatory referral to Physical Medicine Rehab   Complete by:  As directed    1-2 weeks transitional care appt     Discharge  Medication List as of 06/07/2017 11:57 AM    CONTINUE these medications which have CHANGED   Details  metoprolol succinate (TOPROL-XL) 25 MG 24 hr tablet Take 1 tablet (25 mg total) by mouth 2 (two) times daily., Starting Mon 06/07/2017, Normal      CONTINUE these medications which have NOT CHANGED   Details  acetaminophen (TYLENOL) 500 MG tablet Take 500 mg by mouth every 6 (six) hours as needed for pain., Until Discontinued, Historical Med    albuterol (VENTOLIN HFA) 108 (90 Base) MCG/ACT inhaler Inhale 2 puffs daily as needed into the lungs for wheezing or shortness of breath. , Historical Med    ALPRAZolam (XANAX) 0.25 MG tablet Take 0.25 mg at bedtime as needed by mouth for anxiety or sleep. , Starting Mon 09/21/2016, Historical Med    amiodarone (PACERONE) 200 MG tablet Take 200 mg at bedtime by mouth. , Historical Med    amLODipine (NORVASC) 5 MG tablet Take 2.5 mg daily after breakfast by mouth. , Historical Med    bisacodyl (DULCOLAX) 10 MG suppository Place 10 mg daily rectally., Historical Med    cetirizine (ZYRTEC) 10 MG tablet Take 10 mg by mouth daily., Until Discontinued, Historical Med    conjugated estrogens (PREMARIN) vaginal cream Place 1 application at bedtime as needed vaginally., Historical Med    Cyanocobalamin (VITAMIN B-12 IJ) Inject 1 each every 30 (thirty) days as directed., Historical Med    estradiol (ESTRACE) 1 MG tablet Take 1 mg by mouth daily., Until Discontinued, Historical Med    fluticasone (FLONASE) 50 MCG/ACT nasal spray Place 1 spray daily as needed into both nostrils for allergies. , Historical Med    pantoprazole (PROTONIX) 40 MG tablet Take 40 mg by mouth daily., Until Discontinued, Historical Med    rOPINIRole (REQUIP) 0.5 MG tablet Take 1 mg by mouth at bedtime., Historical Med    XARELTO 15 MG TABS tablet Take 15 mg daily by mouth. , Starting Mon 10/26/2016, Historical Med      STOP taking these medications     acebutolol (SECTRAL)  400 MG capsule      aspirin EC 81 MG EC tablet      gabapentin (NEURONTIN) 300 MG capsule      gabapentin (NEURONTIN) 300 MG capsule      Omeprazole-Sodium Bicarbonate (ZEGERID) 20-1100 MG CAPS        Follow-up Information    Le, Thao P, DO. Call on 06/10/2017.   Specialty:  Family Medicine Why:  Appointment @ 3:15 PM Contact information: [address] [place] [address] [phone]        Erick ColaceKirsteins, Andrew E, MD Follow up.   Specialty:  Physical Medicine and Rehabilitation Why:  office will call you with follow up appointment Contact information: [address] [place] [address] [phone]        Julio SicksPool, Henry, MD. Call in 1 day(s).   Specialty:  Neurosurgery Why:  for post op appointment Contact information: [address] [place] [address] [phone]           Signed: Jacquelynn CreeLove, Mahamed Zalewski S 06/08/2017, 9:42 AM

## 2017-06-07 NOTE — Progress Notes (Signed)
Speech Language Pathology Discharge Summary  Patient Details  Name: [name] MRN: [id number] Date of Birth: Mar 12, 1928  Patient has met 1 of 1 long term goals.  Patient to discharge at overall Modified Independent level.   Reasons goals not met: N/A   Clinical Impression/Discharge Summary:  Patient has made excellent gains and has met 1of 1LTG's this reporting period. Currently, patient is consuming regular textures with thin liquids with minimal overt s/s of aspiration and is overall Mod I for use of swallowing compensatory strategies. Patient education is complete and patient will discharge home with f/u services not warranted at this time.   Care Partner:  Caregiver Able to Provide Assistance: Yes     Recommendation:  None      Equipment: N/A   Reasons for discharge: Treatment goals met;Discharged from hospital   Patient/Family Agrees with Progress Made and Goals Achieved: Yes     [place], [place] 06/07/2017, 7:02 AM

## 2017-06-07 NOTE — Progress Notes (Signed)
Subjective/Complaints: No swallowing issues with breakfast  Review of Systems  Constitutional: Negative.   HENT: Negative.   Respiratory: Negative.   Cardiovascular: Negative.   Genitourinary: Negative.   Musculoskeletal: Positive for neck pain.  Skin: Negative.   Neurological: Negative.    or  Objective: Vital Signs: Blood pressure 128/66, pulse (!) 58, temperature 97.7 F (36.5 C), temperature source Oral, resp. rate 18, height 5\' 7"  (1.702 m), weight 83.1 kg (183 lb 3.2 oz), SpO2 95 %. No results found. No results found for this or any previous visit (from the past 72 hour(s)).   HEENT: Incision c/d/i. Normocephalic, atraumatic. Neck: Soft collar in place Cardio: RRR without murmur. No JVD  Resp: CTA Bilaterally without wheezes or rales. Normal effort  GI: BS positive and ND Skin:   Wound C/D/I and IV site clean , no erythema, ecchymosis in upper chest area Neuro: Alert/Oriented Motor 4/5 Right delt, bi, tri, grip,--stable 3- to 3/5 right  HF, KE, 4/5 ADF --stable 5/5 LUE LLE: 4/5 proximal to distal Sit to stand with walker and supervision -senses pain and light touch Musc/Skel:  No edema, no tenderness Gen NAD.   Assessment/Plan: 1. Functional deficits secondary to cervical myelopathy  which require 3+ hours per day of interdisciplinary therapy in a comprehensive inpatient rehab setting. Physiatrist is providing close team supervision and 24 hour management of active medical problems listed below. Physiatrist and rehab team continue to assess barriers to discharge/monitor patient progress toward functional and medical goals. FIM: Function - Bathing Bathing activity did not occur: N/A Position: Shower Body parts bathed by patient: Right arm, Chest, Abdomen, Front perineal area, Right upper leg, Left upper leg, Left arm, Right lower leg, Left lower leg, Buttocks, Back Body parts bathed by helper: Back Assist Level: More than reasonable time  Function- Upper Body  Dressing/Undressing Upper body dressing/undressing activity did not occur: N/A What is the patient wearing?: Pull over shirt/dress, Button up shirt Pull over shirt/dress - Perfomed by patient: Thread/unthread right sleeve, Put head through opening, Thread/unthread left sleeve, Pull shirt over trunk Pull over shirt/dress - Perfomed by helper: Pull shirt over trunk Button up shirt - Perfomed by patient: Thread/unthread right sleeve, Thread/unthread left sleeve, Pull shirt around back(leaves shirt open) Button up shirt - Perfomed by helper: Thread/unthread right sleeve, Pull shirt around back, Button/unbutton shirt Assist Level: More than reasonable time Function - Lower Body Dressing/Undressing Lower body dressing/undressing activity did not occur: N/A What is the patient wearing?: Pants, Non-skid slipper socks, AFO, Shoes Position: Other (comment)(BSC) Pants- Performed by patient: Thread/unthread right pants leg, Thread/unthread left pants leg, Pull pants up/down Pants- Performed by helper: Thread/unthread right pants leg, Thread/unthread left pants leg, Pull pants up/down Non-skid slipper socks- Performed by helper: Don/doff right sock, Don/doff left sock Shoes - Performed by patient: Don/doff right shoe, Don/doff left shoe, Fasten right, Fasten left Shoes - Performed by helper: Don/doff right shoe AFO - Performed by patient: Don/doff right AFO Assist for footwear: Independent(shoe funnel) Assist for lower body dressing: More than reasonable time  Function - Toileting Toileting activity did not occur: N/A Toileting steps completed by patient: Adjust clothing prior to toileting, Performs perineal hygiene, Adjust clothing after toileting Toileting steps completed by helper: Adjust clothing after toileting Toileting Assistive Devices: Grab bar or rail Assist level: More than reasonable time  Function - Archivist transfer activity did not occur: N/A Toilet transfer assistive  device: Grab bar, Walker Assist level to toilet: No Help, no cues, assistive device, takes  more than a reasonable amount of time Assist level from toilet: No Help, no cues, assistive device, takes more than a reasonable amount of time  Function - Chair/bed transfer Chair/bed transfer activity did not occur: N/A Chair/bed transfer method: Ambulatory, Stand pivot Chair/bed transfer assist level: Supervision or verbal cues Chair/bed transfer assistive device: Armrests, Walker Chair/bed transfer details: Verbal cues for precautions/safety  Function - Locomotion: Wheelchair Will patient use wheelchair at discharge?: No Type: Manual Max wheelchair distance: 150' Assist Level: Supervision or verbal cues Assist Level: Supervision or verbal cues Wheel 150 feet activity did not occur: Safety/medical concerns Assist Level: Supervision or verbal cues Turns around,maneuvers to table,bed, and toilet,negotiates 3% grade,maneuvers on rugs and over doorsills: No Function - Locomotion: Ambulation Assistive device: Walker-rolling, Orthosis Max distance: 150' Assist level: Supervision or verbal cues Assist level: Supervision or verbal cues Assist level: Supervision or verbal cues Walk 150 feet activity did not occur: Safety/medical concerns Assist level: Supervision or verbal cues Walk 10 feet on uneven surfaces activity did not occur: Safety/medical concerns Assist level: Touching or steadying assistance (Pt > 75%)  Function - Comprehension Comprehension: Auditory Comprehension assist level: Follows basic conversation/direction with no assist  Function - Expression Expression: Verbal Expression assist level: Expresses basic needs/ideas: With no assist  Function - Social Interaction Social Interaction assist level: Interacts appropriately with others - No medications needed.  Function - Problem Solving Problem solving assist level: Solves basic problems with no assist  Function -  Memory Memory assist level: More than reasonable amount of time Patient normally able to recall (first 3 days only): Current season, That he or she is in a hospital, Location of own room, Staff names and faces  Medical Problem List and Plan: 1.  Gait abnormality and limitations in self-care secondary to cervical myelopathy.   Cont CIR PT, OT, SLP- plan d/c 11/30  2.  DVT Prophylaxis/Anticoagulation: Mechanical: Sequential compression devices, below knee Bilateral lower extremities,on  Xarelto 3. Pain Management: Will continue oxycodone prn. Add voltaren gel additionally with ice/heat for local measures.  4. Mood: LCSW to follow for evaluation and support.  5. Neuropsych: This patient is capable of making decisions on her own behalf. 6. Skin/Wound Care: monitor wound for healing. Add nutritional supplement to promote healing 7. Fluids/Electrolytes/Nutrition: Monitor I/O. Check lytes in am.  8. A fib: Monitor heart rate bid--on amiodarone at bedtime with toprol XL bid. Xarelto since 11/19, HR remains controlled 11/26 Vitals:   06/06/17 2134 06/07/17 0438  BP: 134/61 128/66  Pulse: 70 (!) 58  Resp:  18  Temp:  97.7 F (36.5 C)  SpO2:  95%   9. HTN: Monitor BP bid. Continue norvasc and metoprolol,   Controlled 11/26 10 CKD: Encourage fluid intake. Monitor renal status   Cr 1.07 on 11/20    11. RLS: Managed with requip at nights and xanax prn 12. GERD: Continue protonix. 13. Seasonal allergies: On claritin and flonase--uses albuterol prn for wheezing.  14.  Chronic LBP with radiculopathy: Unable to tolerated gabapentin or tramadol. Will continue to use local measures .Oxy IR more helpful than tramadol but causing some drowsiness cont to monitor 14. Chronic constipation: Uses suppository daily. Added miralax due to reports of hard stools and now on narcotics.    improved  16.  Dysphagia due to swelling at operative site should gradually improve   Cont to monitor 17.  Hemorrhoids  Tucks pads ordered 11/17   No significant bleeding, hemoglobin has been stable   LOS (Days) 12  A FACE TO FACE EVALUATION WAS PERFORMED  Erick Colacendrew E Kirsteins 06/07/2017, 8:30 AM

## 2017-06-08 ENCOUNTER — Telehealth: Payer: Self-pay

## 2017-06-08 NOTE — Telephone Encounter (Signed)
Transitional Care call  Patient name: [name](                       Marjarie Wonders                                   ) DOB: (                              July 25, 1927     ) 1. Are you/is patient experiencing any problems since coming home? (                        no    ) a. Are there any questions regarding any aspect of care? ( no                        ) 2. Are there any questions regarding medications administration/dosing? (                  no          ) a. Are meds being taken as prescribed? (               yes             ) b. "Patient should review meds with caller to confirm"  3. Have there been any falls? (         no             ) 4. Has Home Health been to the house and/or have they contacted you? (                  Patient is waiting on PT to call          ) a. If not, have you tried to contact them? (       yes                       ) b. Can we help you contact them? (                no               ) 5. Are bowels and bladder emptying properly? (           yes             ) a. Are there any unexpected incontinence issues? (        no                          ) b. If applicable, is patient following bowel/bladder programs? (  N/A                       ) 6. Any fevers, problems with breathing, unexpected pain? (     no                           ) 7. Are there any skin problems or new areas of breakdown? (  no                                 )  8. Has the patient/family member arranged specialty MD follow up (ie cardiology/neurology/renal/surgical/etc.)?  (                Family is working on it                   ) a. Can we help arrange? (              no                       ) 9. Does the patient need any other services or support that we can help arrange? (             no            ) 10. Are caregivers following through as expected in assisting the patient? (  Patient has family taking turns staying with her so that she always has someone there                            ) 11. Has the  patient quit smoking, drinking alcohol, or using drugs as recommended? (             yes           )  Appointment date/time (      06/15/17                             ),  arrive time (            12:45pm                        )  and who it is with here Research scientist (physical sciences)(    Kirsteins                                   ) [address]

## 2017-06-15 ENCOUNTER — Encounter: Payer: Self-pay | Admitting: Physical Medicine & Rehabilitation

## 2017-06-15 ENCOUNTER — Encounter: Payer: Medicare Other | Attending: Physical Medicine & Rehabilitation

## 2017-06-15 ENCOUNTER — Ambulatory Visit: Payer: Medicare Other | Admitting: Physical Medicine & Rehabilitation

## 2017-06-15 VITALS — BP 163/78 | HR 70

## 2017-06-15 DIAGNOSIS — Z791 Long term (current) use of non-steroidal anti-inflammatories (NSAID): Secondary | ICD-10-CM | POA: Diagnosis not present

## 2017-06-15 DIAGNOSIS — M4712 Other spondylosis with myelopathy, cervical region: Secondary | ICD-10-CM

## 2017-06-15 DIAGNOSIS — I480 Paroxysmal atrial fibrillation: Secondary | ICD-10-CM | POA: Insufficient documentation

## 2017-06-15 DIAGNOSIS — Z800 Family history of malignant neoplasm of digestive organs: Secondary | ICD-10-CM | POA: Insufficient documentation

## 2017-06-15 DIAGNOSIS — M5030 Other cervical disc degeneration, unspecified cervical region: Secondary | ICD-10-CM | POA: Diagnosis not present

## 2017-06-15 DIAGNOSIS — Z8249 Family history of ischemic heart disease and other diseases of the circulatory system: Secondary | ICD-10-CM | POA: Insufficient documentation

## 2017-06-15 DIAGNOSIS — R531 Weakness: Secondary | ICD-10-CM

## 2017-06-15 DIAGNOSIS — M48061 Spinal stenosis, lumbar region without neurogenic claudication: Secondary | ICD-10-CM | POA: Insufficient documentation

## 2017-06-15 DIAGNOSIS — G9589 Other specified diseases of spinal cord: Secondary | ICD-10-CM | POA: Insufficient documentation

## 2017-06-15 DIAGNOSIS — Z90710 Acquired absence of both cervix and uterus: Secondary | ICD-10-CM | POA: Insufficient documentation

## 2017-06-15 DIAGNOSIS — Z9889 Other specified postprocedural states: Secondary | ICD-10-CM | POA: Diagnosis not present

## 2017-06-15 DIAGNOSIS — Z825 Family history of asthma and other chronic lower respiratory diseases: Secondary | ICD-10-CM | POA: Diagnosis not present

## 2017-06-15 DIAGNOSIS — Z808 Family history of malignant neoplasm of other organs or systems: Secondary | ICD-10-CM | POA: Diagnosis not present

## 2017-06-15 DIAGNOSIS — I4891 Unspecified atrial fibrillation: Secondary | ICD-10-CM | POA: Diagnosis not present

## 2017-06-15 DIAGNOSIS — K219 Gastro-esophageal reflux disease without esophagitis: Secondary | ICD-10-CM | POA: Insufficient documentation

## 2017-06-15 DIAGNOSIS — Z96653 Presence of artificial knee joint, bilateral: Secondary | ICD-10-CM | POA: Insufficient documentation

## 2017-06-15 DIAGNOSIS — N189 Chronic kidney disease, unspecified: Secondary | ICD-10-CM | POA: Insufficient documentation

## 2017-06-15 DIAGNOSIS — F329 Major depressive disorder, single episode, unspecified: Secondary | ICD-10-CM | POA: Diagnosis not present

## 2017-06-15 DIAGNOSIS — Z801 Family history of malignant neoplasm of trachea, bronchus and lung: Secondary | ICD-10-CM | POA: Diagnosis not present

## 2017-06-15 DIAGNOSIS — I129 Hypertensive chronic kidney disease with stage 1 through stage 4 chronic kidney disease, or unspecified chronic kidney disease: Secondary | ICD-10-CM | POA: Diagnosis not present

## 2017-06-15 DIAGNOSIS — Z823 Family history of stroke: Secondary | ICD-10-CM | POA: Insufficient documentation

## 2017-06-15 NOTE — Progress Notes (Signed)
Subjective:    Patient ID: [name], female    DOB: 03/08/1928, 83 y.o.   MRN: [id number] 83 y.o. female with history of HTN, CKD, PAF- on Xarelto and amiodarone , DDD with chronic back pain who was admitted on 05/22/17 with 2 week history of progressive right sided weakness and numbness with loss of use of right hand, right foot drop and loss of dexterity left hand.   MRI spine done revealing severe cervical spondylosis with diffuse canal stenosis C3/4, C5/6 and C6/7, severe cord compression C3/4,  R> L reactive edema due to facet arthritis and multilevel change lumbar spine L2-L5 with severe spinal stenosis L4/5.  She was evaluated by Dr. Jordan LikesPool and underwent cervical decompression C3- C7 with ACDF on 05/24/17.   Admit date: 05/26/2017 Discharge date: 06/07/2017  HPI Has appt with Dr Jordan LikesPool Has PCP visit this week Mod I dressing and mobility Needs assist with cooking and cleaning Had home health x 1 visit, no other therapy visits since D/C Using walker for ambulation  Patient states her neck pain has been well controlled just with Tylenol No bowel or bladder dysfunction Continues to wear soft collar Pain Inventory Average Pain 5 Pain Right Now 1 My pain is intermittent and sharp  In the last 24 hours, has pain interfered with the following? General activity 8 Relation with others 4 Enjoyment of life 10 What TIME of day is your pain at its worst? morning Sleep (in general) Good  Pain is worse with: standing and some activites Pain improves with: medication Relief from Meds: 6  Mobility walk without assistance use a walker ability to climb steps?  yes do you drive?  no use a wheelchair  Function retired I need assistance with the following:  dressing, bathing, meal prep, household duties and shopping  Neuro/Psych weakness numbness tingling trouble walking spasms  Prior Studies Any changes since last visit?  no  Physicians involved in your care Any changes  since last visit?  no   Family History  Problem Relation Age of Onset  . Heart attack Father   . Stroke Father   . Hypertension Father   . COPD Mother   . Lung cancer Sister   . Uterine cancer Sister   . Colon polyps Daughter   . Colon cancer Unknown    Social History   Socioeconomic History  . Marital status: Widowed    Spouse name: Not on file  . Number of children: Not on file  . Years of education: Not on file  . Highest education level: Not on file  Social Needs  . Financial resource strain: Not on file  . Food insecurity - worry: Not on file  . Food insecurity - inability: Not on file  . Transportation needs - medical: Not on file  . Transportation needs - non-medical: Not on file  Occupational History  . Not on file  Tobacco Use  . Smoking status: Never Smoker  . Smokeless tobacco: Never Used  Substance and Sexual Activity  . Alcohol use: No  . Drug use: No  . Sexual activity: Not on file  Other Topics Concern  . Not on file  Social History Narrative  . Not on file   Past Surgical History:  Procedure Laterality Date  . ABDOMINAL HYSTERECTOMY    . ANTERIOR CERVICAL DECOMP/DISCECTOMY FUSION N/A 05/24/2017   Procedure: ANTERIOR CERVICAL DECOMPRESSION/DISCECTOMY FUSION CERVICAL THREE-FOUR, CERVICAL FIVE-SIX, CERVICAL SIX-SEVEN;  Surgeon: Julio SicksPool, Henry, MD;  Location: [facility] OR;  Service:  Neurosurgery;  Laterality: N/A;  . APPENDECTOMY    . EYE SURGERY Bilateral    cataract extraction with IOL  . JOINT REPLACEMENT Right    knee  . SEPTOPLASTY     repair deviated septum  . TOTAL KNEE ARTHROPLASTY Left 11/07/2012   Procedure: LEFT TOTAL KNEE ARTHROPLASTY;  Surgeon: Loanne DrillingFrank V Aluisio, MD;  Location: [facility];  Service: Orthopedics;  Laterality: Left;  Marland Kitchen. VARICOSE VEIN SURGERY Bilateral    Past Medical History:  Diagnosis Date  . Arthritis   . Chronic kidney disease   . Depression   . Dysrhythmia 8/13   palpitations/ now resolved  . GERD (gastroesophageal reflux  disease)   . History of blood transfusion 2012  . Hypertension   . Peripheral vascular disease (HCC)    varicose veins bilaterally   . Restless legs syndrome    There were no vitals taken for this visit.  Opioid Risk Score:   Fall Risk Score:  `1  Depression screen PHQ 2/9  No flowsheet data found.   Review of Systems  Constitutional: Positive for unexpected weight change.  HENT: Negative.   Eyes: Negative.   Respiratory: Positive for apnea.   Cardiovascular: Negative.   Gastrointestinal: Positive for constipation.  Endocrine: Negative.   Genitourinary: Negative.   Musculoskeletal: Negative.   Skin: Negative.   Allergic/Immunologic: Negative.   Neurological: Negative.   Hematological: Bruises/bleeds easily.  Psychiatric/Behavioral: Negative.   All other systems reviewed and are negative.      Objective:   Physical Exam  Constitutional: She is oriented to person, place, and time. She appears well-developed and well-nourished. No distress.  HENT:  Head: Normocephalic and atraumatic.  Eyes: Conjunctivae and EOM are normal. Pupils are equal, round, and reactive to light.  Neck: No JVD present.  Reduced cervical spine range of motion wearing cervical orthosis. Cervical incision is healing well with Dermabond  Cardiovascular: Normal rate, regular rhythm and normal heart sounds. Exam reveals no friction rub.  No murmur heard. Pulmonary/Chest: Effort normal and breath sounds normal. No respiratory distress. She has no wheezes.  Abdominal: Soft. Bowel sounds are normal. She exhibits no distension. There is no tenderness.  Neurological: She is alert and oriented to person, place, and time.  Skin: Skin is warm and dry. She is not diaphoretic. No erythema.  Psychiatric: She has a normal mood and affect.  Nursing note and vitals reviewed.  Motor strength is 4/5 bilateral deltoid bicep tricep finger flexors and extensors 4/5 in the right hip flexor knee extensor ankle  dorsiflexor 5/5 in the left hip flexor knee extensor ankle dorsiflexor       Assessment & Plan:  1.  Cervical myelopathy without neurogenic bowel or bladder. She still has lower extremity weakness on the right side and upper extremity paresthesias bilaterally and mild upper extremity pain. Recommend home health PT OT.  The OT for [facility] has been sick this week.  PT has not come out either.  We discussed getting another home health agency however family would still like to contact [facility] prior to any changes. Physical medicine and rehabilitation follow-up on as-needed basis  2.  Cervical postlaminectomy and fusion follow-up with neurosurgery next week 3.  History of atrial fibrillation on chronic anticoagulation as well as amiodarone and metoprolol, follow-up with primary care and cardiology.

## 2017-06-15 NOTE — Patient Instructions (Signed)
Please call if you'd like another home health agency contacted

## 2017-06-29 ENCOUNTER — Other Ambulatory Visit: Payer: Self-pay | Admitting: Physical Medicine and Rehabilitation

## 2017-08-29 ENCOUNTER — Inpatient Hospital Stay (HOSPITAL_COMMUNITY)
Admission: EM | Admit: 2017-08-29 | Discharge: 2017-10-11 | DRG: 871 | Disposition: E | Payer: Medicare Other | Attending: Internal Medicine | Admitting: Internal Medicine

## 2017-08-29 ENCOUNTER — Emergency Department (HOSPITAL_COMMUNITY): Payer: Medicare Other

## 2017-08-29 ENCOUNTER — Encounter (HOSPITAL_COMMUNITY): Payer: Self-pay

## 2017-08-29 DIAGNOSIS — J181 Lobar pneumonia, unspecified organism: Secondary | ICD-10-CM | POA: Diagnosis not present

## 2017-08-29 DIAGNOSIS — Z96653 Presence of artificial knee joint, bilateral: Secondary | ICD-10-CM | POA: Diagnosis present

## 2017-08-29 DIAGNOSIS — I739 Peripheral vascular disease, unspecified: Secondary | ICD-10-CM | POA: Diagnosis present

## 2017-08-29 DIAGNOSIS — Z7901 Long term (current) use of anticoagulants: Secondary | ICD-10-CM

## 2017-08-29 DIAGNOSIS — Z9842 Cataract extraction status, left eye: Secondary | ICD-10-CM

## 2017-08-29 DIAGNOSIS — R7989 Other specified abnormal findings of blood chemistry: Secondary | ICD-10-CM

## 2017-08-29 DIAGNOSIS — Z880 Allergy status to penicillin: Secondary | ICD-10-CM

## 2017-08-29 DIAGNOSIS — J9383 Other pneumothorax: Secondary | ICD-10-CM | POA: Diagnosis not present

## 2017-08-29 DIAGNOSIS — Z981 Arthrodesis status: Secondary | ICD-10-CM

## 2017-08-29 DIAGNOSIS — T797XXD Traumatic subcutaneous emphysema, subsequent encounter: Secondary | ICD-10-CM | POA: Diagnosis not present

## 2017-08-29 DIAGNOSIS — I480 Paroxysmal atrial fibrillation: Secondary | ICD-10-CM | POA: Diagnosis present

## 2017-08-29 DIAGNOSIS — Z8249 Family history of ischemic heart disease and other diseases of the circulatory system: Secondary | ICD-10-CM

## 2017-08-29 DIAGNOSIS — Z888 Allergy status to other drugs, medicaments and biological substances status: Secondary | ICD-10-CM

## 2017-08-29 DIAGNOSIS — Z20828 Contact with and (suspected) exposure to other viral communicable diseases: Secondary | ICD-10-CM | POA: Diagnosis present

## 2017-08-29 DIAGNOSIS — B379 Candidiasis, unspecified: Secondary | ICD-10-CM | POA: Diagnosis not present

## 2017-08-29 DIAGNOSIS — Z7189 Other specified counseling: Secondary | ICD-10-CM

## 2017-08-29 DIAGNOSIS — J189 Pneumonia, unspecified organism: Secondary | ICD-10-CM

## 2017-08-29 DIAGNOSIS — N183 Chronic kidney disease, stage 3 unspecified: Secondary | ICD-10-CM | POA: Diagnosis present

## 2017-08-29 DIAGNOSIS — Z79899 Other long term (current) drug therapy: Secondary | ICD-10-CM

## 2017-08-29 DIAGNOSIS — G2581 Restless legs syndrome: Secondary | ICD-10-CM | POA: Diagnosis present

## 2017-08-29 DIAGNOSIS — N179 Acute kidney failure, unspecified: Secondary | ICD-10-CM | POA: Diagnosis not present

## 2017-08-29 DIAGNOSIS — Z66 Do not resuscitate: Secondary | ICD-10-CM | POA: Diagnosis present

## 2017-08-29 DIAGNOSIS — R748 Abnormal levels of other serum enzymes: Secondary | ICD-10-CM | POA: Diagnosis not present

## 2017-08-29 DIAGNOSIS — I248 Other forms of acute ischemic heart disease: Secondary | ICD-10-CM | POA: Diagnosis present

## 2017-08-29 DIAGNOSIS — T797XXA Traumatic subcutaneous emphysema, initial encounter: Secondary | ICD-10-CM

## 2017-08-29 DIAGNOSIS — Z961 Presence of intraocular lens: Secondary | ICD-10-CM | POA: Diagnosis present

## 2017-08-29 DIAGNOSIS — T508X5A Adverse effect of diagnostic agents, initial encounter: Secondary | ICD-10-CM | POA: Diagnosis not present

## 2017-08-29 DIAGNOSIS — I130 Hypertensive heart and chronic kidney disease with heart failure and stage 1 through stage 4 chronic kidney disease, or unspecified chronic kidney disease: Secondary | ICD-10-CM | POA: Diagnosis present

## 2017-08-29 DIAGNOSIS — J982 Interstitial emphysema: Secondary | ICD-10-CM | POA: Diagnosis present

## 2017-08-29 DIAGNOSIS — I5032 Chronic diastolic (congestive) heart failure: Secondary | ICD-10-CM | POA: Diagnosis present

## 2017-08-29 DIAGNOSIS — E876 Hypokalemia: Secondary | ICD-10-CM | POA: Diagnosis not present

## 2017-08-29 DIAGNOSIS — Z886 Allergy status to analgesic agent status: Secondary | ICD-10-CM

## 2017-08-29 DIAGNOSIS — R0902 Hypoxemia: Secondary | ICD-10-CM

## 2017-08-29 DIAGNOSIS — R0602 Shortness of breath: Secondary | ICD-10-CM

## 2017-08-29 DIAGNOSIS — I700 Atherosclerosis of aorta: Secondary | ICD-10-CM | POA: Diagnosis present

## 2017-08-29 DIAGNOSIS — J939 Pneumothorax, unspecified: Secondary | ICD-10-CM | POA: Diagnosis not present

## 2017-08-29 DIAGNOSIS — Z885 Allergy status to narcotic agent status: Secondary | ICD-10-CM

## 2017-08-29 DIAGNOSIS — J154 Pneumonia due to other streptococci: Secondary | ICD-10-CM | POA: Diagnosis present

## 2017-08-29 DIAGNOSIS — I10 Essential (primary) hypertension: Secondary | ICD-10-CM | POA: Diagnosis not present

## 2017-08-29 DIAGNOSIS — Z9841 Cataract extraction status, right eye: Secondary | ICD-10-CM

## 2017-08-29 DIAGNOSIS — B002 Herpesviral gingivostomatitis and pharyngotonsillitis: Secondary | ICD-10-CM | POA: Diagnosis present

## 2017-08-29 DIAGNOSIS — I5033 Acute on chronic diastolic (congestive) heart failure: Secondary | ICD-10-CM | POA: Diagnosis not present

## 2017-08-29 DIAGNOSIS — Z90710 Acquired absence of both cervix and uterus: Secondary | ICD-10-CM | POA: Diagnosis not present

## 2017-08-29 DIAGNOSIS — I361 Nonrheumatic tricuspid (valve) insufficiency: Secondary | ICD-10-CM | POA: Diagnosis not present

## 2017-08-29 DIAGNOSIS — N141 Nephropathy induced by other drugs, medicaments and biological substances: Secondary | ICD-10-CM | POA: Diagnosis not present

## 2017-08-29 DIAGNOSIS — A419 Sepsis, unspecified organism: Principal | ICD-10-CM | POA: Diagnosis present

## 2017-08-29 DIAGNOSIS — E440 Moderate protein-calorie malnutrition: Secondary | ICD-10-CM | POA: Diagnosis not present

## 2017-08-29 DIAGNOSIS — J9601 Acute respiratory failure with hypoxia: Secondary | ICD-10-CM | POA: Diagnosis present

## 2017-08-29 DIAGNOSIS — K219 Gastro-esophageal reflux disease without esophagitis: Secondary | ICD-10-CM | POA: Diagnosis present

## 2017-08-29 DIAGNOSIS — R0600 Dyspnea, unspecified: Secondary | ICD-10-CM

## 2017-08-29 DIAGNOSIS — R778 Other specified abnormalities of plasma proteins: Secondary | ICD-10-CM

## 2017-08-29 DIAGNOSIS — M1991 Primary osteoarthritis, unspecified site: Secondary | ICD-10-CM | POA: Diagnosis present

## 2017-08-29 DIAGNOSIS — R1310 Dysphagia, unspecified: Secondary | ICD-10-CM | POA: Diagnosis present

## 2017-08-29 DIAGNOSIS — J188 Other pneumonia, unspecified organism: Secondary | ICD-10-CM

## 2017-08-29 DIAGNOSIS — Z515 Encounter for palliative care: Secondary | ICD-10-CM | POA: Diagnosis present

## 2017-08-29 DIAGNOSIS — I77810 Thoracic aortic ectasia: Secondary | ICD-10-CM | POA: Diagnosis present

## 2017-08-29 DIAGNOSIS — J9691 Respiratory failure, unspecified with hypoxia: Secondary | ICD-10-CM

## 2017-08-29 DIAGNOSIS — E869 Volume depletion, unspecified: Secondary | ICD-10-CM | POA: Diagnosis present

## 2017-08-29 LAB — URINALYSIS, ROUTINE W REFLEX MICROSCOPIC
Bilirubin Urine: NEGATIVE
Glucose, UA: NEGATIVE mg/dL
Hgb urine dipstick: NEGATIVE
Ketones, ur: 5 mg/dL — AB
Leukocytes, UA: NEGATIVE
Nitrite: NEGATIVE
PROTEIN: 30 mg/dL — AB
SPECIFIC GRAVITY, URINE: 1.031 — AB (ref 1.005–1.030)
pH: 5 (ref 5.0–8.0)

## 2017-08-29 LAB — CBC WITH DIFFERENTIAL/PLATELET
BASOS PCT: 0 %
Basophils Absolute: 0 10*3/uL (ref 0.0–0.1)
Eosinophils Absolute: 0.1 10*3/uL (ref 0.0–0.7)
Eosinophils Relative: 1 %
HEMATOCRIT: 37.8 % (ref 36.0–46.0)
Hemoglobin: 12.5 g/dL (ref 12.0–15.0)
Lymphocytes Relative: 5 %
Lymphs Abs: 0.4 10*3/uL — ABNORMAL LOW (ref 0.7–4.0)
MCH: 29.6 pg (ref 26.0–34.0)
MCHC: 33.1 g/dL (ref 30.0–36.0)
MCV: 89.6 fL (ref 78.0–100.0)
Monocytes Absolute: 0.9 10*3/uL (ref 0.1–1.0)
Monocytes Relative: 10 %
NEUTROS ABS: 7.5 10*3/uL (ref 1.7–7.7)
Neutrophils Relative %: 84 %
Platelets: 160 10*3/uL (ref 150–400)
RBC: 4.22 MIL/uL (ref 3.87–5.11)
RDW: 15.1 % (ref 11.5–15.5)
WBC: 8.8 10*3/uL (ref 4.0–10.5)

## 2017-08-29 LAB — COMPREHENSIVE METABOLIC PANEL
ALT: 30 U/L (ref 14–54)
AST: 46 U/L — AB (ref 15–41)
Albumin: 2.4 g/dL — ABNORMAL LOW (ref 3.5–5.0)
Alkaline Phosphatase: 94 U/L (ref 38–126)
Anion gap: 11 (ref 5–15)
BILIRUBIN TOTAL: 0.9 mg/dL (ref 0.3–1.2)
BUN: 33 mg/dL — AB (ref 6–20)
CHLORIDE: 104 mmol/L (ref 101–111)
CO2: 21 mmol/L — ABNORMAL LOW (ref 22–32)
CREATININE: 1.05 mg/dL — AB (ref 0.44–1.00)
Calcium: 8.3 mg/dL — ABNORMAL LOW (ref 8.9–10.3)
GFR calc Af Amer: 54 mL/min — ABNORMAL LOW (ref >60)
GFR, EST NON AFRICAN AMERICAN: 46 mL/min — AB (ref >60)
Glucose, Bld: 138 mg/dL — ABNORMAL HIGH (ref 65–99)
Potassium: 3.9 mmol/L (ref 3.5–5.1)
Sodium: 136 mmol/L (ref 135–145)
TOTAL PROTEIN: 5.5 g/dL — AB (ref 6.5–8.1)

## 2017-08-29 LAB — BLOOD GAS, ARTERIAL
Acid-base deficit: 3.7 mmol/L — ABNORMAL HIGH (ref 0.0–2.0)
BICARBONATE: 21.5 mmol/L (ref 20.0–28.0)
Drawn by: 221791
FIO2: 100
O2 SAT: 97.9 %
PCO2 ART: 35.1 mmHg (ref 32.0–48.0)
PO2 ART: 123 mmHg — AB (ref 83.0–108.0)
pH, Arterial: 7.384 (ref 7.350–7.450)

## 2017-08-29 LAB — TROPONIN I
TROPONIN I: 0.08 ng/mL — AB (ref <0.03)
TROPONIN I: 0.23 ng/mL — AB (ref <0.03)
TROPONIN I: 0.19 ng/mL — AB (ref <0.03)

## 2017-08-29 LAB — PROCALCITONIN: PROCALCITONIN: 0.4 ng/mL

## 2017-08-29 LAB — I-STAT CG4 LACTIC ACID, ED
LACTIC ACID, VENOUS: 2.93 mmol/L — AB (ref 0.5–1.9)
LACTIC ACID, VENOUS: 1.94 mmol/L — AB (ref 0.5–1.9)

## 2017-08-29 LAB — BRAIN NATRIURETIC PEPTIDE: B NATRIURETIC PEPTIDE 5: 189 pg/mL — AB (ref 0.0–100.0)

## 2017-08-29 LAB — PROTIME-INR
INR: 1.73
Prothrombin Time: 20.1 seconds — ABNORMAL HIGH (ref 11.4–15.2)

## 2017-08-29 LAB — D-DIMER, QUANTITATIVE: D-Dimer, Quant: 1.59 ug/mL-FEU — ABNORMAL HIGH (ref 0.00–0.50)

## 2017-08-29 LAB — INFLUENZA PANEL BY PCR (TYPE A & B)
INFLBPCR: NEGATIVE
Influenza A By PCR: NEGATIVE

## 2017-08-29 MED ORDER — SODIUM CHLORIDE 0.9 % IV SOLN
1.0000 g | Freq: Two times a day (BID) | INTRAVENOUS | Status: DC
  Administered 2017-08-29 – 2017-09-06 (×16): 1 g via INTRAVENOUS
  Filled 2017-08-29 (×19): qty 1

## 2017-08-29 MED ORDER — LORATADINE 10 MG PO TABS
10.0000 mg | ORAL_TABLET | Freq: Every day | ORAL | Status: DC
  Administered 2017-08-29 – 2017-08-31 (×3): 10 mg via ORAL
  Filled 2017-08-29 (×4): qty 1

## 2017-08-29 MED ORDER — ONDANSETRON HCL 4 MG PO TABS: 4.0000 mg | ORAL_TABLET | Freq: Four times a day (QID) | ORAL | Status: DC | PRN

## 2017-08-29 MED ORDER — SODIUM CHLORIDE 0.9 % IV SOLN
1.0000 g | Freq: Once | INTRAVENOUS | Status: AC
  Administered 2017-08-29: 1 g via INTRAVENOUS
  Filled 2017-08-29: qty 10

## 2017-08-29 MED ORDER — BISACODYL 10 MG RE SUPP
10.0000 mg | Freq: Every day | RECTAL | Status: DC
  Administered 2017-08-30 – 2017-09-07 (×6): 10 mg via RECTAL
  Filled 2017-08-29 (×15): qty 1

## 2017-08-29 MED ORDER — ACETAMINOPHEN 650 MG RE SUPP: 650.0000 mg | Freq: Four times a day (QID) | RECTAL | Status: DC | PRN

## 2017-08-29 MED ORDER — LACTATED RINGERS IV SOLN
INTRAVENOUS | Status: AC
  Administered 2017-08-29: 18:00:00 via INTRAVENOUS

## 2017-08-29 MED ORDER — AMIODARONE HCL 200 MG PO TABS
200.0000 mg | ORAL_TABLET | Freq: Every day | ORAL | Status: DC
  Administered 2017-08-29 – 2017-08-31 (×3): 200 mg via ORAL
  Filled 2017-08-29 (×3): qty 1

## 2017-08-29 MED ORDER — SODIUM CHLORIDE 0.9 % IV BOLUS (SEPSIS)
500.0000 mL | Freq: Once | INTRAVENOUS | Status: AC
  Administered 2017-08-29: 500 mL via INTRAVENOUS

## 2017-08-29 MED ORDER — IOPAMIDOL (ISOVUE-370) INJECTION 76%
80.0000 mL | Freq: Once | INTRAVENOUS | Status: AC | PRN
  Administered 2017-08-29: 80 mL via INTRAVENOUS

## 2017-08-29 MED ORDER — SODIUM CHLORIDE 0.9 % IV SOLN
500.0000 mg | Freq: Once | INTRAVENOUS | Status: AC
  Administered 2017-08-29: 500 mg via INTRAVENOUS
  Filled 2017-08-29: qty 500

## 2017-08-29 MED ORDER — METHYLPREDNISOLONE SODIUM SUCC 125 MG IJ SOLR
125.0000 mg | Freq: Once | INTRAMUSCULAR | Status: AC
  Administered 2017-08-29: 125 mg via INTRAVENOUS
  Filled 2017-08-29: qty 2

## 2017-08-29 MED ORDER — METHYLPREDNISOLONE SODIUM SUCC 125 MG IJ SOLR
60.0000 mg | Freq: Three times a day (TID) | INTRAMUSCULAR | Status: DC
  Administered 2017-08-29 – 2017-08-30 (×3): 60 mg via INTRAVENOUS
  Filled 2017-08-29 (×3): qty 2

## 2017-08-29 MED ORDER — ROPINIROLE HCL 1 MG PO TABS
1.0000 mg | ORAL_TABLET | Freq: Every day | ORAL | Status: DC
  Administered 2017-08-29 – 2017-08-31 (×3): 1 mg via ORAL
  Filled 2017-08-29 (×8): qty 1

## 2017-08-29 MED ORDER — ALBUTEROL SULFATE (2.5 MG/3ML) 0.083% IN NEBU
5.0000 mg | INHALATION_SOLUTION | Freq: Once | RESPIRATORY_TRACT | Status: AC
  Administered 2017-08-29: 5 mg via RESPIRATORY_TRACT
  Filled 2017-08-29: qty 6

## 2017-08-29 MED ORDER — IPRATROPIUM-ALBUTEROL 0.5-2.5 (3) MG/3ML IN SOLN
3.0000 mL | Freq: Four times a day (QID) | RESPIRATORY_TRACT | Status: DC
  Administered 2017-08-29 – 2017-08-31 (×7): 3 mL via RESPIRATORY_TRACT
  Filled 2017-08-29 (×6): qty 3

## 2017-08-29 MED ORDER — ESTRADIOL 1 MG PO TABS
1.0000 mg | ORAL_TABLET | Freq: Every day | ORAL | Status: DC
  Administered 2017-08-29 – 2017-08-31 (×3): 1 mg via ORAL
  Filled 2017-08-29 (×7): qty 1

## 2017-08-29 MED ORDER — ENOXAPARIN SODIUM 80 MG/0.8ML ~~LOC~~ SOLN
80.0000 mg | Freq: Two times a day (BID) | SUBCUTANEOUS | Status: DC
  Administered 2017-08-29: 80 mg via SUBCUTANEOUS
  Filled 2017-08-29: qty 0.8

## 2017-08-29 MED ORDER — FLUTICASONE PROPIONATE 50 MCG/ACT NA SUSP
1.0000 | Freq: Every day | NASAL | Status: DC | PRN
  Administered 2017-08-31: 1 via NASAL
  Filled 2017-08-29 (×3): qty 16

## 2017-08-29 MED ORDER — PANTOPRAZOLE SODIUM 40 MG IV SOLR
40.0000 mg | Freq: Two times a day (BID) | INTRAVENOUS | Status: DC
  Administered 2017-08-29 – 2017-08-30 (×3): 40 mg via INTRAVENOUS
  Filled 2017-08-29 (×3): qty 40

## 2017-08-29 MED ORDER — ONDANSETRON HCL 4 MG/2ML IJ SOLN: 4.0000 mg | Freq: Four times a day (QID) | INTRAMUSCULAR | Status: DC | PRN

## 2017-08-29 MED ORDER — SODIUM CHLORIDE 0.9 % IV BOLUS (SEPSIS)
1000.0000 mL | Freq: Once | INTRAVENOUS | Status: AC
  Administered 2017-08-29: 1000 mL via INTRAVENOUS

## 2017-08-29 MED ORDER — DOCUSATE SODIUM 100 MG PO CAPS
100.0000 mg | ORAL_CAPSULE | Freq: Two times a day (BID) | ORAL | Status: DC
  Administered 2017-08-29 – 2017-08-31 (×3): 100 mg via ORAL
  Filled 2017-08-29 (×6): qty 1

## 2017-08-29 MED ORDER — METOPROLOL TARTRATE 25 MG PO TABS
25.0000 mg | ORAL_TABLET | Freq: Two times a day (BID) | ORAL | Status: DC
  Administered 2017-08-29 – 2017-08-31 (×6): 25 mg via ORAL
  Filled 2017-08-29 (×7): qty 1

## 2017-08-29 MED ORDER — ACETAMINOPHEN 325 MG PO TABS
650.0000 mg | ORAL_TABLET | Freq: Four times a day (QID) | ORAL | Status: DC | PRN
  Administered 2017-08-31 – 2017-09-04 (×2): 650 mg via ORAL
  Filled 2017-08-29 (×2): qty 2

## 2017-08-29 MED ORDER — AMLODIPINE BESYLATE 5 MG PO TABS
2.5000 mg | ORAL_TABLET | Freq: Every day | ORAL | Status: DC
  Administered 2017-08-29 – 2017-09-01 (×4): 2.5 mg via ORAL
  Filled 2017-08-29 (×4): qty 1

## 2017-08-29 MED ORDER — ALPRAZOLAM 0.25 MG PO TABS
0.2500 mg | ORAL_TABLET | Freq: Every evening | ORAL | Status: DC | PRN
  Administered 2017-08-31: 0.25 mg via ORAL
  Filled 2017-08-29: qty 1

## 2017-08-29 NOTE — ED Notes (Addendum)
Date and time results received: 08/16/2017 1018  Test: I-stat lactic Critical Value: 2.93  Name of Provider Notified: Ray  Orders Received? Or Actions Taken?: orders placed

## 2017-08-29 NOTE — ED Provider Notes (Signed)
[facility]GENCY DEPARTMENT Provider Note   CSN: [id number] Arrival date & time: 09/09/2017  0944     History   Chief Complaint Chief Complaint  Patient presents with  . Shortness of Breath    HPI [name] is a 84 y.o. female.  HPI  84 yo female ho ckd, pvd, s/p neck surgery November, diagnosed with bronchitis 2-3 weeks.  Course of doxycycline completed abut one week ago, course of prednisone concurent with doxy, completed all meds and called last Monday that still had cough.  Tessalon perrls changed to tussionex.  Continues sob, dry cough, started on tamiflu Thursday due to known exposure to flu.  All had flu shot.  Decreased po due to access and drive, but no vomiting or diarrhea.  Decrease uop.   Primary care [facility] Past Medical History:  Diagnosis Date  . Arthritis   . Chronic kidney disease   . Depression   . Dysrhythmia 8/13   palpitations/ now resolved  . GERD (gastroesophageal reflux disease)   . History of blood transfusion 2012  . Hypertension   . Peripheral vascular disease (HCC)    varicose veins bilaterally   . Restless legs syndrome     Patient Active Problem List   Diagnosis Date Noted  . Hemorrhoids   . Constipation due to pain medication   . Chronic low back pain with sciatica   . Benign essential HTN   . Paroxysmal atrial fibrillation    . Chronic constipation   . Gastroesophageal reflux disease   . RLS (restless legs syndrome)   . DDD (degenerative disc disease), cervical   . Cervical stenosis of spine 05/24/2017  . Cervical myelopathy (HCC) 05/24/2017  . AF (paroxysmal atrial fibrillation) (HCC) 05/24/2017  . Chronic diastolic CHF (congestive heart failure) (HCC) 05/24/2017  . CKD (chronic kidney disease) stage 3, GFR 30-59 ml/min (HCC) 05/24/2017  . Essential hypertension 05/24/2017  . Right sided weakness 05/22/2017  . Neuropathic pain 05/26/2013  . Restless leg syndrome 05/26/2013  . OA  (osteoarthritis) of knee 11/07/2012    Past Surgical History:  Procedure Laterality Date  . ABDOMINAL HYSTERECTOMY    . ANTERIOR CERVICAL DECOMP/DISCECTOMY FUSION N/A 05/24/2017   Procedure: ANTERIOR CERVICAL DECOMPRESSION/DISCECTOMY FUSION CERVICAL THREE-FOUR, CERVICAL FIVE-SIX, CERVICAL SIX-SEVEN;  Surgeon: Julio SicksPool, Henry, MD;  Location: [facility] OR;  Service: Neurosurgery;  Laterality: N/A;  . APPENDECTOMY    . EYE SURGERY Bilateral    cataract extraction with IOL  . JOINT REPLACEMENT Right    knee  . SEPTOPLASTY     repair deviated septum  . TOTAL KNEE ARTHROPLASTY Left 11/07/2012   Procedure: LEFT TOTAL KNEE ARTHROPLASTY;  Surgeon: Loanne DrillingFrank V Aluisio, MD;  Location: [facility] ORS;  Service: Orthopedics;  Laterality: Left;  Marland Kitchen. VARICOSE VEIN SURGERY Bilateral     OB History    No data available       Home Medications    Prior to Admission medications   Medication Sig Start Date End Date Taking? Authorizing Provider  acetaminophen (TYLENOL) 500 MG tablet Take 500 mg by mouth every 6 (six) hours as needed for pain.    [provider]  albuterol (VENTOLIN HFA) 108 (90 Base) MCG/ACT inhaler Inhale 2 puffs daily as needed into the lungs for wheezing or shortness of breath.     [provider]  ALPRAZolam Prudy Feeler(XANAX) 0.25 MG tablet Take 0.25 mg at bedtime as needed by mouth for anxiety or sleep.  09/21/16   [provider]  amiodarone (PACERONE) 200 MG tablet Take 200 mg at bedtime by mouth.     [provider]  amLODipine (NORVASC) 5 MG tablet Take 2.5 mg daily after breakfast by mouth.     [provider]  bisacodyl (DULCOLAX) 10 MG suppository Place 10 mg daily rectally.    [provider]  cetirizine (ZYRTEC) 10 MG tablet Take 10 mg by mouth daily.    [provider]  conjugated estrogens (PREMARIN) vaginal cream Place 1 application at bedtime as needed vaginally.    [provider]  Cyanocobalamin (VITAMIN B-12 IJ) Inject 1 each  every 30 (thirty) days as directed.    [provider]  estradiol (ESTRACE) 1 MG tablet Take 1 mg by mouth daily.    [provider]  fluticasone (FLONASE) 50 MCG/ACT nasal spray Place 1 spray daily as needed into both nostrils for allergies.     [provider]  metoprolol succinate (TOPROL-XL) 25 MG 24 hr tablet Take 1 tablet (25 mg total) by mouth 2 (two) times daily. 06/07/17   Love, Evlyn Kanner, PA-C  pantoprazole (PROTONIX) 40 MG tablet Take 40 mg by mouth daily.    [provider]  rOPINIRole (REQUIP) 0.5 MG tablet Take 1 mg by mouth at bedtime.    [provider]  XARELTO 15 MG TABS tablet Take 15 mg daily by mouth.  10/26/16   [provider]    Family History Family History  Problem Relation Age of Onset  . Heart attack Father   . Stroke Father   . Hypertension Father   . COPD Mother   . Lung cancer Sister   . Uterine cancer Sister   . Colon polyps Daughter   . Colon cancer Unknown     Social History Social History   Tobacco Use  . Smoking status: Never Smoker  . Smokeless tobacco: Never Used  Substance Use Topics  . Alcohol use: No  . Drug use: No     Allergies   Gabapentin; Hctz [hydrochlorothiazide]; Lasix [furosemide]; Penicillins; and Tramadol-acetaminophen   Review of Systems Review of Systems  All other systems reviewed and are negative.    Physical Exam Updated Vital Signs BP (!) 88/50 (BP Location: Right Arm)   Pulse 91   Temp (!) 97.1 F (36.2 C) (Axillary)   Resp (!) 24   Ht 1.702 m (5\' 7" )   Wt 77.1 kg (170 lb)   SpO2 (!) 9%   BMI 26.63 kg/m   Physical Exam  Constitutional: She appears well-developed and well-nourished.  HENT:  Head: Normocephalic and atraumatic.  Mucous membranes dry  Eyes: EOM are normal. Pupils are equal, round, and reactive to light.  Cardiovascular: Normal rate.  Pulmonary/Chest: Effort normal.  Decreased breath sounds bilateral bases with rhonchi  Abdominal:  Soft.  Musculoskeletal:       Right lower leg: She exhibits edema. She exhibits no tenderness.       Left lower leg: She exhibits edema. She exhibits no tenderness.  Neurological: She is alert.  Skin: Skin is warm and dry. Capillary refill takes less than 2 seconds. No rash noted.  Nursing note and vitals reviewed.    ED Treatments / Results  Labs (all labs ordered are listed, but only abnormal results are displayed) Labs Reviewed  CULTURE, BLOOD (ROUTINE X 2)  CULTURE, BLOOD (ROUTINE X 2)  COMPREHENSIVE METABOLIC PANEL  CBC WITH DIFFERENTIAL/PLATELET  PROTIME-INR  URINALYSIS, ROUTINE W REFLEX MICROSCOPIC  I-STAT CG4 LACTIC ACID, ED  EKG  EKG Interpretation  Date/Time:  Sunday August 29 2017 09:58:51 EST Ventricular Rate:  89 PR Interval:    QRS Duration: 114 QT Interval:  401 QTC Calculation: 488 R Axis:   -4 Text Interpretation:  Sinus rhythm Low voltage, precordial leads Probable left ventricular hypertrophy Borderline prolonged QT interval Confirmed by Makaelyn Aponte ([id number]) on 08/14/2017 11:19:15 AM       Radiology Dg Chest Port 1 View  Result Date: 08/14/2017 CLINICAL DATA:  Shortness of breath with cough and wheezing as well as weakness. EXAM: PORTABLE CHEST 1 VIEW COMPARISON:  01/25/2017 FINDINGS: Lungs are adequately inflated and demonstrate moderate bilateral patchy mixed interstitial airspace density. Possible cavitary process in the right infrahilar region. No definite effusion. Cardiac silhouette is within normal. Fusion hardware over the cervical spine. Remainder of the exam is unchanged. IMPRESSION: Moderate patchy bilateral mixed interstitial airspace process with possible cavitary process in the right infrahilar region likely due to infection. Recommend follow-up to resolution. Electronically Signed   By: Daniel  Boyle M.D.   On: 09/07/2017 10:44    Procedures Procedures (including critical care time)  Medications Ordered in ED Medications - No data  to display   Initial Impression / Assessment and Plan / ED Course  I have reviewed the triage vital signs and the nursing notes.  Pertinent labs & imaging results that were available during my care of the patient were reviewed by me and considered in my medical decision making (see chart for details).     84  y.o. Female presents with worsening dyspnea and cough.  CXR here with biilateral infiltrates and cavitary lesion.  Patient is normally living alone. Discussed with Dr. Sherryll Burger and will see for admission.  Discussed severity of illness with patient, and daughters, [name] and [name].  They currently are in agreement that the patient wishes aggressive care at this time including intubation.  However, both daughters hold healthcare power of attorney and the patient voices that she would not wish to be on a ventilator for an extended period of time.  1- respiratory failure/diffuse bilateral pneumonia- mildly elevated lactic, will fluid bolus and follow bp and lactic acid.  Concerning that aggressive hydration may hasten/worsen respiratory status in this already significantly compromised patient. 2- elevated troponin- will need to trend, but suspect demand ischemia given low sats. 3- volume depletion- patient with poor po intake for days to weeks and will hydrate Vitals:   08/15/2017 1130 08/13/2017 1202  BP: 114/80   Pulse:    Resp: 16   Temp:    SpO2:  100%    CRITICAL CARE Performed by: Margarita Grizzle Total critical care time: 60 minutes Critical care time was exclusive of separately billable procedures and treating other patients. Critical care was necessary to treat or prevent imminent or life-threatening deterioration. Critical care was time spent personally by me on the following activities: development of treatment plan with patient and/or surrogate as well as nursing, discussions with consultants, evaluation of patient's response to treatment, examination of patient, obtaining history from  patient or surrogate, ordering and performing treatments and interventions, ordering and review of laboratory studies, ordering and review of radiographic studies, pulse oximetry and re-evaluation of patient's condition.   Final Clinical Impressions(s) / ED Diagnoses   Final diagnoses:  Respiratory failure with hypoxia, unspecified chronicity (HCC)  Community acquired pneumonia, unspecified laterality    ED Discharge Orders    None       Margarita Grizzle, MD 09/07/2017 1204

## 2017-08-29 NOTE — ED Notes (Signed)
Date and time results received: February 04, 2018 1249  Test: I-stat lactic Critical Value: 1.94  Name of Provider Notified: Ray  Orders Received? Or Actions Taken?: no new orders at this time.

## 2017-08-29 NOTE — Progress Notes (Signed)
ANTICOAGULATION CONSULT NOTE - Initial Consult  Pharmacy Consult for lovenox Indication: atrial fibrillation  Allergies  Allergen Reactions  . Gabapentin     Cardiac concerns (d/c by cardiologist)  . Hctz [Hydrochlorothiazide]     Dizziness   . Lasix [Furosemide]     fatigue   . Penicillins Hives and Itching  . Tramadol-Acetaminophen     GI upset     Patient Measurements: Height: 5\' 7"  (170.2 cm) Weight: 170 lb (77.1 kg) IBW/kg (Calculated) : 61.6   Vital Signs: Temp: 98.5 F (36.9 C) (02/17 1025) Temp Source: Rectal (02/17 1025) BP: 120/60 (02/17 1500) Pulse Rate: 87 (02/17 1500)  Labs: Recent Labs    May 08, 2018 0954  HGB 12.5  HCT 37.8  PLT 160  LABPROT 20.1*  INR 1.73  CREATININE 1.05*  TROPONINI 0.08*    Estimated Creatinine Clearance: 40.4 mL/min (A) (by C-G formula based on SCr of 1.05 mg/dL (H)).   Medical History: Past Medical History:  Diagnosis Date  . Arthritis   . Chronic kidney disease   . Depression   . Dysrhythmia 8/13   palpitations/ now resolved  . GERD (gastroesophageal reflux disease)   . History of blood transfusion 2012  . Hypertension   . Peripheral vascular disease (HCC)    varicose veins bilaterally   . Restless legs syndrome     Medications:   (Not in a hospital admission)  Assessment: Pharmacy consulted to do lovenox for afib.  Goal of Therapy:   Monitor platelets by anticoagulation protocol: Yes   Plan:  Lovenox 80 mg subq every 12 hours. Monitor labs and s/s of bleeding  Tad MooreSteven C Brett Darko 10-29-2017,3:59 PM

## 2017-08-29 NOTE — ED Notes (Signed)
800 mL of urine noted on bladder scanner, MD Ray notified.

## 2017-08-29 NOTE — ED Triage Notes (Signed)
Ems reports pt lives at home with family and was called out because pt lethargic.  EMS arrived to find pt with 02 sat 72% on room air.  Recently diagnosed with bronchitis.  Wheezing in all fields per ems.  EMS administered albuterol treatment 2.5mg .  Pt presently alert and oriented x 4 but can't keep 02 sat above 85% without NRB.  Reports pt has been on cough syrup with codeine x 2 or 3 weeks.  EMs says they initially were called out for possible stroke because pt had weakness in r leg after she received an epidural steroid injection in her back last Wednesday.  Family told ems pt had the same symptoms the last time she had that procedure.  Pt c/o r hip pain and sob.  CBG 184.

## 2017-08-29 NOTE — H&P (Addendum)
History and Physical    [name] WUJ:[id number] DOB: 07/19/1927 DOA: 09/06/2017  PCP: Lenell Antu, DO   Patient coming from: Home  Chief Complaint: Dyspnea  HPI: [name] is a 84 y.o. female with medical history significant for CKD stage III, GERD, hypertension, paroxysmal atrial fibrillation on Xarelto, and peripheral vascular disease who presented to the emergency department with ongoing cough-as well as shortness of breath over the last 2-3 weeks.  This is despite being treated in the outpatient setting with doxycycline and prednisone.  She completed treatment about 1 week ago and actually felt better at that time but states that approximately 3-4 days ago began to once again have her symptoms return.  She states that not only has her symptoms worsen but this morning she began to feel severe weakness and just a feeling of uneasiness that prompted her to come to the emergency room.  She denies any fever, chills, vomiting, or diarrhea.  She has had some decreased oral intake as a result of her symptoms.   ED Course: In the ED, vital signs were noted to be stable and she was noted to have a lactic acid elevation of 2.93 which has come down to 1.94 on repeat after 1.5 L fluid bolus.  She has been started on azithromycin as well as Rocephin and was given IV methylprednisolone on account of severe hypoxemia.  She was initially noted to be in the mid 80th percentile on 3 L nasal cannula and subsequently increased to the 90th percentile range after nonrebreather mask was placed.  She is in no active respiratory distress and initial chest x-ray demonstrated what appeared to be a cavitary lesion which is now noted to be diffuse multifocal pneumonia on chest CT.  D-dimer was mildly elevated and no PE was noted on the CT chest study.  EKG demonstrates sinus rhythm.  Patient is noted to have a mild troponin elevation but no chest pain noted.  Review of Systems: All others reviewed and otherwise  negative.  Past Medical History:  Diagnosis Date  . Arthritis   . Chronic kidney disease   . Depression   . Dysrhythmia 8/13   palpitations/ now resolved  . GERD (gastroesophageal reflux disease)   . History of blood transfusion 2012  . Hypertension   . Peripheral vascular disease (HCC)    varicose veins bilaterally   . Restless legs syndrome     Past Surgical History:  Procedure Laterality Date  . ABDOMINAL HYSTERECTOMY    . ANTERIOR CERVICAL DECOMP/DISCECTOMY FUSION N/A 05/24/2017   Procedure: ANTERIOR CERVICAL DECOMPRESSION/DISCECTOMY FUSION CERVICAL THREE-FOUR, CERVICAL FIVE-SIX, CERVICAL SIX-SEVEN;  Surgeon: Julio Sicks, MD;  Location: [facility] OR;  Service: Neurosurgery;  Laterality: N/A;  . APPENDECTOMY    . EYE SURGERY Bilateral    cataract extraction with IOL  . JOINT REPLACEMENT Right    knee  . SEPTOPLASTY     repair deviated septum  . TOTAL KNEE ARTHROPLASTY Left 11/07/2012   Procedure: LEFT TOTAL KNEE ARTHROPLASTY;  Surgeon: Loanne Drilling, MD;  Location: [facility];  Service: Orthopedics;  Laterality: Left;  Marland Kitchen VARICOSE VEIN SURGERY Bilateral      reports that  has never smoked. she has never used smokeless tobacco. She reports that she does not drink alcohol or use drugs.  Allergies  Allergen Reactions  . Gabapentin     Cardiac concerns (d/c by cardiologist)  . Hctz [Hydrochlorothiazide]     Dizziness   . Lasix [Furosemide]  fatigue   . Penicillins Hives and Itching  . Tramadol-Acetaminophen     GI upset     Family History  Problem Relation Age of Onset  . Heart attack Father   . Stroke Father   . Hypertension Father   . COPD Mother   . Lung cancer Sister   . Uterine cancer Sister   . Colon polyps Daughter   . Colon cancer Unknown     Prior to Admission medications   Medication Sig Start Date End Date Taking? Authorizing Provider  acetaminophen-codeine (TYLENOL #4) 300-60 MG tablet Take 1 tablet by mouth every 4 (four) hours as needed for pain.    Yes [provider]  albuterol (VENTOLIN HFA) 108 (90 Base) MCG/ACT inhaler Inhale 2 puffs daily as needed into the lungs for wheezing or shortness of breath.    Yes [provider]  ALPRAZolam (XANAX) 0.25 MG tablet Take 0.25 mg at bedtime as needed by mouth for anxiety or sleep.  09/21/16  Yes [provider]  amiodarone (PACERONE) 200 MG tablet Take 200 mg at bedtime by mouth.    Yes [provider]  amLODipine (NORVASC) 5 MG tablet Take 2.5 mg daily after breakfast by mouth.    Yes [provider]  bisacodyl (DULCOLAX) 10 MG suppository Place 10 mg daily rectally.   Yes [provider]  cetirizine (ZYRTEC) 10 MG tablet Take 10 mg by mouth daily.   Yes [provider]  conjugated estrogens (PREMARIN) vaginal cream Place 1 application at bedtime as needed vaginally.   Yes [provider]  Cyanocobalamin (VITAMIN B-12 IJ) Inject 1 each every 30 (thirty) days as directed.   Yes [provider]  docusate sodium (COLACE) 100 MG capsule Take 100 mg by mouth 2 (two) times daily.   Yes [provider]  estradiol (ESTRACE) 1 MG tablet Take 1 mg by mouth daily.   Yes [provider]  fluticasone (FLONASE) 50 MCG/ACT nasal spray Place 1 spray daily as needed into both nostrils for allergies.    Yes [provider]  metoprolol succinate (TOPROL-XL) 25 MG 24 hr tablet Take 1 tablet (25 mg total) by mouth 2 (two) times daily. 06/07/17  Yes Love, Evlyn Kanner, PA-C  metoprolol tartrate (LOPRESSOR) 25 MG tablet Take 1 tablet by mouth 2 (two) times daily. 08/05/17  Yes [provider]  oseltamivir (TAMIFLU) 75 MG capsule Take 1 capsule by mouth daily. 08/26/17  Yes [provider]  pantoprazole (PROTONIX) 40 MG tablet Take 40 mg by mouth daily.   Yes [provider]  rOPINIRole (REQUIP) 0.5 MG tablet Take 1 mg by mouth at bedtime.   Yes [provider]  XARELTO 15 MG TABS  tablet Take 15 mg daily by mouth.  10/26/16  Yes [provider]    Physical Exam: Vitals:   September 01, 2017 1230 09-01-17 1254 09-01-17 1300 2017-09-01 1330  BP: 117/69 117/69 121/74 108/61  Pulse: 97 95 92 86  Resp: 16 18 19 16   Temp:      TempSrc:      SpO2: 100% 100% 100% 100%  Weight:      Height:        Constitutional: NAD, calm, comfortable Vitals:   September 01, 2017 1230 09-01-17 1254 Sep 01, 2017 1300 09-01-2017 1330  BP: 117/69 117/69 121/74 108/61  Pulse: 97 95 92 86  Resp: 16 18 19 16   Temp:      TempSrc:      SpO2: 100% 100% 100% 100%  Weight:      Height:       Eyes: lids and conjunctivae normal ENMT: Mucous membranes are moist.  Neck: normal, supple Respiratory: clear to auscultation bilaterally; no whezing. Normal respiratory effort. No accessory muscle use. On NRB mask 10L. Cardiovascular: Regular rate and rhythm, no murmurs. No extremity edema. Abdomen: no tenderness, no distention. Bowel sounds positive.  Musculoskeletal:  No joint deformity upper and lower extremities.   Skin: no rashes, lesions, ulcers.  Psychiatric: Normal judgment and insight. Alert and oriented x 3. Normal mood.   Labs on Admission: I have personally reviewed following labs and imaging studies  CBC: Recent Labs  Lab 09/01/2017 0954  WBC 8.8  NEUTROABS 7.5  HGB 12.5  HCT 37.8  MCV 89.6  PLT 160   Basic Metabolic Panel: Recent Labs  Lab 08/28/2017 0954  NA 136  K 3.9  CL 104  CO2 21*  GLUCOSE 138*  BUN 33*  CREATININE 1.05*  CALCIUM 8.3*   GFR: Estimated Creatinine Clearance: 40.4 mL/min (A) (by C-G formula based on SCr of 1.05 mg/dL (H)). Liver Function Tests: Recent Labs  Lab 08/17/2017 0954  AST 46*  ALT 30  ALKPHOS 94  BILITOT 0.9  PROT 5.5*  ALBUMIN 2.4*   No results for input(s): LIPASE, AMYLASE in the last 168 hours. No results for input(s): AMMONIA in the last 168 hours. Coagulation Profile: Recent Labs  Lab 08/18/2017 0954  INR 1.73   Cardiac  Enzymes: Recent Labs  Lab 08/28/2017 0954  TROPONINI 0.08*   BNP (last 3 results) No results for input(s): PROBNP in the last 8760 hours. HbA1C: No results for input(s): HGBA1C in the last 72 hours. CBG: No results for input(s): GLUCAP in the last 168 hours. Lipid Profile: No results for input(s): CHOL, HDL, LDLCALC, TRIG, CHOLHDL, LDLDIRECT in the last 72 hours. Thyroid Function Tests: No results for input(s): TSH, T4TOTAL, FREET4, T3FREE, THYROIDAB in the last 72 hours. Anemia Panel: No results for input(s): VITAMINB12, FOLATE, FERRITIN, TIBC, IRON, RETICCTPCT in the last 72 hours. Urine analysis:    Component Value Date/Time   COLORURINE STRAW (A) 05/21/2017 1354   APPEARANCEUR CLEAR 05/21/2017 1354   LABSPEC 1.008 05/21/2017 1354   PHURINE 8.0 05/21/2017 1354   GLUCOSEU NEGATIVE 05/21/2017 1354   HGBUR NEGATIVE 05/21/2017 1354   BILIRUBINUR NEGATIVE 05/21/2017 1354   KETONESUR NEGATIVE 05/21/2017 1354   PROTEINUR NEGATIVE 05/21/2017 1354   UROBILINOGEN 0.2 10/25/2012 1303   NITRITE NEGATIVE 05/21/2017 1354   LEUKOCYTESUR LARGE (A) 05/21/2017 1354    Radiological Exams on Admission: Ct Angio Chest Pe W Or Wo Contrast  Result Date: 09/08/2017 CLINICAL DATA:  Bronchitis 2-3 weeks with completion of antibiotics and continued cough and lethargy. EXAM: CT ANGIOGRAPHY CHEST WITH CONTRAST TECHNIQUE: Multidetector CT imaging of the chest was performed using the standard protocol during bolus administration of intravenous contrast. Multiplanar CT image reconstructions and MIPs were obtained to evaluate the vascular anatomy. CONTRAST:  80mL ISOVUE-370 IOPAMIDOL (ISOVUE-370) INJECTION 76% COMPARISON:  Chest x-ray 08/20/2017 and 01/25/2017 as well as abdominal CT 03/12/2008 FINDINGS: Cardiovascular: Borderline cardiomegaly. Minimal calcified plaque over the left anterior descending and lateral circumflex coronary arteries. Calcified plaque over the thoracic aorta. There is mild ectasia of  the ascending thoracic aorta measuring 3.8 cm in AP diameter. No evidence of dissection. Pulmonary arterial system is well opacified without evidence of emboli. Mediastinum/Nodes: No evidence of mediastinal or hilar adenopathy. Suggestion of a small hiatal hernia. Small collection of air adjacent the left  inferior heart border and few tiny foci of air adjacent the distal esophagus and gastroesophageal junction likely minimal pneumomediastinum. Lungs/Pleura: Lungs are somewhat hypoinflated demonstrate heterogeneous bilateral mixed interstitial airspace process. Small bilateral pleural effusions left greater than right. No cavitary lesion identified. Several air bronchograms. Airways are otherwise within normal. Upper Abdomen: 1.5 cm cyst over the anterior segment right lobe of the liver. Musculoskeletal: Degenerative change of the spine. Review of the MIP images confirms the above findings. IMPRESSION: No evidence of pulmonary embolism. Bilateral patchy heterogeneous mixed interstitial airspace process with small bilateral pleural effusions with left greater than right. Findings likely due to diffuse multifocal pneumonia. Findings suggesting minimal pneumomediastinum as this may be secondary to esophageal injury given a few small foci of air adjacent the esophagus. Mild cardiomegaly.  Mild atherosclerotic coronary artery disease. Ectasia of the ascending thoracic aorta measuring 3.8 cm in AP diameter. Recommend annual imaging followup by CTA or MRA. This recommendation follows 2010 ACCF/AHA/AATS/ACR/ASA/SCA/SCAI/SIR/STS/SVM Guidelines for the Diagnosis and Management of Patients with Thoracic Aortic Disease. Circulation.2010; 121: Z610-R604: [id number]. 1.5 cm liver cyst. Aortic Atherosclerosis (ICD10-I70.0). Critical Value/emergent results were called by telephone at the time of interpretation on 2018-07-12 at 2:33 pm to Dr. Effie ShyWentz, who verbally acknowledged these results. Electronically Signed   By: Elberta Fortisaniel  Boyle M.D.   On:  02019-12-31 14:33   Dg Chest Port 1 View  Result Date: 2018-07-12 CLINICAL DATA:  Shortness of breath with cough and wheezing as well as weakness. EXAM: PORTABLE CHEST 1 VIEW COMPARISON:  01/25/2017 FINDINGS: Lungs are adequately inflated and demonstrate moderate bilateral patchy mixed interstitial airspace density. Possible cavitary process in the right infrahilar region. No definite effusion. Cardiac silhouette is within normal. Fusion hardware over the cervical spine. Remainder of the exam is unchanged. IMPRESSION: Moderate patchy bilateral mixed interstitial airspace process with possible cavitary process in the right infrahilar region likely due to infection. Recommend follow-up to resolution. Electronically Signed   By: Elberta Fortisaniel  Boyle M.D.   On: 02019-12-31 10:44    EKG: Independently reviewed. SR at 89bpm.  Assessment/Plan Principal Problem:   Acute hypoxemic respiratory failure (HCC) Active Problems:   AF (paroxysmal atrial fibrillation) (HCC)   Chronic diastolic CHF (congestive heart failure) (HCC)   CKD (chronic kidney disease) stage 3, GFR 30-59 ml/min (HCC)   Essential hypertension   Multifocal pneumonia   Elevated troponin level    1. Sepsis secondary to multifocal pneumonia.  Continue to monitor repeat lactic acid levels and maintain on empiric treatment with IV Merrem due to penicillin allergy.  MRSA nares.  Urine Legionella and strep pneumonia.  Respiratory panel as well as flu swab pending.  Keep on gentle continuous IV fluid and monitor I's and O's and daily weights. 2. Acute hypoxemic respiratory failure secondary to above.  Continue high levels of oxygen and wean as tolerated.  Continue on IV methylprednisolone 60 mg every 6 hours as well as duo nebs every 6 hours for now. 3. Minimal pneumomediastinum noted on CT-suspicious for esophageal injury.  Keep n.p.o. except for sips and meds at this time.  Repeat CT of the chest without contrast for further evaluation in 1-2 days to  further evaluate this finding.  May need CT surgery evaluation if there appears to be worsening.  Maintain on Protonix IV twice daily. 4. History of paroxysmal atrial fibrillation-currently in SR.  Maintain on full dose Lovenox and hold Xarelto for anticoagulation as well as home amiodarone and metoprolol. 5. Chronic Diastolic CHF.  Currently appears to be euvolemic, but  will maintain on gentle IV fluid on account of sepsis and monitor daily weights as well as strict I's and O's and avoid volume overload. 6. CKD stage III.  Appears to be at her baseline.  Continue to monitor with repeat BMP. 7. Essential hypertension.  Currently well controlled.  Continue to monitor with current medications.   DVT prophylaxis: Hold Xarelto and place on full dose Lovenox due to suspicion for pneumomediastinum Code Status: Partial; no shocks or chest compression, but intubation if needed Family Communication: 2 daughters at bedside Disposition Plan:Treat with IV abx and steroids aggressively Consults called:None Admission status: Inpatient, SDU   Akyra Bouchie Hoover Brunette DO [facility] Pager [phone]  If 7PM-7AM, please contact night-coverage [email or web address] Password [facility]  , 2:51 PM

## 2017-08-29 NOTE — ED Notes (Signed)
CRITICAL VALUE ALERT  Critical Value:  Trop 0.08  Date & Time Notied:  1128, 04/17/18  Provider Notified: Dr. Rosalia Hammersay  Orders Received/Actions taken: No new orders at this time

## 2017-08-29 NOTE — ED Notes (Signed)
On 4L [place] pt pulse ox fell to 86%, placed back on non-rebreather. MD Ray at bedside.

## 2017-08-29 NOTE — Progress Notes (Signed)
Pharmacy Antibiotic Note  [name] is a 84 y.o. female admitted on 08/25/2017 with HCAP.  Pharmacy has been consulted for meropenem dosing.  Plan: Meropenem 1000 mg IV Q12 hours  Monitor labs, cultures, and patient improvement  Height: 5\' 7"  (170.2 cm) Weight: 170 lb (77.1 kg) IBW/kg (Calculated) : 61.6  Temp (24hrs), Avg:97.8 F (36.6 C), Min:97.1 F (36.2 C), Max:98.5 F (36.9 C)  Recent Labs  Lab 08/23/2017 0954 09/05/2017 1014 08/17/2017 1240  WBC 8.8  --   --   CREATININE 1.05*  --   --   LATICACIDVEN  --  2.93* 1.94*    Estimated Creatinine Clearance: 40.4 mL/min (A) (by C-G formula based on SCr of 1.05 mg/dL (H)).    Allergies  Allergen Reactions  . Gabapentin     Cardiac concerns (d/c by cardiologist)  . Hctz [Hydrochlorothiazide]     Dizziness   . Lasix [Furosemide]     fatigue   . Penicillins Hives and Itching  . Tramadol-Acetaminophen     GI upset     Antimicrobials this admission: ceftriaxone 2/17 >> 2/17 Azithromycin 2/17>>2/17 Meropenem 2/17 >>   Dose adjustments this admission: Meropenem renally adjusted to every 12 hours  Microbiology results: 2/17 BCx: pending 2/17 Sputum: pending  2/17 MRSA PCR: pending  Thank you for allowing pharmacy to be a part of this patient's care.  Tad MooreSteven C Anjelina Dung 09/01/2017 4:00 PM

## 2017-08-30 ENCOUNTER — Inpatient Hospital Stay (HOSPITAL_COMMUNITY): Payer: Medicare Other

## 2017-08-30 ENCOUNTER — Other Ambulatory Visit: Payer: Self-pay

## 2017-08-30 DIAGNOSIS — J181 Lobar pneumonia, unspecified organism: Secondary | ICD-10-CM | POA: Diagnosis present

## 2017-08-30 LAB — CBC
HEMATOCRIT: 36.1 % (ref 36.0–46.0)
HEMOGLOBIN: 11.9 g/dL — AB (ref 12.0–15.0)
MCH: 29.2 pg (ref 26.0–34.0)
MCHC: 33 g/dL (ref 30.0–36.0)
MCV: 88.7 fL (ref 78.0–100.0)
Platelets: 162 10*3/uL (ref 150–400)
RBC: 4.07 MIL/uL (ref 3.87–5.11)
RDW: 15.2 % (ref 11.5–15.5)
WBC: 7.4 10*3/uL (ref 4.0–10.5)

## 2017-08-30 LAB — RESPIRATORY PANEL BY PCR
ADENOVIRUS-RVPPCR: NOT DETECTED
Bordetella pertussis: NOT DETECTED
CHLAMYDOPHILA PNEUMONIAE-RVPPCR: NOT DETECTED
CORONAVIRUS 229E-RVPPCR: NOT DETECTED
CORONAVIRUS NL63-RVPPCR: NOT DETECTED
Coronavirus HKU1: NOT DETECTED
Coronavirus OC43: NOT DETECTED
INFLUENZA A-RVPPCR: NOT DETECTED
Influenza B: NOT DETECTED
Metapneumovirus: NOT DETECTED
Mycoplasma pneumoniae: NOT DETECTED
Parainfluenza Virus 1: NOT DETECTED
Parainfluenza Virus 2: NOT DETECTED
Parainfluenza Virus 3: NOT DETECTED
Parainfluenza Virus 4: NOT DETECTED
Respiratory Syncytial Virus: NOT DETECTED
Rhinovirus / Enterovirus: NOT DETECTED

## 2017-08-30 LAB — LACTIC ACID, PLASMA: Lactic Acid, Venous: 1.9 mmol/L (ref 0.5–1.9)

## 2017-08-30 LAB — COMPREHENSIVE METABOLIC PANEL
ALBUMIN: 2.3 g/dL — AB (ref 3.5–5.0)
ALT: 29 U/L (ref 14–54)
ANION GAP: 9 (ref 5–15)
AST: 44 U/L — ABNORMAL HIGH (ref 15–41)
Alkaline Phosphatase: 96 U/L (ref 38–126)
BUN: 28 mg/dL — ABNORMAL HIGH (ref 6–20)
CHLORIDE: 107 mmol/L (ref 101–111)
CO2: 20 mmol/L — AB (ref 22–32)
Calcium: 8.2 mg/dL — ABNORMAL LOW (ref 8.9–10.3)
Creatinine, Ser: 0.84 mg/dL (ref 0.44–1.00)
GFR calc Af Amer: >60 mL/min (ref >60)
GFR calc non Af Amer: >60 mL/min (ref >60)
GLUCOSE: 173 mg/dL — AB (ref 65–99)
POTASSIUM: 3.6 mmol/L (ref 3.5–5.1)
SODIUM: 136 mmol/L (ref 135–145)
Total Bilirubin: 0.6 mg/dL (ref 0.3–1.2)
Total Protein: 5.4 g/dL — ABNORMAL LOW (ref 6.5–8.1)

## 2017-08-30 LAB — TROPONIN I: TROPONIN I: 0.15 ng/mL — AB (ref <0.03)

## 2017-08-30 LAB — CBG MONITORING, ED: GLUCOSE-CAPILLARY: 158 mg/dL — AB (ref 65–99)

## 2017-08-30 LAB — PROCALCITONIN: Procalcitonin: 0.38 ng/mL

## 2017-08-30 LAB — MRSA PCR SCREENING: MRSA by PCR: NEGATIVE

## 2017-08-30 LAB — PROTIME-INR
INR: 1.53
Prothrombin Time: 18.2 seconds — ABNORMAL HIGH (ref 11.4–15.2)

## 2017-08-30 MED ORDER — METHYLPREDNISOLONE SODIUM SUCC 125 MG IJ SOLR
60.0000 mg | Freq: Two times a day (BID) | INTRAMUSCULAR | Status: DC
  Administered 2017-08-31 – 2017-09-01 (×3): 60 mg via INTRAVENOUS
  Filled 2017-08-30 (×3): qty 2

## 2017-08-30 MED ORDER — RIVAROXABAN 20 MG PO TABS
20.0000 mg | ORAL_TABLET | Freq: Every day | ORAL | Status: DC
  Filled 2017-08-30: qty 1

## 2017-08-30 MED ORDER — ENSURE ENLIVE PO LIQD
237.0000 mL | Freq: Two times a day (BID) | ORAL | Status: DC
  Administered 2017-08-31 – 2017-09-11 (×8): 237 mL via ORAL

## 2017-08-30 MED ORDER — DIATRIZOATE MEGLUMINE & SODIUM 66-10 % PO SOLN
ORAL | Status: AC
  Filled 2017-08-30: qty 30

## 2017-08-30 MED ORDER — RIVAROXABAN 15 MG PO TABS
15.0000 mg | ORAL_TABLET | Freq: Every day | ORAL | Status: DC
  Administered 2017-08-30 – 2017-08-31 (×2): 15 mg via ORAL
  Filled 2017-08-30 (×4): qty 1

## 2017-08-30 MED ORDER — PANTOPRAZOLE SODIUM 40 MG IV SOLR
40.0000 mg | INTRAVENOUS | Status: DC
  Administered 2017-08-31 – 2017-09-03 (×4): 40 mg via INTRAVENOUS
  Filled 2017-08-30 (×4): qty 40

## 2017-08-30 NOTE — ED Notes (Signed)
Returned from CT.

## 2017-08-30 NOTE — ED Notes (Addendum)
CRITICAL VALUE ALERT  Critical Value: Troponin 0.15 Date & Time Notied:  08/30/17  Provider Notified:Ortiz Orders Received/Actions taken:None yet

## 2017-08-30 NOTE — ED Notes (Signed)
Patient transported to CT 

## 2017-08-30 NOTE — ED Notes (Signed)
Pt to remain NPO per Dr Sherryll BurgerShah due to repeat imaging order

## 2017-08-30 NOTE — Progress Notes (Signed)
ANTICOAGULATION CONSULT NOTE - Initial Consult  Pharmacy Consult for xarelto Indication: atrial fibrillation-nonvalvular  Allergies  Allergen Reactions  . Gabapentin     Cardiac concerns (d/c by cardiologist)  . Hctz [Hydrochlorothiazide]     Dizziness   . Lasix [Furosemide]     fatigue   . Penicillins Hives and Itching  . Tramadol-Acetaminophen     GI upset     Patient Measurements: Height: 5\' 7"  (170.2 cm) Weight: 170 lb (77.1 kg) IBW/kg (Calculated) : 61.6   Vital Signs: BP: 137/74 (02/18 1230) Pulse Rate: 72 (02/18 1245)  Labs: Recent Labs    07-30-2017 0954 07-30-2017 1530 07-30-2017 2108 08/30/17 0243  HGB 12.5  --   --  11.9*  HCT 37.8  --   --  36.1  PLT 160  --   --  162  LABPROT 20.1*  --   --  18.2*  INR 1.73  --   --  1.53  CREATININE 1.05*  --   --  0.84  TROPONINI 0.08* 0.23* 0.19* 0.15*    Estimated Creatinine Clearance: 50.5 mL/min (by C-G formula based on SCr of 0.84 mg/dL).   Medical History: Past Medical History:  Diagnosis Date  . Arthritis   . Chronic kidney disease   . Depression   . Dysrhythmia 8/13   palpitations/ now resolved  . GERD (gastroesophageal reflux disease)   . History of blood transfusion 2012  . Hypertension   . Peripheral vascular disease (HCC)    varicose veins bilaterally   . Restless legs syndrome     Assessment: Pharmacy consulted to dose xarelto for non-valvular afib. Patient was on xarelto prior to admission of 15 mg daily. Continue home dose.   Goal of Therapy:   Monitor platelets by anticoagulation protocol: Yes   Plan:  Xarelto 15 mg daily with supper. Monitor labs and s/s of bleeding.  Salvatore DecentSteven C Kristina Bertone 08/30/2017,1:44 PM

## 2017-08-30 NOTE — Progress Notes (Signed)
Pt Spo2 dropped while on breathing treatment to 82%, Increased HFNC to 15L and encouraged patient to take deep breaths through nose and out through mouth and patient Spo2 came up to 93% @ 0824.

## 2017-08-30 NOTE — Progress Notes (Signed)
PROGRESS NOTE    MICHIKO LINEMAN  JXB:[id number] DOB: 1927-10-26 DOA: September 06, 2017 PCP: Lenell Antu, DO   Brief Narrative:   This is an 84 year old female with history significant for CKD stage III, GERD, hypertension, paroxysmal atrial fibrillation on Xarelto, and peripheral vascular disease who presented to the emergency with worsening cough and shortness of breath over the last 2-3 weeks.  She has been diagnosed with sepsis secondary to multilobular pneumonia and was initially thought to have some pneumomediastinum, but this appears to have resolved on repeat CT chest today.  Her hypoxemic respiratory failure is responding as well as her sepsis with IV Merrem.  Assessment & Plan:   Principal Problem:   Acute hypoxemic respiratory failure (HCC) Active Problems:   AF (paroxysmal atrial fibrillation) (HCC)   Chronic diastolic CHF (congestive heart failure) (HCC)   CKD (chronic kidney disease) stage 3, GFR 30-59 ml/min (HCC)   Essential hypertension   Multifocal pneumonia   Elevated troponin level   Sepsis (HCC)   Pneumonia   1. Sepsis secondary to multifocal pneumonia-improving.  Continue to monitor repeat lactic acid levels and a.m. labs.  Maintain on IV Merrem for empiric treatment.  No growth and blood cultures noted thus far and respiratory panel currently pending.  I will DC IV fluid at this time and start diet. 2. Acute hypoxemic respiratory failure secondary to above-improving.  Continue high levels of oxygen and wean as tolerated.  Continue on IV methylprednisolone 60 mg to bid today. Duonebs ordered. 3. Minimal pneumomediastinum noted on CT-suspicious for esophageal injury.    Resolved on repeat CT.  Change Protonix to IV daily and start diet and follow course. 4. Ectatic ascending aorta. Repeat imaging in the future to follow course. 5. History of paroxysmal atrial fibrillation-currently in SR.   Full dose Lovenox discontinued and patient resumed on home Xarelto. 6. Chronic  diastolic congestive heart failure.  Continue to monitor I's and O's and daily weights.  IV fluid discontinued. 7. CKD stage III.  Appears to be at her baseline.  Continue to monitor with repeat BMP. 8. Essential hypertension.  Currently well controlled.  Continue to monitor with current medications.    DVT prophylaxis:Eliquis Code Status: Partial; No chest compressions/shocks, but intubation Family Communication: Daughters initially in ED Disposition Plan: TBD   Consultants:   None  Procedures:   None  Antimicrobials:   Merrem 2/17->  Azithromycin and Rocephin on 2/17 in ED   Subjective: Patient seen and evaluated today with no new acute complaints or concerns. No acute concerns or events noted overnight.  She has been weaned from nonrebreather mask to 12 L nasal cannula this morning. Lays flat with no acute respiratory distress noted.  Objective: Vitals:   08/30/17 1200 08/30/17 1215 08/30/17 1230 08/30/17 1245  BP: 140/74  137/74   Pulse: 76 73  72  Resp: (!) 21 20  (!) 31  Temp:      TempSrc:      SpO2: 93% 96%  95%  Weight:      Height:        Intake/Output Summary (Last 24 hours) at 08/30/2017 1303 Last data filed at 09/06/17 1811 Gross per 24 hour  Intake 350 ml  Output 650 ml  Net -300 ml   Filed Weights   September 06, 2017 0954  Weight: 77.1 kg (170 lb)    Examination:  General exam: Appears calm and comfortable  Respiratory system: Clear to auscultation. Respiratory effort normal. On 12L [place]. Cardiovascular system: S1 &  S2 heard, RRR. No JVD, murmurs, rubs, gallops or clicks. No pedal edema. Gastrointestinal system: Abdomen is nondistended, soft and nontender. No organomegaly or masses felt. Normal bowel sounds heard. Central nervous system: Alert and oriented. No focal neurological deficits. Extremities: Symmetric 5 x 5 power. Skin: No rashes, lesions or ulcers Psychiatry: Judgement and insight appear normal. Mood & affect appropriate.     Data  Reviewed: I have personally reviewed following labs and imaging studies  CBC: Recent Labs  Lab 08/17/2017 0954 08/30/17 0243  WBC 8.8 7.4  NEUTROABS 7.5  --   HGB 12.5 11.9*  HCT 37.8 36.1  MCV 89.6 88.7  PLT 160 162   Basic Metabolic Panel: Recent Labs  Lab 08/31/2017 0954 08/30/17 0243  NA 136 136  K 3.9 3.6  CL 104 107  CO2 21* 20*  GLUCOSE 138* 173*  BUN 33* 28*  CREATININE 1.05* 0.84  CALCIUM 8.3* 8.2*   GFR: Estimated Creatinine Clearance: 50.5 mL/min (by C-G formula based on SCr of 0.84 mg/dL). Liver Function Tests: Recent Labs  Lab 09/04/2017 0954 08/30/17 0243  AST 46* 44*  ALT 30 29  ALKPHOS 94 96  BILITOT 0.9 0.6  PROT 5.5* 5.4*  ALBUMIN 2.4* 2.3*   No results for input(s): LIPASE, AMYLASE in the last 168 hours. No results for input(s): AMMONIA in the last 168 hours. Coagulation Profile: Recent Labs  Lab 08/15/2017 0954 08/30/17 0243  INR 1.73 1.53   Cardiac Enzymes: Recent Labs  Lab 08/17/2017 0954 08/20/2017 1530 08/30/2017 2108 08/30/17 0243  TROPONINI 0.08* 0.23* 0.19* 0.15*   BNP (last 3 results) No results for input(s): PROBNP in the last 8760 hours. HbA1C: No results for input(s): HGBA1C in the last 72 hours. CBG: Recent Labs  Lab 08/30/17 0755  GLUCAP 158*   Lipid Profile: No results for input(s): CHOL, HDL, LDLCALC, TRIG, CHOLHDL, LDLDIRECT in the last 72 hours. Thyroid Function Tests: No results for input(s): TSH, T4TOTAL, FREET4, T3FREE, THYROIDAB in the last 72 hours. Anemia Panel: No results for input(s): VITAMINB12, FOLATE, FERRITIN, TIBC, IRON, RETICCTPCT in the last 72 hours. Sepsis Labs: Recent Labs  Lab 08/30/2017 1014 08/27/2017 1240 08/28/2017 1530 08/30/17 0243  PROCALCITON  --   --  0.40 0.38  LATICACIDVEN 2.93* 1.94*  --  1.9    Recent Results (from the past 240 hour(s))  Culture, blood (Routine x 2)     Status: None (Preliminary result)   Collection Time: 08/20/2017  9:56 AM  Result Value Ref Range Status    Specimen Description BLOOD RIGHT HAND  Final   Special Requests   Final    BOTTLES DRAWN AEROBIC AND ANAEROBIC Blood Culture adequate volume   Culture   Final    NO GROWTH < 24 HOURS Performed at [facility], [address] [place], [place] [address]    Report Status PENDING  Incomplete  Culture, blood (Routine x 2)     Status: None (Preliminary result)   Collection Time: 08/15/2017  9:58 AM  Result Value Ref Range Status   Specimen Description RIGHT ANTECUBITAL  Final   Special Requests   Final    BOTTLES DRAWN AEROBIC AND ANAEROBIC Blood Culture adequate volume   Culture   Final    NO GROWTH < 24 HOURS Performed at [facility], [address] [place], [place] [address]    Report Status PENDING  Incomplete         Radiology Studies: Ct Chest Wo Contrast  Result Date: 08/30/2017 CLINICAL DATA:  85 year old female with question esophageal injury with possible pneumomediastinum identified on 08/22/2017 CT. Patient given oral contrast evaluate for extraluminal contrast/esophageal injury. History of neck surgery in 05/2017. Also with cough and lethargy. EXAM: CT CHEST WITHOUT CONTRAST TECHNIQUE: Multidetector CT imaging of the chest was performed following the standard protocol without IV contrast. COMPARISON:  08/23/2017 chest CT and prior studies FINDINGS: Cardiovascular: Cardiomegaly and coronary artery/thoracic aortic atherosclerotic calcifications again noted. Ectatic ascending thoracic aorta measuring 3.8 cm again noted. No pericardial effusion. Mediastinum/Nodes: Oral contrast within the mid and distal esophagus and proximal stomach identified. There is no evidence of extraluminal contrast or mediastinal air on today's study. Shotty mediastinal and bilateral hilar nodes are again noted. Lungs/Pleura: Diffuse ground-glass, interstitial and airspace opacities within both lungs are again noted appear slightly increased. Small bilateral pleural effusions are again noted,  left-greater-than-right. There is no evidence of pneumothorax. Upper Abdomen: No acute abnormality. A small hiatal hernia is noted. Musculoskeletal: No acute or suspicious abnormalities. IMPRESSION: 1. No evidence of extraluminal oral contrast or mediastinal air on today's study. 2. Diffuse ground-glass, interstitial and airspace opacities bilaterally which appear slightly increased. This is nonspecific but may represent diffuse edema and/or infection. 3. Small bilateral pleural effusions, cardiomegaly and small hiatal hernia again noted. 4. Ectatic ascending thoracic aorta again noted measuring 3.8 cm. 5. Coronary artery and aortic Atherosclerosis (ICD10-I70.0). Electronically Signed   By: Harmon Pier M.D.   On: 08/30/2017 10:38   Ct Angio Chest Pe W Or Wo Contrast  Result Date: 08/17/2017 CLINICAL DATA:  Bronchitis 2-3 weeks with completion of antibiotics and continued cough and lethargy. EXAM: CT ANGIOGRAPHY CHEST WITH CONTRAST TECHNIQUE: Multidetector CT imaging of the chest was performed using the standard protocol during bolus administration of intravenous contrast. Multiplanar CT image reconstructions and MIPs were obtained to evaluate the vascular anatomy. CONTRAST:  80mL ISOVUE-370 IOPAMIDOL (ISOVUE-370) INJECTION 76% COMPARISON:  Chest x-ray 09/08/2017 and 01/25/2017 as well as abdominal CT 03/12/2008 FINDINGS: Cardiovascular: Borderline cardiomegaly. Minimal calcified plaque over the left anterior descending and lateral circumflex coronary arteries. Calcified plaque over the thoracic aorta. There is mild ectasia of the ascending thoracic aorta measuring 3.8 cm in AP diameter. No evidence of dissection. Pulmonary arterial system is well opacified without evidence of emboli. Mediastinum/Nodes: No evidence of mediastinal or hilar adenopathy. Suggestion of a small hiatal hernia. Small collection of air adjacent the left inferior heart border and few tiny foci of air adjacent the distal esophagus and  gastroesophageal junction likely minimal pneumomediastinum. Lungs/Pleura: Lungs are somewhat hypoinflated demonstrate heterogeneous bilateral mixed interstitial airspace process. Small bilateral pleural effusions left greater than right. No cavitary lesion identified. Several air bronchograms. Airways are otherwise within normal. Upper Abdomen: 1.5 cm cyst over the anterior segment right lobe of the liver. Musculoskeletal: Degenerative change of the spine. Review of the MIP images confirms the above findings. IMPRESSION: No evidence of pulmonary embolism. Bilateral patchy heterogeneous mixed interstitial airspace process with small bilateral pleural effusions with left greater than right. Findings likely due to diffuse multifocal pneumonia. Findings suggesting minimal pneumomediastinum as this may be secondary to esophageal injury given a few small foci of air adjacent the esophagus. Mild cardiomegaly.  Mild atherosclerotic coronary artery disease. Ectasia of the ascending thoracic aorta measuring 3.8 cm in AP diameter. Recommend annual imaging followup by CTA or MRA. This recommendation follows 2010 ACCF/AHA/AATS/ACR/ASA/SCA/SCAI/SIR/STS/SVM Guidelines for the Diagnosis and Management of Patients with Thoracic Aortic Disease. Circulation.2010; 121: Z610-R604. 1.5 cm liver cyst. Aortic Atherosclerosis (ICD10-I70.0). Critical Value/emergent results were  called by telephone at the time of interpretation on 09/25/17 at 2:33 pm to Dr. Effie Shy, who verbally acknowledged these results. Electronically Signed   By: Elberta Fortis M.D.   On: 09/25/2017 14:33   Dg Chest Port 1 View  Result Date: 09-25-2017 CLINICAL DATA:  Shortness of breath with cough and wheezing as well as weakness. EXAM: PORTABLE CHEST 1 VIEW COMPARISON:  01/25/2017 FINDINGS: Lungs are adequately inflated and demonstrate moderate bilateral patchy mixed interstitial airspace density. Possible cavitary process in the right infrahilar region. No definite  effusion. Cardiac silhouette is within normal. Fusion hardware over the cervical spine. Remainder of the exam is unchanged. IMPRESSION: Moderate patchy bilateral mixed interstitial airspace process with possible cavitary process in the right infrahilar region likely due to infection. Recommend follow-up to resolution. Electronically Signed   By: Elberta Fortis M.D.   On: 2017-09-25 10:44        Scheduled Meds: . amiodarone  200 mg Oral QHS  . amLODipine  2.5 mg Oral QPC breakfast  . bisacodyl  10 mg Rectal Daily  . diatrizoate meglumine-sodium      . docusate sodium  100 mg Oral BID  . estradiol  1 mg Oral Daily  . ipratropium-albuterol  3 mL Nebulization Q6H  . loratadine  10 mg Oral Daily  . methylPREDNISolone (SOLU-MEDROL) injection  60 mg Intravenous Q8H  . metoprolol tartrate  25 mg Oral BID  . pantoprazole (PROTONIX) IV  40 mg Intravenous Q12H  . rOPINIRole  1 mg Oral QHS   Continuous Infusions: . meropenem (MERREM) IV Stopped (08/30/17 0533)     LOS: 1 day    Time spent: 30 minutes    Pratik Hoover Brunette, DO [facility] Pager [phone]  If 7PM-7AM, please contact night-coverage [email or web address] Password [email or web address] 08/30/2017, 1:03 PM

## 2017-08-31 ENCOUNTER — Inpatient Hospital Stay (HOSPITAL_COMMUNITY): Payer: Medicare Other

## 2017-08-31 LAB — CBC WITH DIFFERENTIAL/PLATELET
BASOS ABS: 0 10*3/uL (ref 0.0–0.1)
BASOS PCT: 0 %
Eosinophils Absolute: 0 10*3/uL (ref 0.0–0.7)
Eosinophils Relative: 0 %
HEMATOCRIT: 42.2 % (ref 36.0–46.0)
HEMOGLOBIN: 14 g/dL (ref 12.0–15.0)
Lymphocytes Relative: 11 %
Lymphs Abs: 2 10*3/uL (ref 0.7–4.0)
MCH: 29.6 pg (ref 26.0–34.0)
MCHC: 33.2 g/dL (ref 30.0–36.0)
MCV: 89.2 fL (ref 78.0–100.0)
MONOS PCT: 1 %
Monocytes Absolute: 0.2 10*3/uL (ref 0.1–1.0)
NEUTROS ABS: 16.4 10*3/uL (ref 1.7–7.7)
NEUTROS PCT: 88 %
Platelets: 242 10*3/uL (ref 150–400)
RBC: 4.73 MIL/uL (ref 3.87–5.11)
RDW: 15.4 % (ref 11.5–15.5)
WBC: 18.6 10*3/uL — ABNORMAL HIGH (ref 4.0–10.5)

## 2017-08-31 LAB — COMPREHENSIVE METABOLIC PANEL
ALT: 39 U/L (ref 14–54)
ANION GAP: 14 (ref 5–15)
AST: 39 U/L (ref 15–41)
Albumin: 2.8 g/dL — ABNORMAL LOW (ref 3.5–5.0)
Alkaline Phosphatase: 132 U/L — ABNORMAL HIGH (ref 38–126)
BUN: 42 mg/dL — ABNORMAL HIGH (ref 6–20)
CHLORIDE: 103 mmol/L (ref 101–111)
CO2: 21 mmol/L — AB (ref 22–32)
CREATININE: 0.92 mg/dL (ref 0.44–1.00)
Calcium: 9.1 mg/dL (ref 8.9–10.3)
GFR calc non Af Amer: 54 mL/min — ABNORMAL LOW (ref >60)
Glucose, Bld: 166 mg/dL — ABNORMAL HIGH (ref 65–99)
POTASSIUM: 3.6 mmol/L (ref 3.5–5.1)
SODIUM: 138 mmol/L (ref 135–145)
Total Bilirubin: 0.7 mg/dL (ref 0.3–1.2)
Total Protein: 6.8 g/dL (ref 6.5–8.1)

## 2017-08-31 LAB — BASIC METABOLIC PANEL
Anion gap: 11 (ref 5–15)
BUN: 37 mg/dL — AB (ref 6–20)
CALCIUM: 8.7 mg/dL — AB (ref 8.9–10.3)
CHLORIDE: 104 mmol/L (ref 101–111)
CO2: 20 mmol/L — AB (ref 22–32)
CREATININE: 0.89 mg/dL (ref 0.44–1.00)
GFR calc non Af Amer: 57 mL/min — ABNORMAL LOW (ref >60)
Glucose, Bld: 147 mg/dL — ABNORMAL HIGH (ref 65–99)
Potassium: 3.8 mmol/L (ref 3.5–5.1)
SODIUM: 135 mmol/L (ref 135–145)

## 2017-08-31 LAB — CBC
HEMATOCRIT: 38.4 % (ref 36.0–46.0)
Hemoglobin: 12.6 g/dL (ref 12.0–15.0)
MCH: 29.3 pg (ref 26.0–34.0)
MCHC: 32.8 g/dL (ref 30.0–36.0)
MCV: 89.3 fL (ref 78.0–100.0)
Platelets: 198 10*3/uL (ref 150–400)
RBC: 4.3 MIL/uL (ref 3.87–5.11)
RDW: 15.5 % (ref 11.5–15.5)
WBC: 16.5 10*3/uL — ABNORMAL HIGH (ref 4.0–10.5)

## 2017-08-31 LAB — EXPECTORATED SPUTUM ASSESSMENT W REFEX TO RESP CULTURE

## 2017-08-31 LAB — LACTIC ACID, PLASMA
LACTIC ACID, VENOUS: 2.1 mmol/L — AB (ref 0.5–1.9)
Lactic Acid, Venous: 1.5 mmol/L (ref 0.5–1.9)

## 2017-08-31 LAB — PROCALCITONIN: PROCALCITONIN: 0.28 ng/mL

## 2017-08-31 LAB — GLUCOSE, CAPILLARY: GLUCOSE-CAPILLARY: 229 mg/dL — AB (ref 65–99)

## 2017-08-31 LAB — PHOSPHORUS: PHOSPHORUS: 2.9 mg/dL (ref 2.5–4.6)

## 2017-08-31 LAB — EXPECTORATED SPUTUM ASSESSMENT W GRAM STAIN, RFLX TO RESP C

## 2017-08-31 LAB — TROPONIN I: TROPONIN I: 0.09 ng/mL — AB (ref <0.03)

## 2017-08-31 LAB — MAGNESIUM: Magnesium: 2 mg/dL (ref 1.7–2.4)

## 2017-08-31 LAB — LEGIONELLA PNEUMOPHILA SEROGP 1 UR AG: L. PNEUMOPHILA SEROGP 1 UR AG: NEGATIVE

## 2017-08-31 LAB — BRAIN NATRIURETIC PEPTIDE: B Natriuretic Peptide: 626 pg/mL — ABNORMAL HIGH (ref 0.0–100.0)

## 2017-08-31 MED ORDER — IPRATROPIUM-ALBUTEROL 0.5-2.5 (3) MG/3ML IN SOLN
3.0000 mL | Freq: Three times a day (TID) | RESPIRATORY_TRACT | Status: DC
  Administered 2017-08-31 – 2017-09-12 (×37): 3 mL via RESPIRATORY_TRACT
  Filled 2017-08-31 (×37): qty 3

## 2017-08-31 MED ORDER — NITROGLYCERIN 2 % TD OINT
1.0000 [in_us] | TOPICAL_OINTMENT | Freq: Four times a day (QID) | TRANSDERMAL | Status: AC
  Administered 2017-09-01 (×2): 1 [in_us] via TOPICAL
  Filled 2017-08-31 (×2): qty 1

## 2017-08-31 MED ORDER — ALBUTEROL SULFATE (2.5 MG/3ML) 0.083% IN NEBU
2.5000 mg | INHALATION_SOLUTION | RESPIRATORY_TRACT | Status: DC | PRN
  Administered 2017-09-09: 2.5 mg via RESPIRATORY_TRACT
  Filled 2017-08-31: qty 3

## 2017-08-31 MED ORDER — LORAZEPAM 2 MG/ML IJ SOLN
0.5000 mg | INTRAMUSCULAR | Status: DC | PRN
  Administered 2017-09-01: 0.5 mg via INTRAVENOUS
  Filled 2017-08-31: qty 1

## 2017-08-31 MED ORDER — FUROSEMIDE 10 MG/ML IJ SOLN
40.0000 mg | Freq: Once | INTRAMUSCULAR | Status: AC
  Administered 2017-08-31: 40 mg via INTRAVENOUS
  Filled 2017-08-31: qty 4

## 2017-08-31 NOTE — Progress Notes (Signed)
PT Cancellation Note  Patient Details Name: Lisa GamblerDorothy W Loyer MRN: [id number] DOB: 03/10/1928   Cancelled Treatment:    Reason Eval/Treat Not Completed: Other (comment).  Pt refused the visit, stating she is not well enough to get OOB and will try tomorrow if she can.   Ivar DrapeRuth E Yuka Lallier 08/31/2017, 4:07 PM   Samul Dadauth Simar Pothier, PT MS Acute Rehab Dept. Number: [facility] [phone] and [facility] [phone]

## 2017-08-31 NOTE — Plan of Care (Signed)
  Education: Knowledge of General Education information will improve 08/31/2017 0025 - Progressing by Wynne Dusthomas, Diogo Anne, RN   Clinical Measurements: Ability to maintain clinical measurements within normal limits will improve 08/31/2017 0025 - Progressing by Wynne Dusthomas, Paola Aleshire, RN Will remain free from infection 08/31/2017 0025 - Progressing by Wynne Dusthomas, Anacleto Batterman, RN Diagnostic test results will improve 08/31/2017 0025 - Progressing by Wynne Dusthomas, Ladona Rosten, RN Respiratory complications will improve 08/31/2017 0025 - Progressing by Wynne Dusthomas, Miara Emminger, RN   Activity: Risk for activity intolerance will decrease 08/31/2017 0025 - Progressing by Wynne Dusthomas, Haylie Mccutcheon, RN   Coping: Level of anxiety will decrease 08/31/2017 0025 - Progressing by Wynne Dusthomas, Yi Haugan, RN

## 2017-08-31 NOTE — Progress Notes (Signed)
Patient's oxygen saturation in the low to mid 80's on 10L [place] . Respiratory Therapy in to give patient breathing treatment then patient placed on 100% NRB. Saturation up to 90%.

## 2017-08-31 NOTE — Progress Notes (Signed)
Nutrition Brief Note  Patient identified on the Malnutrition Screening Tool she was erroneously reported as weight loss.   Wt Readings from Last 15 Encounters:  08/31/17 223 lb 12.3 oz (101.5 kg)  06/07/17 183 lb 3.2 oz (83.1 kg)  05/22/17 186 lb 12.8 oz (84.7 kg)  05/26/13 171 lb 12.8 oz (77.9 kg)  11/07/12 176 lb (79.8 kg)  10/25/12 176 lb (79.8 kg)  05/05/06 177 lb (80.3 kg)    Body mass index is 35.05 kg/m. Patient meets criteria for obese based on current BMI.   Current diet order is heart healthy, no meal intake data available at this time. Labs and medications reviewed.   No nutrition interventions warranted at this time. If nutrition issues arise, please consult RD.   Royann ShiversLynn Normand Damron MS,RD,CSG,LDN Office: [phone] Pager: [phone]

## 2017-08-31 NOTE — Plan of Care (Signed)
  Education: Knowledge of General Education information will improve 08/31/2017 2240 - Progressing by Wynne Dusthomas, Hiilei Gerst, RN   Clinical Measurements: Ability to maintain clinical measurements within normal limits will improve 08/31/2017 2240 - Not Progressing by Wynne Dusthomas, Daiana Vitiello, RN Respiratory complications will improve 08/31/2017 2240 - Not Progressing by Wynne Dusthomas, Julizza Sassone, RN   Activity: Risk for activity intolerance will decrease 08/31/2017 2240 - Not Progressing by Wynne Dusthomas, Kemar Pandit, RN   Coping: Level of anxiety will decrease 08/31/2017 2240 - Progressing by Wynne Dusthomas, Graesyn Schreifels, RN   Elimination: Will not experience complications related to urinary retention 08/31/2017 2240 - Progressing by Wynne Dusthomas, Miu Chiong, RN   Pain Managment: General experience of comfort will improve 08/31/2017 2240 - Progressing by Wynne Dusthomas, Yanelly Cantrelle, RN

## 2017-08-31 NOTE — Progress Notes (Signed)
Night shift floor coverage note  The patient was seen due to hypoxia with an oxygen requirement of 8 L/min via nonrebreather mask.  She had been given 40 mg of furosemide earlier by Dr. Selena Batten.  Even with this high flow oxygen treatment, the O2 saturation was about 90-91%.  She was at times tachypneic with a respiratory rate in the high 20s and low 30s.   97.6 F   96  -  21 Abnormal   153/93 Abnormal   Lying  82 % Abnormal   HFNC      General in mild acute distress due to dyspnea. HEENT PERRLA Neck supple, mild JVD Lungs bilateral crackles in both left and right middle and lower lung fields. Cardiovascular irregularly irregular, tachycardic at 102 bpm, no gallop Abdomen soft nontender Extremities 1+ bilateral lower extremities pitting edema. Neuro awake, alert, oriented x3.   Scheduled Meds: . amiodarone  200 mg Oral QHS  . amLODipine  2.5 mg Oral QPC breakfast  . bisacodyl  10 mg Rectal Daily  . docusate sodium  100 mg Oral BID  . estradiol  1 mg Oral Daily  . feeding supplement (ENSURE ENLIVE)  237 mL Oral BID BM  . ipratropium-albuterol  3 mL Nebulization TID  . loratadine  10 mg Oral Daily  . methylPREDNISolone (SOLU-MEDROL) injection  60 mg Intravenous Q12H  . metoprolol tartrate  25 mg Oral BID  . pantoprazole (PROTONIX) IV  40 mg Intravenous Q24H  . rivaroxaban  15 mg Oral Q supper  . rOPINIRole  1 mg Oral QHS   Continuous Infusions: . meropenem (MERREM) IV Stopped (08/31/17 0438)   Report Status PENDING   Comprehensive metabolic panel [[id number]] (Abnormal) Collected: 08/31/17 2201  Updated: 08/31/17 2302   Specimen Type: Blood    Sodium 138 mmol/L   Potassium 3.6 mmol/L   Chloride 103 mmol/L   CO2 21 Abnormally low  mmol/L   Glucose, Bld 166 Abnormally high  mg/dL   BUN 42 Abnormally high  mg/dL   Creatinine, Ser 7.82 mg/dL   Calcium 9.1 mg/dL   Total Protein 6.8 g/dL   Albumin 2.8 Abnormally low  g/dL   AST 39 U/L   ALT 39 U/L   Alkaline Phosphatase 132  Abnormally high  U/L   Total Bilirubin 0.7 mg/dL   GFR calc non Af Amer 54 Abnormally low  mL/min   GFR calc Af Amer >60 mL/min   Anion gap 14  Magnesium [[id number]] Collected: 08/31/17 2201  Updated: 08/31/17 2302   Specimen Type: Blood    Magnesium 2.0 mg/dL  Troponin I [[id number]] (Abnormal) Collected: 08/31/17 2201  Updated: 08/31/17 2302   Specimen Type: Blood    Troponin I 0.09 Critically high   ng/mL  Phosphorus [[id number]] Collected: 08/31/17 2201  Updated: 08/31/17 2302   Specimen Type: Blood    Phosphorus 2.9 mg/dL  CBC with Differential/Platelet [[id number]] (Abnormal) Collected: 08/31/17 2201  Updated: 08/31/17 2301   Specimen Type: Blood    WBC 18.6 Abnormally high  K/uL   RBC 4.73 MIL/uL   Hemoglobin 14.0 g/dL   HCT 64.4 %   MCV 03.4 fL   MCH 29.6 pg   MCHC 33.2 g/dL   RDW 74.2 %   Platelets 242 K/uL   Neutrophils Relative % 88 %   Neutro Abs 16.4 K/uL   Lymphocytes Relative 11 %   Lymphs Abs 2.0 K/uL   Monocytes Relative 1 %   Monocytes Absolute 0.2 K/uL   Eosinophils Relative 0 %  Eosinophils Absolute 0.0 K/uL   Basophils Relative 0 %   Basophils Absolute 0.0 K/uL  Lactic acid, plasma [[id number]][[id number]] (Abnormal) Collected: 08/31/17 2201  Updated: 08/31/17 2259   Specimen Type: Blood    Lactic Acid, Venous 2.1 Critically high   mmol/L  Brain natriuretic peptide [[id number]][[id number]] (Abnormal) Collected: 08/31/17 2201  Updated: 08/31/17 2253   Specimen Type: Blood    B Natriuretic Peptide 626.0 Abnormally high  pg/mL   EKG unavailable at this time.   PORTABLE CHEST 1 VIEW  CLINICAL DATA:  Hypoxia, shortness of breath. Admitted with pneumonia and sepsis.  COMPARISON:  CT chest August 30, 2017 and chest radiograph August 29, 2017  FINDINGS: Diffuse interstitial and alveolar airspace opacities, small suspected pleural effusions. Cardiac silhouette is normal in size. Calcified aortic knob. Fullness of the RIGHT hilum. No  pneumothorax. ACDF.  IMPRESSION: Similar and alveolar airspace opacities concerning for pneumonia. Fullness of the RIGHT hila corresponding to vascular shadows on prior radiograph.  Aortic Atherosclerosis (ICD10-I70.0).   Assessment Acute dyspnea due to unspecified congestive heart failure. Multifocal pneumonia. Acute respiratory failure secondary to above Elevated troponin due to demand ischemia.  Plan: Furosemide 40 mg IVP x1. Nitropaste to ACW. BiPAP ventilation. Trend troponin levels. Check echocardiogram in the morning.  Lisa Kleinavid Lakenya Riendeau, MD  About 55 minutes of critical care time were used during this event.  This document was prepared using Dragon voice recognition software may contain some unintended transcription errors.

## 2017-08-31 NOTE — Progress Notes (Signed)
PROGRESS NOTE    [name]  ZOX:[id number] DOB: 06/04/1928 DOA: 09/08/2017 PCP: Lenell Antu, DO   Brief Narrative:   This is an 84 year old female with history significant for CKD stage III, GERD, hypertension, paroxysmal atrial fibrillation on Xarelto, and peripheral vascular disease who presented to the emergency with worsening cough and shortness of breath over the last 2-3 weeks.  She has been diagnosed with sepsis secondary to multilobular pneumonia and was initially thought to have some pneumomediastinum, but this appears to have resolved on repeat CT chest with gastrografin on 2/18.  Her hypoxemic respiratory failure is responding as well as her sepsis with IV Merrem. She is tolerating her diet, but does not have much of an appetite.  Assessment & Plan:   Principal Problem:   Acute hypoxemic respiratory failure (HCC) Active Problems:   AF (paroxysmal atrial fibrillation) (HCC)   Chronic diastolic CHF (congestive heart failure) (HCC)   CKD (chronic kidney disease) stage 3, GFR 30-59 ml/min (HCC)   Essential hypertension   Multifocal pneumonia   Elevated troponin level   Sepsis (HCC)   Pneumonia   1. Sepsis secondary to multifocal pneumonia-improving.  Continue to monitor repeat lactic acid levels and a.m. labs.  Maintain on IV Merrem for empiric treatment day 3/7.  No growth and blood cultures noted thus far and respiratory panel negative. No need for IVF as she is slightly hypervolemic. Lactic acid downtrending. Sputum culture sample inadequate and therefore, not performed. Will see if gram stain could be obtained. 2. Troponin elevation secondary to demand ischemia. Noted to be downtrending on last check with no chest pain noted. No further need to check at this time. 3. Acute hypoxemic respiratory failure secondary to above-improving.  Continue high levels of oxygen and wean as tolerated. From 12L to 6L today.  Continue on IV methylprednisolone 60 mg BID for now and wean as O2  levels improve. Duonebs ordered. 4. Minimal pneumomediastinum noted on CT-suspicious for esophageal injury.    Resolved on repeat CT.  Continue Protonix IV daily for now with current diet. 5. Weakness. Up to chair with PT evaluation. Strawberry Ensures with diet. 6. Ectatic ascending aorta. Repeat imaging in the future to follow course. 7. History of paroxysmal atrial fibrillation-currently in SR.   Full dose Lovenox discontinued and patient resumed on home Xarelto. 8. Chronic diastolic congestive heart failure.  Continue to monitor I's and O's and daily weights.  IV fluid held. 9. CKD stage III.  Appears to be at her baseline.  Continue to monitor with repeat BMP. 10. Essential hypertension.  Currently well controlled.  Continue to monitor with current medications.    DVT prophylaxis:Xarelto Code Status: Partial; No chest compressions/shocks, but intubation Family Communication: Daughters initially in ED Disposition Plan: TBD; slowly improving. Continue abx and PT eval today.   Consultants:   None  Procedures:   None  Antimicrobials:   Merrem 2/17->  Azithromycin and Rocephin on 2/17 in ED   Subjective: Patient seen and evaluated today with no new acute complaints or concerns. No acute concerns or events noted overnight.  She has been weaned on her O2 further to 6L this morning.  She does not have much of an appetite and complains of some weakness.  Objective: Vitals:   08/30/17 2000 08/30/17 2022 08/31/17 0355 08/31/17 0444  BP: 136/66  (!) 144/72   Pulse: 78 77 76   Resp: 20 16 18    Temp: 97.8 F (36.6 C)  97.9 F (36.6 C)  TempSrc: Oral  Oral   SpO2: 97% 95% 94% 92%  Weight:   101.5 kg (223 lb 12.3 oz)   Height:        Intake/Output Summary (Last 24 hours) at 08/31/2017 1101 Last data filed at 08/31/2017 0100 Gross per 24 hour  Intake 200 ml  Output 500 ml  Net -300 ml   Filed Weights   08/13/2017 0954 08/30/17 1633 08/31/17 0355  Weight: 77.1 kg (170 lb)  100.1 kg (220 lb 10.9 oz) 101.5 kg (223 lb 12.3 oz)    Examination:  General exam: Appears calm and comfortable  Respiratory system: Clear to auscultation. Respiratory effort normal. On 6L [place]. Cardiovascular system: S1 & S2 heard, RRR. No JVD, murmurs, rubs, gallops or clicks. 1-2+ pitting edema. Gastrointestinal system: Abdomen is nondistended, soft and nontender. No organomegaly or masses felt. Normal bowel sounds heard. Central nervous system: Alert and oriented. No focal neurological deficits. Extremities: Symmetric 5 x 5 power. Skin: No rashes, lesions or ulcers Psychiatry: Judgement and insight appear normal. Mood & affect appropriate.     Data Reviewed: I have personally reviewed following labs and imaging studies  CBC: Recent Labs  Lab 08/15/2017 0954 08/30/17 0243 08/31/17 0518  WBC 8.8 7.4 16.5*  NEUTROABS 7.5  --   --   HGB 12.5 11.9* 12.6  HCT 37.8 36.1 38.4  MCV 89.6 88.7 89.3  PLT 160 162 198   Basic Metabolic Panel: Recent Labs  Lab 08/31/2017 0954 08/30/17 0243 08/31/17 0518  NA 136 136 135  K 3.9 3.6 3.8  CL 104 107 104  CO2 21* 20* 20*  GLUCOSE 138* 173* 147*  BUN 33* 28* 37*  CREATININE 1.05* 0.84 0.89  CALCIUM 8.3* 8.2* 8.7*   GFR: Estimated Creatinine Clearance: 54.6 mL/min (by C-G formula based on SCr of 0.89 mg/dL). Liver Function Tests: Recent Labs  Lab 08/25/2017 0954 08/30/17 0243  AST 46* 44*  ALT 30 29  ALKPHOS 94 96  BILITOT 0.9 0.6  PROT 5.5* 5.4*  ALBUMIN 2.4* 2.3*   No results for input(s): LIPASE, AMYLASE in the last 168 hours. No results for input(s): AMMONIA in the last 168 hours. Coagulation Profile: Recent Labs  Lab 08/16/2017 0954 08/30/17 0243  INR 1.73 1.53   Cardiac Enzymes: Recent Labs  Lab 08/26/2017 0954 09/02/2017 1530 08/30/2017 2108 08/30/17 0243  TROPONINI 0.08* 0.23* 0.19* 0.15*   BNP (last 3 results) No results for input(s): PROBNP in the last 8760 hours. HbA1C: No results for input(s): HGBA1C in the  last 72 hours. CBG: Recent Labs  Lab 08/30/17 0755 08/31/17 0807  GLUCAP 158* 229*   Lipid Profile: No results for input(s): CHOL, HDL, LDLCALC, TRIG, CHOLHDL, LDLDIRECT in the last 72 hours. Thyroid Function Tests: No results for input(s): TSH, T4TOTAL, FREET4, T3FREE, THYROIDAB in the last 72 hours. Anemia Panel: No results for input(s): VITAMINB12, FOLATE, FERRITIN, TIBC, IRON, RETICCTPCT in the last 72 hours. Sepsis Labs: Recent Labs  Lab 08/22/2017 1014 08/28/2017 1240 09/06/2017 1530 08/30/17 0243 08/31/17 0518  PROCALCITON  --   --  0.40 0.38 0.28  LATICACIDVEN 2.93* 1.94*  --  1.9 1.5    Recent Results (from the past 240 hour(s))  Culture, blood (Routine x 2)     Status: None (Preliminary result)   Collection Time: 08/13/2017  9:56 AM  Result Value Ref Range Status   Specimen Description BLOOD RIGHT HAND  Final   Special Requests   Final    BOTTLES DRAWN AEROBIC AND ANAEROBIC  Blood Culture adequate volume   Culture   Final    NO GROWTH 2 DAYS Performed at [facility], [address], [place], [place] [address]    Report Status PENDING  Incomplete  Culture, blood (Routine x 2)     Status: None (Preliminary result)   Collection Time: September 20, 2017  9:58 AM  Result Value Ref Range Status   Specimen Description RIGHT ANTECUBITAL  Final   Special Requests   Final    BOTTLES DRAWN AEROBIC AND ANAEROBIC Blood Culture adequate volume   Culture   Final    NO GROWTH 2 DAYS Performed at [facility], [address] [place], [place] [address]    Report Status PENDING  Incomplete  Respiratory Panel by PCR     Status: None   Collection Time: 2017-09-20 12:31 PM  Result Value Ref Range Status   Adenovirus NOT DETECTED NOT DETECTED Final   Coronavirus 229E NOT DETECTED NOT DETECTED Final   Coronavirus HKU1 NOT DETECTED NOT DETECTED Final   Coronavirus NL63 NOT DETECTED NOT DETECTED Final   Coronavirus OC43 NOT DETECTED NOT DETECTED Final   Metapneumovirus NOT DETECTED NOT DETECTED  Final   Rhinovirus / Enterovirus NOT DETECTED NOT DETECTED Final   Influenza A NOT DETECTED NOT DETECTED Final   Influenza B NOT DETECTED NOT DETECTED Final   Parainfluenza Virus 1 NOT DETECTED NOT DETECTED Final   Parainfluenza Virus 2 NOT DETECTED NOT DETECTED Final   Parainfluenza Virus 3 NOT DETECTED NOT DETECTED Final   Parainfluenza Virus 4 NOT DETECTED NOT DETECTED Final   Respiratory Syncytial Virus NOT DETECTED NOT DETECTED Final   Bordetella pertussis NOT DETECTED NOT DETECTED Final   Chlamydophila pneumoniae NOT DETECTED NOT DETECTED Final   Mycoplasma pneumoniae NOT DETECTED NOT DETECTED Final  Culture, sputum-assessment     Status: None   Collection Time: September 20, 2017  3:14 PM  Result Value Ref Range Status   Specimen Description EXPECTORATED SPUTUM  Final   Special Requests NONE  Final   Sputum evaluation   Final    Sputum specimen not acceptable for testing.  Please recollect.   CALLED TO DANIEL RN AT 1019A ON [id number] BY THOMPSON S. Performed at [facility], [address], [place], [place] [address]    Report Status 08/31/2017 FINAL  Final  MRSA PCR Screening     Status: None   Collection Time: 08/30/17  7:00 PM  Result Value Ref Range Status   MRSA by PCR NEGATIVE NEGATIVE Final    Comment:        The GeneXpert MRSA Assay (FDA approved for NASAL specimens only), is one component of a comprehensive MRSA colonization surveillance program. It is not intended to diagnose MRSA infection nor to guide or monitor treatment for MRSA infections. Performed at [facility], [address], [place], [place] [address]          Radiology Studies: Ct Chest Wo Contrast  Result Date: 08/30/2017 CLINICAL DATA:  84 year old female with question esophageal injury with possible pneumomediastinum identified on September 20, 2017 CT. Patient given oral contrast evaluate for extraluminal contrast/esophageal injury. History of neck surgery in 05/2017. Also with cough and lethargy. EXAM: CT  CHEST WITHOUT CONTRAST TECHNIQUE: Multidetector CT imaging of the chest was performed following the standard protocol without IV contrast. COMPARISON:  09-20-17 chest CT and prior studies FINDINGS: Cardiovascular: Cardiomegaly and coronary artery/thoracic aortic atherosclerotic calcifications again noted. Ectatic ascending thoracic aorta measuring 3.8 cm again noted. No pericardial effusion. Mediastinum/Nodes: Oral contrast within the mid  and distal esophagus and proximal stomach identified. There is no evidence of extraluminal contrast or mediastinal air on today's study. Shotty mediastinal and bilateral hilar nodes are again noted. Lungs/Pleura: Diffuse ground-glass, interstitial and airspace opacities within both lungs are again noted appear slightly increased. Small bilateral pleural effusions are again noted, left-greater-than-right. There is no evidence of pneumothorax. Upper Abdomen: No acute abnormality. A small hiatal hernia is noted. Musculoskeletal: No acute or suspicious abnormalities. IMPRESSION: 1. No evidence of extraluminal oral contrast or mediastinal air on today's study. 2. Diffuse ground-glass, interstitial and airspace opacities bilaterally which appear slightly increased. This is nonspecific but may represent diffuse edema and/or infection. 3. Small bilateral pleural effusions, cardiomegaly and small hiatal hernia again noted. 4. Ectatic ascending thoracic aorta again noted measuring 3.8 cm. 5. Coronary artery and aortic Atherosclerosis (ICD10-I70.0). Electronically Signed   By: Harmon Pier M.D.   On: 08/30/2017 10:38   Ct Angio Chest Pe W Or Wo Contrast  Result Date: 09/05/2017 CLINICAL DATA:  Bronchitis 2-3 weeks with completion of antibiotics and continued cough and lethargy. EXAM: CT ANGIOGRAPHY CHEST WITH CONTRAST TECHNIQUE: Multidetector CT imaging of the chest was performed using the standard protocol during bolus administration of intravenous contrast. Multiplanar CT image  reconstructions and MIPs were obtained to evaluate the vascular anatomy. CONTRAST:  80mL ISOVUE-370 IOPAMIDOL (ISOVUE-370) INJECTION 76% COMPARISON:  Chest x-ray 08/24/2017 and 01/25/2017 as well as abdominal CT 03/12/2008 FINDINGS: Cardiovascular: Borderline cardiomegaly. Minimal calcified plaque over the left anterior descending and lateral circumflex coronary arteries. Calcified plaque over the thoracic aorta. There is mild ectasia of the ascending thoracic aorta measuring 3.8 cm in AP diameter. No evidence of dissection. Pulmonary arterial system is well opacified without evidence of emboli. Mediastinum/Nodes: No evidence of mediastinal or hilar adenopathy. Suggestion of a small hiatal hernia. Small collection of air adjacent the left inferior heart border and few tiny foci of air adjacent the distal esophagus and gastroesophageal junction likely minimal pneumomediastinum. Lungs/Pleura: Lungs are somewhat hypoinflated demonstrate heterogeneous bilateral mixed interstitial airspace process. Small bilateral pleural effusions left greater than right. No cavitary lesion identified. Several air bronchograms. Airways are otherwise within normal. Upper Abdomen: 1.5 cm cyst over the anterior segment right lobe of the liver. Musculoskeletal: Degenerative change of the spine. Review of the MIP images confirms the above findings. IMPRESSION: No evidence of pulmonary embolism. Bilateral patchy heterogeneous mixed interstitial airspace process with small bilateral pleural effusions with left greater than right. Findings likely due to diffuse multifocal pneumonia. Findings suggesting minimal pneumomediastinum as this may be secondary to esophageal injury given a few small foci of air adjacent the esophagus. Mild cardiomegaly.  Mild atherosclerotic coronary artery disease. Ectasia of the ascending thoracic aorta measuring 3.8 cm in AP diameter. Recommend annual imaging followup by CTA or MRA. This recommendation follows 2010  ACCF/AHA/AATS/ACR/ASA/SCA/SCAI/SIR/STS/SVM Guidelines for the Diagnosis and Management of Patients with Thoracic Aortic Disease. Circulation.2010; 121: Z610-R604. 1.5 cm liver cyst. Aortic Atherosclerosis (ICD10-I70.0). Critical Value/emergent results were called by telephone at the time of interpretation on 08/20/2017 at 2:33 pm to Dr. Effie Shy, who verbally acknowledged these results. Electronically Signed   By: Elberta Fortis M.D.   On: 09/05/2017 14:33        Scheduled Meds: . amiodarone  200 mg Oral QHS  . amLODipine  2.5 mg Oral QPC breakfast  . bisacodyl  10 mg Rectal Daily  . docusate sodium  100 mg Oral BID  . estradiol  1 mg Oral Daily  . feeding supplement (ENSURE ENLIVE)  237 mL Oral BID BM  . ipratropium-albuterol  3 mL Nebulization TID  . loratadine  10 mg Oral Daily  . methylPREDNISolone (SOLU-MEDROL) injection  60 mg Intravenous Q12H  . metoprolol tartrate  25 mg Oral BID  . pantoprazole (PROTONIX) IV  40 mg Intravenous Q24H  . rivaroxaban  15 mg Oral Q supper  . rOPINIRole  1 mg Oral QHS   Continuous Infusions: . meropenem (MERREM) IV Stopped (08/31/17 0438)     LOS: 2 days    Time spent: 30 minutes    Kiley Solimine Hoover Brunette Leavy Heatherly, DO [facility] Pager [phone]  If 7PM-7AM, please contact night-coverage [email or web address] Password [email or web address] 08/31/2017, 11:01 AM

## 2017-09-01 ENCOUNTER — Inpatient Hospital Stay (HOSPITAL_COMMUNITY): Payer: Medicare Other

## 2017-09-01 ENCOUNTER — Encounter (HOSPITAL_COMMUNITY): Payer: Self-pay | Admitting: *Deleted

## 2017-09-01 DIAGNOSIS — I361 Nonrheumatic tricuspid (valve) insufficiency: Secondary | ICD-10-CM

## 2017-09-01 LAB — ECHOCARDIOGRAM COMPLETE
Height: 67 in
Weight: 3418.01 oz

## 2017-09-01 LAB — BASIC METABOLIC PANEL
ANION GAP: 13 (ref 5–15)
BUN: 42 mg/dL — ABNORMAL HIGH (ref 6–20)
CHLORIDE: 102 mmol/L (ref 101–111)
CO2: 24 mmol/L (ref 22–32)
CREATININE: 1.04 mg/dL — AB (ref 0.44–1.00)
Calcium: 8.8 mg/dL — ABNORMAL LOW (ref 8.9–10.3)
GFR calc non Af Amer: 47 mL/min — ABNORMAL LOW (ref >60)
GFR, EST AFRICAN AMERICAN: 54 mL/min — AB (ref >60)
Glucose, Bld: 167 mg/dL — ABNORMAL HIGH (ref 65–99)
Potassium: 3.3 mmol/L — ABNORMAL LOW (ref 3.5–5.1)
SODIUM: 139 mmol/L (ref 135–145)

## 2017-09-01 LAB — CBC
HEMATOCRIT: 39.5 % (ref 36.0–46.0)
HEMOGLOBIN: 13.2 g/dL (ref 12.0–15.0)
MCH: 29.3 pg (ref 26.0–34.0)
MCHC: 33.4 g/dL (ref 30.0–36.0)
MCV: 87.6 fL (ref 78.0–100.0)
Platelets: 197 10*3/uL (ref 150–400)
RBC: 4.51 MIL/uL (ref 3.87–5.11)
RDW: 15.2 % (ref 11.5–15.5)
WBC: 15.6 10*3/uL — AB (ref 4.0–10.5)

## 2017-09-01 LAB — GLUCOSE, CAPILLARY: Glucose-Capillary: 151 mg/dL — ABNORMAL HIGH (ref 65–99)

## 2017-09-01 LAB — LACTIC ACID, PLASMA
LACTIC ACID, VENOUS: 1.9 mmol/L (ref 0.5–1.9)
Lactic Acid, Venous: 2.3 mmol/L (ref 0.5–1.9)

## 2017-09-01 LAB — TROPONIN I: TROPONIN I: 0.07 ng/mL — AB (ref <0.03)

## 2017-09-01 MED ORDER — METOPROLOL TARTRATE 5 MG/5ML IV SOLN
2.5000 mg | Freq: Three times a day (TID) | INTRAVENOUS | Status: DC
  Administered 2017-09-01 – 2017-09-03 (×7): 2.5 mg via INTRAVENOUS
  Filled 2017-09-01 (×7): qty 5

## 2017-09-01 MED ORDER — POTASSIUM CHLORIDE CRYS ER 20 MEQ PO TBCR
40.0000 meq | EXTENDED_RELEASE_TABLET | Freq: Once | ORAL | Status: DC
  Filled 2017-09-01: qty 2

## 2017-09-01 MED ORDER — ENOXAPARIN SODIUM 100 MG/ML ~~LOC~~ SOLN
100.0000 mg | Freq: Two times a day (BID) | SUBCUTANEOUS | Status: DC
  Administered 2017-09-01 – 2017-09-07 (×12): 100 mg via SUBCUTANEOUS
  Filled 2017-09-01 (×13): qty 1

## 2017-09-01 MED ORDER — FUROSEMIDE 10 MG/ML IJ SOLN
20.0000 mg | Freq: Once | INTRAMUSCULAR | Status: AC
  Administered 2017-09-01: 20 mg via INTRAVENOUS

## 2017-09-01 MED ORDER — METHYLPREDNISOLONE SODIUM SUCC 40 MG IJ SOLR
40.0000 mg | Freq: Three times a day (TID) | INTRAMUSCULAR | Status: DC
  Administered 2017-09-01 – 2017-09-07 (×19): 40 mg via INTRAVENOUS
  Filled 2017-09-01 (×19): qty 1

## 2017-09-01 MED ORDER — FUROSEMIDE 10 MG/ML IJ SOLN
20.0000 mg | Freq: Every day | INTRAMUSCULAR | Status: DC
  Administered 2017-09-02 – 2017-09-03 (×2): 20 mg via INTRAVENOUS
  Filled 2017-09-01 (×2): qty 2

## 2017-09-01 MED ORDER — POTASSIUM CHLORIDE 10 MEQ/100ML IV SOLN
10.0000 meq | INTRAVENOUS | Status: AC
  Administered 2017-09-01 (×2): 10 meq via INTRAVENOUS
  Filled 2017-09-01: qty 100

## 2017-09-01 MED ORDER — MAGNESIUM SULFATE 2 GM/50ML IV SOLN
2.0000 g | Freq: Once | INTRAVENOUS | Status: AC
  Administered 2017-09-01: 2 g via INTRAVENOUS
  Filled 2017-09-01: qty 50

## 2017-09-01 MED ORDER — BUDESONIDE 0.5 MG/2ML IN SUSP
0.5000 mg | Freq: Two times a day (BID) | RESPIRATORY_TRACT | Status: DC
  Administered 2017-09-01 – 2017-09-12 (×21): 0.5 mg via RESPIRATORY_TRACT
  Filled 2017-09-01 (×21): qty 2

## 2017-09-01 NOTE — Progress Notes (Signed)
PT Cancellation Note  Patient Details Name: [name] MRN: [id number] DOB: 08/17/1927   Cancelled Treatment:    Reason Eval/Treat Not Completed: Medical issues which prohibited therapy.  Patient held per RN request due to patient having difficulty breathing with severe O2 desaturation.  Will check back tomorrow.   2:24 PM, 09/01/17 Ocie BobJames Stanely Sexson, MPT Physical Therapist with [facility] 336 [phone] office [phone] mobile phone

## 2017-09-01 NOTE — Progress Notes (Signed)
Pharmacy Antibiotic Note  Sherwood GamblerDorothy W Balli is a 84 y.o. female admitted on 11/16/17 with HCAP.  Pharmacy has been consulted for meropenem dosing. Day#4 of Merrem, patient improving.   Plan: Continue  Meropenem 1000 mg IV Q12 hours Monitor labs, cultures, and patient improvement F/u deescalation to po abx  Height: 5\' 7"  (170.2 cm) Weight: 213 lb 10 oz (96.9 kg) IBW/kg (Calculated) : 61.6  Temp (24hrs), Avg:97.7 F (36.5 C), Min:97.5 F (36.4 C), Max:97.9 F (36.6 C)  Recent Labs  Lab 07-05-18 0954  08/30/17 0243 08/31/17 0518 08/31/17 2201 09/01/17 0040 09/01/17 0428  WBC 8.8  --  7.4 16.5* 18.6*  --  15.6*  CREATININE 1.05*  --  0.84 0.89 0.92  --  1.04*  LATICACIDVEN  --    < > 1.9 1.5 2.1* 2.3* 1.9   < > = values in this interval not displayed.    Estimated Creatinine Clearance: 45.5 mL/min (A) (by C-G formula based on SCr of 1.04 mg/dL (H)).    Allergies  Allergen Reactions  . Gabapentin     Cardiac concerns (d/c by cardiologist)  . Hctz [Hydrochlorothiazide]     Dizziness   . Lasix [Furosemide]     fatigue   . Penicillins Hives and Itching  . Tramadol-Acetaminophen     GI upset     Antimicrobials this admission: ceftriaxone 2/17 >> 2/17 Azithromycin 2/17>>2/17 Meropenem 2/17 >>   Dose adjustments this admission: Meropenem renally adjusted to every 12 hours  Microbiology results: 2/17 BCx: ngtd 2/17 Sputum: not acceptable for testing 2/17 MRSA PCR: negative  Thank you for allowing pharmacy to be a part of this patient's care.  Elder CyphersLorie Tahjai Schetter, BS Pharm D, [place] Clinical Pharmacist Pager [phone] 09/01/2017 11:43 AM

## 2017-09-01 NOTE — Progress Notes (Signed)
*  PRELIMINARY RESULTS* Echocardiogram 2D Echocardiogram has been performed.  Jeryl Columbialliott, Carlette Palmatier 09/01/2017, 1:51 PM

## 2017-09-01 NOTE — Progress Notes (Signed)
Patient on Bipap O2 Sat at 92%, Bipap taken off, medication attempted to be given, patient desaturated to 57%. Bipap placed, SP02: 93%. MD notified. Patient alert.

## 2017-09-01 NOTE — Progress Notes (Signed)
Neb treatment stop due to patient spo2 dropped from 97% to 83% in 4 minutes treatment will be restarted when BIPAP is placed on patient at 2230

## 2017-09-01 NOTE — Progress Notes (Signed)
ANTICOAGULATION CONSULT NOTE - Initial Consult  Pharmacy Consult for lovenox Indication: atrial fibrillation  Allergies  Allergen Reactions  . Gabapentin     Cardiac concerns (d/c by cardiologist)  . Hctz [Hydrochlorothiazide]     Dizziness   . Lasix [Furosemide]     fatigue   . Penicillins Hives and Itching  . Tramadol-Acetaminophen     GI upset     Patient Measurements: Height: 5\' 7"  (170.2 cm) Weight: 213 lb 10 oz (96.9 kg) IBW/kg (Calculated) : 61.6  Vital Signs: Temp: 97.9 F (36.6 C) (02/20 0500) Temp Source: Axillary (02/20 0500) BP: 144/83 (02/20 0853) Pulse Rate: 104 (02/20 0853)  Labs: Recent Labs    08/30/17 0243 08/31/17 0518 08/31/17 2201 09/01/17 0428  HGB 11.9* 12.6 14.0 13.2  HCT 36.1 38.4 42.2 39.5  PLT 162 198 242 197  LABPROT 18.2*  --   --   --   INR 1.53  --   --   --   CREATININE 0.84 0.89 0.92 1.04*  TROPONINI 0.15*  --  0.09* 0.07*    Estimated Creatinine Clearance: 45.5 mL/min (A) (by C-G formula based on SCr of 1.04 mg/dL (H)).   Medical History: Past Medical History:  Diagnosis Date  . Arthritis   . Chronic kidney disease   . Depression   . Dysrhythmia 8/13   palpitations/ now resolved  . GERD (gastroesophageal reflux disease)   . History of blood transfusion 2012  . Hypertension   . Peripheral vascular disease (HCC)    varicose veins bilaterally   . Restless legs syndrome     Medications:  Medications Prior to Admission  Medication Sig Dispense Refill Last Dose  . acetaminophen-codeine (TYLENOL #4) 300-60 MG tablet Take 1 tablet by mouth every 4 (four) hours as needed for pain.   08/28/2017 at Unknown time  . albuterol (VENTOLIN HFA) 108 (90 Base) MCG/ACT inhaler Inhale 2 puffs daily as needed into the lungs for wheezing or shortness of breath.    08/28/2017 at Unknown time  . ALPRAZolam (XANAX) 0.25 MG tablet Take 0.25 mg at bedtime as needed by mouth for anxiety or sleep.    08/28/2017 at Unknown time  . amiodarone  (PACERONE) 200 MG tablet Take 200 mg at bedtime by mouth.    08/28/2017 at Unknown time  . amLODipine (NORVASC) 5 MG tablet Take 2.5 mg daily after breakfast by mouth.    08/28/2017 at Unknown time  . bisacodyl (DULCOLAX) 10 MG suppository Place 10 mg daily rectally.   Past Month at Unknown time  . cetirizine (ZYRTEC) 10 MG tablet Take 10 mg by mouth daily.   08/28/2017 at Unknown time  . conjugated estrogens (PREMARIN) vaginal cream Place 1 application at bedtime as needed vaginally.   Past Week at Unknown time  . Cyanocobalamin (VITAMIN B-12 IJ) Inject 1 each every 30 (thirty) days as directed.   Past Month at Unknown time  . docusate sodium (COLACE) 100 MG capsule Take 100 mg by mouth 2 (two) times daily.   08/28/2017 at Unknown time  . estradiol (ESTRACE) 1 MG tablet Take 1 mg by mouth daily.   08/28/2017 at Unknown time  . fluticasone (FLONASE) 50 MCG/ACT nasal spray Place 1 spray daily as needed into both nostrils for allergies.    08/28/2017 at Unknown time  . metoprolol succinate (TOPROL-XL) 25 MG 24 hr tablet Take 1 tablet (25 mg total) by mouth 2 (two) times daily. 60 tablet 0 08/28/2017 at 1800  . metoprolol tartrate (  LOPRESSOR) 25 MG tablet Take 1 tablet by mouth 2 (two) times daily.   08/28/2017 at 1800  . oseltamivir (TAMIFLU) 75 MG capsule Take 1 capsule by mouth daily.   08/28/2017 at Unknown time  . pantoprazole (PROTONIX) 40 MG tablet Take 40 mg by mouth daily.   08/28/2017 at Unknown time  . rOPINIRole (REQUIP) 0.5 MG tablet Take 1 mg by mouth at bedtime.   08/28/2017 at Unknown time  . XARELTO 15 MG TABS tablet Take 15 mg daily by mouth.    08/28/2017 at Unknown time    Assessment: 84 year old female with history significant for CKD stage III, GERD, hypertension, paroxysmal atrial fibrillation on Xarelto. Last dose of xarelto given yesterday 2/19 at 1700.  Pharmacy asked to transition to lovenox for afib.  Goal of Therapy:  Monitor platelets by anticoagulation protocol: Yes   Plan:   Lovenox 1mg /kg (100mg ) sq q12h Monitor for s/s of bleeding CBC MWF  Elder CyphersLorie Dawon Troop, BS Loura BackPharm D, BCPS Clinical Pharmacist Pager [phone] 09/01/2017,11:31 AM

## 2017-09-01 NOTE — Progress Notes (Signed)
Patient is resting quietly in bed with head of bed elevated at 44 degrees. Bi pap remains in place with O2 sat 98%. Skin is warm and dry. Chest rise and fall is equal and non labored. Will continue to monitor.

## 2017-09-01 NOTE — Progress Notes (Signed)
Patient is unable to swallow Potasium tablets. It is difficult for her to swallow while off Bipap.

## 2017-09-01 NOTE — Progress Notes (Signed)
[facility] PROGRESS NOTE  [name] ZOX:[id number] DOB: 05/22/1928 DOA: 08/17/2017 PCP: Lenell AntuLe, Thao P, DO  Interim summary and HPI 84 year old female with history significant for CKD stage III, GERD, hypertension, paroxysmal atrial fibrillation on Xarelto, and peripheral vascular disease who presented to the emergency with worsening cough and shortness of breath over the last 2-3 weeks.  She has been diagnosed with sepsis secondary to multilobular pneumonia and was initially thought to have some pneumomediastinum, but this appears to have resolved on repeat CT chest with gastrografin on 2/18. Case complicated with acute on chronic CHF now. Started on BIPAP and treatment with IV lasix started. Patient transferred to stepdown.  Assessment/Plan: 1-acute hypoxemic resp failure: in the setting of multifocal PNA, RAD and now acute on chronic diastolic CHF -Continue antibiotics (patient on meropenem day 4 out of 7) -Continue duo nebs, started on Pulmicort, and prednisone for acute RAD. -Continue BiPAP with intention to wean as tolerated for oxygen saturation goal above 89% -Daily weights, strict intake and output and IV diuretics -Palliative care consult -After discussing with family members will treat what is treatable and if possible avoid heroic interventions. -NPO status while on BIPAP -IV meds and transition oral meds as much as possible  2-Atrial fibrillation -continue lopressor for rate control -holding xarelto while NPO -pharmacy for Lovenox  -CHADsVASC score 4  3-acute on chronic diastolic HF -continue lasix as mentioned above -echo with preserved EF and grade 1 diastolic HF -daily weights and strict intake and output   4-elevated troponin -in the setting of demand ischemia  -echo w/o wall motion abnormalities   5-hypokalemia -repleted   6-GERD -IV PPI  7-CKD stage 3 -overall stable -will follow Cr closely especially while diuresing.   Code Status: Partial (ok  for temporary intubation; otherwise no heroic interventions) Family Communication: daughter at bedside  Disposition Plan: transfer to stepdown, continue treatment with IV antibiotics, start treatment with lasix, change meds to IV route and hold others for now; continue BIPAP. Palliative care consulted.   Consultants:  Palliative Care  Procedures:  2-D echo -preserved EF -grade 1 diastolic HF -no wall motion abnormalities   Antibiotics:  Ceftriaxone and Zithromax 2/17>>2/17  Meropenem 2/17>>  HPI/Subjective: Afebrile, no chest pain.  Patient with worsening respiratory distress and requiring BiPAP.  Objective: Vitals:   09/01/17 1533 09/01/17 1630  BP:    Pulse:  91  Resp:  17  Temp: 97.8 F (36.6 C) 97.7 F (36.5 C)  SpO2:  96%    Intake/Output Summary (Last 24 hours) at 09/01/2017 1724 Last data filed at 09/01/2017 1135 Gross per 24 hour  Intake 790 ml  Output 2700 ml  Net -1910 ml   Filed Weights   08/31/17 0355 09/01/17 0500 09/01/17 1533  Weight: 101.5 kg (223 lb 12.3 oz) 96.9 kg (213 lb 10 oz) 100.5 kg (221 lb 9 oz)    Exam:   General: patient with increase resp distress, mild use of accessory muscles and requiring BIPAP supplementation. No fever, no CP.  Cardiovascular: mild JVD, no rubs, no gallops, positive SEM  Respiratory: Tachypneic, mild use of accessory muscles, unable to speak in full sentences and with positive rhonchi   Abdomen: soft, NT, ND, positive BS  Musculoskeletal: trace edema, no cyanosis   Data Reviewed: Basic Metabolic Panel: Recent Labs  Lab 08/21/2017 0954 08/30/17 0243 08/31/17 0518 08/31/17 2201 09/01/17 0428  NA 136 136 135 138 139  K 3.9 3.6 3.8 3.6 3.3*  CL 104 107 104 103  102  CO2 21* 20* 20* 21* 24  GLUCOSE 138* 173* 147* 166* 167*  BUN 33* 28* 37* 42* 42*  CREATININE 1.05* 0.84 0.89 0.92 1.04*  CALCIUM 8.3* 8.2* 8.7* 9.1 8.8*  MG  --   --   --  2.0  --   PHOS  --   --   --  2.9  --    Liver Function  Tests: Recent Labs  Lab 18-Sep-2017 0954 08/30/17 0243 08/31/17 2201  AST 46* 44* 39  ALT 30 29 39  ALKPHOS 94 96 132*  BILITOT 0.9 0.6 0.7  PROT 5.5* 5.4* 6.8  ALBUMIN 2.4* 2.3* 2.8*   CBC: Recent Labs  Lab September 18, 2017 0954 08/30/17 0243 08/31/17 0518 08/31/17 2201 09/01/17 0428  WBC 8.8 7.4 16.5* 18.6* 15.6*  NEUTROABS 7.5  --   --  16.4  --   HGB 12.5 11.9* 12.6 14.0 13.2  HCT 37.8 36.1 38.4 42.2 39.5  MCV 89.6 88.7 89.3 89.2 87.6  PLT 160 162 198 242 197   Cardiac Enzymes: Recent Labs  Lab 2017/09/18 1530 September 18, 2017 2108 08/30/17 0243 08/31/17 2201 09/01/17 0428  TROPONINI 0.23* 0.19* 0.15* 0.09* 0.07*   BNP (last 3 results) Recent Labs    2017-09-18 0954 08/31/17 2201  BNP 189.0* 626.0*    CBG: Recent Labs  Lab 08/30/17 0755 08/31/17 0807 09/01/17 0449  GLUCAP 158* 229* 151*    Recent Results (from the past 240 hour(s))  Culture, blood (Routine x 2)     Status: None (Preliminary result)   Collection Time: Sep 18, 2017  9:56 AM  Result Value Ref Range Status   Specimen Description BLOOD RIGHT HAND  Final   Special Requests   Final    BOTTLES DRAWN AEROBIC AND ANAEROBIC Blood Culture adequate volume   Culture   Final    NO GROWTH 3 DAYS Performed at [facility], [address], [place], [place] [address]    Report Status PENDING  Incomplete  Culture, blood (Routine x 2)     Status: None (Preliminary result)   Collection Time: 09/18/2017  9:58 AM  Result Value Ref Range Status   Specimen Description RIGHT ANTECUBITAL  Final   Special Requests   Final    BOTTLES DRAWN AEROBIC AND ANAEROBIC Blood Culture adequate volume   Culture   Final    NO GROWTH 3 DAYS Performed at [facility], [address], [place], [place] [address]    Report Status PENDING  Incomplete  Respiratory Panel by PCR     Status: None   Collection Time: 09-18-2017 12:31 PM  Result Value Ref Range Status   Adenovirus NOT DETECTED NOT DETECTED Final   Coronavirus 229E NOT DETECTED NOT  DETECTED Final   Coronavirus HKU1 NOT DETECTED NOT DETECTED Final   Coronavirus NL63 NOT DETECTED NOT DETECTED Final   Coronavirus OC43 NOT DETECTED NOT DETECTED Final   Metapneumovirus NOT DETECTED NOT DETECTED Final   Rhinovirus / Enterovirus NOT DETECTED NOT DETECTED Final   Influenza A NOT DETECTED NOT DETECTED Final   Influenza B NOT DETECTED NOT DETECTED Final   Parainfluenza Virus 1 NOT DETECTED NOT DETECTED Final   Parainfluenza Virus 2 NOT DETECTED NOT DETECTED Final   Parainfluenza Virus 3 NOT DETECTED NOT DETECTED Final   Parainfluenza Virus 4 NOT DETECTED NOT DETECTED Final   Respiratory Syncytial Virus NOT DETECTED NOT DETECTED Final   Bordetella pertussis NOT DETECTED NOT DETECTED Final   Chlamydophila pneumoniae NOT DETECTED NOT DETECTED Final  Mycoplasma pneumoniae NOT DETECTED NOT DETECTED Final  Culture, sputum-assessment     Status: None   Collection Time: 09/03/2017  3:14 PM  Result Value Ref Range Status   Specimen Description EXPECTORATED SPUTUM  Final   Special Requests NONE  Final   Sputum evaluation   Final    Sputum specimen not acceptable for testing.  Please recollect.   CALLED TO DANIEL RN AT 1019A ON [id number] BY THOMPSON S. Performed at [facility], [address], [place], [place] [address]    Report Status 08/31/2017 FINAL  Final  MRSA PCR Screening     Status: None   Collection Time: 08/30/17  7:00 PM  Result Value Ref Range Status   MRSA by PCR NEGATIVE NEGATIVE Final    Comment:        The GeneXpert MRSA Assay (FDA approved for NASAL specimens only), is one component of a comprehensive MRSA colonization surveillance program. It is not intended to diagnose MRSA infection nor to guide or monitor treatment for MRSA infections. Performed at [facility], [address], [place], [place] [address]   Culture, expectorated sputum-assessment     Status: None   Collection Time: 08/31/17  4:50 AM  Result Value Ref Range Status   Specimen Description  EXPECTORATED SPUTUM  Final   Special Requests NONE  Final   Sputum evaluation   Final    THIS SPECIMEN IS ACCEPTABLE FOR SPUTUM CULTURE Performed at [facility] Performed at [facility], [address], [place], [place] [address]    Report Status 08/31/2017 FINAL  Final  Culture, respiratory (NON-Expectorated)     Status: None (Preliminary result)   Collection Time: 08/31/17  4:50 AM  Result Value Ref Range Status   Specimen Description   Final    EXPECTORATED SPUTUM Performed at [facility], [address] [place], [place] [address]    Special Requests   Final    NONE Reflexed from [id number] Performed at [facility], [address] [place], [place] [address]    Gram Stain   Final    MODERATE WBC PRESENT,BOTH PMN AND MONONUCLEAR FEW GRAM VARIABLE ROD RARE GRAM POSITIVE COCCI RARE YEAST ABUNDANT SQUAMOUS EPITHELIAL CELLS PRESENT    Culture   Final    CULTURE REINCUBATED FOR BETTER GROWTH Performed at [facility], [address] [place], [place] [address]    Report Status PENDING  Incomplete     Studies: Dg Chest Port 1 View  Result Date: 08/31/2017 CLINICAL DATA:  Hypoxia, shortness of breath. Admitted with pneumonia and sepsis. EXAM: PORTABLE CHEST 1 VIEW COMPARISON:  CT chest August 30, 2017 and chest radiograph August 29, 2017 FINDINGS: Diffuse interstitial and alveolar airspace opacities, small suspected pleural effusions. Cardiac silhouette is normal in size. Calcified aortic knob. Fullness of the RIGHT hilum. No pneumothorax. ACDF. IMPRESSION: Similar and alveolar airspace opacities concerning for pneumonia. Fullness of the RIGHT hila corresponding to vascular shadows on prior radiograph. Aortic Atherosclerosis (ICD10-I70.0). Electronically Signed   By: Awilda Metro M.D.   On: 08/31/2017 22:40    Scheduled Meds: . bisacodyl  10 mg Rectal Daily  . budesonide (PULMICORT) nebulizer solution  0.5 mg Nebulization BID  . enoxaparin (LOVENOX) injection   100 mg Subcutaneous Q12H  . feeding supplement (ENSURE ENLIVE)  237 mL Oral BID BM  . ipratropium-albuterol  3 mL Nebulization TID  . methylPREDNISolone (SOLU-MEDROL) injection  40 mg Intravenous Q8H  . metoprolol tartrate  2.5 mg Intravenous Q8H  . pantoprazole (  PROTONIX) IV  40 mg Intravenous Q24H   Continuous Infusions: . meropenem (MERREM) IV 1 g (09/01/17 1719)    Principal Problem:   Acute hypoxemic respiratory failure (HCC) Active Problems:   AF (paroxysmal atrial fibrillation) (HCC)   Chronic diastolic CHF (congestive heart failure) (HCC)   CKD (chronic kidney disease) stage 3, GFR 30-59 ml/min (HCC)   Essential hypertension   Multifocal pneumonia   Elevated troponin level   Sepsis (HCC)   Pneumonia    Time spent: 40 minutes (>50 % of time dedicated to face to face evaluation, discussion/update about plan of care with family and treatment coordination)    Vassie Loll  [facility] Pager [phone]. If 7PM-7AM, please contact night-coverage at [email or web address], password [facility] 09/01/2017, 5:24 PM  LOS: 3 days

## 2017-09-02 ENCOUNTER — Encounter (HOSPITAL_COMMUNITY): Payer: Self-pay | Admitting: Primary Care

## 2017-09-02 DIAGNOSIS — Z7189 Other specified counseling: Secondary | ICD-10-CM

## 2017-09-02 DIAGNOSIS — Z515 Encounter for palliative care: Secondary | ICD-10-CM

## 2017-09-02 DIAGNOSIS — J189 Pneumonia, unspecified organism: Secondary | ICD-10-CM

## 2017-09-02 LAB — CBC
HCT: 37.9 % (ref 36.0–46.0)
HEMATOCRIT: 38.4 % (ref 36.0–46.0)
HEMOGLOBIN: 12.5 g/dL (ref 12.0–15.0)
Hemoglobin: 12.4 g/dL (ref 12.0–15.0)
MCH: 29.3 pg (ref 26.0–34.0)
MCH: 29.4 pg (ref 26.0–34.0)
MCHC: 32.6 g/dL (ref 30.0–36.0)
MCHC: 32.7 g/dL (ref 30.0–36.0)
MCV: 90.1 fL (ref 78.0–100.0)
MCV: 89.8 fL (ref 78.0–100.0)
PLATELETS: 177 10*3/uL (ref 150–400)
Platelets: 165 10*3/uL (ref 150–400)
RBC: 4.26 MIL/uL (ref 3.87–5.11)
RBC: 4.22 MIL/uL (ref 3.87–5.11)
RDW: 15 % (ref 11.5–15.5)
RDW: 15.4 % (ref 11.5–15.5)
WBC: 12.2 10*3/uL — AB (ref 4.0–10.5)
WBC: 9.6 10*3/uL (ref 4.0–10.5)

## 2017-09-02 LAB — MRSA PCR SCREENING: MRSA BY PCR: NEGATIVE

## 2017-09-02 LAB — BASIC METABOLIC PANEL
ANION GAP: 10 (ref 5–15)
BUN: 42 mg/dL — ABNORMAL HIGH (ref 6–20)
CALCIUM: 8.7 mg/dL — AB (ref 8.9–10.3)
CO2: 28 mmol/L (ref 22–32)
Chloride: 103 mmol/L (ref 101–111)
Creatinine, Ser: 0.86 mg/dL (ref 0.44–1.00)
GFR calc non Af Amer: 59 mL/min — ABNORMAL LOW (ref >60)
Glucose, Bld: 142 mg/dL — ABNORMAL HIGH (ref 65–99)
Potassium: 3.9 mmol/L (ref 3.5–5.1)
SODIUM: 141 mmol/L (ref 135–145)

## 2017-09-02 NOTE — Progress Notes (Signed)
[facility] PROGRESS NOTE  Lisa ESCARENO ZOX:[id number] DOB: 1927-12-06 DOA: 08-30-17 PCP: Lenell Antu, DO  Interim summary and HPI 84 year old female with history significant for CKD stage III, GERD, hypertension, paroxysmal atrial fibrillation on Xarelto, and peripheral vascular disease who presented to the emergency with worsening cough and shortness of breath over the last 2-3 weeks.  She has been diagnosed with sepsis secondary to multilobular pneumonia and was initially thought to have some pneumomediastinum, but this appears to have resolved on repeat CT chest with gastrografin on 2/18. Case complicated with acute on chronic CHF now. Started on BIPAP and treatment with IV lasix started. Patient transferred to stepdown.  Assessment/Plan: 1-acute hypoxemic resp failure: in the setting of multifocal PNA, RAD and now acute on chronic diastolic CHF -Continue antibiotics (patient on meropenem day 5 out of 7) -Continue duonebs, continue Pulmicort, and steroids for acute RAD. -continue Daily weights, strict intake and output and IV diuretics -Palliative care consulted; will follow rec's for advance planning and GOC clarification  -After discussing with family members will treat what is treatable and if possible avoid heroic interventions. -will weaned off BIPAP and start flutter valve -remains NPO; will ask speech to evaluate -continue IV meds as much as possible and hold other medications until assess by speech therapy.   2-Atrial fibrillation -continue lopressor for rate control, given IV currently. -Continue holding xarelto while NPO -Appreciate pharmacy assistance for Lovenox dosing   -CHADsVASC score 4  3-acute on chronic diastolic HF -Continue IV Lasix -Good urine output -Continue daily weights and strict intake and output -Echo demonstrated preserved ejection fraction and grade 1 diastolic dysfunction.  4-elevated troponin -in the setting of demand ischemia  -echo w/o  wall motion abnormalities  -No acute ischemic changes appreciated on telemetry or EKG. -Patient currently chest pain-free.  5-hypokalemia -Repleted -K3.9 -Will monitor potassium as patient receiving daily IV Lasix.  6-GERD -Continue PPI -Currently using IV until evaluated by speech therapy.  7-CKD stage 3 -Creatinine 0.86 (GFR at baseline) -Follow closely with acute diuresis.  Code Status: Partial (ok for temporary intubation; otherwise no heroic interventions) Family Communication: daughter at bedside  Disposition Plan: remains in stepdown, continue treatment with IV antibiotics, continue IV  lasix, change meds to IV route and hold others for now, until seen by speech; continue weaning oxygen as tolerated. Palliative care consulted.   Consultants:  Palliative Care  Procedures:  2-D echo -preserved EF -grade 1 diastolic HF -no wall motion abnormalities   Antibiotics:  Ceftriaxone and Zithromax 2/17>>2/17  Meropenem 2/17>>  HPI/Subjective: Afebrile, feeling better today and breathing easier. Denies CP or palpitations. Would like to eat/drink something. Still requiring high flow oxygen supplementation.   Objective: Vitals:   09/02/17 1123 09/02/17 1402  BP:    Pulse: 92   Resp: 17   Temp: 98 F (36.7 C)   SpO2: 96% 95%    Intake/Output Summary (Last 24 hours) at 09/02/2017 1444 Last data filed at 09/02/2017 1437 Gross per 24 hour  Intake 200 ml  Output 2300 ml  Net -2100 ml   Filed Weights   09/01/17 0500 09/01/17 1533 09/02/17 0500  Weight: 96.9 kg (213 lb 10 oz) 100.5 kg (221 lb 9 oz) 98.8 kg (217 lb 13 oz)    Exam:   General: No fever, more awake/brighter, Able to follow commands appropriately and asking for something to eat/drink.  She ended requiring BiPAP throughout the night, currently transitioned to 15 L of high flow nasal cannula.  Cardiovascular:  No appreciated JVD, no rubs, no gallops, positive systolic ejection murmur.   Respiratory:  Improved air movement, no using accessory muscles, fine crackles at the bases appreciated, very little expiratory wheezing.  Abdomen: Soft, nontender, nondistended, positive bowel sounds.  Musculoskeletal: Trace edema bilaterally (right more than left), no cyanosis, no clubbing.  Data Reviewed: Basic Metabolic Panel: Recent Labs  Lab 08/30/17 0243 08/31/17 0518 08/31/17 2201 09/01/17 0428 09/02/17 0517  NA 136 135 138 139 141  K 3.6 3.8 3.6 3.3* 3.9  CL 107 104 103 102 103  CO2 20* 20* 21* 24 28  GLUCOSE 173* 147* 166* 167* 142*  BUN 28* 37* 42* 42* 42*  CREATININE 0.84 0.89 0.92 1.04* 0.86  CALCIUM 8.2* 8.7* 9.1 8.8* 8.7*  MG  --   --  2.0  --   --   PHOS  --   --  2.9  --   --    Liver Function Tests: Recent Labs  Lab 09/08/2017 0954 08/30/17 0243 08/31/17 2201  AST 46* 44* 39  ALT 30 29 39  ALKPHOS 94 96 132*  BILITOT 0.9 0.6 0.7  PROT 5.5* 5.4* 6.8  ALBUMIN 2.4* 2.3* 2.8*   CBC: Recent Labs  Lab 08/19/2017 0954 08/30/17 0243 08/31/17 0518 08/31/17 2201 09/01/17 0428 09/02/17 0517  WBC 8.8 7.4 16.5* 18.6* 15.6* 9.6  NEUTROABS 7.5  --   --  16.4  --   --   HGB 12.5 11.9* 12.6 14.0 13.2 12.5  HCT 37.8 36.1 38.4 42.2 39.5 38.4  MCV 89.6 88.7 89.3 89.2 87.6 90.1  PLT 160 162 198 242 197 165   Cardiac Enzymes: Recent Labs  Lab 08/17/2017 1530 09/08/2017 2108 08/30/17 0243 08/31/17 2201 09/01/17 0428  TROPONINI 0.23* 0.19* 0.15* 0.09* 0.07*   BNP (last 3 results) Recent Labs    08/28/2017 0954 08/31/17 2201  BNP 189.0* 626.0*    CBG: Recent Labs  Lab 08/30/17 0755 08/31/17 0807 09/01/17 0449  GLUCAP 158* 229* 151*    Recent Results (from the past 240 hour(s))  Culture, blood (Routine x 2)     Status: None (Preliminary result)   Collection Time: 08/14/2017  9:56 AM  Result Value Ref Range Status   Specimen Description BLOOD RIGHT HAND  Final   Special Requests   Final    BOTTLES DRAWN AEROBIC AND ANAEROBIC Blood Culture adequate volume    Culture   Final    NO GROWTH 4 DAYS Performed at [facility], [address] [place], [place] [address]    Report Status PENDING  Incomplete  Culture, blood (Routine x 2)     Status: None (Preliminary result)   Collection Time: 08/28/2017  9:58 AM  Result Value Ref Range Status   Specimen Description RIGHT ANTECUBITAL  Final   Special Requests   Final    BOTTLES DRAWN AEROBIC AND ANAEROBIC Blood Culture adequate volume   Culture   Final    NO GROWTH 4 DAYS Performed at [facility], [address] [place], [place] [address]    Report Status PENDING  Incomplete  Respiratory Panel by PCR     Status: None   Collection Time: 08/30/2017 12:31 PM  Result Value Ref Range Status   Adenovirus NOT DETECTED NOT DETECTED Final   Coronavirus 229E NOT DETECTED NOT DETECTED Final   Coronavirus HKU1 NOT DETECTED NOT DETECTED Final   Coronavirus NL63 NOT DETECTED NOT DETECTED Final   Coronavirus OC43 NOT DETECTED NOT DETECTED Final   Metapneumovirus NOT  DETECTED NOT DETECTED Final   Rhinovirus / Enterovirus NOT DETECTED NOT DETECTED Final   Influenza A NOT DETECTED NOT DETECTED Final   Influenza B NOT DETECTED NOT DETECTED Final   Parainfluenza Virus 1 NOT DETECTED NOT DETECTED Final   Parainfluenza Virus 2 NOT DETECTED NOT DETECTED Final   Parainfluenza Virus 3 NOT DETECTED NOT DETECTED Final   Parainfluenza Virus 4 NOT DETECTED NOT DETECTED Final   Respiratory Syncytial Virus NOT DETECTED NOT DETECTED Final   Bordetella pertussis NOT DETECTED NOT DETECTED Final   Chlamydophila pneumoniae NOT DETECTED NOT DETECTED Final   Mycoplasma pneumoniae NOT DETECTED NOT DETECTED Final  Culture, sputum-assessment     Status: None   Collection Time: 08/13/2017  3:14 PM  Result Value Ref Range Status   Specimen Description EXPECTORATED SPUTUM  Final   Special Requests NONE  Final   Sputum evaluation   Final    Sputum specimen not acceptable for testing.  Please recollect.   CALLED TO DANIEL RN AT 1019A ON  [id number] BY THOMPSON S. Performed at [facility], [address], [place], [place] [address]    Report Status 08/31/2017 FINAL  Final  MRSA PCR Screening     Status: None   Collection Time: 08/30/17  7:00 PM  Result Value Ref Range Status   MRSA by PCR NEGATIVE NEGATIVE Final    Comment:        The GeneXpert MRSA Assay (FDA approved for NASAL specimens only), is one component of a comprehensive MRSA colonization surveillance program. It is not intended to diagnose MRSA infection nor to guide or monitor treatment for MRSA infections. Performed at [facility], [address], [place], [place] [address]   Culture, expectorated sputum-assessment     Status: None   Collection Time: 08/31/17  4:50 AM  Result Value Ref Range Status   Specimen Description EXPECTORATED SPUTUM  Final   Special Requests NONE  Final   Sputum evaluation   Final    THIS SPECIMEN IS ACCEPTABLE FOR SPUTUM CULTURE Performed at [facility] Performed at [facility], [address] [place], [place] [address]    Report Status 08/31/2017 FINAL  Final  Culture, respiratory (NON-Expectorated)     Status: None (Preliminary result)   Collection Time: 08/31/17  4:50 AM  Result Value Ref Range Status   Specimen Description   Final    EXPECTORATED SPUTUM Performed at [facility], [address], [place], [place] [address]    Special Requests   Final    NONE Reflexed from [id number] Performed at [facility], [address], [place], [place] [address]    Gram Stain   Final    MODERATE WBC PRESENT,BOTH PMN AND MONONUCLEAR FEW GRAM VARIABLE ROD RARE GRAM POSITIVE COCCI RARE YEAST ABUNDANT SQUAMOUS EPITHELIAL CELLS PRESENT    Culture   Final    CULTURE REINCUBATED FOR BETTER GROWTH Performed at [facility], [address] [place], [place] [address]    Report Status PENDING  Incomplete  MRSA PCR Screening     Status: None   Collection Time: 09/01/17  5:19 PM  Result Value Ref Range Status   MRSA by  PCR NEGATIVE NEGATIVE Final    Comment:        The GeneXpert MRSA Assay (FDA approved for NASAL specimens only), is one component of a comprehensive MRSA colonization surveillance program. It is not intended to diagnose MRSA infection nor to guide or monitor treatment for MRSA infections. Performed at [facility], [address], [place], [place] [address]      Studies: Dg Chest [facility] 1 View  Result Date: 08/31/2017 CLINICAL DATA:  Hypoxia, shortness of breath. Admitted with pneumonia and sepsis. EXAM: PORTABLE CHEST 1 VIEW COMPARISON:  CT chest August 30, 2017 and chest radiograph August 29, 2017 FINDINGS: Diffuse interstitial and alveolar airspace opacities, small suspected pleural effusions. Cardiac silhouette is normal in size. Calcified aortic knob. Fullness of the RIGHT hilum. No pneumothorax. ACDF. IMPRESSION: Similar and alveolar airspace opacities concerning for pneumonia. Fullness of the RIGHT hila corresponding to vascular shadows on prior radiograph. Aortic Atherosclerosis (ICD10-I70.0). Electronically Signed   By: Awilda Metro M.D.   On: 08/31/2017 22:40    Scheduled Meds: . bisacodyl  10 mg Rectal Daily  . budesonide (PULMICORT) nebulizer solution  0.5 mg Nebulization BID  . enoxaparin (LOVENOX) injection  100 mg Subcutaneous Q12H  . feeding supplement (ENSURE ENLIVE)  237 mL Oral BID BM  . furosemide  20 mg Intravenous Daily  . ipratropium-albuterol  3 mL Nebulization TID  . methylPREDNISolone (SOLU-MEDROL) injection  40 mg Intravenous Q8H  . metoprolol tartrate  2.5 mg Intravenous Q8H  . pantoprazole (PROTONIX) IV  40 mg Intravenous Q24H   Continuous Infusions: . meropenem (MERREM) IV Stopped (09/02/17 0551)    Time spent: 35 minutes   Vassie Loll  [facility] Pager [phone]. If 7PM-7AM, please contact night-coverage at [email or web address], password [facility] 09/02/2017, 2:44 PM  LOS: 4 days

## 2017-09-02 NOTE — Progress Notes (Signed)
Patient refused to go on BIPAP stated that it hurts her head too bad. Discussed with RN, will continue to monitor.

## 2017-09-02 NOTE — Progress Notes (Signed)
**Note De-Identified [name] Obfuscation** Patient removed from BIPAP and placed on 15 L HFNC.  Tolerating well at this time.  RRT to continue to monitor

## 2017-09-02 NOTE — Consult Note (Signed)
Consultation Note Date: 09/02/2017   Patient Name: [name]  DOB: 1927/11/08  MRN: [id number]  Age / Sex: 84 y.o., female  PCP: Lisa Antu, DO Referring Physician: Vassie Loll, MD  Reason for Consultation: Establishing goals of care  HPI/Patient Profile: 84 y.o. female  with past medical history significant for CKD stage III, GERD, hypertension, paroxysmal atrial fibrillation on Xarelto, and peripheral vascular disease who presented to the emergency with worsening cough and shortness of breath over the last 2-3 weeks. She has been diagnosed with sepsis secondary to multilobular pneumonia and was initially thought to have some pneumomediastinum, but this appears to have resolved on repeat CT chest with gastrografin on 2/18. acute on chronic CHF,on BIPAP, IV lasix started admitted on 16-Sep-2017 with community-acquired pneumonia.   Clinical Assessment and Goals of Care: [name] is resting quietly in bed.  She greets me making and keeping eye contact.  She is calm and cooperative, pleasant.  She tells me she is tired, and does not have much to say.  Present today at bedside is daughter, [name] who is a Designer, jewellery.  Later in our conversation daughter [name] arrives.  We review labs and images in detail, we talked about [name]'s recent hospitalizations.  We talked about the concept of treat the treatable.  Also, giving more time for improvements or declines.  I share that I will follow-up tomorrow.  No further questions at this time.  Healthcare power of attorney NEXT OF KIN -2 daughters, Lisa Aus, RN and [name].   SUMMARY OF RECOMMENDATIONS   At this point, continue to treat the treatable. Continue CODE STATUS discussions.  Code Status/Advance Care Planning:  Limited code  Symptom Management:   Per hospitalist, no additional needs at this time.  Palliative  Prophylaxis:   Aspiration and Turn Reposition  Additional Recommendations (Limitations, Scope, Preferences):  Continue to treat the treatable, no chest compressions.  Psycho-social/Spiritual:   Desire for further Chaplaincy support:no  Additional Recommendations: Caregiving  Support/Resources and Education on Hospice  Prognosis:   < 6 months, would not be surprising based on 3 hospitalizations in 6 months, frailty, functional status.  Discharge Planning: To be determined, based on outcomes.     Primary Diagnoses: Present on Admission: . CKD (chronic kidney disease) stage 3, GFR 30-59 ml/min (HCC) . Essential hypertension . Chronic diastolic CHF (congestive heart failure) (HCC) . AF (paroxysmal atrial fibrillation) (HCC) . Sepsis (HCC) . Pneumonia   I have reviewed the medical record, interviewed the patient and family, and examined the patient. The following aspects are pertinent.  Past Medical History:  Diagnosis Date  . Arthritis   . Chronic kidney disease   . Depression   . Dysrhythmia 8/13   palpitations/ now resolved  . GERD (gastroesophageal reflux disease)   . History of blood transfusion 2012  . Hypertension   . Peripheral vascular disease (HCC)    varicose veins bilaterally   . Restless legs syndrome    Social History   Socioeconomic History  .  Marital status: Widowed    Spouse name: None  . Number of children: None  . Years of education: None  . Highest education level: None  Social Needs  . Financial resource strain: None  . Food insecurity - worry: None  . Food insecurity - inability: None  . Transportation needs - medical: None  . Transportation needs - non-medical: None  Occupational History  . None  Tobacco Use  . Smoking status: Never Smoker  . Smokeless tobacco: Never Used  Substance and Sexual Activity  . Alcohol use: No  . Drug use: No  . Sexual activity: None  Other Topics Concern  . None  Social History Narrative  . None     Family History  Problem Relation Age of Onset  . Heart attack Father   . Stroke Father   . Hypertension Father   . COPD Mother   . Lung cancer Sister   . Uterine cancer Sister   . Colon polyps Daughter   . Colon cancer Unknown    Scheduled Meds: . bisacodyl  10 mg Rectal Daily  . budesonide (PULMICORT) nebulizer solution  0.5 mg Nebulization BID  . enoxaparin (LOVENOX) injection  100 mg Subcutaneous Q12H  . feeding supplement (ENSURE ENLIVE)  237 mL Oral BID BM  . furosemide  20 mg Intravenous Daily  . ipratropium-albuterol  3 mL Nebulization TID  . methylPREDNISolone (SOLU-MEDROL) injection  40 mg Intravenous Q8H  . metoprolol tartrate  2.5 mg Intravenous Q8H  . pantoprazole (PROTONIX) IV  40 mg Intravenous Q24H   Continuous Infusions: . meropenem (MERREM) IV Stopped (09/02/17 0551)   PRN Meds:.acetaminophen **OR** acetaminophen, albuterol, fluticasone, LORazepam, ondansetron **OR** ondansetron (ZOFRAN) IV Medications Prior to Admission:  Prior to Admission medications   Medication Sig Start Date End Date Taking? Authorizing Provider  acetaminophen-codeine (TYLENOL #4) 300-60 MG tablet Take 1 tablet by mouth every 4 (four) hours as needed for pain.   Yes [provider]  albuterol (VENTOLIN HFA) 108 (90 Base) MCG/ACT inhaler Inhale 2 puffs daily as needed into the lungs for wheezing or shortness of breath.    Yes [provider]  ALPRAZolam (XANAX) 0.25 MG tablet Take 0.25 mg at bedtime as needed by mouth for anxiety or sleep.  09/21/16  Yes [provider]  amiodarone (PACERONE) 200 MG tablet Take 200 mg at bedtime by mouth.    Yes [provider]  amLODipine (NORVASC) 5 MG tablet Take 2.5 mg daily after breakfast by mouth.    Yes [provider]  bisacodyl (DULCOLAX) 10 MG suppository Place 10 mg daily rectally.   Yes [provider]  cetirizine (ZYRTEC) 10 MG tablet Take 10 mg by mouth daily.   Yes [provider]  conjugated estrogens (PREMARIN) vaginal cream Place 1 application at bedtime as needed vaginally.   Yes [provider]  Cyanocobalamin (VITAMIN B-12 IJ) Inject 1 each every 30 (thirty) days as directed.   Yes [provider]  docusate sodium (COLACE) 100 MG capsule Take 100 mg by mouth 2 (two) times daily.   Yes [provider]  estradiol (ESTRACE) 1 MG tablet Take 1 mg by mouth daily.   Yes [provider]  fluticasone (FLONASE) 50 MCG/ACT nasal spray Place 1 spray daily as needed into both nostrils for allergies.    Yes [provider]  metoprolol succinate (TOPROL-XL) 25 MG 24 hr tablet Take 1 tablet (25 mg total) by mouth 2 (two) times daily. 06/07/17  Yes Love,  Evlyn Kanner, PA-C  metoprolol tartrate (LOPRESSOR) 25 MG tablet Take 1 tablet by mouth 2 (two) times daily. 08/05/17  Yes [provider]  oseltamivir (TAMIFLU) 75 MG capsule Take 1 capsule by mouth daily. 08/26/17  Yes [provider]  pantoprazole (PROTONIX) 40 MG tablet Take 40 mg by mouth daily.   Yes [provider]  rOPINIRole (REQUIP) 0.5 MG tablet Take 1 mg by mouth at bedtime.   Yes [provider]  XARELTO 15 MG TABS tablet Take 15 mg daily by mouth.  10/26/16  Yes [provider]   Allergies  Allergen Reactions  . Gabapentin     Cardiac concerns (d/c by cardiologist)  . Hctz [Hydrochlorothiazide]     Dizziness   . Lasix [Furosemide]     fatigue   . Penicillins Hives and Itching  . Tramadol-Acetaminophen     GI upset    Review of Systems  Unable to perform ROS: Age    Physical Exam  Constitutional: She appears ill.  HENT:  Head: Normocephalic and atraumatic.  Pulmonary/Chest: No respiratory distress.  Abdominal: Soft. She exhibits no distension.  Neurological: She is alert.  Skin: Skin is warm and dry.  Nursing note and vitals reviewed.   Vital Signs: BP 138/75   Pulse 89   Temp 98.3 F (36.8 C) (Oral)   Resp  19   Ht 5\' 4"  (1.626 m)   Wt 98.8 kg (217 lb 13 oz)   SpO2 91%   BMI 37.39 kg/m  Pain Assessment: No/denies pain   Pain Score: 0-No pain   SpO2: SpO2: 91 % O2 Device:SpO2: 91 % O2 Flow Rate: .O2 Flow Rate (L/min): 14 L/min  IO: Intake/output summary:   Intake/Output Summary (Last 24 hours) at 09/02/2017 1729 Last data filed at 09/02/2017 1437 Gross per 24 hour  Intake 200 ml  Output 2300 ml  Net -2100 ml    LBM: Last BM Date: 09/02/17 Baseline Weight: Weight: 77.1 kg (170 lb) Most recent weight: Weight: 98.8 kg (217 lb 13 oz)     Palliative Assessment/Data:   Flowsheet Rows     Most Recent Value  Intake Tab  Referral Department  Hospitalist  Unit at Time of Referral  ICU  Palliative Care Primary Diagnosis  Pulmonary  Date Notified  09/01/17  Palliative Care Type  New Palliative care  Reason for referral  Clarify Goals of Care, Psychosocial or Spiritual support  Date of Admission  09/08/2017  Date first seen by Palliative Care  09/02/17  # of days Palliative referral response time  1 Day(s)  # of days IP prior to Palliative referral  3  Clinical Assessment  Palliative Performance Scale Score  30%  Pain Max last 24 hours  Not able to report  Pain Min Last 24 hours  Not able to report  Dyspnea Max Last 24 Hours  Not able to report  Dyspnea Min Last 24 hours  Not able to report  Psychosocial & Spiritual Assessment  Palliative Care Outcomes  Patient/Family meeting held?  Yes  Who was at the meeting?  Patient and daughters at bedside      Time In: 1615 Time Out: 1645 Time Total: 30 minutes Greater than 50%  of this time was spent counseling and coordinating care related to the above assessment and plan.  Signed by: Katheran Awe, NP   Please contact Palliative Medicine Team phone at [phone] for questions and concerns.  For individual provider: See Loretha Stapler

## 2017-09-02 NOTE — Progress Notes (Signed)
PT Cancellation Note  Patient Details Name: [name] MRN: [id number] DOB: 07/04/1928   Cancelled Treatment:    Reason Eval/Treat Not Completed: Medical issues which prohibited therapy.  Presently patient unable to participate with physical therapy due to severe O2 desaturation with activity.  Plan: Discharge physical therapy order and recommend re-order physical therapy when patient medically stable to participate.  Thank you.   2:25 PM, 09/02/17 Ocie BobJames Tenise Stetler, MPT Physical Therapist with [facility] [phone] office [phone] mobile phone

## 2017-09-02 NOTE — Evaluation (Signed)
Clinical/Bedside Swallow Evaluation Patient Details  Name: [name] MRN: [id number] Date of Birth: 1928-05-19  Today's Date: 09/02/2017 Time: SLP Start Time (ACUTE ONLY): 1933 SLP Stop Time (ACUTE ONLY): 1954 SLP Time Calculation (min) (ACUTE ONLY): 21 min  Past Medical History:  Past Medical History:  Diagnosis Date  . Arthritis   . Chronic kidney disease   . Depression   . Dysrhythmia 8/13   palpitations/ now resolved  . GERD (gastroesophageal reflux disease)   . History of blood transfusion 2012  . Hypertension   . Peripheral vascular disease (HCC)    varicose veins bilaterally   . Restless legs syndrome    Past Surgical History:  Past Surgical History:  Procedure Laterality Date  . ABDOMINAL HYSTERECTOMY    . ANTERIOR CERVICAL DECOMP/DISCECTOMY FUSION N/A 05/24/2017   Procedure: ANTERIOR CERVICAL DECOMPRESSION/DISCECTOMY FUSION CERVICAL THREE-FOUR, CERVICAL FIVE-SIX, CERVICAL SIX-SEVEN;  Surgeon: Julio Sicks, MD;  Location: [facility] OR;  Service: Neurosurgery;  Laterality: N/A;  . APPENDECTOMY    . EYE SURGERY Bilateral    cataract extraction with IOL  . JOINT REPLACEMENT Right    knee  . SEPTOPLASTY     repair deviated septum  . TOTAL KNEE ARTHROPLASTY Left 11/07/2012   Procedure: LEFT TOTAL KNEE ARTHROPLASTY;  Surgeon: Loanne Drilling, MD;  Location: [facility] ORS;  Service: Orthopedics;  Laterality: Left;  Marland Kitchen VARICOSE VEIN SURGERY Bilateral    HPI:  84 y.o. female  with past medical history significant for CKD stage III, GERD, hypertension, paroxysmal atrial fibrillation on Xarelto, and peripheral vascular disease who presented to the emergency with worsening cough and shortness of breath over the last 2-3 weeks.  She has been diagnosed with sepsis secondary to multilobular pneumonia and was initially thought to have some pneumomediastinum, but this appears to have resolved on repeat CT chest with gastrografin on 2/18. acute on chronic CHF,on BIPAP, IV lasix started admitted on  September 24, 2017 with community-acquired pneumonia.    Assessment / Plan / Recommendation Clinical Impression  Limited BSE administered this date due to Pt on non-rebreather and on fluid restrictions. Pt was on HFNC earlier, but was changed to NRBM due to desaturation. Oral motor examination WNL with the exception of white coating on tongue which could be related to use of BiPAP, but should be monitored for signs of oral thrush. Pt without overt signs or symptoms of aspiration when presented with thin liquids via straw sips, however intake was limited. Pt reports that she is not hungry, but is thirsty. Daughter relays that Pt apparently had difficulty swallowing potassium pill yesterday, which lead to significant coughing. Recommend ok for thin liquids and present large pills crushed as able in puree or via alternative means. SLP will check back tomorrow for solid food trials. Pt and daughter in agreement with plan of care.     SLP Visit Diagnosis: Dysphagia, oral phase (R13.11)    Aspiration Risk  Mild aspiration risk    Diet Recommendation Thin liquid   Liquid Administration via: Cup;Straw Medication Administration: Crushed with puree Supervision: Staff to assist with self feeding Compensations: Small sips/bites Postural Changes: Seated upright at 90 degrees;Remain upright for at least 30 minutes after po intake    Other  Recommendations Oral Care Recommendations: Oral care BID;Staff/trained caregiver to provide oral care Other Recommendations: Clarify dietary restrictions   Follow up Recommendations None      Frequency and Duration min 2x/week  1 week       Prognosis Prognosis for Safe Diet Advancement: Good  Swallow Study   General Date of Onset: 2018-04-05 HPI: 84 y.o. female  with past medical history significant for CKD stage III, GERD, hypertension, paroxysmal atrial fibrillation on Xarelto, and peripheral vascular disease who presented to the emergency with worsening cough  and shortness of breath over the last 2-3 weeks.  She has been diagnosed with sepsis secondary to multilobular pneumonia and was initially thought to have some pneumomediastinum, but this appears to have resolved on repeat CT chest with gastrografin on 2/18. acute on chronic CHF,on BIPAP, IV lasix started admitted on 11-05-2017 with community-acquired pneumonia.  Type of Study: Bedside Swallow Evaluation Previous Swallow Assessment: MBSS 05/31/18 D2 and thin Diet Prior to this Study: NPO Temperature Spikes Noted: No Respiratory Status: Non-rebreather History of Recent Intubation: No Behavior/Cognition: Alert;Cooperative;Pleasant mood Oral Cavity Assessment: Within Functional Limits(coated tongue) Oral Care Completed by SLP: Yes Oral Cavity - Dentition: Adequate natural dentition Vision: Functional for self-feeding Self-Feeding Abilities: Needs assist Patient Positioning: Upright in bed Baseline Vocal Quality: Normal;Low vocal intensity Volitional Cough: Strong Volitional Swallow: Able to elicit    Oral/Motor/Sensory Function Overall Oral Motor/Sensory Function: Within functional limits   Ice Chips Ice chips: Within functional limits Presentation: Spoon   Thin Liquid Thin Liquid: Within functional limits Presentation: Straw    Nectar Thick Nectar Thick Liquid: Not tested   Honey Thick Honey Thick Liquid: Not tested   Puree Puree: Not tested   Solid   Thank you,  Havery MorosDabney Porter, CCC-SLP [phone]    Solid: Not tested        PORTER,DABNEY 09/02/2017,7:56 PM

## 2017-09-03 DIAGNOSIS — Z7189 Other specified counseling: Secondary | ICD-10-CM

## 2017-09-03 DIAGNOSIS — I5032 Chronic diastolic (congestive) heart failure: Secondary | ICD-10-CM

## 2017-09-03 LAB — CULTURE, BLOOD (ROUTINE X 2)
Culture: NO GROWTH
Culture: NO GROWTH
SPECIAL REQUESTS: ADEQUATE
Special Requests: ADEQUATE

## 2017-09-03 LAB — GLUCOSE, CAPILLARY
GLUCOSE-CAPILLARY: 128 mg/dL — AB (ref 65–99)
GLUCOSE-CAPILLARY: 178 mg/dL — AB (ref 65–99)

## 2017-09-03 LAB — BASIC METABOLIC PANEL
ANION GAP: 10 (ref 5–15)
BUN: 47 mg/dL — AB (ref 6–20)
CALCIUM: 8.6 mg/dL — AB (ref 8.9–10.3)
CO2: 29 mmol/L (ref 22–32)
Chloride: 102 mmol/L (ref 101–111)
Creatinine, Ser: 0.85 mg/dL (ref 0.44–1.00)
GFR calc Af Amer: >60 mL/min (ref >60)
GFR, EST NON AFRICAN AMERICAN: 60 mL/min — AB (ref >60)
GLUCOSE: 152 mg/dL — AB (ref 65–99)
POTASSIUM: 3.8 mmol/L (ref 3.5–5.1)
SODIUM: 141 mmol/L (ref 135–145)

## 2017-09-03 LAB — CULTURE, RESPIRATORY W GRAM STAIN: Culture: NORMAL

## 2017-09-03 LAB — CULTURE, RESPIRATORY

## 2017-09-03 MED ORDER — FUROSEMIDE 10 MG/ML IJ SOLN
20.0000 mg | Freq: Two times a day (BID) | INTRAMUSCULAR | Status: AC
  Administered 2017-09-03 – 2017-09-04 (×3): 20 mg via INTRAVENOUS
  Filled 2017-09-03 (×3): qty 2

## 2017-09-03 MED ORDER — PANTOPRAZOLE SODIUM 40 MG PO TBEC
40.0000 mg | DELAYED_RELEASE_TABLET | Freq: Every day | ORAL | Status: DC
  Administered 2017-09-03 – 2017-09-11 (×9): 40 mg via ORAL
  Filled 2017-09-03 (×9): qty 1

## 2017-09-03 MED ORDER — METOPROLOL TARTRATE 25 MG PO TABS
12.5000 mg | ORAL_TABLET | Freq: Two times a day (BID) | ORAL | Status: DC
  Administered 2017-09-03 – 2017-09-11 (×17): 12.5 mg via ORAL
  Filled 2017-09-03 (×18): qty 1

## 2017-09-03 MED ORDER — ALPRAZOLAM 0.25 MG PO TABS
0.2500 mg | ORAL_TABLET | Freq: Two times a day (BID) | ORAL | Status: DC | PRN
  Administered 2017-09-06 – 2017-09-12 (×5): 0.25 mg via ORAL
  Filled 2017-09-03 (×6): qty 1

## 2017-09-03 MED ORDER — ROPINIROLE HCL 1 MG PO TABS
0.5000 mg | ORAL_TABLET | Freq: Every day | ORAL | Status: DC
  Administered 2017-09-03 – 2017-09-11 (×9): 0.5 mg via ORAL
  Filled 2017-09-03 (×2): qty 0.5
  Filled 2017-09-03 (×3): qty 1
  Filled 2017-09-03: qty 0.5
  Filled 2017-09-03 (×2): qty 1
  Filled 2017-09-03: qty 0.5
  Filled 2017-09-03 (×2): qty 1
  Filled 2017-09-03: qty 0.5

## 2017-09-03 MED ORDER — AMIODARONE HCL 200 MG PO TABS
200.0000 mg | ORAL_TABLET | Freq: Every day | ORAL | Status: DC
  Administered 2017-09-03 – 2017-09-11 (×9): 200 mg via ORAL
  Filled 2017-09-03 (×10): qty 1

## 2017-09-03 NOTE — Progress Notes (Signed)
ANTICOAGULATION CONSULT NOTE   Pharmacy Consult for lovenox Indication: atrial fibrillation  Allergies  Allergen Reactions  . Gabapentin     Cardiac concerns (d/c by cardiologist)  . Hctz [Hydrochlorothiazide]     Dizziness   . Lasix [Furosemide]     fatigue   . Penicillins Hives and Itching  . Tramadol-Acetaminophen     GI upset    Patient Measurements: Height: 5\' 4"  (162.6 cm) Weight: 214 lb 8.1 oz (97.3 kg) IBW/kg (Calculated) : 54.7  Vital Signs: Temp: 97.5 F (36.4 C) (02/22 0820) Temp Source: Axillary (02/22 0820) BP: 155/78 (02/22 0400) Pulse Rate: 73 (02/22 0400)  Labs: Recent Labs    08/31/17 2201 09/01/17 0428 09/02/17 0517 09/02/17 2336 09/03/17 0435  HGB 14.0 13.2 12.5 12.4  --   HCT 42.2 39.5 38.4 37.9  --   PLT 242 197 165 177  --   CREATININE 0.92 1.04* 0.86  --  0.85  TROPONINI 0.09* 0.07*  --   --   --    Estimated Creatinine Clearance: 52.8 mL/min (by C-G formula based on SCr of 0.85 mg/dL).  Medical History: Past Medical History:  Diagnosis Date  . Arthritis   . Chronic kidney disease   . Depression   . Dysrhythmia 8/13   palpitations/ now resolved  . GERD (gastroesophageal reflux disease)   . History of blood transfusion 2012  . Hypertension   . Peripheral vascular disease (HCC)    varicose veins bilaterally   . Restless legs syndrome    Medications:  Medications Prior to Admission  Medication Sig Dispense Refill Last Dose  . acetaminophen-codeine (TYLENOL #4) 300-60 MG tablet Take 1 tablet by mouth every 4 (four) hours as needed for pain.   09/03/2017 at Unknown time  . albuterol (VENTOLIN HFA) 108 (90 Base) MCG/ACT inhaler Inhale 2 puffs daily as needed into the lungs for wheezing or shortness of breath.    08/28/2017 at Unknown time  . ALPRAZolam (XANAX) 0.25 MG tablet Take 0.25 mg at bedtime as needed by mouth for anxiety or sleep.    08/28/2017 at Unknown time  . amiodarone (PACERONE) 200 MG tablet Take 200 mg at bedtime by  mouth.    08/28/2017 at Unknown time  . amLODipine (NORVASC) 5 MG tablet Take 2.5 mg daily after breakfast by mouth.    08/28/2017 at Unknown time  . bisacodyl (DULCOLAX) 10 MG suppository Place 10 mg daily rectally.   Past Month at Unknown time  . cetirizine (ZYRTEC) 10 MG tablet Take 10 mg by mouth daily.   08/28/2017 at Unknown time  . conjugated estrogens (PREMARIN) vaginal cream Place 1 application at bedtime as needed vaginally.   Past Week at Unknown time  . Cyanocobalamin (VITAMIN B-12 IJ) Inject 1 each every 30 (thirty) days as directed.   Past Month at Unknown time  . docusate sodium (COLACE) 100 MG capsule Take 100 mg by mouth 2 (two) times daily.   08/28/2017 at Unknown time  . estradiol (ESTRACE) 1 MG tablet Take 1 mg by mouth daily.   08/28/2017 at Unknown time  . fluticasone (FLONASE) 50 MCG/ACT nasal spray Place 1 spray daily as needed into both nostrils for allergies.    08/28/2017 at Unknown time  . metoprolol succinate (TOPROL-XL) 25 MG 24 hr tablet Take 1 tablet (25 mg total) by mouth 2 (two) times daily. 60 tablet 0 08/28/2017 at 1800  . metoprolol tartrate (LOPRESSOR) 25 MG tablet Take 1 tablet by mouth 2 (two) times daily.  08/28/2017 at 1800  . oseltamivir (TAMIFLU) 75 MG capsule Take 1 capsule by mouth daily.   08/28/2017 at Unknown time  . pantoprazole (PROTONIX) 40 MG tablet Take 40 mg by mouth daily.   08/28/2017 at Unknown time  . rOPINIRole (REQUIP) 0.5 MG tablet Take 1 mg by mouth at bedtime.   08/28/2017 at Unknown time  . XARELTO 15 MG TABS tablet Take 15 mg daily by mouth.    08/28/2017 at Unknown time   Assessment: 84 year old female with history significant for CKD stage III, GERD, hypertension, paroxysmal atrial fibrillation on Xarelto. Pharmacy asked to transition to lovenox for afib.  Goal of Therapy:  Monitor platelets by anticoagulation protocol: Yes   Plan:  Lovenox 1mg /kg (100mg ) sq q12h Monitor for s/s of bleeding CBC MWF  Valrie HartScott Daeshaun Specht, PharmD Clinical  Pharmacist Pager:  [phone] 09/03/2017  09/03/2017,9:57 AM

## 2017-09-03 NOTE — Progress Notes (Signed)
Spoke with Lillia Carmelasha Dove, NP regarding patient agreeing to wear BiPAP at night and placing monitor on comfort screen at night to allow better rest for patient. All staff will be made aware that patient is not Comfort Care. Only screen will be made comfort at night for patient comfort at night. Nurse aware and staff will be made aware of code status change from partial code to DNR with agreement to still wear BiPAP at night.

## 2017-09-03 NOTE — Progress Notes (Signed)
Daily Progress Note   Patient Name: [name]       Date: 09/03/2017 DOB: 06/22/1928  Age: 84 y.o. MRN#: [id number] Attending Physician: Vassie LollMadera, Carlos, MD Primary Care Physician: Lenell AntuLe, Thao P, DO Admit Date: 2017/09/07  Reason for Consultation/Follow-up: Establishing goals of care and Psychosocial/spiritual support  Subjective: [name] is resting quietly in bed.  She greets me making and keeping eye contact.  She looks improved, and is able to communicate better today.  Present today at bedside is daughter, [name].   We talked about [name] improvement.  Daughter states that she did not sleep well overnight due to beeping and noise coming from the monitor.  We discussed the possibility of placing the monitor in the room on comfort screen so that [name] is not disturbed.  I worry for ICU Psychosis.  Comfort screen at night while [name] is on BiPAP discussed with nursing staff.  We talked about PT evaluation when [name] has improved.  Patient and daughter state that she may be willing to have rehab services if necessary.  We talked about CODE STATUS.  [name] states that her mother did not understand earlier today, but DOES want the use of BiPAP as needed.  She does not want intubation.  They are electing DNR.  Both [name] and her daughter endorse this.  Respiratory therapist in room at time of discussion.  Nursing staff notified, changes made to electronic chart.  Length of Stay: 5  Current Medications: Scheduled Meds:  . bisacodyl  10 mg Rectal Daily  . budesonide (PULMICORT) nebulizer solution  0.5 mg Nebulization BID  . enoxaparin (LOVENOX) injection  100 mg Subcutaneous Q12H  . feeding supplement (ENSURE ENLIVE)  237 mL Oral BID BM  . furosemide  20 mg  Intravenous Daily  . ipratropium-albuterol  3 mL Nebulization TID  . methylPREDNISolone (SOLU-MEDROL) injection  40 mg Intravenous Q8H  . metoprolol tartrate  2.5 mg Intravenous Q8H  . pantoprazole (PROTONIX) IV  40 mg Intravenous Q24H    Continuous Infusions: . meropenem (MERREM) IV Stopped (09/03/17 0540)    PRN Meds: acetaminophen **OR** acetaminophen, albuterol, fluticasone, LORazepam, ondansetron **OR** ondansetron (ZOFRAN) IV  Physical Exam  Nursing note and vitals reviewed.           Vital Signs: BP 124/75  Pulse 83   Temp 97.8 F (36.6 C) (Axillary)   Resp (!) 21   Ht 5\' 4"  (1.626 m)   Wt 97.3 kg (214 lb 8.1 oz)   SpO2 97%   BMI 36.82 kg/m  SpO2: SpO2: 97 % O2 Device: O2 Device: Nasal Cannula, High Flow Nasal Cannula O2 Flow Rate: O2 Flow Rate (L/min): 15 L/min  Intake/output summary:   Intake/Output Summary (Last 24 hours) at 09/03/2017 1524 Last data filed at 09/03/2017 1408 Gross per 24 hour  Intake 340 ml  Output 2050 ml  Net -1710 ml   LBM: Last BM Date: 09/02/17 Baseline Weight: Weight: 77.1 kg (170 lb) Most recent weight: Weight: 97.3 kg (214 lb 8.1 oz)       Palliative Assessment/Data:    Flowsheet Rows     Most Recent Value  Intake Tab  Referral Department  Hospitalist  Unit at Time of Referral  ICU  Palliative Care Primary Diagnosis  Pulmonary  Date Notified  09/01/17  Palliative Care Type  New Palliative care  Reason for referral  Clarify Goals of Care, Psychosocial or Spiritual support  Date of Admission  08/27/2017  Date first seen by Palliative Care  09/02/17  # of days Palliative referral response time  1 Day(s)  # of days IP prior to Palliative referral  3  Clinical Assessment  Palliative Performance Scale Score  30%  Pain Max last 24 hours  Not able to report  Pain Min Last 24 hours  Not able to report  Dyspnea Max Last 24 Hours  Not able to report  Dyspnea Min Last 24 hours  Not able to report  Psychosocial & Spiritual  Assessment  Palliative Care Outcomes  Patient/Family meeting held?  Yes  Who was at the meeting?  Patient and daughters at bedside      Patient Active Problem List   Diagnosis Date Noted  . Community acquired pneumonia   . Goals of care, counseling/discussion   . Palliative care by specialist   . Pneumonia 08/30/2017  . Acute hypoxemic respiratory failure (HCC) 09/04/2017  . Multifocal pneumonia 09/05/2017  . Elevated troponin level 08/16/2017  . Sepsis (HCC) 09/07/2017  . Hemorrhoids   . Constipation due to pain medication   . Chronic low back pain with sciatica   . Benign essential HTN   . Paroxysmal atrial fibrillation    . Chronic constipation   . Gastroesophageal reflux disease   . RLS (restless legs syndrome)   . DDD (degenerative disc disease), cervical   . Cervical stenosis of spine 05/24/2017  . Cervical myelopathy (HCC) 05/24/2017  . AF (paroxysmal atrial fibrillation) (HCC) 05/24/2017  . Chronic diastolic CHF (congestive heart failure) (HCC) 05/24/2017  . CKD (chronic kidney disease) stage 3, GFR 30-59 ml/min (HCC) 05/24/2017  . Essential hypertension 05/24/2017  . Right sided weakness 05/22/2017  . Neuropathic pain 05/26/2013  . Restless leg syndrome 05/26/2013  . OA (osteoarthritis) of knee 11/07/2012    Palliative Care Assessment & Plan   Patient Profile: 84 y.o. female  with past medical history significant for CKD stage III, GERD, hypertension, paroxysmal atrial fibrillation on Xarelto, and peripheral vascular disease who presented to the emergency with worsening cough and shortness of breath over the last 2-3 weeks. She has been diagnosed with sepsis secondary to multilobular pneumonia and was initially thought to have some pneumomediastinum, but this appears to have resolved on repeat CT chest with gastrografin on 2/18. acute on chronic CHF,on BIPAP, IV lasix started admitted  on 2017/09/15 with community-acquired pneumonia.    Assessment: community-acquired pneumonia:  [name] seems to be improving physically with the benefits of IV fluids and antibiotics.  She wishes to continue as needed use of BiPAP.  Recommendations/Plan:  Continue to treat the treatable.  No CPR, no intubation.  Okay for as needed BiPAP.  Goals of Care and Additional Recommendations:  Limitations on Scope of Treatment: Treat the treatable but no CPR, no intubation.  Code Status:    Code Status Orders  (From admission, onward)        Start     Ordered   September 15, 2017 1512  Limited resuscitation (code)  Continuous    Question Answer Comment  In the event of cardiac or respiratory ARREST: Initiate Code Blue, Call Rapid Response No   In the event of cardiac or respiratory ARREST: Perform CPR No   In the event of cardiac or respiratory ARREST: Perform Intubation/Mechanical Ventilation Yes   In the event of cardiac or respiratory ARREST: Use NIPPV/BiPAp only if indicated Yes   In the event of cardiac or respiratory ARREST: Administer ACLS medications if indicated No   In the event of cardiac or respiratory ARREST: Perform Defibrillation or Cardioversion if indicated No   Comments Pt would like intubation if needed for respiratory issues; no cardiac resuscitation      Sep 15, 2017 1520    Code Status History    Date Active Date Inactive Code Status Order ID Comments User Context   05/27/2017 09:18 06/07/2017 16:15 DNR [id number]  Jacquelynn Cree, PA-C Inpatient   05/26/2017 17:45 05/27/2017 09:17 DNR [id number]  Jacquelynn Cree, PA-C Inpatient   05/22/2017 02:46 05/26/2017 17:38 DNR [id number]  Carron Curie, MD Inpatient   05/26/2013 20:11 05/27/2013 22:49 Full Code [id number]  Drema Dallas, MD Inpatient   11/07/2012 13:15 11/09/2012 17:38 Full Code [id number]  Loanne Drilling, MD Inpatient    Advance Directive Documentation     Most Recent Value  Type of Advance Directive  Healthcare Power of Attorney, Living will  Pre-existing out of  facility DNR order (yellow form or pink MOST form)  No data  "MOST" Form in Place?  No data       Prognosis:   < 6 months would not be surprising based on 3 hospitalizations in 6 months, frailty, functional status.  Discharge Planning:  To Be Determined  Care plan was discussed with nursing staff, respiratory therapy, Dr. Gwenlyn Perking on next rounds  Thank you for allowing the Palliative Medicine Team to assist in the care of this patient.   Time In: 1425 Time Out: 1450 Total Time 25 minutes Prolonged Time Billed  no       Greater than 50%  of this time was spent counseling and coordinating care related to the above assessment and plan.  Katheran Awe, NP  Please contact Palliative Medicine Team phone at [phone] for questions and concerns.

## 2017-09-03 NOTE — Progress Notes (Signed)
Pharmacy Antibiotic Note  [name] is a 84 y.o. female admitted on 08/27/2017 with HCAP.  Pharmacy has been consulted for meropenem dosing. Day#4 of Merrem, patient improving.   Plan: Continue Meropenem 1000 mg IV Q12 hours Monitor labs, cultures, and patient improvement F/u deescalation to po abx  Height: 5\' 4"  (162.6 cm) Weight: 214 lb 8.1 oz (97.3 kg) IBW/kg (Calculated) : 54.7  Temp (24hrs), Avg:97.9 F (36.6 C), Min:97.5 F (36.4 C), Max:98.3 F (36.8 C)  Recent Labs  Lab 08/30/17 0243 08/31/17 0518 08/31/17 2201 09/01/17 0040 09/01/17 0428 09/02/17 0517 09/02/17 2336 09/03/17 0435  WBC 7.4 16.5* 18.6*  --  15.6* 9.6 12.2*  --   CREATININE 0.84 0.89 0.92  --  1.04* 0.86  --  0.85  LATICACIDVEN 1.9 1.5 2.1* 2.3* 1.9  --   --   --     Estimated Creatinine Clearance: 52.8 mL/min (by C-G formula based on SCr of 0.85 mg/dL).    Allergies  Allergen Reactions  . Gabapentin     Cardiac concerns (d/c by cardiologist)  . Hctz [Hydrochlorothiazide]     Dizziness   . Lasix [Furosemide]     fatigue   . Penicillins Hives and Itching  . Tramadol-Acetaminophen     GI upset    Antimicrobials this admission: ceftriaxone 2/17 >> 2/17 Azithromycin 2/17>>2/17 Meropenem 2/17 >>   Dose adjustments this admission: Meropenem renally adjusted to every 12 hours  Microbiology results: 2/17 BCx: ngtd 2/17 Sputum: not acceptable for testing 2/17 MRSA PCR: negative  Thank you for allowing pharmacy to be a part of this patient's care.  [name], PharmD Clinical Pharmacist Pager:  [phone] 09/03/2017   09/03/2017 9:30 AM

## 2017-09-03 NOTE — Progress Notes (Signed)
[facility] PROGRESS NOTE  [name] ZOX:[id number] DOB: 04-21-1928 DOA: 08/19/2017 PCP: Lenell Antu, DO  Interim summary and HPI 84 year old female with history significant for CKD stage III, GERD, hypertension, paroxysmal atrial fibrillation on Xarelto, and peripheral vascular disease who presented to the emergency with worsening cough and shortness of breath over the last 2-3 weeks.  She has been diagnosed with sepsis secondary to multilobular pneumonia and was initially thought to have some pneumomediastinum, but this appears to have resolved on repeat CT chest with gastrografin on 2/18. Case complicated with acute on chronic CHF now. Started on BIPAP and treatment with IV lasix started. Patient transferred to stepdown.  Assessment/Plan: 1-acute hypoxemic resp failure: in the setting of multifocal PNA, RAD and now acute on chronic diastolic CHF -Continue antibiotics (patient on meropenem day 6 out of 7) -Continue duonebs, continue Pulmicort, and steroids for acute RAD. -continue Daily weights, strict intake and output and IV diuretics -appreciate Palliative care consult; will follow on further rec's for advance planning and GOC clarification.  -After discussing with family members will treat what is treatable and if possible avoid heroic interventions.  Patient currently DNR -Start the use of flutter valve and continue weaning oxygen as tolerated. -Appreciate speech therapy evaluation, advance diet as recommended. -Transition medications to p.o.  2-Atrial fibrillation -continue lopressor for rate control, given IV currently. -Continue holding xarelto and if able to tolerate PO's will resume xarelto tomorrow. -Appreciate pharmacy assistance for Lovenox dosing   -CHADsVASC score 4  3-acute on chronic diastolic HF -Continue IV Lasix, but increased to 20 q12hr; patient still with crackles on exam. -Continue daily weights and strict intake and output -Echo demonstrated preserved  ejection fraction and grade 1 diastolic dysfunction.  4-elevated troponin -in the setting of demand ischemia  -echo reassuring with preserved ejection fraction and w/o wall motion abnormalities  -No acute ischemic changes appreciated on telemetry or EKG. -Patient remains chest pain-free.  5-hypokalemia -Continue to monitor electrolytes while receiving diuresis. -To further replete as needed based on level trend.  6-GERD -Continue PPI; transition back to oral regimen as per home medication list  7-CKD stage 3 -Creatinine 0.85 (GFR at baseline) -Follow closely with acute diuresis. -Patient with good urine output  Code Status: DNR (and not looking to further use BIPAP if needed) Family Communication: daughter at bedside  Disposition Plan: remains in stepdown, continue treatment with IV antibiotics, continue IV  lasix, change meds to PO route and advance diet as recommended by speech therapy. Repeat CXR in am.  Consultants:  Palliative Care  Procedures:  2-D echo -preserved EF -grade 1 diastolic HF -no wall motion abnormalities   Antibiotics:  Ceftriaxone and Zithromax 2/17>>2/17  Meropenem 2/17>>  HPI/Subjective: No fever and overall feeling better.  Reports breathing is easier.  Patient has not required BiPAP since yesterday morning but this is still on 15 L high flow nasal cannula.  Denies chest pain or palpitations.  Objective: Vitals:   09/03/17 1400 09/03/17 1500  BP: (!) 130/91 124/75  Pulse: 87 83  Resp: (!) 24 (!) 21  Temp:    SpO2: 91% 97%    Intake/Output Summary (Last 24 hours) at 09/03/2017 1633 Last data filed at 09/03/2017 1408 Gross per 24 hour  Intake 340 ml  Output 2050 ml  Net -1710 ml   Filed Weights   09/01/17 1533 09/02/17 0500 09/03/17 0500  Weight: 100.5 kg (221 lb 9 oz) 98.8 kg (217 lb 13 oz) 97.3 kg (214 lb 8.1 oz)  Exam:   General: no fever, overall feeling improved. Off BIPAP, but requiring 15L high flow oxygen  supplementation.  Cardiovascular: No JVD appreciated, no rubs, no gallops, positive systolic ejection murmur.  Respiratory: Improvement in her air movement appreciated, no using accessory muscles, fine crackles at the bases is still appreciated, mild expiratory wheezing and positive rhonchi heard on exam.  Abdomen: Soft, nontender, nondistended, positive bowel sounds.  Musculoskeletal: Trace edema bilaterally, TED hoses in place, no cyanosis or clubbing.  Data Reviewed: Basic Metabolic Panel: Recent Labs  Lab 08/31/17 0518 08/31/17 2201 09/01/17 0428 09/02/17 0517 09/03/17 0435  NA 135 138 139 141 141  K 3.8 3.6 3.3* 3.9 3.8  CL 104 103 102 103 102  CO2 20* 21* 24 28 29   GLUCOSE 147* 166* 167* 142* 152*  BUN 37* 42* 42* 42* 47*  CREATININE 0.89 0.92 1.04* 0.86 0.85  CALCIUM 8.7* 9.1 8.8* 8.7* 8.6*  MG  --  2.0  --   --   --   PHOS  --  2.9  --   --   --    Liver Function Tests: Recent Labs  Lab 2017/08/30 0954 08/30/17 0243 08/31/17 2201  AST 46* 44* 39  ALT 30 29 39  ALKPHOS 94 96 132*  BILITOT 0.9 0.6 0.7  PROT 5.5* 5.4* 6.8  ALBUMIN 2.4* 2.3* 2.8*   CBC: Recent Labs  Lab 2017/08/30 0954  08/31/17 0518 08/31/17 2201 09/01/17 0428 09/02/17 0517 09/02/17 2336  WBC 8.8   < > 16.5* 18.6* 15.6* 9.6 12.2*  NEUTROABS 7.5  --   --  16.4  --   --   --   HGB 12.5   < > 12.6 14.0 13.2 12.5 12.4  HCT 37.8   < > 38.4 42.2 39.5 38.4 37.9  MCV 89.6   < > 89.3 89.2 87.6 90.1 89.8  PLT 160   < > 198 242 197 165 177   < > = values in this interval not displayed.   Cardiac Enzymes: Recent Labs  Lab 08-30-17 1530 2017/08/30 2108 08/30/17 0243 08/31/17 2201 09/01/17 0428  TROPONINI 0.23* 0.19* 0.15* 0.09* 0.07*   BNP (last 3 results) Recent Labs    August 30, 2017 0954 08/31/17 2201  BNP 189.0* 626.0*    CBG: Recent Labs  Lab 08/30/17 0755 08/31/17 0807 09/01/17 0449 09/03/17 0757 09/03/17 1159  GLUCAP 158* 229* 151* 128* 178*    Recent Results (from the past  240 hour(s))  Culture, blood (Routine x 2)     Status: None   Collection Time: 2017-08-30  9:56 AM  Result Value Ref Range Status   Specimen Description BLOOD RIGHT HAND  Final   Special Requests   Final    BOTTLES DRAWN AEROBIC AND ANAEROBIC Blood Culture adequate volume   Culture   Final    NO GROWTH 5 DAYS Performed at [facility], [address], [place], [place] [address]    Report Status 09/03/2017 FINAL  Final  Culture, blood (Routine x 2)     Status: None   Collection Time: 08-30-2017  9:58 AM  Result Value Ref Range Status   Specimen Description RIGHT ANTECUBITAL  Final   Special Requests   Final    BOTTLES DRAWN AEROBIC AND ANAEROBIC Blood Culture adequate volume   Culture   Final    NO GROWTH 5 DAYS Performed at [facility], [address] [place], [place] [address]    Report Status 09/03/2017 FINAL  Final  Respiratory Panel by  PCR     Status: None   Collection Time: 09/08/2017 12:31 PM  Result Value Ref Range Status   Adenovirus NOT DETECTED NOT DETECTED Final   Coronavirus 229E NOT DETECTED NOT DETECTED Final   Coronavirus HKU1 NOT DETECTED NOT DETECTED Final   Coronavirus NL63 NOT DETECTED NOT DETECTED Final   Coronavirus OC43 NOT DETECTED NOT DETECTED Final   Metapneumovirus NOT DETECTED NOT DETECTED Final   Rhinovirus / Enterovirus NOT DETECTED NOT DETECTED Final   Influenza A NOT DETECTED NOT DETECTED Final   Influenza B NOT DETECTED NOT DETECTED Final   Parainfluenza Virus 1 NOT DETECTED NOT DETECTED Final   Parainfluenza Virus 2 NOT DETECTED NOT DETECTED Final   Parainfluenza Virus 3 NOT DETECTED NOT DETECTED Final   Parainfluenza Virus 4 NOT DETECTED NOT DETECTED Final   Respiratory Syncytial Virus NOT DETECTED NOT DETECTED Final   Bordetella pertussis NOT DETECTED NOT DETECTED Final   Chlamydophila pneumoniae NOT DETECTED NOT DETECTED Final   Mycoplasma pneumoniae NOT DETECTED NOT DETECTED Final  Culture, sputum-assessment     Status: None   Collection  Time: 08/14/2017  3:14 PM  Result Value Ref Range Status   Specimen Description EXPECTORATED SPUTUM  Final   Special Requests NONE  Final   Sputum evaluation   Final    Sputum specimen not acceptable for testing.  Please recollect.   CALLED TO DANIEL RN AT [id number] ON [id number] BY THOMPSON S. Performed at [facility], [address] [place], [place] [address]    Report Status 08/31/2017 FINAL  Final  MRSA PCR Screening     Status: None   Collection Time: 08/30/17  7:00 PM  Result Value Ref Range Status   MRSA by PCR NEGATIVE NEGATIVE Final    Comment:        The GeneXpert MRSA Assay (FDA approved for NASAL specimens only), is one component of a comprehensive MRSA colonization surveillance program. It is not intended to diagnose MRSA infection nor to guide or monitor treatment for MRSA infections. Performed at [facility], [address], [place], [place] [address]   Culture, expectorated sputum-assessment     Status: None   Collection Time: 08/31/17  4:50 AM  Result Value Ref Range Status   Specimen Description EXPECTORATED SPUTUM  Final   Special Requests NONE  Final   Sputum evaluation   Final    THIS SPECIMEN IS ACCEPTABLE FOR SPUTUM CULTURE Performed at [facility] Performed at [facility], [address] [place], [place] [address]    Report Status 08/31/2017 FINAL  Final  Culture, respiratory (NON-Expectorated)     Status: None   Collection Time: 08/31/17  4:50 AM  Result Value Ref Range Status   Specimen Description   Final    EXPECTORATED SPUTUM Performed at [facility], [address], [place], [place] [address]    Special Requests   Final    NONE Reflexed from [id number] Performed at [facility], [address] [place], [place] [address]    Gram Stain   Final    MODERATE WBC PRESENT,BOTH PMN AND MONONUCLEAR FEW GRAM VARIABLE ROD RARE GRAM POSITIVE COCCI RARE YEAST ABUNDANT SQUAMOUS EPITHELIAL CELLS PRESENT    Culture   Final    Consistent with normal  respiratory flora. Performed at [facility], [address] [place], [place] [address]    Report Status 09/03/2017 FINAL  Final  MRSA PCR Screening     Status: None   Collection Time: 09/01/17  5:19 PM  Result Value Ref Range Status   MRSA by PCR NEGATIVE NEGATIVE Final    Comment:        The GeneXpert MRSA Assay (FDA approved for NASAL specimens only), is one component of a comprehensive MRSA colonization surveillance program. It is not intended to diagnose MRSA infection nor to guide or monitor treatment for MRSA infections. Performed at [facility], [address] [place], [place] [address]      Studies: No results found.  Scheduled Meds: . bisacodyl  10 mg Rectal Daily  . budesonide (PULMICORT) nebulizer solution  0.5 mg Nebulization BID  . enoxaparin (LOVENOX) injection  100 mg Subcutaneous Q12H  . feeding supplement (ENSURE ENLIVE)  237 mL Oral BID BM  . furosemide  20 mg Intravenous Q12H  . ipratropium-albuterol  3 mL Nebulization TID  . methylPREDNISolone (SOLU-MEDROL) injection  40 mg Intravenous Q8H  . metoprolol tartrate  2.5 mg Intravenous Q8H  . pantoprazole (PROTONIX) IV  40 mg Intravenous Q24H   Continuous Infusions: . meropenem (MERREM) IV Stopped (09/03/17 0540)    Time spent: 30 minutes   Vassie Lollarlos Peniel Biel  [facility] Pager [phone]. If 7PM-7AM, please contact night-coverage at [email or web address], password [email or web address] 09/03/2017, 4:33 PM  LOS: 5 days

## 2017-09-04 ENCOUNTER — Inpatient Hospital Stay (HOSPITAL_COMMUNITY): Payer: Medicare Other

## 2017-09-04 LAB — BASIC METABOLIC PANEL
Anion gap: 11 (ref 5–15)
BUN: 45 mg/dL — ABNORMAL HIGH (ref 6–20)
CO2: 31 mmol/L (ref 22–32)
CREATININE: 0.9 mg/dL (ref 0.44–1.00)
Calcium: 8.5 mg/dL — ABNORMAL LOW (ref 8.9–10.3)
Chloride: 98 mmol/L — ABNORMAL LOW (ref 101–111)
GFR, EST NON AFRICAN AMERICAN: 56 mL/min — AB (ref >60)
Glucose, Bld: 125 mg/dL — ABNORMAL HIGH (ref 65–99)
Potassium: 3.3 mmol/L — ABNORMAL LOW (ref 3.5–5.1)
SODIUM: 140 mmol/L (ref 135–145)

## 2017-09-04 LAB — GLUCOSE, CAPILLARY: GLUCOSE-CAPILLARY: 137 mg/dL — AB (ref 65–99)

## 2017-09-04 MED ORDER — FLUTICASONE PROPIONATE 50 MCG/ACT NA SUSP
1.0000 | Freq: Every day | NASAL | Status: DC
  Administered 2017-09-05 – 2017-09-11 (×7): 1 via NASAL
  Filled 2017-09-04: qty 16

## 2017-09-04 NOTE — Progress Notes (Signed)
Patient stated she felt ok and did not wish to wear BiPAP as it hurt her nose. She appears ok at this time will not place BiPAP. BiPAP in room

## 2017-09-04 NOTE — Progress Notes (Signed)
[facility] PROGRESS NOTE  [name] UJW:[id number] DOB: 29-Jan-1928 DOA: 08/26/2017 PCP: Lenell Antu, DO  Interim summary and HPI 84 year old female with history significant for CKD stage III, GERD, hypertension, paroxysmal atrial fibrillation on Xarelto, and peripheral vascular disease who presented to the emergency with worsening cough and shortness of breath over the last 2-3 weeks.  She has been diagnosed with sepsis secondary to multilobular pneumonia and was initially thought to have some pneumomediastinum, but this appears to have resolved on repeat CT chest with gastrografin on 2/18. Case complicated with acute on chronic CHF now. Started on BIPAP and treatment with IV lasix started. Patient transferred to stepdown.  Assessment/Plan: 1-acute hypoxemic resp failure: in the setting of multifocal PNA, RAD and now acute on chronic diastolic CHF -Continue antibiotics (patient on meropenem day 7 out of 7) -Continue duonebs, continue Pulmicort, and steroids for acute RAD and potential underlying non-diagnosed COPD. -Repeat chest x-ray demonstrating diffuse bilateral pulmonary opacities with some improvement in aeration. -continue Daily weights, strict intake and output and IV diuretics -appreciate Palliative care consult; will follow on further rec's for advance planning and GOC clarification.  Patient now full DNR, with instructions to not pursuit the use of BiPAP if needed. -After discussing with family members will treat what is treatable and if possible avoid heroic interventions.   -continue flutter valve, increase activity and wean oxygen as tolerated -tolerating diet and PO meds.   2-Atrial fibrillation -Continue Lopressor and pacerone for rate control -CHADsVASC score 4 -currently using lovenox; if meds and diet well tolerated, will resume xarelto on 2/24  3-acute on chronic diastolic HF -Continue IV Lasix -Follow daily weights, strict intake and output  -Patient  reporting good urine output and currently without any signs of edema of the legs. -Echo demonstrated preserved ejection fraction and grade 1 diastolic dysfunction.  4-elevated troponin -in the setting of demand ischemia  -echo reassuring with preserved ejection fraction and w/o wall motion abnormalities  -No acute ischemic changes appreciated on telemetry or EKG. -Patient remains chest pain-free.  5-hypokalemia -Continue to monitor electrolytes while receiving diuresis. -Will replete as needed  6-GERD -Continue PPI  7-CKD stage 3 -Stable and at baseline -Follow creatinine trend -Patient reporting good urine output.  Code Status: DNR (and not looking to further use BIPAP if needed) Family Communication: daughter at bedside  Disposition Plan: Will transfer to telemetry bed; complete treatment with IV antibiotics, continue IV  lasix, change meds to PO route and advance diet as recommended by speech therapy.   Consultants:  Palliative Care  Procedures:  2-D echo -preserved EF -grade 1 diastolic HF -no wall motion abnormalities   Antibiotics:  Ceftriaxone and Zithromax 2/17>>2/17  Meropenem 2/17>>  HPI/Subjective: No fever, no chest pain reports feeling a lot better.  Still requiring high flow oxygen supplementation and reporting shortness of breath with exertion and desaturation while sleeping.  Objective: Vitals:   09/04/17 0914 09/04/17 0938  BP:  116/79  Pulse:  88  Resp:    Temp:    SpO2: 95%     Intake/Output Summary (Last 24 hours) at 09/04/2017 1059 Last data filed at 09/04/2017 5621 Gross per 24 hour  Intake 540 ml  Output 2800 ml  Net -2260 ml   Filed Weights   09/02/17 0500 09/03/17 0500 09/04/17 0400  Weight: 98.8 kg (217 lb 13 oz) 97.3 kg (214 lb 8.1 oz) 95.3 kg (210 lb 1.6 oz)    Exam:   General: No fever, feeling much better.  Using 15 L high flow oxygen supplementation.  No nausea, no vomiting, no chest pain.    Cardiovascular: S1 and  S2, no rubs, no gallops, no JVD.  Positive systolic ejection murmur.  Respiratory: Scattered rhonchi and very fine crackles at the bases, no using accessory muscles and otherwise with improved air movement.  Abdomen: Soft, nontender, nondistended, positive bowel sounds.    Musculoskeletal: Trace edema bilaterally, TED hoses in place, no cyanosis or clubbing.    Data Reviewed: Basic Metabolic Panel: Recent Labs  Lab 08/31/17 2201 09/01/17 0428 09/02/17 0517 09/03/17 0435 09/04/17 0448  NA 138 139 141 141 140  K 3.6 3.3* 3.9 3.8 3.3*  CL 103 102 103 102 98*  CO2 21* 24 28 29 31   GLUCOSE 166* 167* 142* 152* 125*  BUN 42* 42* 42* 47* 45*  CREATININE 0.92 1.04* 0.86 0.85 0.90  CALCIUM 9.1 8.8* 8.7* 8.6* 8.5*  MG 2.0  --   --   --   --   PHOS 2.9  --   --   --   --    Liver Function Tests: Recent Labs  Lab 09/05/2017 0954 08/30/17 0243 08/31/17 2201  AST 46* 44* 39  ALT 30 29 39  ALKPHOS 94 96 132*  BILITOT 0.9 0.6 0.7  PROT 5.5* 5.4* 6.8  ALBUMIN 2.4* 2.3* 2.8*   CBC: Recent Labs  Lab 08/20/2017 0954  08/31/17 0518 08/31/17 2201 09/01/17 0428 09/02/17 0517 09/02/17 2336  WBC 8.8   < > 16.5* 18.6* 15.6* 9.6 12.2*  NEUTROABS 7.5  --   --  16.4  --   --   --   HGB 12.5   < > 12.6 14.0 13.2 12.5 12.4  HCT 37.8   < > 38.4 42.2 39.5 38.4 37.9  MCV 89.6   < > 89.3 89.2 87.6 90.1 89.8  PLT 160   < > 198 242 197 165 177   < > = values in this interval not displayed.   Cardiac Enzymes: Recent Labs  Lab 09/02/2017 1530 08/17/2017 2108 08/30/17 0243 08/31/17 2201 09/01/17 0428  TROPONINI 0.23* 0.19* 0.15* 0.09* 0.07*   BNP (last 3 results) Recent Labs    08/31/2017 0954 08/31/17 2201  BNP 189.0* 626.0*    CBG: Recent Labs  Lab 08/31/17 0807 09/01/17 0449 09/03/17 0757 09/03/17 1159 09/04/17 0822  GLUCAP 229* 151* 128* 178* 137*    Recent Results (from the past 240 hour(s))  Culture, blood (Routine x 2)     Status: None   Collection Time: 09/05/2017  9:56 AM   Result Value Ref Range Status   Specimen Description BLOOD RIGHT HAND  Final   Special Requests   Final    BOTTLES DRAWN AEROBIC AND ANAEROBIC Blood Culture adequate volume   Culture   Final    NO GROWTH 5 DAYS Performed at [facility], [address] [place], [place] [address]    Report Status 09/03/2017 FINAL  Final  Culture, blood (Routine x 2)     Status: None   Collection Time: 08/17/2017  9:58 AM  Result Value Ref Range Status   Specimen Description RIGHT ANTECUBITAL  Final   Special Requests   Final    BOTTLES DRAWN AEROBIC AND ANAEROBIC Blood Culture adequate volume   Culture   Final    NO GROWTH 5 DAYS Performed at [facility], [address] [place], [place] [address]    Report Status 09/03/2017 FINAL  Final  Respiratory Panel by PCR  Status: None   Collection Time: September 20, 2017 12:31 PM  Result Value Ref Range Status   Adenovirus NOT DETECTED NOT DETECTED Final   Coronavirus 229E NOT DETECTED NOT DETECTED Final   Coronavirus HKU1 NOT DETECTED NOT DETECTED Final   Coronavirus NL63 NOT DETECTED NOT DETECTED Final   Coronavirus OC43 NOT DETECTED NOT DETECTED Final   Metapneumovirus NOT DETECTED NOT DETECTED Final   Rhinovirus / Enterovirus NOT DETECTED NOT DETECTED Final   Influenza A NOT DETECTED NOT DETECTED Final   Influenza B NOT DETECTED NOT DETECTED Final   Parainfluenza Virus 1 NOT DETECTED NOT DETECTED Final   Parainfluenza Virus 2 NOT DETECTED NOT DETECTED Final   Parainfluenza Virus 3 NOT DETECTED NOT DETECTED Final   Parainfluenza Virus 4 NOT DETECTED NOT DETECTED Final   Respiratory Syncytial Virus NOT DETECTED NOT DETECTED Final   Bordetella pertussis NOT DETECTED NOT DETECTED Final   Chlamydophila pneumoniae NOT DETECTED NOT DETECTED Final   Mycoplasma pneumoniae NOT DETECTED NOT DETECTED Final  Culture, sputum-assessment     Status: None   Collection Time: 09-20-17  3:14 PM  Result Value Ref Range Status   Specimen Description EXPECTORATED SPUTUM   Final   Special Requests NONE  Final   Sputum evaluation   Final    Sputum specimen not acceptable for testing.  Please recollect.   CALLED TO DANIEL RN AT 1019A ON [id number] BY THOMPSON S. Performed at [facility], [address], [place], [place] [address]    Report Status 08/31/2017 FINAL  Final  MRSA PCR Screening     Status: None   Collection Time: 08/30/17  7:00 PM  Result Value Ref Range Status   MRSA by PCR NEGATIVE NEGATIVE Final    Comment:        The GeneXpert MRSA Assay (FDA approved for NASAL specimens only), is one component of a comprehensive MRSA colonization surveillance program. It is not intended to diagnose MRSA infection nor to guide or monitor treatment for MRSA infections. Performed at [facility], [address] [place], [place] [address]   Culture, expectorated sputum-assessment     Status: None   Collection Time: 08/31/17  4:50 AM  Result Value Ref Range Status   Specimen Description EXPECTORATED SPUTUM  Final   Special Requests NONE  Final   Sputum evaluation   Final    THIS SPECIMEN IS ACCEPTABLE FOR SPUTUM CULTURE Performed at [facility] Performed at [facility], [address] [place], [place] [address]    Report Status 08/31/2017 FINAL  Final  Culture, respiratory (NON-Expectorated)     Status: None   Collection Time: 08/31/17  4:50 AM  Result Value Ref Range Status   Specimen Description   Final    EXPECTORATED SPUTUM Performed at [facility], [address], [place], [place] [address]    Special Requests   Final    NONE Reflexed from [id number] Performed at [facility], [address], [place], [place] [address]    Gram Stain   Final    MODERATE WBC PRESENT,BOTH PMN AND MONONUCLEAR FEW GRAM VARIABLE ROD RARE GRAM POSITIVE COCCI RARE YEAST ABUNDANT SQUAMOUS EPITHELIAL CELLS PRESENT    Culture   Final    Consistent with normal respiratory flora. Performed at [facility], [address], [place], [place] [address]     Report Status 09/03/2017 FINAL  Final  MRSA PCR Screening     Status: None   Collection Time: 09/01/17  5:19 PM  Result Value  Ref Range Status   MRSA by PCR NEGATIVE NEGATIVE Final    Comment:        The GeneXpert MRSA Assay (FDA approved for NASAL specimens only), is one component of a comprehensive MRSA colonization surveillance program. It is not intended to diagnose MRSA infection nor to guide or monitor treatment for MRSA infections. Performed at [facility], [address], [place], [place] [address]      Studies: Dg Chest [facility] 1 View  Result Date: 09/04/2017 CLINICAL DATA:  Shortness of breath EXAM: PORTABLE CHEST 1 VIEW COMPARISON:  August 31, 2017 FINDINGS: The cardiomediastinal silhouette is stable. Diffuse bilateral pulmonary opacities are unchanged. IMPRESSION: No interval change in diffuse bilateral pulmonary opacities. Electronically Signed   By: Gerome Sam III M.D   On: 09/04/2017 09:08    Scheduled Meds: . amiodarone  200 mg Oral Daily  . bisacodyl  10 mg Rectal Daily  . budesonide (PULMICORT) nebulizer solution  0.5 mg Nebulization BID  . enoxaparin (LOVENOX) injection  100 mg Subcutaneous Q12H  . feeding supplement (ENSURE ENLIVE)  237 mL Oral BID BM  . furosemide  20 mg Intravenous Q12H  . ipratropium-albuterol  3 mL Nebulization TID  . methylPREDNISolone (SOLU-MEDROL) injection  40 mg Intravenous Q8H  . metoprolol tartrate  12.5 mg Oral BID  . pantoprazole  40 mg Oral Daily  . rOPINIRole  0.5 mg Oral QHS   Continuous Infusions: . meropenem (MERREM) IV Stopped (09/04/17 6045)    Time spent: 30 minutes   Vassie Loll  [facility] Pager [phone]. If 7PM-7AM, please contact night-coverage at [email or web address], password [facility] 09/04/2017, 10:59 AM  LOS: 6 days

## 2017-09-04 NOTE — Progress Notes (Signed)
Patient dropped her saturation while sleeping , placed on her BiPAP and 10 Lpm -- patient did not want mask later. Place on NRB mask 15 lpm.

## 2017-09-05 LAB — GLUCOSE, CAPILLARY: Glucose-Capillary: 142 mg/dL — ABNORMAL HIGH (ref 65–99)

## 2017-09-05 MED ORDER — POTASSIUM CHLORIDE 10 MEQ/100ML IV SOLN
10.0000 meq | INTRAVENOUS | Status: AC
  Administered 2017-09-05 (×3): 10 meq via INTRAVENOUS
  Filled 2017-09-05 (×2): qty 100

## 2017-09-05 MED ORDER — FUROSEMIDE 40 MG PO TABS
40.0000 mg | ORAL_TABLET | Freq: Every day | ORAL | Status: DC
  Administered 2017-09-05 – 2017-09-11 (×7): 40 mg via ORAL
  Filled 2017-09-05 (×7): qty 1

## 2017-09-05 NOTE — Progress Notes (Signed)
[facility] PROGRESS NOTE  Sherwood GamblerDorothy W Bubolz AOZ:[id number] DOB: 01/05/1928 DOA: 08/20/2017 PCP: Lenell AntuLe, Thao P, DO  Interim summary and HPI 84 year old female with history significant for CKD stage III, GERD, hypertension, paroxysmal atrial fibrillation on Xarelto, and peripheral vascular disease who presented to the emergency with worsening cough and shortness of breath over the last 2-3 weeks.  She has been diagnosed with sepsis secondary to multilobular pneumonia and was initially thought to have some pneumomediastinum, but this appears to have resolved on repeat CT chest with gastrografin on 2/18. Case complicated with acute on chronic CHF now. Started on BIPAP and treatment with IV lasix started. Patient transferred to stepdown.  Assessment/Plan: 1-acute hypoxemic resp failure: in the setting of multifocal PNA, RAD and now acute on chronic diastolic CHF -Complete abx's on 2/23 -no fever. -Continue duonebs, continue Pulmicort, and steroids for acute RAD and potential underlying non-diagnosed COPD. -Repeat chest x-ray demonstrating diffuse bilateral pulmonary opacities with some improvement in aeration. -continue Daily weights, strict intake and output and diuretics; now transition to PO -appreciate Palliative care consult; will follow on further rec's for advance planning and GOC clarification.  Patient now full DNR, with instructions to not pursuit the use of BiPAP if needed. -After discussing with family members will treat what is treatable and if possible avoid heroic interventions.   -continue flutter valve, increase activity and continue weaning oxygen as tolerated -tolerating diet and PO meds.   2-Atrial fibrillation -Continue Lopressor and pacerone for rate control -CHADsVASC score 4 -currently using lovenox; if meds and diet well tolerated, will resume xarelto on 2/24  3-acute on chronic diastolic HF -will transition lasix to PO -Follow daily weights, strict intake and output   -Patient reporting good urine output and currently without any signs of edema of the legs. -Echo demonstrated preserved ejection fraction and grade 1 diastolic dysfunction.  4-elevated troponin -in the setting of demand ischemia  -echo reassuring with preserved ejection fraction and w/o wall motion abnormalities  -No acute ischemic changes appreciated on telemetry or EKG. -Patient remains chest pain-free.  5-hypokalemia -Continue further repletion as needed   6-GERD -continue PPI  7-CKD stage 3 -Stable and at baseline -Follow creatinine trend intermittently  -Patient reporting good urine output.  Code Status: DNR (and not looking to further use BIPAP if needed) Family Communication: daughter at bedside  Disposition Plan: remains inpatient, completed IV antibiotics on 2/23, transition lasix to PO, continue weaning oxygen as tolerated and increase Physical activity.   Consultants:  Palliative Care  Procedures:  2-D echo -preserved EF -grade 1 diastolic HF -no wall motion abnormalities   Antibiotics:  Ceftriaxone and Zithromax 2/17>>2/17  Meropenem 2/17>>  HPI/Subjective: Afebrile, no CP, breathing better. No nausea, no vomiting.   Objective: Vitals:   09/05/17 1424 09/05/17 1430  BP: (!) 148/80   Pulse: 70   Resp: 20   Temp: 98 F (36.7 C)   SpO2: 97% 94%    Intake/Output Summary (Last 24 hours) at 09/05/2017 1652 Last data filed at 09/05/2017 1300 Gross per 24 hour  Intake 320 ml  Output 1600 ml  Net -1280 ml   Filed Weights   09/03/17 0500 09/04/17 0400 09/05/17 0615  Weight: 97.3 kg (214 lb 8.1 oz) 95.3 kg (210 lb 1.6 oz) 94.2 kg (207 lb 10.8 oz)    Exam:   General: no fever, no CP, no nausea, no vomiting. Feeling better and breathing more comfortable. Still requiring high flow oxygen supplementation. Tolerating diet.    Cardiovascular: S1  and S2, no rubs, no gallops, positive SEM.  Respiratory: positive rhonchi, no wheezing, decreased BS at  baseline, no frank crackles.  Abdomen: soft, NT, ND, positive BS.    Musculoskeletal: trace edema bilaterally, TED hoses in place, no cyanosis, no clubbing   Data Reviewed: Basic Metabolic Panel: Recent Labs  Lab 08/31/17 2201 09/01/17 0428 09/02/17 0517 09/03/17 0435 09/04/17 0448  NA 138 139 141 141 140  K 3.6 3.3* 3.9 3.8 3.3*  CL 103 102 103 102 98*  CO2 21* 24 28 29 31   GLUCOSE 166* 167* 142* 152* 125*  BUN 42* 42* 42* 47* 45*  CREATININE 0.92 1.04* 0.86 0.85 0.90  CALCIUM 9.1 8.8* 8.7* 8.6* 8.5*  MG 2.0  --   --   --   --   PHOS 2.9  --   --   --   --    Liver Function Tests: Recent Labs  Lab 08/30/17 0243 08/31/17 2201  AST 44* 39  ALT 29 39  ALKPHOS 96 132*  BILITOT 0.6 0.7  PROT 5.4* 6.8  ALBUMIN 2.3* 2.8*   CBC: Recent Labs  Lab 08/31/17 0518 08/31/17 2201 09/01/17 0428 09/02/17 0517 09/02/17 2336  WBC 16.5* 18.6* 15.6* 9.6 12.2*  NEUTROABS  --  16.4  --   --   --   HGB 12.6 14.0 13.2 12.5 12.4  HCT 38.4 42.2 39.5 38.4 37.9  MCV 89.3 89.2 87.6 90.1 89.8  PLT 198 242 197 165 177   Cardiac Enzymes: Recent Labs  Lab 2017/09/28 2108 08/30/17 0243 08/31/17 2201 09/01/17 0428  TROPONINI 0.19* 0.15* 0.09* 0.07*   BNP (last 3 results) Recent Labs    28-Sep-2017 0954 08/31/17 2201  BNP 189.0* 626.0*    CBG: Recent Labs  Lab 09/01/17 0449 09/03/17 0757 09/03/17 1159 09/04/17 0822 09/05/17 0739  GLUCAP 151* 128* 178* 137* 142*    Recent Results (from the past 240 hour(s))  Culture, blood (Routine x 2)     Status: None   Collection Time: Sep 28, 2017  9:56 AM  Result Value Ref Range Status   Specimen Description BLOOD RIGHT HAND  Final   Special Requests   Final    BOTTLES DRAWN AEROBIC AND ANAEROBIC Blood Culture adequate volume   Culture   Final    NO GROWTH 5 DAYS Performed at [facility], [address], [place], [place] [address]    Report Status 09/03/2017 FINAL  Final  Culture, blood (Routine x 2)     Status: None   Collection  Time: 2017-09-28  9:58 AM  Result Value Ref Range Status   Specimen Description RIGHT ANTECUBITAL  Final   Special Requests   Final    BOTTLES DRAWN AEROBIC AND ANAEROBIC Blood Culture adequate volume   Culture   Final    NO GROWTH 5 DAYS Performed at [facility], [address], [place], [place] [address]    Report Status 09/03/2017 FINAL  Final  Respiratory Panel by PCR     Status: None   Collection Time: 09/28/2017 12:31 PM  Result Value Ref Range Status   Adenovirus NOT DETECTED NOT DETECTED Final   Coronavirus 229E NOT DETECTED NOT DETECTED Final   Coronavirus HKU1 NOT DETECTED NOT DETECTED Final   Coronavirus NL63 NOT DETECTED NOT DETECTED Final   Coronavirus OC43 NOT DETECTED NOT DETECTED Final   Metapneumovirus NOT DETECTED NOT DETECTED Final   Rhinovirus / Enterovirus NOT DETECTED NOT DETECTED Final   Influenza A NOT DETECTED NOT DETECTED Final  Influenza B NOT DETECTED NOT DETECTED Final   Parainfluenza Virus 1 NOT DETECTED NOT DETECTED Final   Parainfluenza Virus 2 NOT DETECTED NOT DETECTED Final   Parainfluenza Virus 3 NOT DETECTED NOT DETECTED Final   Parainfluenza Virus 4 NOT DETECTED NOT DETECTED Final   Respiratory Syncytial Virus NOT DETECTED NOT DETECTED Final   Bordetella pertussis NOT DETECTED NOT DETECTED Final   Chlamydophila pneumoniae NOT DETECTED NOT DETECTED Final   Mycoplasma pneumoniae NOT DETECTED NOT DETECTED Final  Culture, sputum-assessment     Status: None   Collection Time: 2017-09-17  3:14 PM  Result Value Ref Range Status   Specimen Description EXPECTORATED SPUTUM  Final   Special Requests NONE  Final   Sputum evaluation   Final    Sputum specimen not acceptable for testing.  Please recollect.   CALLED TO DANIEL RN AT 1019A ON [id number] BY THOMPSON S. Performed at [facility], [address] [place], [place] [address]    Report Status 08/31/2017 FINAL  Final  MRSA PCR Screening     Status: None   Collection Time: 08/30/17  7:00 PM  Result Value  Ref Range Status   MRSA by PCR NEGATIVE NEGATIVE Final    Comment:        The GeneXpert MRSA Assay (FDA approved for NASAL specimens only), is one component of a comprehensive MRSA colonization surveillance program. It is not intended to diagnose MRSA infection nor to guide or monitor treatment for MRSA infections. Performed at [facility], [address], [place], [place] [address]   Culture, expectorated sputum-assessment     Status: None   Collection Time: 08/31/17  4:50 AM  Result Value Ref Range Status   Specimen Description EXPECTORATED SPUTUM  Final   Special Requests NONE  Final   Sputum evaluation   Final    THIS SPECIMEN IS ACCEPTABLE FOR SPUTUM CULTURE Performed at [facility] Performed at [facility], [address], [place], [place] [address]    Report Status 08/31/2017 FINAL  Final  Culture, respiratory (NON-Expectorated)     Status: None   Collection Time: 08/31/17  4:50 AM  Result Value Ref Range Status   Specimen Description   Final    EXPECTORATED SPUTUM Performed at [facility], [address] [place], [place] [address]    Special Requests   Final    NONE Reflexed from [id number] Performed at [facility], [address] [place], [place] [address]    Gram Stain   Final    MODERATE WBC PRESENT,BOTH PMN AND MONONUCLEAR FEW GRAM VARIABLE ROD RARE GRAM POSITIVE COCCI RARE YEAST ABUNDANT SQUAMOUS EPITHELIAL CELLS PRESENT    Culture   Final    Consistent with normal respiratory flora. Performed at [facility], [address] [place], [place] [address]    Report Status 09/03/2017 FINAL  Final  MRSA PCR Screening     Status: None   Collection Time: 09/01/17  5:19 PM  Result Value Ref Range Status   MRSA by PCR NEGATIVE NEGATIVE Final    Comment:        The GeneXpert MRSA Assay (FDA approved for NASAL specimens only), is one component of a comprehensive MRSA colonization surveillance program. It is not intended to diagnose  MRSA infection nor to guide or monitor treatment for MRSA infections. Performed at [facility], [address], [place], [place] [address]      Studies: Dg Chest [facility] 1 View  Result Date: 09/04/2017 CLINICAL DATA:  Shortness of breath EXAM: PORTABLE CHEST 1 VIEW COMPARISON:  August 31, 2017 FINDINGS: The cardiomediastinal silhouette is stable. Diffuse bilateral pulmonary opacities are unchanged. IMPRESSION: No interval change in diffuse bilateral pulmonary opacities. Electronically Signed   By: Gerome Sam III M.D   On: 09/04/2017 09:08    Scheduled Meds: . amiodarone  200 mg Oral Daily  . bisacodyl  10 mg Rectal Daily  . budesonide (PULMICORT) nebulizer solution  0.5 mg Nebulization BID  . enoxaparin (LOVENOX) injection  100 mg Subcutaneous Q12H  . feeding supplement (ENSURE ENLIVE)  237 mL Oral BID BM  . fluticasone  1 spray Each Nare Daily  . furosemide  40 mg Oral Daily  . ipratropium-albuterol  3 mL Nebulization TID  . methylPREDNISolone (SOLU-MEDROL) injection  40 mg Intravenous Q8H  . metoprolol tartrate  12.5 mg Oral BID  . pantoprazole  40 mg Oral Daily  . rOPINIRole  0.5 mg Oral QHS   Continuous Infusions: . meropenem (MERREM) IV 1 g (09/05/17 1636)    Time spent: 30 minutes   Vassie Loll  [facility] Pager [phone]. If 7PM-7AM, please contact night-coverage at [email or web address], password [facility] 09/05/2017, 4:52 PM  LOS: 7 days

## 2017-09-06 LAB — CBC
HCT: 41.9 % (ref 36.0–46.0)
HEMOGLOBIN: 13.4 g/dL (ref 12.0–15.0)
MCH: 29.1 pg (ref 26.0–34.0)
MCHC: 32 g/dL (ref 30.0–36.0)
MCV: 91.1 fL (ref 78.0–100.0)
PLATELETS: 193 10*3/uL (ref 150–400)
RBC: 4.6 MIL/uL (ref 3.87–5.11)
RDW: 14.6 % (ref 11.5–15.5)
WBC: 12.5 10*3/uL — AB (ref 4.0–10.5)

## 2017-09-06 LAB — BASIC METABOLIC PANEL
Anion gap: 10 (ref 5–15)
BUN: 44 mg/dL — AB (ref 6–20)
CO2: 32 mmol/L (ref 22–32)
Calcium: 8.2 mg/dL — ABNORMAL LOW (ref 8.9–10.3)
Chloride: 97 mmol/L — ABNORMAL LOW (ref 101–111)
Creatinine, Ser: 0.78 mg/dL (ref 0.44–1.00)
Glucose, Bld: 147 mg/dL — ABNORMAL HIGH (ref 65–99)
POTASSIUM: 3.5 mmol/L (ref 3.5–5.1)
SODIUM: 139 mmol/L (ref 135–145)

## 2017-09-06 LAB — GLUCOSE, CAPILLARY: GLUCOSE-CAPILLARY: 144 mg/dL — AB (ref 65–99)

## 2017-09-06 MED ORDER — NYSTATIN 100000 UNIT/ML MT SUSP
5.0000 mL | Freq: Four times a day (QID) | OROMUCOSAL | Status: DC
  Administered 2017-09-06 – 2017-09-08 (×8): 500000 [IU] via ORAL
  Filled 2017-09-06 (×8): qty 5

## 2017-09-06 MED ORDER — FLUCONAZOLE 100 MG PO TABS
100.0000 mg | ORAL_TABLET | Freq: Every day | ORAL | Status: AC
  Administered 2017-09-06 – 2017-09-08 (×3): 100 mg via ORAL
  Filled 2017-09-06 (×3): qty 1

## 2017-09-06 NOTE — Evaluation (Signed)
Physical Therapy Evaluation Patient Details Name: [name] MRN: [id number] DOB: 09/09/1927 Today's Date: 09/06/2017   History of Present Illness  [name] is an 84yo white [facility] wafter 2-3w SOB , coughing, after poor response to outpatient OBX: pt admitted for hypoxemic ARF, complicated by multifocal PNA and acute on chronic CHF. PMH: PAF, depression, TKA, CKD, HTN, GERD, and 3MA ACDF + fusion C3/4, C5/6. C6/7. Pt reports chronic RLE weakness as well related to lumbar spine, s/p Rt foot drop with AFO use x3 months.   Clinical Impression  Pt admitted with above diagnosis. Pt currently with functional limitations due to the deficits listed below (see "PT Problem List"). Upon entry, the patient is received semirecumbent in bed, no family/caregiver present. The pt is awake and agreeable to participate. Pt received on 10LPM O2 HFNC c SpO2: 86%, trialed on 12LPM HFNC c SpO2: 93%.  Manual muscle testing screening reveals significant strength impairment in BUE compared to baseline, acute on chronic weakness in RLE, and moderate acute weakness in LLE (see full detail below). Functional mobility assessment deferred at this time d/t difficulty with saturation on 12L/min HFNC. Pt is pleasant, high motivated to participate and improve her functional mobility, as well as regain strength. Pt will benefit from skilled PT intervention to increase independence and safety with basic mobility in preparation for discharge to the venue listed below.       Follow Up Recommendations SNF;Supervision for mobility/OOB;Supervision/Assistance - 24 hour    Equipment Recommendations  None recommended by PT    Recommendations for Other Services       Precautions / Restrictions Precautions Precautions: Fall Restrictions Weight Bearing Restrictions: No      Mobility  Bed Mobility Overal bed mobility: Needs Assistance             General bed mobility comments: Sliding up in bed  with assistance, max-total Assistance + 2.   Transfers Overall transfer level: (dferred d/t high o2 need, pt on HFNC 12L/min)                  Ambulation/Gait                Stairs            Wheelchair Mobility    Modified Rankin (Stroke Patients Only)       Balance                                             Pertinent Vitals/Pain Pain Assessment: 0-10 Pain Score: 10-Worst pain ever Pain Location: oral burning pain, today's SLP note suggestve of possible cadidiasis.  Pain Descriptors / Indicators: Burning Pain Intervention(s): Limited activity within patient's tolerance;Monitored during session    Home Living Family/patient expects to be discharged to:: Private residence Living Arrangements: Alone Available Help at Discharge: Family;Available 24 hours/day Type of Home: House Home Access: Ramped entrance     Home Layout: Able to live on main level with bedroom/bathroom;Laundry or work area in basement;Two level Home Equipment: Walker - 2 wheels;Walker - 4 wheels;Cane - single point;Toilet riser      Prior Function Level of Independence: Independent with assistive device(s)         Comments: Pt uses cane and walker intermittently ; AMB community distances with A/E, fully independent in ADL/IADL/driving     Hand Dominance  Dominant Hand: Right    Extremity/Trunk Assessment   Upper Extremity Assessment Upper Extremity Assessment: Generalized weakness(BUE elbows grossly 4-/5, grips moderately impaired. )    Lower Extremity Assessment Lower Extremity Assessment: Generalized weakness;LLE deficits/detail;RLE deficits/detail RLE Deficits / Details: Chronic neurological deficits, uses an AFO x64months. foot drop with 2-/5 dorsipflexion, 4/5 plantar flexion, 4-/5 knee extension, 4/5 hip extension.  RLE Sensation: WNL LLE Deficits / Details: Ankle DF: 4+/5, PF 5/5; knee extension 4+/5, Hip extension 5/5.  LLE Sensation: WNL        Communication   Communication: No difficulties  Cognition Arousal/Alertness: Awake/alert Behavior During Therapy: WFL for tasks assessed/performed Overall Cognitive Status: Within Functional Limits for tasks assessed                                        General Comments      Exercises Other Exercises Other Exercises: inclined chest press AA/ROM: 1x12 bilat Other Exercises: Heel Slides: 1x15 bilat (modA required on Right)  Other Exercises: Minisquat, manually resisted in supine: 1x15 bilat   Assessment/Plan    PT Assessment Patient needs continued PT services  PT Problem List Decreased strength;Decreased activity tolerance;Decreased mobility       PT Treatment Interventions Gait training;Functional mobility training;Therapeutic activities;Therapeutic exercise;Patient/family education;Cognitive remediation    PT Goals (Current goals can be found in the Care Plan section)  Acute Rehab PT Goals Patient Stated Goal: regain independence with mobility ADL/ IADL PT Goal Formulation: With patient Time For Goal Achievement: 09/20/17 Potential to Achieve Goals: Good    Frequency Min 3X/week   Barriers to discharge Inaccessible home environment;Decreased caregiver support      Co-evaluation               AM-PAC PT "6 Clicks" Daily Activity  Outcome Measure Difficulty turning over in bed (including adjusting bedclothes, sheets and blankets)?: Unable Difficulty moving from lying on back to sitting on the side of the bed? : Unable Difficulty sitting down on and standing up from a chair with arms (e.g., wheelchair, bedside commode, etc,.)?: Unable Help needed moving to and from a bed to chair (including a wheelchair)?: Total Help needed walking in hospital room?: Total Help needed climbing 3-5 steps with a railing? : Total 6 Click Score: 6    End of Session Equipment Utilized During Treatment: Oxygen Activity Tolerance: Patient tolerated treatment  well;Treatment limited secondary to medical complications (Comment)(a few pauses for desaturation to 87%. ) Patient left: in bed;with bed alarm set;with call bell/phone within reach(chair-position, heels floating) Nurse Communication: Mobility status(O2 cylinder found) PT Visit Diagnosis: Muscle weakness (generalized) (M62.81);Difficulty in walking, not elsewhere classified (R26.2)    Time: 1610-9604 PT Time Calculation (min) (ACUTE ONLY): 30 min   Charges:   PT Evaluation $PT Eval High Complexity: 1 High PT Treatments $Therapeutic Activity: 8-22 mins   PT G Codes:        5:00 PM, Sep 18, 2017 Rosamaria Lints, PT, DPT Physical Therapist - [place] [phone] (Office)   Buccola,Allan C 09/18/2017, 4:57 PM

## 2017-09-06 NOTE — Progress Notes (Signed)
  Speech Language Pathology Treatment: Dysphagia  Patient Details Name: [name] MRN: [id number] DOB: 08/25/1927 Today's Date: 09/06/2017 Time: 1250-1305 SLP Time Calculation (min) (ACUTE ONLY): 15 min  Assessment / Plan / Recommendation Clinical Impression  Pt seen at bedside for ongoing diagnostic dysphagia intervention. Pt consumed only a small amount of her regular lunch tray and stated that she couldn't eat any of it due to "burning" in her mouth. She stated that she was hungry, but couldn't eat it. Upon oral inspection, Pt with white cottage-cheese appearing material and erythema along soft palate and oropharynx suspicious for candidiasis. Pt without overt signs of reduced airway protection, however she does have a previous history of dysphagia, breathy/hoarse vocal quality, and compromised respiratory status which increases her risk for aspiration. Recommend downgrading textures to D3/mech soft for ease of mastication, energy conservation, and oral transit in setting of odynophagia. We can complete MBSS if respiratory compromise is due to suspected aspiration, however when this was presented to Pt, she stated that her "swallowing was fine" but did endorse "burning" in oral cavity. SLP spoke with MD about possible oral thrush.   HPI HPI: 84 y.o. female  with past medical history significant for CKD stage III, GERD, hypertension, paroxysmal atrial fibrillation on Xarelto, and peripheral vascular disease who presented to the emergency with worsening cough and shortness of breath over the last 2-3 weeks.  She has been diagnosed with sepsis secondary to multilobular pneumonia and was initially thought to have some pneumomediastinum, but this appears to have resolved on repeat CT chest with gastrografin on 2/18. acute on chronic CHF,on BIPAP, IV lasix started admitted on 09/04/2017 with community-acquired pneumonia.       SLP Plan  Continue with current plan of care       Recommendations   Diet recommendations: Dysphagia 3 (mechanical soft);Thin liquid Liquids provided via: Cup;Straw Medication Administration: Crushed with puree Supervision: Patient able to self feed Compensations: Small sips/bites Postural Changes and/or Swallow Maneuvers: Seated upright 90 degrees;Upright 30-60 min after meal                Oral Care Recommendations: Oral care BID;Staff/trained caregiver to provide oral care Follow up Recommendations: 24 hour supervision/assistance SLP Visit Diagnosis: Dysphagia, oral phase (R13.11) Plan: Continue with current plan of care       Thank you,  Havery MorosDabney Keari Miu, CCC-SLP [phone]                 Ozzy Bohlken 09/06/2017, 1:18 PM

## 2017-09-06 NOTE — Progress Notes (Addendum)
[facility] PROGRESS NOTE  [name] WUJ:[id number] DOB: 1928-03-15 DOA: 09/22/17 PCP: Lenell Antu, DO  Interim summary and HPI 84 year old female with history significant for CKD stage III, GERD, hypertension, paroxysmal atrial fibrillation on Xarelto, and peripheral vascular disease who presented to the emergency with worsening cough and shortness of breath over the last 2-3 weeks.  She has been diagnosed with sepsis secondary to multilobular pneumonia and was initially thought to have some pneumomediastinum, but this appears to have resolved on repeat CT chest with gastrografin on 2/18. Case complicated with acute on chronic CHF now. resp improved and stable. Transfer back to medical floor. No CP. Will need SNF at discharge.  Assessment/Plan: 1-acute hypoxemic resp failure: in the setting of multifocal PNA, RAD and now acute on chronic diastolic CHF -Complete abx's on 2/24. -no fever and otherwise stable. -Continue duonebs, continue Pulmicort, and steroids for acute RAD and potential underlying non-diagnosed COPD; last one started to be tapered now.  -Repeat chest x-ray demonstrating diffuse bilateral pulmonary opacities with some improvement in aeration. -continue Daily weights, strict intake and output and diuretics; now transition to PO -appreciate Palliative care consult; will follow on further rec's for advance planning and GOC clarification.  Patient now full DNR, with instructions to not pursuit the use of BiPAP if needed. -After discussing with family members will treat what is treatable and if possible avoid heroic interventions.   -continue flutter valve, increase activity and continue weaning oxygen as tolerated -tolerating diet and PO meds.   2-Atrial fibrillation -Continue Lopressor and pacerone for rate control -CHADsVASC score 4 -continue xarelto  3-acute on chronic diastolic HF -continue laxis PO for maintenance -Follow daily weights, strict intake and output   -Patient reporting good urine output and currently without any signs of edema of the legs. -Echo demonstrated preserved ejection fraction and grade 1 diastolic dysfunction.  4-elevated troponin -in the setting of demand ischemia  -echo reassuring with preserved ejection fraction and w/o wall motion abnormalities  -No acute ischemic changes appreciated on telemetry or EKG. -Patient remains chest pain-free.  5-hypokalemia -follow electrolyte trend and repeat as needed   6-GERD -Continue PPI  7-CKD stage 3 -Stable and at baseline -Follow creatinine trend intermittently  -Patient reporting good urine output.  8-physical deconditioning: -Physical therapy has recommended skilled nursing facility at discharge -Will initiate bed search  9-thrush -will treat with nystatin and diflucan  Code Status: DNR (and not looking to further use BIPAP if needed) Family Communication: daughter at bedside  Disposition Plan: Physical therapy has recommended skilled nursing facility at discharge. Continue PO Lasix, continue weaning oxygen as tolerated and increase Physical activity.   Consultants:  Palliative Care  Procedures:  2-D echo -preserved EF -grade 1 diastolic HF -no wall motion abnormalities   Antibiotics:  Ceftriaxone and Zithromax 2/17>>2/17  Meropenem 2/17>>  HPI/Subjective: No fever, no chest pain, reports breathing is a stable.  No nausea or vomiting.  Patient reported some mild discomfort while chewing and some burning sensation inside her mouth.  Objective: Vitals:   09/06/17 1604 09/06/17 1610  BP:    Pulse:  87  Resp:    Temp:    SpO2: (!) 86% 93%    Intake/Output Summary (Last 24 hours) at 09/06/2017 1823 Last data filed at 09/06/2017 1747 Gross per 24 hour  Intake 720 ml  Output 2153 ml  Net -1433 ml   Filed Weights   09/04/17 0400 09/05/17 0615 09/06/17 0620  Weight: 95.3 kg (210 lb 1.6 oz)  94.2 kg (207 lb 10.8 oz) 94.6 kg (208 lb 8.9 oz)     Exam:   General: No fever, no chest pain, no nausea or vomiting.  Patient reports some burning sensation inside her mouth and expressed mild discomfort while eating.  Patient reported feeling otherwise okay, and reported breathing been stable.  Positive extensive thrush appreciated inside her mouth.  Cardiovascular: S1 and S2, no rubs, no gallops, positive systolic ejection murmur on exam.  No JVD.   Respiratory: Fair air movement, positive rhonchi, no expiratory wheezing appreciated at this time, no using accessory muscles, no frank crackles.  Abdomen: Soft, nontender, nondistended, positive bowel sounds.  Musculoskeletal: Trace edema bilaterally, no cyanosis, no clubbing.  Data Reviewed: Basic Metabolic Panel: Recent Labs  Lab 08/31/17 2201 09/01/17 0428 09/02/17 0517 09/03/17 0435 09/04/17 0448 09/06/17 0547  NA 138 139 141 141 140 139  K 3.6 3.3* 3.9 3.8 3.3* 3.5  CL 103 102 103 102 98* 97*  CO2 21* 24 28 29 31  32  GLUCOSE 166* 167* 142* 152* 125* 147*  BUN 42* 42* 42* 47* 45* 44*  CREATININE 0.92 1.04* 0.86 0.85 0.90 0.78  CALCIUM 9.1 8.8* 8.7* 8.6* 8.5* 8.2*  MG 2.0  --   --   --   --   --   PHOS 2.9  --   --   --   --   --    Liver Function Tests: Recent Labs  Lab 08/31/17 2201  AST 39  ALT 39  ALKPHOS 132*  BILITOT 0.7  PROT 6.8  ALBUMIN 2.8*   CBC: Recent Labs  Lab 08/31/17 2201 09/01/17 0428 09/02/17 0517 09/02/17 2336 09/06/17 0547  WBC 18.6* 15.6* 9.6 12.2* 12.5*  NEUTROABS 16.4  --   --   --   --   HGB 14.0 13.2 12.5 12.4 13.4  HCT 42.2 39.5 38.4 37.9 41.9  MCV 89.2 87.6 90.1 89.8 91.1  PLT 242 197 165 177 193   Cardiac Enzymes: Recent Labs  Lab 08/31/17 2201 09/01/17 0428  TROPONINI 0.09* 0.07*   BNP (last 3 results) Recent Labs    08/18/2017 0954 08/31/17 2201  BNP 189.0* 626.0*    CBG: Recent Labs  Lab 09/03/17 0757 09/03/17 1159 09/04/17 0822 09/05/17 0739 09/06/17 0802  GLUCAP 128* 178* 137* 142* 144*     Recent Results (from the past 240 hour(s))  Culture, blood (Routine x 2)     Status: None   Collection Time: 08/19/2017  9:56 AM  Result Value Ref Range Status   Specimen Description BLOOD RIGHT HAND  Final   Special Requests   Final    BOTTLES DRAWN AEROBIC AND ANAEROBIC Blood Culture adequate volume   Culture   Final    NO GROWTH 5 DAYS Performed at [facility], [address] [place], [place] [address]    Report Status 09/03/2017 FINAL  Final  Culture, blood (Routine x 2)     Status: None   Collection Time: 09/04/2017  9:58 AM  Result Value Ref Range Status   Specimen Description RIGHT ANTECUBITAL  Final   Special Requests   Final    BOTTLES DRAWN AEROBIC AND ANAEROBIC Blood Culture adequate volume   Culture   Final    NO GROWTH 5 DAYS Performed at [facility], [address] [place], [place] [address]    Report Status 09/03/2017 FINAL  Final  Respiratory Panel by PCR     Status: None   Collection Time: 08/23/2017 12:31 PM  Result Value Ref Range Status   Adenovirus NOT DETECTED NOT DETECTED Final   Coronavirus 229E NOT DETECTED NOT DETECTED Final   Coronavirus HKU1 NOT DETECTED NOT DETECTED Final   Coronavirus NL63 NOT DETECTED NOT DETECTED Final   Coronavirus OC43 NOT DETECTED NOT DETECTED Final   Metapneumovirus NOT DETECTED NOT DETECTED Final   Rhinovirus / Enterovirus NOT DETECTED NOT DETECTED Final   Influenza A NOT DETECTED NOT DETECTED Final   Influenza B NOT DETECTED NOT DETECTED Final   Parainfluenza Virus 1 NOT DETECTED NOT DETECTED Final   Parainfluenza Virus 2 NOT DETECTED NOT DETECTED Final   Parainfluenza Virus 3 NOT DETECTED NOT DETECTED Final   Parainfluenza Virus 4 NOT DETECTED NOT DETECTED Final   Respiratory Syncytial Virus NOT DETECTED NOT DETECTED Final   Bordetella pertussis NOT DETECTED NOT DETECTED Final   Chlamydophila pneumoniae NOT DETECTED NOT DETECTED Final   Mycoplasma pneumoniae NOT DETECTED NOT DETECTED Final  Culture, sputum-assessment      Status: None   Collection Time: 08/13/2017  3:14 PM  Result Value Ref Range Status   Specimen Description EXPECTORATED SPUTUM  Final   Special Requests NONE  Final   Sputum evaluation   Final    Sputum specimen not acceptable for testing.  Please recollect.   CALLED TO DANIEL RN AT [id number] ON [id number] BY THOMPSON S. Performed at [facility], [address] [place], [place] [address]    Report Status 08/31/2017 FINAL  Final  MRSA PCR Screening     Status: None   Collection Time: 08/30/17  7:00 PM  Result Value Ref Range Status   MRSA by PCR NEGATIVE NEGATIVE Final    Comment:        The GeneXpert MRSA Assay (FDA approved for NASAL specimens only), is one component of a comprehensive MRSA colonization surveillance program. It is not intended to diagnose MRSA infection nor to guide or monitor treatment for MRSA infections. Performed at [facility], [address] [place], [place] [address]   Culture, expectorated sputum-assessment     Status: None   Collection Time: 08/31/17  4:50 AM  Result Value Ref Range Status   Specimen Description EXPECTORATED SPUTUM  Final   Special Requests NONE  Final   Sputum evaluation   Final    THIS SPECIMEN IS ACCEPTABLE FOR SPUTUM CULTURE Performed at [facility] Performed at [facility], [address], [place], [place] [address]    Report Status 08/31/2017 FINAL  Final  Culture, respiratory (NON-Expectorated)     Status: None   Collection Time: 08/31/17  4:50 AM  Result Value Ref Range Status   Specimen Description   Final    EXPECTORATED SPUTUM Performed at [facility], [address], [place], [place] [address]    Special Requests   Final    NONE Reflexed from [id number] Performed at [facility], [address] [place], [place] [address]    Gram Stain   Final    MODERATE WBC PRESENT,BOTH PMN AND MONONUCLEAR FEW GRAM VARIABLE ROD RARE GRAM POSITIVE COCCI RARE YEAST ABUNDANT SQUAMOUS EPITHELIAL CELLS PRESENT    Culture    Final    Consistent with normal respiratory flora. Performed at [facility], [address], [place], [place] [address]    Report Status 09/03/2017 FINAL  Final  MRSA PCR Screening     Status: None   Collection Time: 09/01/17  5:19 PM  Result Value Ref Range Status   MRSA by PCR NEGATIVE NEGATIVE  Final    Comment:        The GeneXpert MRSA Assay (FDA approved for NASAL specimens only), is one component of a comprehensive MRSA colonization surveillance program. It is not intended to diagnose MRSA infection nor to guide or monitor treatment for MRSA infections. Performed at [facility], [address], [place], [place] [address]      Studies: No results found.  Scheduled Meds: . amiodarone  200 mg Oral Daily  . bisacodyl  10 mg Rectal Daily  . budesonide (PULMICORT) nebulizer solution  0.5 mg Nebulization BID  . enoxaparin (LOVENOX) injection  100 mg Subcutaneous Q12H  . feeding supplement (ENSURE ENLIVE)  237 mL Oral BID BM  . fluconazole  100 mg Oral Daily  . fluticasone  1 spray Each Nare Daily  . furosemide  40 mg Oral Daily  . ipratropium-albuterol  3 mL Nebulization TID  . methylPREDNISolone (SOLU-MEDROL) injection  40 mg Intravenous Q8H  . metoprolol tartrate  12.5 mg Oral BID  . nystatin  5 mL Oral QID  . pantoprazole  40 mg Oral Daily  . rOPINIRole  0.5 mg Oral QHS   Continuous Infusions:   Time spent: 30 minutes   Vassie Loll  [facility] Pager [phone]. If 7PM-7AM, please contact night-coverage at [email or web address], password [facility] 09/06/2017, 6:23 PM  LOS: 8 days

## 2017-09-06 NOTE — Progress Notes (Signed)
Daily Progress Note   Patient Name: [name]       Date: 09/06/2017 DOB: 05-Jul-1928  Age: 84 y.o. MRN#: [id number] Attending Physician: Vassie Loll, MD Primary Care Physician: Lenell Antu, DO Admit Date: 08/13/2017  Reason for Consultation/Follow-up: Establishing goals of care and Psychosocial/spiritual support  Subjective: [name] is resting quietly in bed.  She greets me making and keeping eye contact.  There is no family at bedside at this time.  Her lunch tray is in front of her, but she is eaten very little.  She tells me that her mouth is sore.  She shares that the hospitalist has ordered medication for her.  I verify this.  We talked about rehab.  [name] states that she is willing to go to rehab if this is what is best for her.  She shares that her daughters will help her decide, and also help choose a facility.  PT evaluation requested.  Conference with social worker related to disposition.  Length of Stay: 8  Current Medications: Scheduled Meds:  . amiodarone  200 mg Oral Daily  . bisacodyl  10 mg Rectal Daily  . budesonide (PULMICORT) nebulizer solution  0.5 mg Nebulization BID  . enoxaparin (LOVENOX) injection  100 mg Subcutaneous Q12H  . feeding supplement (ENSURE ENLIVE)  237 mL Oral BID BM  . fluconazole  100 mg Oral Daily  . fluticasone  1 spray Each Nare Daily  . furosemide  40 mg Oral Daily  . ipratropium-albuterol  3 mL Nebulization TID  . methylPREDNISolone (SOLU-MEDROL) injection  40 mg Intravenous Q8H  . metoprolol tartrate  12.5 mg Oral BID  . nystatin  5 mL Oral QID  . pantoprazole  40 mg Oral Daily  . rOPINIRole  0.5 mg Oral QHS    Continuous Infusions:   PRN Meds: acetaminophen **OR** acetaminophen, albuterol, ALPRAZolam, ondansetron  **OR** ondansetron (ZOFRAN) IV  Physical Exam  Constitutional: She is oriented to person, place, and time.  Appears weak and frail.  Makes and keeps eye contact.  HENT:  Head: Atraumatic.  Cardiovascular: Normal rate.  Pulmonary/Chest: Effort normal. No respiratory distress.  High flow nasal cannula, able to make a sentence  Abdominal: Soft. She exhibits no distension.  Neurological: She is alert and oriented to person, place, and time.  Skin: Skin  is warm and dry.  Psychiatric: Her mood appears not anxious. She is not agitated.  Nursing note and vitals reviewed.           Vital Signs: BP (!) 157/73 (BP Location: Right Arm)   Pulse 71   Temp (!) 97.5 F (36.4 C) (Oral)   Resp 19   Ht 5\' 4"  (1.626 m)   Wt 94.6 kg (208 lb 8.9 oz)   SpO2 95%   BMI 35.80 kg/m  SpO2: SpO2: 95 % O2 Device: O2 Device: High Flow Nasal Cannula O2 Flow Rate: O2 Flow Rate (L/min): 15 L/min  Intake/output summary:   Intake/Output Summary (Last 24 hours) at 09/06/2017 1301 Last data filed at 09/06/2017 1100 Gross per 24 hour  Intake 240 ml  Output 1700 ml  Net -1460 ml   LBM: Last BM Date: 09/05/17 Baseline Weight: Weight: 77.1 kg (170 lb) Most recent weight: Weight: 94.6 kg (208 lb 8.9 oz)       Palliative Assessment/Data:    Flowsheet Rows     Most Recent Value  Intake Tab  Referral Department  Hospitalist  Unit at Time of Referral  ICU  Palliative Care Primary Diagnosis  Pulmonary  Date Notified  09/01/17  Palliative Care Type  New Palliative care  Reason for referral  Clarify Goals of Care, Psychosocial or Spiritual support  Date of Admission  September 12, 2017  Date first seen by Palliative Care  09/02/17  # of days Palliative referral response time  1 Day(s)  # of days IP prior to Palliative referral  3  Clinical Assessment  Palliative Performance Scale Score  30%  Pain Max last 24 hours  Not able to report  Pain Min Last 24 hours  Not able to report  Dyspnea Max Last 24 Hours  Not  able to report  Dyspnea Min Last 24 hours  Not able to report  Psychosocial & Spiritual Assessment  Palliative Care Outcomes  Patient/Family meeting held?  Yes  Who was at the meeting?  Patient and daughters at bedside      Patient Active Problem List   Diagnosis Date Noted  . DNR (do not resuscitate) discussion   . Community acquired pneumonia   . Goals of care, counseling/discussion   . Palliative care by specialist   . Pneumonia 08/30/2017  . Acute hypoxemic respiratory failure (HCC)   . Multifocal pneumonia   . Elevated troponin level   . Sepsis (HCC)   . Hemorrhoids   . Constipation due to pain medication   . Chronic low back pain with sciatica   . Benign essential HTN   . Paroxysmal atrial fibrillation    . Chronic constipation   . Gastroesophageal reflux disease   . RLS (restless legs syndrome)   . DDD (degenerative disc disease), cervical   . Cervical stenosis of spine 05/24/2017  . Cervical myelopathy (HCC) 05/24/2017  . AF (paroxysmal atrial fibrillation) (HCC) 05/24/2017  . Chronic diastolic CHF (congestive heart failure) (HCC) 05/24/2017  . CKD (chronic kidney disease) stage 3, GFR 30-59 ml/min (HCC) 05/24/2017  . Essential hypertension 05/24/2017  . Right sided weakness 05/22/2017  . Neuropathic pain 05/26/2013  . Restless leg syndrome 05/26/2013  . OA (osteoarthritis) of knee 11/07/2012    Palliative Care Assessment & Plan   Patient Profile: 84 y.o.femalewith past medical historysignificant for CKD stage III, GERD, hypertension, paroxysmal atrial fibrillation on Xarelto, and peripheral vascular disease who presented to the emergency with worsening cough and shortness of breath over the  last 2-3 weeks. She has been diagnosed with sepsis secondary to multilobular pneumonia and was initially thought to have some pneumomediastinum, but this appears to have resolved on repeat CT chest with gastrografin on 2/18. acute on  chronic CHF,on BIPAP,IV lasix startedadmitted on 02/22/2019with community-acquired pneumonia.   Assessment: community-acquired pneumonia:  [name] seems to be improving physically with the benefits of IV fluids and antibiotics.  She wishes to continue as needed use of BiPAP  Recommendations/Plan:  Continue to treat the treatable.  No CPR, no intubation.  Okay for as needed BiPAP.  Goals of Care and Additional Recommendations:  Limitations on Scope of Treatment: treat the treatable, no CPR, no intubartion  Code Status:    Code Status Orders  (From admission, onward)        Start     Ordered   09/03/17 1528  Do not attempt resuscitation (DNR)  Continuous    Question Answer Comment  In the event of cardiac or respiratory ARREST Do not call a "code blue"   In the event of cardiac or respiratory ARREST Do not perform Intubation, CPR, defibrillation or ACLS   In the event of cardiac or respiratory ARREST Use medication by any route, position, wound care, and other measures to relive pain and suffering. May use oxygen, suction and manual treatment of airway obstruction as needed for comfort.   Comments OK for BiPAP, no intubation      09/03/17 1528    Code Status History    Date Active Date Inactive Code Status Order ID Comments User Context   08/24/2017 15:20 09/03/2017 15:28 Partial Code [id number]  Erick BlinksShah, Pratik D, DO ED   05/27/2017 09:18 06/07/2017 16:15 DNR [id number]  Jacquelynn CreeLove, Pamela S, PA-C Inpatient   05/26/2017 17:45 05/27/2017 09:17 DNR [id number]  Jacquelynn CreeLove, Pamela S, PA-C Inpatient   05/22/2017 02:46 05/26/2017 17:38 DNR [id number]  Carron CurieHijazi, Ali, MD Inpatient   05/26/2013 20:11 05/27/2013 22:49 Full Code [id number]  Drema DallasWoods, Curtis J, MD Inpatient   11/07/2012 13:15 11/09/2012 17:38 Full Code [id number]  Loanne DrillingAluisio, Frank V, MD Inpatient    Advance Directive Documentation     Most Recent Value  Type of Advance Directive  Healthcare Power of Attorney, Living will  Pre-existing out of  facility DNR order (yellow form or pink MOST form)  No data  "MOST" Form in Place?  No data       Prognosis:   < 6 months would not be surprising based on 3 hospitalizations in 6 months, frailty.  Discharge Planning:  To be determined, [name] is agreeable to rehab if needed  Care plan was discussed with nursing staff, social worker, and Dr. Gwenlyn PerkingMadera on next rounds.  Thank you for allowing the Palliative Medicine Team to assist in the care of this patient.   Time In:  1240 Time Out:  1305 Total Time  25 minutes Prolonged Time Billed  no       Greater than 50%  of this time was spent counseling and coordinating care related to the above assessment and plan.  Katheran Aweasha A Dove, NP  Please contact Palliative Medicine Team phone at [phone] for questions and concerns.

## 2017-09-07 LAB — GLUCOSE, CAPILLARY: GLUCOSE-CAPILLARY: 152 mg/dL — AB (ref 65–99)

## 2017-09-07 MED ORDER — PREDNISONE 20 MG PO TABS
50.0000 mg | ORAL_TABLET | Freq: Every day | ORAL | Status: DC
  Administered 2017-09-08: 09:00:00 50 mg via ORAL
  Filled 2017-09-07: qty 2

## 2017-09-07 MED ORDER — RIVAROXABAN 15 MG PO TABS
15.0000 mg | ORAL_TABLET | Freq: Every day | ORAL | Status: DC
  Administered 2017-09-07 – 2017-09-11 (×5): 15 mg via ORAL
  Filled 2017-09-07 (×5): qty 1

## 2017-09-07 NOTE — Progress Notes (Signed)
Barrier cream applied to PT groin and buttocks 3 times through the shift to ensure skin integrity and healing of MASD in areas. Please continue to apply ointment and monitor for further skin breakdown. Sacral foam placed to prevent pressure injury to bony prominence.

## 2017-09-07 NOTE — Progress Notes (Signed)
  Speech Language Pathology Treatment: Dysphagia  Patient Details Name: Lisa GamblerDorothy W Mccown MRN: [id number] DOB: 07/08/1928 Today's Date: 09/07/2017 Time: [id number] SLP Time Calculation (min) (ACUTE ONLY): 13 min  Assessment / Plan / Recommendation Clinical Impression  Pt reports only a slight improvement in odynophagia, however subjectively her oral cavity appears with much less white coating. Pt also with improved vocal quality and no overt signs or symptoms aspiration during self presentations of sherbet and water. Pt stated that she would try some soup for dinner, phone call placed to dietary who will add to her tray along with Magic Cup and vanilla pudding per RD recommendations. Pt did not want SLP to downgrade textures at this time. Anticipate that Pt will be able to take solids foods as her mouth starts to feel better. SLP will follow.    HPI HPI: 84 y.o. female  with past medical history significant for CKD stage III, GERD, hypertension, paroxysmal atrial fibrillation on Xarelto, and peripheral vascular disease who presented to the emergency with worsening cough and shortness of breath over the last 2-3 weeks.  She has been diagnosed with sepsis secondary to multilobular pneumonia and was initially thought to have some pneumomediastinum, but this appears to have resolved on repeat CT chest with gastrografin on 2/18. acute on chronic CHF,on BIPAP, IV lasix started admitted on 08/15/2017 with community-acquired pneumonia.       SLP Plan  Continue with current plan of care       Recommendations  Diet recommendations: Dysphagia 3 (mechanical soft);Thin liquid Liquids provided via: Cup;Straw Medication Administration: Crushed with puree Supervision: Patient able to self feed Compensations: Small sips/bites Postural Changes and/or Swallow Maneuvers: Seated upright 90 degrees;Upright 30-60 min after meal                Oral Care Recommendations: Oral care BID;Staff/trained caregiver to  provide oral care Follow up Recommendations: 24 hour supervision/assistance SLP Visit Diagnosis: Dysphagia, oral phase (R13.11) Plan: Continue with current plan of care       Thank you,  Havery MorosDabney Porter, CCC-SLP [phone]                 PORTER,DABNEY 09/07/2017, 4:32 PM

## 2017-09-07 NOTE — Care Management Note (Signed)
Case Management Note  Patient Details  Name: [name] MRN: [id number] Date of Birth: 03/09/1928   If discussed at Long Length of Stay Meetings, dates discussed:   09/07/2017  Additional Comments:  Arnice Vanepps, Chrystine OilerSharley Diane, RN 09/07/2017, 4:17 PM

## 2017-09-07 NOTE — Progress Notes (Signed)
[facility] PROGRESS NOTE  [name] QMV:[id number] DOB: 03/04/1928 DOA: 09-24-17 PCP: Lenell Antu, DO  Interim summary and HPI 84 year old female with history significant for CKD stage III, GERD, hypertension, paroxysmal atrial fibrillation on Xarelto, and peripheral vascular disease who presented to the emergency with worsening cough and shortness of breath over the last 2-3 weeks.  She has been diagnosed with sepsis secondary to multilobular pneumonia and was initially thought to have some pneumomediastinum, but this appears to have resolved on repeat CT chest with gastrografin on 2/18. Case complicated with acute on chronic CHF now. resp improved and stable. Transfer back to medical floor. No CP. Will need SNF at discharge.  Assessment/Plan: 1-acute hypoxemic resp failure: in the setting of multifocal PNA, RAD and now acute on chronic diastolic CHF -Completed abx's on 2/24. -no fever and otherwise stable. -Continue duonebs, continue Pulmicort, and steroids for acute RAD and potential underlying non-diagnosed COPD; last one started to be tapered now and transitioned to PO on 2/25.  -Repeat chest x-ray demonstrating diffuse bilateral pulmonary opacities with some improvement in aeration. -continue Daily weights, strict intake and output  -continue PO lasix.  -appreciate Palliative care consult; will follow on further rec's for advance planning and GOC clarification.  Patient now full DNR, with instructions to not pursuit the use of BiPAP if needed. -After discussing with family members will treat what is treatable and if possible avoid heroic interventions.   -continue flutter valve, increase activity and continue weaning oxygen as tolerated -tolerating diet and PO meds.   2-Atrial fibrillation -Continue Lopressor and pacerone for rate control -CHADsVASC score 4 -continue xarelto  3-acute on chronic diastolic HF -continue laxis PO for maintenance -Follow daily weights,  strict intake and output  -Patient reporting good urine output and currently without any signs of fluid overload on exam. -Echo demonstrated preserved ejection fraction and grade 1 diastolic dysfunction.  4-elevated troponin -in the setting of demand ischemia  -echo reassuring with preserved ejection fraction and w/o wall motion abnormalities  -No acute ischemic changes appreciated on telemetry or EKG. -Patient remains chest pain-free.  5-hypokalemia -repeat as needed   6-GERD -continue PPI  7-CKD stage 3 -Stable and at baseline -Follow creatinine trend intermittently  -Patient reporting good urine output.  8-physical deconditioning: -Physical therapy has recommended skilled nursing facility at discharge -bed search started -SW aware  9-thrush -continue treatment with nystatin and diflucan  Code Status: DNR (and not looking to further use BIPAP if needed) Family Communication: daughter at bedside  Disposition Plan: Physical therapy has recommended skilled nursing facility at discharge. Continue PO Lasix, continue weaning oxygen as tolerated and increase Physical activity. Transition steroids to PO prednisone and continue slow tapering.   Consultants:  Palliative Care  Procedures:  2-D echo -preserved EF -grade 1 diastolic HF -no wall motion abnormalities   Antibiotics:  Ceftriaxone and Zithromax 2/17>>2/17  Meropenem 2/17>>2/24  HPI/Subjective: Continue slowly to improved. No CP, no nausea, no vomting, no abd pain. Afebrile.   Objective: Vitals:   09/07/17 1126 09/07/17 1300  BP:    Pulse:    Resp:    Temp:    SpO2: 94% 94%    Intake/Output Summary (Last 24 hours) at 09/07/2017 2023 Last data filed at 09/07/2017 0606 Gross per 24 hour  Intake 240 ml  Output 250 ml  Net -10 ml   Filed Weights   09/05/17 0615 09/06/17 0620 09/07/17 0625  Weight: 94.2 kg (207 lb 10.8 oz) 94.6 kg (208 lb 8.9  oz) 93.4 kg (205 lb 14.6 oz)    Exam:   General:  afebrile, no CP and reports breathing continue slowly to improved. No nausea, no vomiting. Expressed her mouth is feeling better.No fever, no chest pain, no nausea or vomiting.  Using high flow oxygen [place] 10L  Cardiovascular: S1 and S2, positive SEM, no rubs, no gallops  Respiratory: overall with improved air movement, no crackles. Positive rhonchi, tiny exp wheezing. Normal resp effort at rest.   Abdomen: Soft, NT, ND, positive BS  Musculoskeletal: no cyanosis, no clubbing   Data Reviewed: Basic Metabolic Panel: Recent Labs  Lab 08/31/17 2201 09/01/17 0428 09/02/17 0517 09/03/17 0435 09/04/17 0448 09/06/17 0547  NA 138 139 141 141 140 139  K 3.6 3.3* 3.9 3.8 3.3* 3.5  CL 103 102 103 102 98* 97*  CO2 21* 24 28 29 31  32  GLUCOSE 166* 167* 142* 152* 125* 147*  BUN 42* 42* 42* 47* 45* 44*  CREATININE 0.92 1.04* 0.86 0.85 0.90 0.78  CALCIUM 9.1 8.8* 8.7* 8.6* 8.5* 8.2*  MG 2.0  --   --   --   --   --   PHOS 2.9  --   --   --   --   --    Liver Function Tests: Recent Labs  Lab 08/31/17 2201  AST 39  ALT 39  ALKPHOS 132*  BILITOT 0.7  PROT 6.8  ALBUMIN 2.8*   CBC: Recent Labs  Lab 08/31/17 2201 09/01/17 0428 09/02/17 0517 09/02/17 2336 09/06/17 0547  WBC 18.6* 15.6* 9.6 12.2* 12.5*  NEUTROABS 16.4  --   --   --   --   HGB 14.0 13.2 12.5 12.4 13.4  HCT 42.2 39.5 38.4 37.9 41.9  MCV 89.2 87.6 90.1 89.8 91.1  PLT 242 197 165 177 193   Cardiac Enzymes: Recent Labs  Lab 08/31/17 2201 09/01/17 0428  TROPONINI 0.09* 0.07*   BNP (last 3 results) Recent Labs    08/23/2017 0954 08/31/17 2201  BNP 189.0* 626.0*    CBG: Recent Labs  Lab 09/03/17 1159 09/04/17 0822 09/05/17 0739 09/06/17 0802 09/07/17 0757  GLUCAP 178* 137* 142* 144* 152*    Recent Results (from the past 240 hour(s))  Culture, blood (Routine x 2)     Status: None   Collection Time: 08/30/2017  9:56 AM  Result Value Ref Range Status   Specimen Description BLOOD RIGHT HAND  Final    Special Requests   Final    BOTTLES DRAWN AEROBIC AND ANAEROBIC Blood Culture adequate volume   Culture   Final    NO GROWTH 5 DAYS Performed at [facility], [address] [place], [place] [address]    Report Status 09/03/2017 FINAL  Final  Culture, blood (Routine x 2)     Status: None   Collection Time: 09/06/2017  9:58 AM  Result Value Ref Range Status   Specimen Description RIGHT ANTECUBITAL  Final   Special Requests   Final    BOTTLES DRAWN AEROBIC AND ANAEROBIC Blood Culture adequate volume   Culture   Final    NO GROWTH 5 DAYS Performed at [facility], [address] [place], [place] [address]    Report Status 09/03/2017 FINAL  Final  Respiratory Panel by PCR     Status: None   Collection Time: 08/15/2017 12:31 PM  Result Value Ref Range Status   Adenovirus NOT DETECTED NOT DETECTED Final   Coronavirus 229E NOT DETECTED NOT DETECTED Final  Coronavirus HKU1 NOT DETECTED NOT DETECTED Final   Coronavirus NL63 NOT DETECTED NOT DETECTED Final   Coronavirus OC43 NOT DETECTED NOT DETECTED Final   Metapneumovirus NOT DETECTED NOT DETECTED Final   Rhinovirus / Enterovirus NOT DETECTED NOT DETECTED Final   Influenza A NOT DETECTED NOT DETECTED Final   Influenza B NOT DETECTED NOT DETECTED Final   Parainfluenza Virus 1 NOT DETECTED NOT DETECTED Final   Parainfluenza Virus 2 NOT DETECTED NOT DETECTED Final   Parainfluenza Virus 3 NOT DETECTED NOT DETECTED Final   Parainfluenza Virus 4 NOT DETECTED NOT DETECTED Final   Respiratory Syncytial Virus NOT DETECTED NOT DETECTED Final   Bordetella pertussis NOT DETECTED NOT DETECTED Final   Chlamydophila pneumoniae NOT DETECTED NOT DETECTED Final   Mycoplasma pneumoniae NOT DETECTED NOT DETECTED Final  Culture, sputum-assessment     Status: None   Collection Time: September 03, 2017  3:14 PM  Result Value Ref Range Status   Specimen Description EXPECTORATED SPUTUM  Final   Special Requests NONE  Final   Sputum evaluation   Final    Sputum  specimen not acceptable for testing.  Please recollect.   CALLED TO DANIEL RN AT 1019A ON [id number] BY THOMPSON S. Performed at [facility], [address], [place], [place] [address]    Report Status 08/31/2017 FINAL  Final  MRSA PCR Screening     Status: None   Collection Time: 08/30/17  7:00 PM  Result Value Ref Range Status   MRSA by PCR NEGATIVE NEGATIVE Final    Comment:        The GeneXpert MRSA Assay (FDA approved for NASAL specimens only), is one component of a comprehensive MRSA colonization surveillance program. It is not intended to diagnose MRSA infection nor to guide or monitor treatment for MRSA infections. Performed at [facility], [address], [place], [place] [address]   Culture, expectorated sputum-assessment     Status: None   Collection Time: 08/31/17  4:50 AM  Result Value Ref Range Status   Specimen Description EXPECTORATED SPUTUM  Final   Special Requests NONE  Final   Sputum evaluation   Final    THIS SPECIMEN IS ACCEPTABLE FOR SPUTUM CULTURE Performed at [facility] Performed at [facility], [address], [place], [place] [address]    Report Status 08/31/2017 FINAL  Final  Culture, respiratory (NON-Expectorated)     Status: None   Collection Time: 08/31/17  4:50 AM  Result Value Ref Range Status   Specimen Description   Final    EXPECTORATED SPUTUM Performed at [facility], [address] [place], [place] [address]    Special Requests   Final    NONE Reflexed from [id number] Performed at [facility], [address] [place], [place] [address]    Gram Stain   Final    MODERATE WBC PRESENT,BOTH PMN AND MONONUCLEAR FEW GRAM VARIABLE ROD RARE GRAM POSITIVE COCCI RARE YEAST ABUNDANT SQUAMOUS EPITHELIAL CELLS PRESENT    Culture   Final    Consistent with normal respiratory flora. Performed at [facility], [address], [place], [place] [address]    Report Status 09/03/2017 FINAL  Final  MRSA PCR Screening     Status: None    Collection Time: 09/01/17  5:19 PM  Result Value Ref Range Status   MRSA by PCR NEGATIVE NEGATIVE Final    Comment:        The GeneXpert MRSA Assay (FDA approved for NASAL specimens only), is one  component of a comprehensive MRSA colonization surveillance program. It is not intended to diagnose MRSA infection nor to guide or monitor treatment for MRSA infections. Performed at [facility], [address], [place], [place] [address]      Studies: No results found.  Scheduled Meds: . amiodarone  200 mg Oral Daily  . bisacodyl  10 mg Rectal Daily  . budesonide (PULMICORT) nebulizer solution  0.5 mg Nebulization BID  . feeding supplement (ENSURE ENLIVE)  237 mL Oral BID BM  . fluconazole  100 mg Oral Daily  . fluticasone  1 spray Each Nare Daily  . furosemide  40 mg Oral Daily  . ipratropium-albuterol  3 mL Nebulization TID  . methylPREDNISolone (SOLU-MEDROL) injection  40 mg Intravenous Q8H  . metoprolol tartrate  12.5 mg Oral BID  . nystatin  5 mL Oral QID  . pantoprazole  40 mg Oral Daily  . rivaroxaban  15 mg Oral Q supper  . rOPINIRole  0.5 mg Oral QHS   Continuous Infusions:   Time spent: 25 minutes   Vassie Loll  [facility] Pager [phone]. If 7PM-7AM, please contact night-coverage at [email or web address], password [facility] 09/07/2017, 8:23 PM  LOS: 9 days

## 2017-09-07 NOTE — Progress Notes (Signed)
PT Cancellation Note  Patient Details Name: [name] MRN: [id number] DOB: 10/31/1927   Cancelled Treatment:    Reason Eval/Treat Not Completed: Fatigue/lethargy limiting ability to participate Attempted PT session, pt reports she feels too weak while trying to eat the meal on table beside bed.  Did assist pt with rolling over for bed pot to assist with bowel movement, encouraged to call nurse when done.    [address][name], LPTA; CBIS [phone] Juel BurrowCockerham, Geremiah Fussell Jo 09/07/2017, 2:36 PM

## 2017-09-08 ENCOUNTER — Inpatient Hospital Stay (HOSPITAL_COMMUNITY): Payer: Medicare Other

## 2017-09-08 DIAGNOSIS — I480 Paroxysmal atrial fibrillation: Secondary | ICD-10-CM

## 2017-09-08 DIAGNOSIS — J939 Pneumothorax, unspecified: Secondary | ICD-10-CM

## 2017-09-08 DIAGNOSIS — J9601 Acute respiratory failure with hypoxia: Secondary | ICD-10-CM

## 2017-09-08 DIAGNOSIS — R748 Abnormal levels of other serum enzymes: Secondary | ICD-10-CM

## 2017-09-08 DIAGNOSIS — I10 Essential (primary) hypertension: Secondary | ICD-10-CM

## 2017-09-08 DIAGNOSIS — T797XXA Traumatic subcutaneous emphysema, initial encounter: Secondary | ICD-10-CM

## 2017-09-08 DIAGNOSIS — J982 Interstitial emphysema: Secondary | ICD-10-CM

## 2017-09-08 DIAGNOSIS — J181 Lobar pneumonia, unspecified organism: Secondary | ICD-10-CM

## 2017-09-08 DIAGNOSIS — N183 Chronic kidney disease, stage 3 (moderate): Secondary | ICD-10-CM

## 2017-09-08 LAB — BASIC METABOLIC PANEL
Anion gap: 14 (ref 5–15)
BUN: 55 mg/dL — AB (ref 6–20)
CO2: 30 mmol/L (ref 22–32)
Calcium: 8.6 mg/dL — ABNORMAL LOW (ref 8.9–10.3)
Chloride: 94 mmol/L — ABNORMAL LOW (ref 101–111)
Creatinine, Ser: 0.97 mg/dL (ref 0.44–1.00)
GFR calc Af Amer: 59 mL/min — ABNORMAL LOW (ref >60)
GFR, EST NON AFRICAN AMERICAN: 51 mL/min — AB (ref >60)
GLUCOSE: 137 mg/dL — AB (ref 65–99)
POTASSIUM: 3.2 mmol/L — AB (ref 3.5–5.1)
Sodium: 138 mmol/L (ref 135–145)

## 2017-09-08 LAB — GLUCOSE, CAPILLARY: GLUCOSE-CAPILLARY: 140 mg/dL — AB (ref 65–99)

## 2017-09-08 LAB — CBC
HEMATOCRIT: 46.9 % — AB (ref 36.0–46.0)
Hemoglobin: 15.3 g/dL — ABNORMAL HIGH (ref 12.0–15.0)
MCH: 29.4 pg (ref 26.0–34.0)
MCHC: 32.6 g/dL (ref 30.0–36.0)
MCV: 90 fL (ref 78.0–100.0)
Platelets: 242 10*3/uL (ref 150–400)
RBC: 5.21 MIL/uL — ABNORMAL HIGH (ref 3.87–5.11)
RDW: 14.5 % (ref 11.5–15.5)
WBC: 17.5 10*3/uL — ABNORMAL HIGH (ref 4.0–10.5)

## 2017-09-08 LAB — PROCALCITONIN: Procalcitonin: <0.1 ng/mL

## 2017-09-08 MED ORDER — MAGIC MOUTHWASH W/LIDOCAINE: 5.0000 mL | Freq: Four times a day (QID) | ORAL | Status: DC

## 2017-09-08 MED ORDER — IOPAMIDOL (ISOVUE-300) INJECTION 61%
100.0000 mL | Freq: Once | INTRAVENOUS | Status: AC | PRN
  Administered 2017-09-08: 70 mL via INTRAVENOUS

## 2017-09-08 MED ORDER — MAGIC MOUTHWASH
2.5000 mL | Freq: Four times a day (QID) | ORAL | Status: DC
  Administered 2017-09-08 – 2017-09-11 (×14): 2.5 mL via ORAL
  Filled 2017-09-08 (×16): qty 5

## 2017-09-08 MED ORDER — LIDOCAINE VISCOUS 2 % MT SOLN
2.5000 mL | Freq: Four times a day (QID) | OROMUCOSAL | Status: DC
  Administered 2017-09-08 – 2017-09-11 (×14): 2.5 mL via OROMUCOSAL
  Filled 2017-09-08 (×16): qty 15

## 2017-09-08 MED ORDER — VALACYCLOVIR HCL 500 MG PO TABS
1000.0000 mg | ORAL_TABLET | Freq: Two times a day (BID) | ORAL | Status: AC
  Administered 2017-09-08 – 2017-09-09 (×2): 1000 mg via ORAL
  Filled 2017-09-08 (×2): qty 2

## 2017-09-08 MED ORDER — POTASSIUM CHLORIDE 10 MEQ/100ML IV SOLN
10.0000 meq | INTRAVENOUS | Status: AC
  Administered 2017-09-08 (×2): 10 meq via INTRAVENOUS
  Filled 2017-09-08 (×2): qty 100

## 2017-09-08 MED ORDER — METHYLPREDNISOLONE SODIUM SUCC 40 MG IJ SOLR
40.0000 mg | Freq: Two times a day (BID) | INTRAMUSCULAR | Status: DC
  Administered 2017-09-08 – 2017-09-11 (×6): 40 mg via INTRAVENOUS
  Filled 2017-09-08 (×6): qty 1

## 2017-09-08 NOTE — Progress Notes (Signed)
PT Cancellation Note  Patient Details Name: Lisa GamblerDorothy W Solomon MRN: [id number] DOB: 01/21/1928   Cancelled Treatment:    Reason Eval/Treat Not Completed: Fatigue/lethargy limiting ability to participate Attempted PT session, pt supine in bed and reports she feels very weak and c/o sore mouth and throat.  Following c/o of weakness O2 sat down to 83%.  10 min spent instructing diaphragmatic breathing techniques to assist with deep breathing.  O2 sat at 90% following education.  Pt left in bed supine with SLP in room, call bell within reach.  [address][name], LPTA; CBIS [phone]  [name], [name] 09/08/2017, 10:31 AM

## 2017-09-08 NOTE — Progress Notes (Signed)
After speaking with family members, patient decided that she would give consent for a chest tube if she needs one.

## 2017-09-08 NOTE — Progress Notes (Signed)
PROGRESS NOTE  [name]:[id number] DOB: [birth year] DOA: Sep 14, 2017 PCP: Lenell Antu, DO  Interim summary and HPI 84 year old female with history significant for CKD stage III, GERD, hypertension, paroxysmal atrial fibrillation on Xarelto, and peripheral vascular disease who presented to the emergency with worsening cough and shortness of breath over the last 2-3 weeks. She has been diagnosed with sepsis secondary to multilobular pneumonia and was initially thought to have some pneumomediastinum, but this appears to have resolved on repeat CT chest with gastrografin on 2/18. Case complicated with acute on chronic CHF now. resp improved and stable. Transfer back to medical floor. No CP. Will need SNF at discharge.  Assessment/Plan: acute hypoxemic resp failure: in the setting of multifocal PNA, RAD and now acute on chronic diastolic CHF  -Completed abx's on 2/25. -still requiring 6 L  -Continue duonebs, continue Pulmicort, and steroids for acute RAD and potential underlying non-diagnosed COPD  -Repeat chest x-ray -restart IV solumedrol 40 mg bid -continue Daily weights, strict intake and output  -continue PO lasix.  -appreciate Palliative care consult; will follow on further rec's for advance planning and GOC clarification.  Patient now full DNR, with instructions to not pursuit the use of BiPAP if needed. -After discussing with family members will treat what is treatable and if possible avoid heroic interventions.   -continue flutter valve, increase activity and continue weaning oxygen as tolerated -tolerating diet and PO meds.   Lobar pneumonia -finished 7 days merrem on 2/25  Atrial fibrillation -Continue Lopressor and pacerone for rate control -CHADsVASC score 4 -continue xarelto  acute on chronic diastolic HF -continue laxis PO for maintenance -clinically euvolemic on exam -Patient reporting good urine output and currently without any signs of fluid  overload on exam. -Echo demonstrated preserved ejection fraction and grade 1 diastolic dysfunction. -09/01/2017 echo EF 60-65%, grade 1 DD, mild TR, PASP 30  elevated troponin -in the setting of demand ischemia  -echo reassuring with preserved ejection fraction and w/o wall motion abnormalities  -No acute ischemic changes appreciated on telemetry or EKG. -Patient remains chest pain-free.  hypokalemia -repeat as needed   GERD -continue PPI  CKD stage 3 -Stable and at baseline -baseline creatinine 0.8-1.0 -Follow creatinine trend intermittently  -Patient reporting good urine output.  physical deconditioning: -Physical therapy has recommended skilled nursing facility at discharge -bed search started -SW aware  HSV gingivostomatitis -valtrex 1 gram bid x 2 doses  Dysphagia -Dys 2 diet with thin liquids  Code Status: DNR (and not looking to further use BIPAP if needed) Family Communication: daughter at bedside  Disposition Plan: Physical therapy has recommended skilled nursing facility at discharge. Continue PO Lasix, continue weaning oxygen as tolerated and increase Physical activity. Transition steroids to PO prednisone and continue slow tapering.   Consultants:  Palliative Care  Procedures:  2-D echo -preserved EF -grade 1 diastolic HF -no wall motion abnormalities   Antibiotics:  Ceftriaxone and Zithromax 2/17>>2/17  Meropenem 2/17>>2/25     Subjective: The patient is overall is breathing better but has dyspnea with minimal exertion.  She complains of sores and pain in her mouth limiting her oral intake.  She denies any headache, neck pain, chest pain, nausea, vomiting, diarrhea, abdominal pain.  No dysuria hematuria.  No rashes.  Objective: Vitals:   09/07/17 2120 09/07/17 2130 09/07/17 2300 09/08/17 0555  BP:  126/67  106/66  Pulse:  84  78  Resp:  20  20  Temp:  98.4 F (36.9 C)  98 F (36.7 C)  TempSrc:  Oral  Oral  SpO2: 93% 98% 92%  91%  Weight:    93.4 kg (205 lb 14.6 oz)  Height:        Intake/Output Summary (Last 24 hours) at 09/08/2017 1232 Last data filed at 09/08/2017 0559 Gross per 24 hour  Intake -  Output 250 ml  Net -250 ml   Weight change: 0 kg (0 lb) Exam:   General:  Pt is alert, follows commands appropriately, not in acute distress  HEENT: No icterus, No thrush, No neck mass, [place]/AT  Cardiovascular: RRR, S1/S2, no rubs, no gallops  Respiratory: Diminished breath sounds bilateral.  Scattered rales bilateral.  Bibasilar wheeze.  Good air movement.  Abdomen: Soft/+BS, non tender, non distended, no guarding  Extremities: No edema, No lymphangitis, No petechiae, No rashes, no synovitis   Data Reviewed: I have personally reviewed following labs and imaging studies Basic Metabolic Panel: Recent Labs  Lab 09/02/17 0517 09/03/17 0435 09/04/17 0448 09/06/17 0547  NA 141 141 140 139  K 3.9 3.8 3.3* 3.5  CL 103 102 98* 97*  CO2 28 29 31  32  GLUCOSE 142* 152* 125* 147*  BUN 42* 47* 45* 44*  CREATININE 0.86 0.85 0.90 0.78  CALCIUM 8.7* 8.6* 8.5* 8.2*   Liver Function Tests: No results for input(s): AST, ALT, ALKPHOS, BILITOT, PROT, ALBUMIN in the last 168 hours. No results for input(s): LIPASE, AMYLASE in the last 168 hours. No results for input(s): AMMONIA in the last 168 hours. Coagulation Profile: No results for input(s): INR, PROTIME in the last 168 hours. CBC: Recent Labs  Lab 09/02/17 0517 09/02/17 2336 09/06/17 0547  WBC 9.6 12.2* 12.5*  HGB 12.5 12.4 13.4  HCT 38.4 37.9 41.9  MCV 90.1 89.8 91.1  PLT 165 177 193   Cardiac Enzymes: No results for input(s): CKTOTAL, CKMB, CKMBINDEX, TROPONINI in the last 168 hours. BNP: Invalid input(s): POCBNP CBG: Recent Labs  Lab 09/04/17 0822 09/05/17 0739 09/06/17 0802 09/07/17 0757 09/08/17 0643  GLUCAP 137* 142* 144* 152* 140*   HbA1C: No results for input(s): HGBA1C in the last 72 hours. Urine analysis:    Component  Value Date/Time   COLORURINE YELLOW 2018/01/15 1451   APPEARANCEUR HAZY (A) 2018/01/15 1451   LABSPEC 1.031 (H) 2018/01/15 1451   PHURINE 5.0 2018/01/15 1451   GLUCOSEU NEGATIVE 2018/01/15 1451   HGBUR NEGATIVE 2018/01/15 1451   BILIRUBINUR NEGATIVE 2018/01/15 1451   KETONESUR 5 (A) 2018/01/15 1451   PROTEINUR 30 (A) 2018/01/15 1451   UROBILINOGEN 0.2 10/25/2012 1303   NITRITE NEGATIVE 2018/01/15 1451   LEUKOCYTESUR NEGATIVE 2018/01/15 1451   Sepsis Labs: @LABRCNTIP (procalcitonin:4,lacticidven:4) ) Recent Results (from the past 240 hour(s))  Culture, sputum-assessment     Status: None   Collection Time: 2018/06/17  3:14 PM  Result Value Ref Range Status   Specimen Description EXPECTORATED SPUTUM  Final   Special Requests NONE  Final   Sputum evaluation   Final    Sputum specimen not acceptable for testing.  Please recollect.   CALLED TO DANIEL RN AT [id number] ON [phone] BY THOMPSON S. Performed at [facility], [address] [place], [place] [address]    Report Status 08/31/2017 FINAL  Final  MRSA PCR Screening     Status: None   Collection Time: 08/30/17  7:00 PM  Result Value Ref Range Status   MRSA by PCR NEGATIVE NEGATIVE Final    Comment:  The GeneXpert MRSA Assay (FDA approved for NASAL specimens only), is one component of a comprehensive MRSA colonization surveillance program. It is not intended to diagnose MRSA infection nor to guide or monitor treatment for MRSA infections. Performed at [facility], [address], [place], [place] [address]   Culture, expectorated sputum-assessment     Status: None   Collection Time: 08/31/17  4:50 AM  Result Value Ref Range Status   Specimen Description EXPECTORATED SPUTUM  Final   Special Requests NONE  Final   Sputum evaluation   Final    THIS SPECIMEN IS ACCEPTABLE FOR SPUTUM CULTURE Performed at [facility] Performed at [facility], [address], [place], [place] [address]    Report Status 08/31/2017  FINAL  Final  Culture, respiratory (NON-Expectorated)     Status: None   Collection Time: 08/31/17  4:50 AM  Result Value Ref Range Status   Specimen Description   Final    EXPECTORATED SPUTUM Performed at [facility], [address], [place], [place] [address]    Special Requests   Final    NONE Reflexed from [phone] Performed at [facility], [address] [place], [place] [address]    Gram Stain   Final    MODERATE WBC PRESENT,BOTH PMN AND MONONUCLEAR FEW GRAM VARIABLE ROD RARE GRAM POSITIVE COCCI RARE YEAST ABUNDANT SQUAMOUS EPITHELIAL CELLS PRESENT    Culture   Final    Consistent with normal respiratory flora. Performed at [facility], [address] [place], [place] [address]    Report Status 09/03/2017 FINAL  Final  MRSA PCR Screening     Status: None   Collection Time: 09/01/17  5:19 PM  Result Value Ref Range Status   MRSA by PCR NEGATIVE NEGATIVE Final    Comment:        The GeneXpert MRSA Assay (FDA approved for NASAL specimens only), is one component of a comprehensive MRSA colonization surveillance program. It is not intended to diagnose MRSA infection nor to guide or monitor treatment for MRSA infections. Performed at [facility], [address], [place], [place] [address]      Scheduled Meds: . amiodarone  200 mg Oral Daily  . bisacodyl  10 mg Rectal Daily  . budesonide (PULMICORT) nebulizer solution  0.5 mg Nebulization BID  . feeding supplement (ENSURE ENLIVE)  237 mL Oral BID BM  . fluticasone  1 spray Each Nare Daily  . furosemide  40 mg Oral Daily  . ipratropium-albuterol  3 mL Nebulization TID  . magic mouthwash w/lidocaine  5 mL Oral QID  . metoprolol tartrate  12.5 mg Oral BID  . pantoprazole  40 mg Oral Daily  . predniSONE  50 mg Oral Q breakfast  . rivaroxaban  15 mg Oral Q supper  . rOPINIRole  0.5 mg Oral QHS   Continuous Infusions:  Procedures/Studies: Ct Chest Wo Contrast  Result Date: 08/30/2017 CLINICAL DATA:   84 year old female with question esophageal injury with possible pneumomediastinum identified on 08/31/2017 CT. Patient given oral contrast evaluate for extraluminal contrast/esophageal injury. History of neck surgery in 05/2017. Also with cough and lethargy. EXAM: CT CHEST WITHOUT CONTRAST TECHNIQUE: Multidetector CT imaging of the chest was performed following the standard protocol without IV contrast. COMPARISON:  08/15/2017 chest CT and prior studies FINDINGS: Cardiovascular: Cardiomegaly and coronary artery/thoracic aortic atherosclerotic calcifications again noted. Ectatic ascending thoracic aorta measuring 3.8 cm again noted. No pericardial effusion. Mediastinum/Nodes: Oral contrast within the mid and distal esophagus and  proximal stomach identified. There is no evidence of extraluminal contrast or mediastinal air on today's study. Shotty mediastinal and bilateral hilar nodes are again noted. Lungs/Pleura: Diffuse ground-glass, interstitial and airspace opacities within both lungs are again noted appear slightly increased. Small bilateral pleural effusions are again noted, left-greater-than-right. There is no evidence of pneumothorax. Upper Abdomen: No acute abnormality. A small hiatal hernia is noted. Musculoskeletal: No acute or suspicious abnormalities. IMPRESSION: 1. No evidence of extraluminal oral contrast or mediastinal air on today's study. 2. Diffuse ground-glass, interstitial and airspace opacities bilaterally which appear slightly increased. This is nonspecific but may represent diffuse edema and/or infection. 3. Small bilateral pleural effusions, cardiomegaly and small hiatal hernia again noted. 4. Ectatic ascending thoracic aorta again noted measuring 3.8 cm. 5. Coronary artery and aortic Atherosclerosis (ICD10-I70.0). Electronically Signed   By: Harmon Pier M.D.   On: 08/30/2017 10:38   Ct Angio Chest Pe W Or Wo Contrast  Result Date: 08/30/2017 CLINICAL DATA:  Bronchitis 2-3 weeks with  completion of antibiotics and continued cough and lethargy. EXAM: CT ANGIOGRAPHY CHEST WITH CONTRAST TECHNIQUE: Multidetector CT imaging of the chest was performed using the standard protocol during bolus administration of intravenous contrast. Multiplanar CT image reconstructions and MIPs were obtained to evaluate the vascular anatomy. CONTRAST:  80mL ISOVUE-370 IOPAMIDOL (ISOVUE-370) INJECTION 76% COMPARISON:  Chest x-ray 08/27/2017 and 01/25/2017 as well as abdominal CT 03/12/2008 FINDINGS: Cardiovascular: Borderline cardiomegaly. Minimal calcified plaque over the left anterior descending and lateral circumflex coronary arteries. Calcified plaque over the thoracic aorta. There is mild ectasia of the ascending thoracic aorta measuring 3.8 cm in AP diameter. No evidence of dissection. Pulmonary arterial system is well opacified without evidence of emboli. Mediastinum/Nodes: No evidence of mediastinal or hilar adenopathy. Suggestion of a small hiatal hernia. Small collection of air adjacent the left inferior heart border and few tiny foci of air adjacent the distal esophagus and gastroesophageal junction likely minimal pneumomediastinum. Lungs/Pleura: Lungs are somewhat hypoinflated demonstrate heterogeneous bilateral mixed interstitial airspace process. Small bilateral pleural effusions left greater than right. No cavitary lesion identified. Several air bronchograms. Airways are otherwise within normal. Upper Abdomen: 1.5 cm cyst over the anterior segment right lobe of the liver. Musculoskeletal: Degenerative change of the spine. Review of the MIP images confirms the above findings. IMPRESSION: No evidence of pulmonary embolism. Bilateral patchy heterogeneous mixed interstitial airspace process with small bilateral pleural effusions with left greater than right. Findings likely due to diffuse multifocal pneumonia. Findings suggesting minimal pneumomediastinum as this may be secondary to esophageal injury given a  few small foci of air adjacent the esophagus. Mild cardiomegaly.  Mild atherosclerotic coronary artery disease. Ectasia of the ascending thoracic aorta measuring 3.8 cm in AP diameter. Recommend annual imaging followup by CTA or MRA. This recommendation follows 2010 ACCF/AHA/AATS/ACR/ASA/SCA/SCAI/SIR/STS/SVM Guidelines for the Diagnosis and Management of Patients with Thoracic Aortic Disease. Circulation.2010; 121: R604-V409. 1.5 cm liver cyst. Aortic Atherosclerosis (ICD10-I70.0). Critical Value/emergent results were called by telephone at the time of interpretation on 08/25/2017 at 2:33 pm to Dr. Effie Shy, who verbally acknowledged these results. Electronically Signed   By: Elberta Fortis M.D.   On: 08/21/2017 14:33   Dg Chest Port 1 View  Result Date: 09/04/2017 CLINICAL DATA:  Shortness of breath EXAM: PORTABLE CHEST 1 VIEW COMPARISON:  August 31, 2017 FINDINGS: The cardiomediastinal silhouette is stable. Diffuse bilateral pulmonary opacities are unchanged. IMPRESSION: No interval change in diffuse bilateral pulmonary opacities. Electronically Signed   By: Gerome Sam III M.D  On: 09/04/2017 09:08   Dg Chest Port 1 View  Result Date: 08/31/2017 CLINICAL DATA:  Hypoxia, shortness of breath. Admitted with pneumonia and sepsis. EXAM: PORTABLE CHEST 1 VIEW COMPARISON:  CT chest August 30, 2017 and chest radiograph August 29, 2017 FINDINGS: Diffuse interstitial and alveolar airspace opacities, small suspected pleural effusions. Cardiac silhouette is normal in size. Calcified aortic knob. Fullness of the RIGHT hilum. No pneumothorax. ACDF. IMPRESSION: Similar and alveolar airspace opacities concerning for pneumonia. Fullness of the RIGHT hila corresponding to vascular shadows on prior radiograph. Aortic Atherosclerosis (ICD10-I70.0). Electronically Signed   By: Awilda Metro M.D.   On: 08/31/2017 22:40   Dg Chest Port 1 View  Result Date: 09/05/2017 CLINICAL DATA:  Shortness of breath with cough  and wheezing as well as weakness. EXAM: PORTABLE CHEST 1 VIEW COMPARISON:  01/25/2017 FINDINGS: Lungs are adequately inflated and demonstrate moderate bilateral patchy mixed interstitial airspace density. Possible cavitary process in the right infrahilar region. No definite effusion. Cardiac silhouette is within normal. Fusion hardware over the cervical spine. Remainder of the exam is unchanged. IMPRESSION: Moderate patchy bilateral mixed interstitial airspace process with possible cavitary process in the right infrahilar region likely due to infection. Recommend follow-up to resolution. Electronically Signed   By: Elberta Fortis M.D.   On: 09/05/2017 10:44    Catarina Hartshorn, DO  [facility] Pager [phone]  If 7PM-7AM, please contact night-coverage [email or web address] Password [facility] 09/08/2017, 12:32 PM   LOS: 10 days

## 2017-09-08 NOTE — Clinical Social Work Note (Signed)
CSW following. Pt has been accepted [facility]. Spoke with pt's daughter by phone to update. They will accept the bed there upon dc. Updated MD who is hoping pt will be stable for dc in the next 1-2 days. Contacted the admissions coordinator at [facility] to update.   Covering CSW will follow up tomorrow.

## 2017-09-08 NOTE — Progress Notes (Signed)
Responded to nursing call:  Abnormal CXR results   Subjective: Patient resting comfortably as I entered room.  She states her breathing is  as great as this morning.  Overall she states that her breathing is better than for 5 days ago.  She denies any chest pain, shortness of breath, hemoptysis.  Vitals:   09/07/17 2130 09/07/17 2300 09/08/17 0555 09/08/17 1434  BP: 126/67  106/66   Pulse: 84  78   Resp: 20  20   Temp: 98.4 F (36.9 C)  98 F (36.7 C)   TempSrc: Oral  Oral   SpO2: 98% 92% 91% 94%  Weight:   93.4 kg (205 lb 14.6 oz)   Height:       CV--RRR Lung--bilateral crackles.  No wheezing.  Good air movement. Abd--soft+BS/NT   Assessment/Plan: Right apical pneumothorax -Personally reviewed chest x-ray--tiny right apical pneumothorax -Consult to general surgery--spoke with Dr. Henreitta LeberBridges -Remained stable on HFNC 10L with saturation 94-95% -Etiology unclear as the patient has not had any invasive procedures or central lines during this hospitalization     Catarina Hartshornavid Everlee Quakenbush, DO [facility]

## 2017-09-08 NOTE — Progress Notes (Signed)
  Speech Language Pathology Treatment: Dysphagia  Patient Details Name: Lisa GamblerDorothy W Vidrio MRN: [id number] DOB: 12/14/1927 Today's Date: 09/08/2017 Time: [id number] SLP Time Calculation (min) (ACUTE ONLY): 25 min  Assessment / Plan / Recommendation Clinical Impression  Pt seen with breakfast meal. Pt had not touched her biscuit, eggs, or sausage due to oral pain. SLP inspected oral cavity and noted erythema and ulceration along posterior oral cavity and tongue. Pt stated that she knows she needs to eat and that she is hungry, but she is unable due to pain. SLP fed Pt grits (smoothed with butter, milk, etc) and Pt self presented sips of thin. Pt with occasional delayed dry cough. D/W RN, who stated that Pt is receiving nystatin only and suggested Magic Mouthwash with viscous lidocaine- will pass along to MD. Pt currently tolerating: Magic Cup, ice cream, cream based soups, puddings, pureed fruits, and smooth/loose purees. Will downgrade to D2/finely chopped and thin while Pt still having oral pain. Pt in agreement with plan of care.   HPI HPI: 84 y.o. female  with past medical history significant for CKD stage III, GERD, hypertension, paroxysmal atrial fibrillation on Xarelto, and peripheral vascular disease who presented to the emergency with worsening cough and shortness of breath over the last 2-3 weeks.  She has been diagnosed with sepsis secondary to multilobular pneumonia and was initially thought to have some pneumomediastinum, but this appears to have resolved on repeat CT chest with gastrografin on 2/18. acute on chronic CHF,on BIPAP, IV lasix started admitted on 08/30/2017 with community-acquired pneumonia.       SLP Plan  Continue with current plan of care(will d/w RD about diet order)       Recommendations  Diet recommendations: Dysphagia 2 (fine chop);Thin liquid Liquids provided via: Cup;Straw Medication Administration: Crushed with puree Supervision: Patient able to self  feed Compensations: Small sips/bites Postural Changes and/or Swallow Maneuvers: Seated upright 90 degrees;Upright 30-60 min after meal                Oral Care Recommendations: Oral care BID;Staff/trained caregiver to provide oral care Follow up Recommendations: 24 hour supervision/assistance SLP Visit Diagnosis: Dysphagia, oral phase (R13.11) Plan: Continue with current plan of care(will d/w RD about diet order)       Thank you,  Havery MorosDabney Porter, CCC-SLP [phone]                 PORTER,DABNEY 09/08/2017, 10:27 AM

## 2017-09-08 NOTE — [place] FL2 (Signed)
[place] MEDICAID FL2 LEVEL OF CARE SCREENING TOOL     IDENTIFICATION  Patient Name: [name] Birthdate: 03-25-1928 Sex: female Admission Date (Current Location): 08/20/2017  [facility] and [place] Number:  [employer] and Address:  [facility],  [address] [place] [address]      Provider Number: [id number]  Attending Physician Name and Address:  Catarina Hartshorn, MD  Relative Name and Phone Number:       Current Level of Care: Hospital Recommended Level of Care: Skilled Nursing Facility Prior Approval Number:    Date Approved/Denied:   PASRR Number: [id number]  Discharge Plan: SNF    Current Diagnoses: Patient Active Problem List   Diagnosis Date Noted  . DNR (do not resuscitate) discussion   . Community acquired pneumonia   . Goals of care, counseling/discussion   . Palliative care by specialist   . Pneumonia 08/30/2017  . Acute hypoxemic respiratory failure (HCC) 09/04/2017  . Multifocal pneumonia 09/08/2017  . Elevated troponin level 08/16/2017  . Sepsis (HCC) 08/22/2017  . Hemorrhoids   . Constipation due to pain medication   . Chronic low back pain with sciatica   . Benign essential HTN   . Paroxysmal atrial fibrillation    . Chronic constipation   . Gastroesophageal reflux disease   . RLS (restless legs syndrome)   . DDD (degenerative disc disease), cervical   . Cervical stenosis of spine 05/24/2017  . Cervical myelopathy (HCC) 05/24/2017  . AF (paroxysmal atrial fibrillation) (HCC) 05/24/2017  . Chronic diastolic CHF (congestive heart failure) (HCC) 05/24/2017  . CKD (chronic kidney disease) stage 3, GFR 30-59 ml/min (HCC) 05/24/2017  . Essential hypertension 05/24/2017  . Right sided weakness 05/22/2017  . Neuropathic pain 05/26/2013  . Restless leg syndrome 05/26/2013  . OA (osteoarthritis) of knee 11/07/2012    Orientation RESPIRATION BLADDER Height & Weight     Self, Time, Situation, Place  O2(HFNC 7L) Continent  Weight: 205 lb 14.6 oz (93.4 kg) Height:  5\' 4"  (162.6 cm)  BEHAVIORAL SYMPTOMS/MOOD NEUROLOGICAL BOWEL NUTRITION STATUS      Continent Diet  AMBULATORY STATUS COMMUNICATION OF NEEDS Skin   Extensive Assist Verbally Normal                       Personal Care Assistance Level of Assistance  Bathing, Feeding, Dressing   Feeding assistance: Independent Dressing Assistance: Limited assistance     Functional Limitations Info  Sight, Hearing, Speech Sight Info: Adequate Hearing Info: Adequate Speech Info: Adequate    SPECIAL CARE FACTORS FREQUENCY  PT (By licensed PT)     PT Frequency: 5x/week              Contractures Contractures Info: Not present    Additional Factors Info  Code Status, Allergies, Psychotropic Code Status Info: DNR Allergies Info: Gabapentin, Hctz, Lasix, Penicillins, Tramadol-acetaminophen,  Psychotropic Info: Xanax         Current Medications (09/08/2017):  This is the current hospital active medication list Current Facility-Administered Medications  Medication Dose Route Frequency Provider Last Rate Last Dose  . acetaminophen (TYLENOL) tablet 650 mg  650 mg Oral Q6H PRN Maurilio Lovely D, DO   650 mg at 09/04/17 0102   Or  . acetaminophen (TYLENOL) suppository 650 mg  650 mg Rectal Q6H PRN Sherryll Burger, Pratik D, DO      . albuterol (PROVENTIL) (2.5 MG/3ML) 0.083% nebulizer solution 2.5 mg  2.5 mg Nebulization Q4H PRN Sherryll Burger, Pratik  D, DO      . ALPRAZolam Prudy Feeler(XANAX) tablet 0.25 mg  0.25 mg Oral Q12H PRN Vassie LollMadera, Carlos, MD   0.25 mg at 09/08/17 0138  . amiodarone (PACERONE) tablet 200 mg  200 mg Oral Daily Vassie LollMadera, Carlos, MD   200 mg at 09/08/17 0909  . bisacodyl (DULCOLAX) suppository 10 mg  10 mg Rectal Daily Sherryll BurgerShah, Pratik D, DO   10 mg at 09/08/17 0910  . budesonide (PULMICORT) nebulizer solution 0.5 mg  0.5 mg Nebulization BID Vassie LollMadera, Carlos, MD   0.5 mg at 09/08/17 0817  . feeding supplement (ENSURE ENLIVE) (ENSURE ENLIVE) liquid 237 mL  237 mL Oral BID  BM Shah, Pratik D, DO   237 mL at 09/07/17 1512  . fluticasone (FLONASE) 50 MCG/ACT nasal spray 1 spray  1 spray Each Nare Daily Vassie LollMadera, Carlos, MD   1 spray at 09/08/17 0910  . furosemide (LASIX) tablet 40 mg  40 mg Oral Daily Vassie LollMadera, Carlos, MD   40 mg at 09/08/17 [id number]  . ipratropium-albuterol (DUONEB) 0.5-2.5 (3) MG/3ML nebulizer solution 3 mL  3 mL Nebulization TID Sherryll BurgerShah, Pratik D, DO   3 mL at 09/08/17 0817  . metoprolol tartrate (LOPRESSOR) tablet 12.5 mg  12.5 mg Oral BID Vassie LollMadera, Carlos, MD   12.5 mg at 09/08/17 0909  . nystatin (MYCOSTATIN) 100000 UNIT/ML suspension 500,000 Units  5 mL Oral QID Vassie LollMadera, Carlos, MD   500,000 Units at 09/08/17 0909  . ondansetron (ZOFRAN) tablet 4 mg  4 mg Oral Q6H PRN Sherryll BurgerShah, Pratik D, DO       Or  . ondansetron (ZOFRAN) injection 4 mg  4 mg Intravenous Q6H PRN Sherryll BurgerShah, Pratik D, DO      . pantoprazole (PROTONIX) EC tablet 40 mg  40 mg Oral Daily Vassie LollMadera, Carlos, MD   40 mg at 09/08/17 0908  . predniSONE (DELTASONE) tablet 50 mg  50 mg Oral Q breakfast Vassie LollMadera, Carlos, MD   50 mg at 09/08/17 0908  . Rivaroxaban (XARELTO) tablet 15 mg  15 mg Oral Q supper Vassie LollMadera, Carlos, MD   15 mg at 09/07/17 1754  . rOPINIRole (REQUIP) tablet 0.5 mg  0.5 mg Oral QHS Vassie LollMadera, Carlos, MD   0.5 mg at 09/07/17 2306     Discharge Medications: Please see discharge summary for a list of discharge medications.  Relevant Imaging Results:  Relevant Lab Results:   Additional Information SSN # [id number]  Demont Linford, Juleen ChinaHeather D, LCSW

## 2017-09-08 NOTE — Consult Note (Signed)
[place]  Reason for Consult: Pneumothorax on right   Referring Physician: Dr. Carles Collet   Chief Complaint    Shortness of Breath      [name] is a 84 y.o. female.  HPI: [name] is a 84 yo patient with CKD , GERD, HTN, A fib on Xarelto  who has been in the hospital since 2/17 after having a 2-3 week history of cough and SOB on steroids and doxycycline. She underwent a CT with findings of PNA, CHF, and was found to have pneumomediastinum around her esophagus distally, and a repeat CT scan with gastrografin did not demonstrate any leak and the pneumomediastinum resolved.  She has been in the hospital on HFNC and receiving antibiotics and steroids for pneumonia as well as getting diuresed for her CHF. Today she was having some increase in her O2 and coughing, and a CXR was performed that demonstrated a right apical pneumothorax and subcutaneous air bilaterally.  She has fairly extensive lung disease bilaterally and effusion on the CT.  She has had a lingering WBC but again has been on steroids and antibiotics.  She reports that she has been on a diet, but has not been taking in very much to eat because her mouth hurts.  She denies an retching or vomiting, but has been coughing.    Of note she did undergo an anterior cervical surgery 05/2017 with no reports of any issues post operatively regarding infection or drainage or concern.   Her repeat images since the initial CT 2/17 have not demonstrated any subcutaneous air or concern for any pneumomediastinum or pneumothorax. No evaluation of her cervical esophagus was performed and no thin barium esophagram performed, just the CT chest.  No surgery consult was obtained until today and this was regarding the pneumothorax and subcutaneous air on the CXR.  The patient essentially been improving since admission per the hospitalist notes and report. She has had no NG or ET or other tube per their report.   Past Medical  History:  Diagnosis Date  . Arthritis   . Chronic kidney disease   . Depression   . Dysrhythmia 8/13   palpitations/ now resolved  . GERD (gastroesophageal reflux disease)   . History of blood transfusion 2012  . Hypertension   . Peripheral vascular disease (HCC)    varicose veins bilaterally   . Restless legs syndrome     Past Surgical History:  Procedure Laterality Date  . ABDOMINAL HYSTERECTOMY    . ANTERIOR CERVICAL DECOMP/DISCECTOMY FUSION N/A 05/24/2017   Procedure: ANTERIOR CERVICAL DECOMPRESSION/DISCECTOMY FUSION CERVICAL THREE-FOUR, CERVICAL FIVE-SIX, CERVICAL SIX-SEVEN;  Surgeon: Earnie Larsson, MD;  Location: [facility];  Service: Neurosurgery;  Laterality: N/A;  . APPENDECTOMY    . EYE SURGERY Bilateral    cataract extraction with IOL  . JOINT REPLACEMENT Right    knee  . SEPTOPLASTY     repair deviated septum  . TOTAL KNEE ARTHROPLASTY Left 11/07/2012   Procedure: LEFT TOTAL KNEE ARTHROPLASTY;  Surgeon: Gearlean Alf, MD;  Location: [facility];  Service: Orthopedics;  Laterality: Left;  Marland Kitchen VARICOSE VEIN SURGERY Bilateral     Family History  Problem Relation Age of Onset  . Heart attack Father   . Stroke Father   . Hypertension Father   . COPD Mother   . Lung cancer Sister   . Uterine cancer Sister   . Colon polyps Daughter   . Colon cancer Unknown     Social History  Tobacco Use  . Smoking status: Never Smoker  . Smokeless tobacco: Never Used  Substance Use Topics  . Alcohol use: No  . Drug use: No    Medications:  I have reviewed the patient's current medications. Prior to Admission:  Medications Prior to Admission  Medication Sig Dispense Refill Last Dose  . acetaminophen-codeine (TYLENOL #4) 300-60 MG tablet Take 1 tablet by mouth every 4 (four) hours as needed for pain.   09/03/2017 at Unknown time  . albuterol (VENTOLIN HFA) 108 (90 Base) MCG/ACT inhaler Inhale 2 puffs daily as needed into the lungs for wheezing or shortness of breath.    08/28/2017 at  Unknown time  . ALPRAZolam (XANAX) 0.25 MG tablet Take 0.25 mg at bedtime as needed by mouth for anxiety or sleep.    08/28/2017 at Unknown time  . amiodarone (PACERONE) 200 MG tablet Take 200 mg at bedtime by mouth.    08/28/2017 at Unknown time  . amLODipine (NORVASC) 5 MG tablet Take 2.5 mg daily after breakfast by mouth.    08/28/2017 at Unknown time  . bisacodyl (DULCOLAX) 10 MG suppository Place 10 mg daily rectally.   Past Month at Unknown time  . cetirizine (ZYRTEC) 10 MG tablet Take 10 mg by mouth daily.   08/28/2017 at Unknown time  . conjugated estrogens (PREMARIN) vaginal cream Place 1 application at bedtime as needed vaginally.   Past Week at Unknown time  . Cyanocobalamin (VITAMIN B-12 IJ) Inject 1 each every 30 (thirty) days as directed.   Past Month at Unknown time  . docusate sodium (COLACE) 100 MG capsule Take 100 mg by mouth 2 (two) times daily.   08/28/2017 at Unknown time  . estradiol (ESTRACE) 1 MG tablet Take 1 mg by mouth daily.   08/28/2017 at Unknown time  . fluticasone (FLONASE) 50 MCG/ACT nasal spray Place 1 spray daily as needed into both nostrils for allergies.    08/28/2017 at Unknown time  . metoprolol succinate (TOPROL-XL) 25 MG 24 hr tablet Take 1 tablet (25 mg total) by mouth 2 (two) times daily. 60 tablet 0 08/28/2017 at 1800  . metoprolol tartrate (LOPRESSOR) 25 MG tablet Take 1 tablet by mouth 2 (two) times daily.   08/28/2017 at 1800  . oseltamivir (TAMIFLU) 75 MG capsule Take 1 capsule by mouth daily.   08/28/2017 at Unknown time  . pantoprazole (PROTONIX) 40 MG tablet Take 40 mg by mouth daily.   08/28/2017 at Unknown time  . rOPINIRole (REQUIP) 0.5 MG tablet Take 1 mg by mouth at bedtime.   08/28/2017 at Unknown time  . XARELTO 15 MG TABS tablet Take 15 mg daily by mouth.    08/28/2017 at Unknown time   Scheduled: . amiodarone  200 mg Oral Daily  . bisacodyl  10 mg Rectal Daily  . budesonide (PULMICORT) nebulizer solution  0.5 mg Nebulization BID  . feeding  supplement (ENSURE ENLIVE)  237 mL Oral BID BM  . fluticasone  1 spray Each Nare Daily  . furosemide  40 mg Oral Daily  . ipratropium-albuterol  3 mL Nebulization TID  . magic mouthwash  2.5 mL Oral QID   And  . lidocaine  2.5 mL Mouth/Throat QID  . methylPREDNISolone (SOLU-MEDROL) injection  40 mg Intravenous Q12H  . metoprolol tartrate  12.5 mg Oral BID  . pantoprazole  40 mg Oral Daily  . rivaroxaban  15 mg Oral Q supper  . rOPINIRole  0.5 mg Oral QHS  . valACYclovir  1,000 mg  Oral BID   Continuous: . potassium chloride 10 mEq (09/08/17 1846)   ZOX:[id number] **OR** acetaminophen, albuterol, ALPRAZolam, ondansetron **OR** ondansetron (ZOFRAN) IV  Allergies: Allergies  Allergen Reactions  . Gabapentin     Cardiac concerns (d/c by cardiologist)  . Hctz [Hydrochlorothiazide]     Dizziness   . Lasix [Furosemide]     fatigue   . Penicillins Hives and Itching  . Tramadol-Acetaminophen     GI upset      ROS:  A comprehensive review of systems was negative except for: Ears, nose, mouth, throat, and face: positive for sore mouth Respiratory: positive for cough, pneumonia and SOB  Blood pressure 106/66, pulse 78, temperature 98 F (36.7 C), temperature source Oral, resp. rate 20, height '5\' 4"'$  (1.626 m), weight 205 lb 14.6 oz (93.4 kg), SpO2 94 %. Physical Exam  Constitutional: She is oriented to person, place, and time and well-developed, well-nourished, and in no distress.  HENT:  Head: Normocephalic.  Eyes: Pupils are equal, round, and reactive to light.  Neck: Normal range of motion. Neck supple.  Cervical incision healed, no signs of drainage or infection  Cardiovascular: Normal rate. An irregular rhythm present.  Pulmonary/Chest: Effort normal. No respiratory distress.  Subcutaneous air noted in the right > left chest, none noted in the neck  Abdominal: Soft. She exhibits no distension. There is no tenderness.  Neurological: She is alert and oriented to person,  place, and time.  Skin: Skin is warm and dry.  Psychiatric: Mood, memory, affect and judgment normal.    Results: Results for orders placed or performed during the hospital encounter of 08/18/2017 (from the past 48 hour(s))  Glucose, capillary     Status: Abnormal   Collection Time: 09/07/17  7:57 AM  Result Value Ref Range   Glucose-Capillary 152 (H) 65 - 99 mg/dL   Comment 1 Notify RN    Comment 2 Document in Chart   Glucose, capillary     Status: Abnormal   Collection Time: 09/08/17  6:43 AM  Result Value Ref Range   Glucose-Capillary 140 (H) 65 - 99 mg/dL  Basic metabolic panel     Status: Abnormal   Collection Time: 09/08/17 12:59 PM  Result Value Ref Range   Sodium 138 135 - 145 mmol/L   Potassium 3.2 (L) 3.5 - 5.1 mmol/L   Chloride 94 (L) 101 - 111 mmol/L   CO2 30 22 - 32 mmol/L   Glucose, Bld 137 (H) 65 - 99 mg/dL   BUN 55 (H) 6 - 20 mg/dL   Creatinine, Ser 0.97 0.44 - 1.00 mg/dL   Calcium 8.6 (L) 8.9 - 10.3 mg/dL   GFR calc non Af Amer 51 (L) >60 mL/min   GFR calc Af Amer 59 (L) >60 mL/min    Comment: (NOTE) The eGFR has been calculated using the CKD EPI equation. This calculation has not been validated in all clinical situations. eGFR's persistently <60 mL/min signify possible Chronic Kidney Disease.    Anion gap 14 5 - 15    Comment: Performed at [facility], [address], [place], [place] [address]  CBC     Status: Abnormal   Collection Time: 09/08/17 12:59 PM  Result Value Ref Range   WBC 17.5 (H) 4.0 - 10.5 K/uL   RBC 5.21 (H) 3.87 - 5.11 MIL/uL   Hemoglobin 15.3 (H) 12.0 - 15.0 g/dL   HCT 46.9 (H) 36.0 - 46.0 %   MCV 90.0 78.0 - 100.0 fL   MCH  29.4 26.0 - 34.0 pg   MCHC 32.6 30.0 - 36.0 g/dL   RDW 14.5 11.5 - 15.5 %   Platelets 242 150 - 400 K/uL    Comment: Performed at [facility], [address], [place], [place] [address]  Procalcitonin - Baseline     Status: None   Collection Time: 09/08/17 12:59 PM  Result Value Ref Range   Procalcitonin  <0.10 ng/mL    Comment:        Interpretation: PCT (Procalcitonin) <= 0.5 ng/mL: Systemic infection (sepsis) is not likely. Local bacterial infection is possible. (NOTE)       Sepsis PCT Algorithm           Lower Respiratory Tract                                      Infection PCT Algorithm    ----------------------------     ----------------------------         PCT < 0.25 ng/mL                PCT < 0.10 ng/mL         Strongly encourage             Strongly discourage   discontinuation of antibiotics    initiation of antibiotics    ----------------------------     -----------------------------       PCT 0.25 - 0.50 ng/mL            PCT 0.10 - 0.25 ng/mL               OR       >80% decrease in PCT            Discourage initiation of                                            antibiotics      Encourage discontinuation           of antibiotics    ----------------------------     -----------------------------         PCT >= 0.50 ng/mL              PCT 0.26 - 0.50 ng/mL               AND        <80% decrease in PCT             Encourage initiation of                                             antibiotics       Encourage continuation           of antibiotics    ----------------------------     -----------------------------        PCT >= 0.50 ng/mL                  PCT > 0.50 ng/mL               AND         increase in PCT  Strongly encourage                                      initiation of antibiotics    Strongly encourage escalation           of antibiotics                                     -----------------------------                                           PCT <= 0.25 ng/mL                                                 OR                                        > 80% decrease in PCT                                     Discontinue / Do not initiate                                             antibiotics Performed at [facility], [address],  [place],  [address]    CXR reviewed- subcutaneous air bilaterally, small right apical pneumothorax   CT scans reviewed with the reading radiologists at the time of read: Extensive pneumomediastinum, subcutaneous air, bilateral apical pneumothoraces, some air tracking into the cervical trachea area and seems to be more tracking up based on the radiologist interpretation and adjacent to the trachea   Ct Soft Tissue Neck W Contrast  Result Date: 09/08/2017 CLINICAL DATA:  Follow-up examination for pneumothorax. Patient with known right-sided pneumothorax. EXAM: CT NECK WITH CONTRAST TECHNIQUE: Multidetector CT imaging of the neck was performed using the standard protocol following the bolus administration of intravenous contrast. CONTRAST:  38m ISOVUE-300 IOPAMIDOL (ISOVUE-300) INJECTION 61% COMPARISON:  Prior radiograph from 08/25/2017. FINDINGS: Pharynx and larynx: Oral cavity within normal limits without mass lesion or loculated fluid collection. Extensive streak artifact from dental amalgam. No acute inflammatory changes about the dentition. Oropharynx within normal limits. Parapharyngeal fat preserved. Nasopharynx normal. No retropharyngeal collection or edema. Few small subcentimeter nodular densities seen along the anterior margin of the epiglottis, indeterminate, but could reflect small polyps (series 12, image 40). Remainder of the hypopharynx and supraglottic larynx without acute abnormality. True cords symmetric and normal. Subglottic airway tortuous but widely patent. Salivary glands: Parotid and submandibular glands within normal limits. Tiny calcification at the inferior aspect of the right submandibular gland favored to be vascular in nature. Thyroid: Thyroid within normal limits. Lymph nodes: No adenopathy within the neck. Vascular: Normal intravascular enhancement seen throughout the neck. Atherosclerosis noted. Limited intracranial: Unremarkable. Visualized orbits: Visualized globes and  orbital  soft tissues within normal limits. Patient status post cataract extraction bilaterally. Mastoids and visualized paranasal sinuses: Chronic sphenoid sinusitis noted. Visualized paranasal sinuses are otherwise largely clear. Visualized mastoids and middle ear cavities are clear. Skeleton: No acute osseous abnormality. No worrisome lytic or blastic osseous lesions. Patient status post cervical fusion at C3 through C7. Multilevel facet arthropathy noted. Prominent degenerative thickening noted at the tectorial membrane. Upper chest: Pneumomediastinum noted within the visualized upper mediastinum. Right-sided pneumothorax, incompletely visualized. Associated soft tissue emphysema within the bilateral anterolateral chest wall. Diffuse ground-glass opacity with interlobular septal thickening within the visualized lungs, suggesting pulmonary edema/CHF. Superimposed infection not excluded. Other: Gas lucency present within the right carotid space, extending inferiorly from known right apical pneumothorax. Associated soft tissue emphysema within the visualized anterolateral chest wall bilaterally. IMPRESSION: 1. Right-sided pneumothorax, incompletely visualized. Associated soft tissue emphysema within the anterolateral chest wall bilaterally, extending superiorly within the carotid space of the right neck. Etiology unclear. 2. Extensive ground-glass opacity with interlobular septal thickening within the visualized lungs, suggesting pulmonary edema/CHF. Findings better evaluated on concomitant CT of the chest. Superimposed infection would be difficult to exclude. 3. No other acute abnormality within the neck. 4. Few subcentimeter nodular densities along the anterior margin of the epiglottis as above, indeterminate, but could reflect a few small polyps. Correlation with physical exam/direct visualization recommended. 5. Chronic sphenoid sinusitis. Electronically Signed   By: Jeannine Boga M.D.   On: 09/08/2017  18:18   Ct Chest W Contrast  Result Date: 09/08/2017 CLINICAL DATA:  Shortness of breath with cough for 2 or 3 weeks. History of hypertension, reflux and chronic kidney disease. Abnormal chest x-ray today demonstrating subcutaneous emphysema, small right pneumothorax and pneumomediastinum. EXAM: CT CHEST WITH CONTRAST TECHNIQUE: Multidetector CT imaging of the chest was performed during intravenous contrast administration. CONTRAST:  64m ISOVUE-300 IOPAMIDOL (ISOVUE-300) INJECTION 61% COMPARISON:  Radiographs 09/08/2017 and 09/04/2017. CT 08/21/2017 and 08/30/2017. FINDINGS: Cardiovascular: Diffuse atherosclerosis of the aorta, great vessels and coronary arteries. There is stable mild ectasia of the ascending aorta. The pulmonary arteries are normally opacified. Stable mild cardiomegaly. No pericardial effusion. Mediastinum/Nodes: There are no enlarged mediastinal, hilar or axillary lymph nodes.There is diffuse pneumomediastinum. There is no evidence mediastinal fluid collection, focal inflammation or esophageal abnormality. The trachea and thyroid gland appear unremarkable. Lungs/Pleura: Small biapical pneumothoraces. No significant pleural effusion. There are diffuse ground-glass and airspace opacities in both lungs which have mildly improved over the last 10 days. Areas consolidation remain in the right middle lobe. There is associated architectural distortion and mild bronchiectasis. No suspicious pulmonary nodule. Upper abdomen: The visualized upper abdomen has a stable appearance without acute findings. There is a small hepatic cyst. No abnormal air collections are seen in the upper abdomen. Musculoskeletal/Chest wall: There is no chest wall mass or suspicious osseous finding. There are degenerative changes throughout the thoracic spine status post cervical fusion. There is soft tissue emphysema in the lower neck and upper chest bilaterally. No focal fluid collection. Neck findings dictated separately.  IMPRESSION: 1. CT confirms the presence of extensive soft tissue emphysema in the lower neck and chest with pneumomediastinum and small biapical pneumothoraces. 2. No mediastinal fluid collection or focal esophageal abnormality identified. 3. The diffuse ground-glass and airspace opacities throughout both lungs have mildly improved over the last 10 days. 4.  Aortic Atherosclerosis (ICD10-I70.0). 5. Neck findings are dictated separately. 6. These results were called by telephone at the time of interpretation on 09/08/2017 at 6:12 pm to Dr. LCurlene Labrum,  who verbally acknowledged these results. Electronically Signed   By: Richardean Sale M.D.   On: 09/08/2017 18:12   Dg Chest Port 1 View  Result Date: 09/08/2017 CLINICAL DATA:  Hypoxia.  Progressive cough. EXAM: PORTABLE CHEST 1 VIEW COMPARISON:  Chest x-rays dated 09/04/2017 and 08/31/2017 and chest CT dated 08/30/2017 FINDINGS: Since the prior exam the patient has developed bilateral subcutaneous emphysema in the axillary regions as well as a tiny right apical pneumothorax. The diffuse bilateral pulmonary infiltrates have improved. Heart size and vascularity are normal. No appreciable effusion. IMPRESSION: 1. New small right apical pneumothorax. 2. New bilateral axillary subcutaneous emphysema. This indicates that the patient has bilateral air leaks. 3. Improved bilateral pulmonary infiltrates. Critical Value/emergent results were called by telephone at the time of interpretation on 09/08/2017 at 4:24 pm to Audrea Muscat, RN , who verbally acknowledged these results. Electronically Signed   By: Lorriane Shire M.D.   On: 09/08/2017 16:26    Assessment & Plan:  WENDELIN READER is a 84 y.o. female with extensive pneumomediastinum and air tracking into the subcutaneous tissue and into the right neck possibly tracking upward. Unsure at this time if this is something from her lung disease or the trachea/bronchial tree but she has extensive pneumomediastinum with  concerns for the etiology. If this were a esophageal leak one would think she would be significantly sicker/ or dead, and there would be fluid/ debris appreciated on the repeat CT.   -No Chest tube indicated at this time, PTX small bilaterally  -Patient stable on her current HFNC, unsure how long this pneumomediastinum has been re-acculumated given the 4 day time period between CXR  -Patient could decompensate if the air leak causes the pneumothorax to worsen and no longer tracks in the subcutaneous tissue and pneumomediastinum  -Have discussed with radiology  -Have discussed with Dr. Servando Snare, Thoracic Surgery On call, he has reviewed her imaging and thinks likely from a bleb that has ruptured and her lungs are so poorly compliant that they are sticking to the pleural; low suspicion for any esophageal leak or bronchial tree leak, suggests watching and monitoring -Dr. Servando Snare does not think further esophogeal investigation or imaging needed, could do a bronchoscopy but will likely not add to the evaluation  -Will order CXR for the AM and will plan to repeat in the evening tomorrow  -CXR if worsening distress  -Updated Dr. Carles Collet and will watch.   -Can have diet back, made NPO during workup given concerns   -Have discussed if worsening pneumothorax, placement of a chest tube. Patient is unsure if she would want this at this time, and realizes that without it that she could have respiratory collapse, cardiac collapse. Have discussed that given her lung disease, inflammation and scarring, that this tube might cause more damage to the lung and might have to remain in place for an extended period of time.  She is going to think about whether she would want a chest tube.  All questions were answered to the satisfaction of the patient and family.   Virl Cagey 09/08/2017, 7:22 PM

## 2017-09-08 NOTE — Clinical Social Work Note (Signed)
Clinical Social Work Assessment  Patient Details  Name: [name] MRN: [id number] Date of Birth: 02/19/1928  Date of referral:  09/08/17               Reason for consult:  Facility Placement                Permission sought to share information with:    Permission granted to share information::     Name::        Agency::     Relationship::     Contact Information:  daughter, [name]  Housing/Transportation Living arrangements for the past 2 months:  Single Family Home Source of Information:  Patient, Adult Children Patient Interpreter Needed:  None Criminal Activity/Legal Involvement Pertinent to Current Situation/Hospitalization:  No - Comment as needed Significant Relationships:  Adult Children Lives with:  Self Do you feel safe going back to the place where you live?  Yes Need for family participation in patient care:  Yes (Comment)  Care giving concerns:  None identified at baseline.    Social Worker assessment / plan:  Patient is independent at baseline. She ambulates with a walker and drives. Patient is agreeable to STR at SNF. Patient's daughter states that this acute illness has deconditioned patient quickly.  She states that she and her sister do patient's laundry and house cleaning. She reported that patient is independent ADLs and it is their hope that she get back to her baseline.   Employment status:  Retired Database administratornsurance information:  Managed Medicare PT Recommendations:  Skilled Nursing Facility Information / Referral to community resources:  Skilled Nursing Facility  Patient/Family's Response to care:  [facility]   Patient/Family's Understanding of and Emotional Response to Diagnosis, Current Treatment, and Prognosis:  Patient and family understand patient's diagnosis, treatment and prognosis.   Emotional Assessment Appearance:  Appears stated age Attitude/Demeanor/Rapport:    Affect (typically observed):   Accepting, Calm Orientation:  Oriented to Situation, Oriented to  Time, Oriented to Place, Oriented to Self Alcohol / Substance use:  Not Applicable Psych involvement (Current and /or in the community):  No (Comment)  Discharge Needs  Concerns to be addressed:  Discharge Planning Concerns Readmission within the last 30 days:  No Current discharge risk:  None Barriers to Discharge:  No Barriers Identified   Lisa NeedySettle, Lisa Zeitlin D, LCSW 09/08/2017, 9:37 AM

## 2017-09-09 ENCOUNTER — Inpatient Hospital Stay (HOSPITAL_COMMUNITY): Payer: Medicare Other

## 2017-09-09 DIAGNOSIS — T797XXD Traumatic subcutaneous emphysema, subsequent encounter: Secondary | ICD-10-CM

## 2017-09-09 DIAGNOSIS — J9383 Other pneumothorax: Secondary | ICD-10-CM

## 2017-09-09 LAB — CBC
HEMATOCRIT: 47.2 % — AB (ref 36.0–46.0)
HEMOGLOBIN: 15.6 g/dL — AB (ref 12.0–15.0)
MCH: 29.2 pg (ref 26.0–34.0)
MCHC: 33.1 g/dL (ref 30.0–36.0)
MCV: 88.4 fL (ref 78.0–100.0)
Platelets: 250 10*3/uL (ref 150–400)
RBC: 5.34 MIL/uL — ABNORMAL HIGH (ref 3.87–5.11)
RDW: 14.9 % (ref 11.5–15.5)
WBC: 17.4 10*3/uL — AB (ref 4.0–10.5)

## 2017-09-09 LAB — BASIC METABOLIC PANEL
ANION GAP: 15 (ref 5–15)
BUN: 64 mg/dL — ABNORMAL HIGH (ref 6–20)
CHLORIDE: 95 mmol/L — AB (ref 101–111)
CO2: 30 mmol/L (ref 22–32)
CREATININE: 1.15 mg/dL — AB (ref 0.44–1.00)
Calcium: 8.8 mg/dL — ABNORMAL LOW (ref 8.9–10.3)
GFR calc non Af Amer: 42 mL/min — ABNORMAL LOW (ref >60)
GFR, EST AFRICAN AMERICAN: 48 mL/min — AB (ref >60)
Glucose, Bld: 161 mg/dL — ABNORMAL HIGH (ref 65–99)
POTASSIUM: 3.6 mmol/L (ref 3.5–5.1)
SODIUM: 140 mmol/L (ref 135–145)

## 2017-09-09 LAB — GLUCOSE, CAPILLARY
GLUCOSE-CAPILLARY: 212 mg/dL — AB (ref 65–99)
Glucose-Capillary: 165 mg/dL — ABNORMAL HIGH (ref 65–99)

## 2017-09-09 LAB — BRAIN NATRIURETIC PEPTIDE: B NATRIURETIC PEPTIDE 5: 80 pg/mL (ref 0.0–100.0)

## 2017-09-09 MED ORDER — ADULT MULTIVITAMIN W/MINERALS CH
1.0000 | ORAL_TABLET | Freq: Every day | ORAL | Status: DC
  Administered 2017-09-09 – 2017-09-11 (×3): 1 via ORAL
  Filled 2017-09-09 (×4): qty 1

## 2017-09-09 MED ORDER — HYDROCODONE-ACETAMINOPHEN 5-325 MG PO TABS
1.0000 | ORAL_TABLET | Freq: Four times a day (QID) | ORAL | Status: DC | PRN
  Administered 2017-09-09 – 2017-09-11 (×2): 1 via ORAL
  Filled 2017-09-09 (×2): qty 1

## 2017-09-09 NOTE — Progress Notes (Signed)
[facility] Progress Note     Subjective: CXR stable. Patient says she feels poorly in general. Has sores in her mouth. Still on HFNC. Would want a chest tube if needed it, and understands that I cannot predict how long it would stay in and that it could cause damage due to her lung scarring/ infection/ inflammation.   Objective: Vital signs in last 24 hours: Temp:  [98.2 F (36.8 C)] 98.2 F (36.8 C) (02/28 0526) Pulse Rate:  [70-74] 70 (02/28 0526) Resp:  [18-20] 18 (02/28 0526) BP: (110-120)/(64-71) 120/64 (02/28 0526) SpO2:  [92 %-97 %] 92 % (02/28 0758) Weight:  [194 lb 0.1 oz (88 kg)] 194 lb 0.1 oz (88 kg) (02/28 0359) Last BM Date: 09/08/17  Intake/Output from previous day: 02/27 0701 - 02/28 0700 In: -  Out: 1500 [Urine:1500] Intake/Output this shift: Total I/O In: 120 [P.O.:120] Out: -   General appearance: alert, cooperative and no distress Resp: no increased work of breathing; crepitus about the same bilaterally on chest  Lab Results:  Recent Labs    09/08/17 1259 09/09/17 0528  WBC 17.5* 17.4*  HGB 15.3* 15.6*  HCT 46.9* 47.2*  PLT 242 250   BMET Recent Labs    09/08/17 1259 09/09/17 0528  NA 138 140  K 3.2* 3.6  CL 94* 95*  CO2 30 30  GLUCOSE 137* 161*  BUN 55* 64*  CREATININE 0.97 1.15*  CALCIUM 8.6* 8.8*   PT/INR No results for input(s): LABPROT, INR in the last 72 hours.  Studies/Results: Ct Soft Tissue Neck W Contrast  Result Date: 09/08/2017 CLINICAL DATA:  Follow-up examination for pneumothorax. Patient with known right-sided pneumothorax. EXAM: CT NECK WITH CONTRAST TECHNIQUE: Multidetector CT imaging of the neck was performed using the standard protocol following the bolus administration of intravenous contrast. CONTRAST:  70mL ISOVUE-300 IOPAMIDOL (ISOVUE-300) INJECTION 61% COMPARISON:  Prior radiograph from 08/25/2017. FINDINGS: Pharynx and larynx: Oral cavity within normal limits without mass lesion or loculated  fluid collection. Extensive streak artifact from dental amalgam. No acute inflammatory changes about the dentition. Oropharynx within normal limits. Parapharyngeal fat preserved. Nasopharynx normal. No retropharyngeal collection or edema. Few small subcentimeter nodular densities seen along the anterior margin of the epiglottis, indeterminate, but could reflect small polyps (series 12, image 40). Remainder of the hypopharynx and supraglottic larynx without acute abnormality. True cords symmetric and normal. Subglottic airway tortuous but widely patent. Salivary glands: Parotid and submandibular glands within normal limits. Tiny calcification at the inferior aspect of the right submandibular gland favored to be vascular in nature. Thyroid: Thyroid within normal limits. Lymph nodes: No adenopathy within the neck. Vascular: Normal intravascular enhancement seen throughout the neck. Atherosclerosis noted. Limited intracranial: Unremarkable. Visualized orbits: Visualized globes and orbital soft tissues within normal limits. Patient status post cataract extraction bilaterally. Mastoids and visualized paranasal sinuses: Chronic sphenoid sinusitis noted. Visualized paranasal sinuses are otherwise largely clear. Visualized mastoids and middle ear cavities are clear. Skeleton: No acute osseous abnormality. No worrisome lytic or blastic osseous lesions. Patient status post cervical fusion at C3 through C7. Multilevel facet arthropathy noted. Prominent degenerative thickening noted at the tectorial membrane. Upper chest: Pneumomediastinum noted within the visualized upper mediastinum. Right-sided pneumothorax, incompletely visualized. Associated soft tissue emphysema within the bilateral anterolateral chest wall. Diffuse ground-glass opacity with interlobular septal thickening within the visualized lungs, suggesting pulmonary edema/CHF. Superimposed infection not excluded. Other: Gas lucency present within the right carotid  space, extending inferiorly from known right apical pneumothorax. Associated soft tissue  emphysema within the visualized anterolateral chest wall bilaterally. IMPRESSION: 1. Right-sided pneumothorax, incompletely visualized. Associated soft tissue emphysema within the anterolateral chest wall bilaterally, extending superiorly within the carotid space of the right neck. Etiology unclear. 2. Extensive ground-glass opacity with interlobular septal thickening within the visualized lungs, suggesting pulmonary edema/CHF. Findings better evaluated on concomitant CT of the chest. Superimposed infection would be difficult to exclude. 3. No other acute abnormality within the neck. 4. Few subcentimeter nodular densities along the anterior margin of the epiglottis as above, indeterminate, but could reflect a few small polyps. Correlation with physical exam/direct visualization recommended. 5. Chronic sphenoid sinusitis. Electronically Signed   By: Rise MuBenjamin  McClintock M.D.   On: 09/08/2017 18:18   Ct Chest W Contrast  Result Date: 09/08/2017 CLINICAL DATA:  Shortness of breath with cough for 2 or 3 weeks. History of hypertension, reflux and chronic kidney disease. Abnormal chest x-ray today demonstrating subcutaneous emphysema, small right pneumothorax and pneumomediastinum. EXAM: CT CHEST WITH CONTRAST TECHNIQUE: Multidetector CT imaging of the chest was performed during intravenous contrast administration. CONTRAST:  70mL ISOVUE-300 IOPAMIDOL (ISOVUE-300) INJECTION 61% COMPARISON:  Radiographs 09/08/2017 and 09/04/2017. CT 08/13/2017 and 08/30/2017. FINDINGS: Cardiovascular: Diffuse atherosclerosis of the aorta, great vessels and coronary arteries. There is stable mild ectasia of the ascending aorta. The pulmonary arteries are normally opacified. Stable mild cardiomegaly. No pericardial effusion. Mediastinum/Nodes: There are no enlarged mediastinal, hilar or axillary lymph nodes.There is diffuse pneumomediastinum. There  is no evidence mediastinal fluid collection, focal inflammation or esophageal abnormality. The trachea and thyroid gland appear unremarkable. Lungs/Pleura: Small biapical pneumothoraces. No significant pleural effusion. There are diffuse ground-glass and airspace opacities in both lungs which have mildly improved over the last 10 days. Areas consolidation remain in the right middle lobe. There is associated architectural distortion and mild bronchiectasis. No suspicious pulmonary nodule. Upper abdomen: The visualized upper abdomen has a stable appearance without acute findings. There is a small hepatic cyst. No abnormal air collections are seen in the upper abdomen. Musculoskeletal/Chest wall: There is no chest wall mass or suspicious osseous finding. There are degenerative changes throughout the thoracic spine status post cervical fusion. There is soft tissue emphysema in the lower neck and upper chest bilaterally. No focal fluid collection. Neck findings dictated separately. IMPRESSION: 1. CT confirms the presence of extensive soft tissue emphysema in the lower neck and chest with pneumomediastinum and small biapical pneumothoraces. 2. No mediastinal fluid collection or focal esophageal abnormality identified. 3. The diffuse ground-glass and airspace opacities throughout both lungs have mildly improved over the last 10 days. 4.  Aortic Atherosclerosis (ICD10-I70.0). 5. Neck findings are dictated separately. 6. These results were called by telephone at the time of interpretation on 09/08/2017 at 6:12 pm to Dr. Algis GreenhouseLINDSAY Deigo Alonso , who verbally acknowledged these results. Electronically Signed   By: Carey BullocksWilliam  Veazey M.D.   On: 09/08/2017 18:12   Dg Chest Port 1 View  Result Date: 09/09/2017 CLINICAL DATA:  Right apical pneumothorax. EXAM: PORTABLE CHEST 1 VIEW COMPARISON:  CT 09/08/2017.  Chest x-ray 09/08/2017, 09/04/2017. FINDINGS: Persistent pneumomediastinum and small apical pneumothoraces again noted. Persistent  bilateral chest wall subcutaneous emphysema. Heart size stable. Stable aortic ectasia. Stable diffuse interstitial changes are again noted. No pleural effusion. No acute bony abnormality identified. Prior cervical spine fusion. Degenerative changes and scoliosis thoracolumbar spine. IMPRESSION: 1. Stable pneumomediastinum and tiny apical pneumothoraces. No evidence of progression. 2.  Stable diffuse bilateral interstitial prominence. Electronically Signed   By: Maisie Fushomas  Register   On: 09/09/2017  07:34   Dg Chest Port 1 View  Result Date: 09/08/2017 CLINICAL DATA:  Hypoxia.  Progressive cough. EXAM: PORTABLE CHEST 1 VIEW COMPARISON:  Chest x-rays dated 09/04/2017 and 08/31/2017 and chest CT dated 08/30/2017 FINDINGS: Since the prior exam the patient has developed bilateral subcutaneous emphysema in the axillary regions as well as a tiny right apical pneumothorax. The diffuse bilateral pulmonary infiltrates have improved. Heart size and vascularity are normal. No appreciable effusion. IMPRESSION: 1. New small right apical pneumothorax. 2. New bilateral axillary subcutaneous emphysema. This indicates that the patient has bilateral air leaks. 3. Improved bilateral pulmonary infiltrates. Critical Value/emergent results were called by telephone at the time of interpretation on 09/08/2017 at 4:24 pm to Chales Abrahams, RN , who verbally acknowledged these results. Electronically Signed   By: Francene Boyers M.D.   On: 09/08/2017 16:26    Anti-infectives: Anti-infectives (From admission, onward)   Start     Dose/Rate Route Frequency Ordered Stop   09/08/17 2200  valACYclovir (VALTREX) tablet 1,000 mg    Comments:  Please do not renally adjust   1,000 mg Oral 2 times daily 09/08/17 1751 09/09/17 0845   09/06/17 1300  fluconazole (DIFLUCAN) tablet 100 mg     100 mg Oral Daily 09/06/17 1257 09/08/17 0909   08/21/2017 1700  meropenem (MERREM) 1 g in sodium chloride 0.9 % 100 mL IVPB  Status:  Discontinued     1 g 200 mL/hr  over 30 Minutes Intravenous Every 12 hours 08/20/2017 1553 09/06/17 1016   08/13/2017 1130  cefTRIAXone (ROCEPHIN) 1 g in sodium chloride 0.9 % 100 mL IVPB     1 g 200 mL/hr over 30 Minutes Intravenous  Once 09/08/2017 1118 08/19/2017 1222   08/17/2017 1130  azithromycin (ZITHROMAX) 500 mg in sodium chloride 0.9 % 250 mL IVPB     500 mg 250 mL/hr over 60 Minutes Intravenous  Once 09/09/2017 1118 08/18/2017 1326      Assessment/Plan: Ms. Tucciarone is a 84 yo DNR who has been admitted with PNA/ CHF and was found to have apical PTX on the right but on CT was found to have extensive pneumomediastinum, subcutaneous air, and bilateral apical ptx that were small.  -CXR stable this AM -No Chest tube indicated at this time -Patient discussed with Dr. Tyrone Sage last night and he felt that the imaging reflected the patient's lung disease and that she had ruptured blebs, and that the pneumomediastinum came from this as well as the small apical pneumothoraces and subcutaneous air, due to the lungs being stiffened and stuck to the parietal pleural  -CXR this afternoon at 1600 -Following    LOS: 11 days    [name] 09/09/2017

## 2017-09-09 NOTE — Care Management Note (Signed)
Case Management Note  Patient Details  Name: Lisa GamblerDorothy W Bascom MRN: [id number] Date of Birth: 07/07/1928   If discussed at Long Length of Stay Meetings, dates discussed:  09/09/2017  Additional Comments:  Jameel Quant, Chrystine OilerSharley Diane, RN 09/09/2017, 11:59 AM

## 2017-09-09 NOTE — Progress Notes (Signed)
PROGRESS NOTE  [name] ZOX:[id number] DOB: [birth year] DOA: 2017-09-21 PCP: Lenell Antu, DO  Interim summary and HPI 84 year old female with history significant for CKD stage III, GERD, hypertension, paroxysmal atrial fibrillation on Xarelto, and peripheral vascular disease who presented to the emergency with worsening cough and shortness of breath over the last 2-3 weeks. She has been diagnosed with sepsis secondary to multilobular pneumonia and was initially thought to have some pneumomediastinum, but this appears to have resolved on repeat CT chest with gastrografin on 2/18. Case complicated with acute on chronic CHF.  The patient was started on intravenous furosemide with good clinical effect.  Her respiratory status improved, and she was moved to the medical floor.  Unfortunately, she remained hypoxemic requiring high flow nasal cannula.  Repeat chest x-ray showed right apical pneumothorax.  CT of the chest was obtained and it showed pneumomediastinum and small bilateral pneumothoraces.  Neurosurgery was consulted.  Assessment/Plan: acute hypoxemic resp failure: in the setting of multifocal PNA, RAD and now acute on chronic diastolic CHF  -Completedabx's on 2/25. -Secondary to pneumothorax, pneumomediastinum, lung scarring -still requiring HFNC -Continue duonebs, continue Pulmicort, and steroids for acute RAD and potential underlying non-diagnosed COPD  -Repeat chest x-ray--personally reviewed small right apical pneumothorax -restart IV solumedrol 40 mg bid -continue Daily weights, strict intake and output -continue PO lasix. -appreciate Palliative care consult; will follow on further rec's for advance planning and GOC clarification. Patient now full DNR -After discussing with family members will treat what is treatable and if possible avoid heroic interventions.  -continue flutter valve, increase activity and continue weaning oxygen as tolerated  Lobar  pneumonia -finished 7 days merrem on 2/25  Pneumothorax and pneumomediastinum -09/08/2017 CT chest--soft tissue emphysema in the lower neck and chest with pneumomediastinum and small biapical pneumothoraces. -Long discussion with patient and family--a bit hesitant presently, but agreeable to chest tube if necessary -case discussed with Dr. Henreitta Leber who discussed with TCTS, Dr. Mar Daring likely from a bleb that has ruptured and her lungs are so poorly compliant that they are sticking to the pleural; low suspicion for any esophageal leak or bronchial tree leak, suggests watching and monitoring  Atrial fibrillation -Continue Lopressor and amio -CHADsVASC score 4 -continue xarelto  acute on chronic diastolic HF -continue laxis PO for maintenance -clinically euvolemic on exam -BNP 626>>>80 -Patient reporting good urine output and currently without any signs offluid overload on exam. -Echo demonstrated preserved ejection fraction and grade 1 diastolic dysfunction. -09/01/2017 echo EF 60-65%, grade 1 DD, mild TR, PASP 30  elevated troponin -in the setting of demand ischemia  -echo reassuring with preserved ejection fraction and w/o wall motion abnormalities  -No acute ischemic changes appreciated on telemetry or EKG. -Patient remains chest pain-free.  hypokalemia -repeat as needed  GERD -continue PPI  CKD stage 3 -Stable and at baseline -baseline creatinine 0.8-1.0 -Follow creatinine trend intermittently  -Patient reporting good urine output.  physical deconditioning: -Physical therapy has recommended skilled nursing facility at discharge  HSV gingivostomatitis -valtrex 1 gram bid x 2 doses  Dysphagia -Dys 2 diet with thin liquids  Code Status:DNR Family Communication:daughter at bedside updated 2/28--Total time spent 35 minutes.  Greater than 50% spent face to face counseling and coordinating care.  Disposition Plan:SNF when stable DVT Prophylaxis:   Xarelto  Consultants:  Palliative Care  Procedures:  2-D echo -preserved EF -grade 1 diastolic HF -no wall motion abnormalities   Antibiotics:  Ceftriaxone and  Zithromax 2/17>>2/17  Meropenem 2/17>>2/25         Subjective: Patient states that her breathing is about the same but not worse.  He has shortness of breath with minimal exertion.  She denies any chest pain, nausea, vomiting, diarrhea, abdominal pain.  She denies any fevers, chills, dysuria.  Objective: Vitals:   09/09/17 0526 09/09/17 0753 09/09/17 0758 09/09/17 1311  BP: 120/64   110/66  Pulse: 70   94  Resp: 18   18  Temp: 98.2 F (36.8 C)   98.1 F (36.7 C)  TempSrc: Oral     SpO2: 93% 93% 92% 95%  Weight:      Height:        Intake/Output Summary (Last 24 hours) at 09/09/2017 1323 Last data filed at 09/09/2017 1256 Gross per 24 hour  Intake 240 ml  Output 1500 ml  Net -1260 ml   Weight change: -5.4 kg (-14.5 oz) Exam:   General:  Pt is alert, follows commands appropriately, not in acute distress  HEENT: No icterus, No thrush, No neck mass, [place]/AT  Cardiovascular: RRR, S1/S2, no rubs, no gallops  Respiratory: Bilateral crackles but no wheezing.  Good air movement.  Abdomen: Soft/+BS, non tender, non distended, no guarding  Extremities: No edema, No lymphangitis, No petechiae, No rashes, no synovitis   Data Reviewed: I have personally reviewed following labs and imaging studies Basic Metabolic Panel: Recent Labs  Lab 09/03/17 0435 09/04/17 0448 09/06/17 0547 09/08/17 1259 09/09/17 0528  NA 141 140 139 138 140  K 3.8 3.3* 3.5 3.2* 3.6  CL 102 98* 97* 94* 95*  CO2 29 31 32 30 30  GLUCOSE 152* 125* 147* 137* 161*  BUN 47* 45* 44* 55* 64*  CREATININE 0.85 0.90 0.78 0.97 1.15*  CALCIUM 8.6* 8.5* 8.2* 8.6* 8.8*   Liver Function Tests: No results for input(s): AST, ALT, ALKPHOS, BILITOT, PROT, ALBUMIN in the last 168 hours. No results for input(s): LIPASE, AMYLASE in the  last 168 hours. No results for input(s): AMMONIA in the last 168 hours. Coagulation Profile: No results for input(s): INR, PROTIME in the last 168 hours. CBC: Recent Labs  Lab 09/02/17 2336 09/06/17 0547 09/08/17 1259 09/09/17 0528  WBC 12.2* 12.5* 17.5* 17.4*  HGB 12.4 13.4 15.3* 15.6*  HCT 37.9 41.9 46.9* 47.2*  MCV 89.8 91.1 90.0 88.4  PLT 177 193 242 250   Cardiac Enzymes: No results for input(s): CKTOTAL, CKMB, CKMBINDEX, TROPONINI in the last 168 hours. BNP: Invalid input(s): POCBNP CBG: Recent Labs  Lab 09/06/17 0802 09/07/17 0757 09/08/17 0643 09/09/17 0730 09/09/17 1110  GLUCAP 144* 152* 140* 165* 212*   HbA1C: No results for input(s): HGBA1C in the last 72 hours. Urine analysis:    Component Value Date/Time   COLORURINE YELLOW 08/30/2017 1451   APPEARANCEUR HAZY (A) 08/23/2017 1451   LABSPEC 1.031 (H) 08/15/2017 1451   PHURINE 5.0 08/27/2017 1451   GLUCOSEU NEGATIVE 08/30/2017 1451   HGBUR NEGATIVE 08/30/2017 1451   BILIRUBINUR NEGATIVE 09/06/2017 1451   KETONESUR 5 (A) 08/13/2017 1451   PROTEINUR 30 (A) 08/26/2017 1451   UROBILINOGEN 0.2 10/25/2012 1303   NITRITE NEGATIVE 09/02/2017 1451   LEUKOCYTESUR NEGATIVE 08/21/2017 1451   Sepsis Labs: @LABRCNTIP (procalcitonin:4,lacticidven:4) ) Recent Results (from the past 240 hour(s))  MRSA PCR Screening     Status: None   Collection Time: 08/30/17  7:00 PM  Result Value Ref Range Status   MRSA by PCR NEGATIVE NEGATIVE Final    Comment:  The GeneXpert MRSA Assay (FDA approved for NASAL specimens only), is one component of a comprehensive MRSA colonization surveillance program. It is not intended to diagnose MRSA infection nor to guide or monitor treatment for MRSA infections. Performed at [facility], [address] [place], [place] [address]   Culture, expectorated sputum-assessment     Status: None   Collection Time: 08/31/17  4:50 AM  Result Value Ref Range Status   Specimen  Description EXPECTORATED SPUTUM  Final   Special Requests NONE  Final   Sputum evaluation   Final    THIS SPECIMEN IS ACCEPTABLE FOR SPUTUM CULTURE Performed at [facility] Performed at [facility], [address], [place], [place] [address]    Report Status 08/31/2017 FINAL  Final  Culture, respiratory (NON-Expectorated)     Status: None   Collection Time: 08/31/17  4:50 AM  Result Value Ref Range Status   Specimen Description   Final    EXPECTORATED SPUTUM Performed at [facility], [address], [place], [place] [address]    Special Requests   Final    NONE Reflexed from [id number] Performed at [facility], [address] [place], [place] [address]    Gram Stain   Final    MODERATE WBC PRESENT,BOTH PMN AND MONONUCLEAR FEW GRAM VARIABLE ROD RARE GRAM POSITIVE COCCI RARE YEAST ABUNDANT SQUAMOUS EPITHELIAL CELLS PRESENT    Culture   Final    Consistent with normal respiratory flora. Performed at [facility], [address] [place], [place] [address]    Report Status 09/03/2017 FINAL  Final  MRSA PCR Screening     Status: None   Collection Time: 09/01/17  5:19 PM  Result Value Ref Range Status   MRSA by PCR NEGATIVE NEGATIVE Final    Comment:        The GeneXpert MRSA Assay (FDA approved for NASAL specimens only), is one component of a comprehensive MRSA colonization surveillance program. It is not intended to diagnose MRSA infection nor to guide or monitor treatment for MRSA infections. Performed at [facility], [address] [place], [place] [address]      Scheduled Meds: . amiodarone  200 mg Oral Daily  . bisacodyl  10 mg Rectal Daily  . budesonide (PULMICORT) nebulizer solution  0.5 mg Nebulization BID  . feeding supplement (ENSURE ENLIVE)  237 mL Oral BID BM  . fluticasone  1 spray Each Nare Daily  . furosemide  40 mg Oral Daily  . ipratropium-albuterol  3 mL Nebulization TID  . magic mouthwash  2.5 mL Oral QID   And  . lidocaine  2.5 mL  Mouth/Throat QID  . methylPREDNISolone (SOLU-MEDROL) injection  40 mg Intravenous Q12H  . metoprolol tartrate  12.5 mg Oral BID  . multivitamin with minerals  1 tablet Oral Daily  . pantoprazole  40 mg Oral Daily  . rivaroxaban  15 mg Oral Q supper  . rOPINIRole  0.5 mg Oral QHS   Continuous Infusions:  Procedures/Studies: Ct Soft Tissue Neck W Contrast  Result Date: 09/08/2017 CLINICAL DATA:  Follow-up examination for pneumothorax. Patient with known right-sided pneumothorax. EXAM: CT NECK WITH CONTRAST TECHNIQUE: Multidetector CT imaging of the neck was performed using the standard protocol following the bolus administration of intravenous contrast. CONTRAST:  70mL ISOVUE-300 IOPAMIDOL (ISOVUE-300) INJECTION 61% COMPARISON:  Prior radiograph from 08/25/2017. FINDINGS: Pharynx and larynx: Oral cavity within normal limits without mass lesion or loculated fluid collection. Extensive streak artifact from dental amalgam. No acute  inflammatory changes about the dentition. Oropharynx within normal limits. Parapharyngeal fat preserved. Nasopharynx normal. No retropharyngeal collection or edema. Few small subcentimeter nodular densities seen along the anterior margin of the epiglottis, indeterminate, but could reflect small polyps (series 12, image 40). Remainder of the hypopharynx and supraglottic larynx without acute abnormality. True cords symmetric and normal. Subglottic airway tortuous but widely patent. Salivary glands: Parotid and submandibular glands within normal limits. Tiny calcification at the inferior aspect of the right submandibular gland favored to be vascular in nature. Thyroid: Thyroid within normal limits. Lymph nodes: No adenopathy within the neck. Vascular: Normal intravascular enhancement seen throughout the neck. Atherosclerosis noted. Limited intracranial: Unremarkable. Visualized orbits: Visualized globes and orbital soft tissues within normal limits. Patient status post cataract  extraction bilaterally. Mastoids and visualized paranasal sinuses: Chronic sphenoid sinusitis noted. Visualized paranasal sinuses are otherwise largely clear. Visualized mastoids and middle ear cavities are clear. Skeleton: No acute osseous abnormality. No worrisome lytic or blastic osseous lesions. Patient status post cervical fusion at C3 through C7. Multilevel facet arthropathy noted. Prominent degenerative thickening noted at the tectorial membrane. Upper chest: Pneumomediastinum noted within the visualized upper mediastinum. Right-sided pneumothorax, incompletely visualized. Associated soft tissue emphysema within the bilateral anterolateral chest wall. Diffuse ground-glass opacity with interlobular septal thickening within the visualized lungs, suggesting pulmonary edema/CHF. Superimposed infection not excluded. Other: Gas lucency present within the right carotid space, extending inferiorly from known right apical pneumothorax. Associated soft tissue emphysema within the visualized anterolateral chest wall bilaterally. IMPRESSION: 1. Right-sided pneumothorax, incompletely visualized. Associated soft tissue emphysema within the anterolateral chest wall bilaterally, extending superiorly within the carotid space of the right neck. Etiology unclear. 2. Extensive ground-glass opacity with interlobular septal thickening within the visualized lungs, suggesting pulmonary edema/CHF. Findings better evaluated on concomitant CT of the chest. Superimposed infection would be difficult to exclude. 3. No other acute abnormality within the neck. 4. Few subcentimeter nodular densities along the anterior margin of the epiglottis as above, indeterminate, but could reflect a few small polyps. Correlation with physical exam/direct visualization recommended. 5. Chronic sphenoid sinusitis. Electronically Signed   By: Rise Mu M.D.   On: 09/08/2017 18:18   Ct Chest Wo Contrast  Result Date: 08/30/2017 CLINICAL DATA:   84 year old female with question esophageal injury with possible pneumomediastinum identified on 08/16/2017 CT. Patient given oral contrast evaluate for extraluminal contrast/esophageal injury. History of neck surgery in 05/2017. Also with cough and lethargy. EXAM: CT CHEST WITHOUT CONTRAST TECHNIQUE: Multidetector CT imaging of the chest was performed following the standard protocol without IV contrast. COMPARISON:  08/27/2017 chest CT and prior studies FINDINGS: Cardiovascular: Cardiomegaly and coronary artery/thoracic aortic atherosclerotic calcifications again noted. Ectatic ascending thoracic aorta measuring 3.8 cm again noted. No pericardial effusion. Mediastinum/Nodes: Oral contrast within the mid and distal esophagus and proximal stomach identified. There is no evidence of extraluminal contrast or mediastinal air on today's study. Shotty mediastinal and bilateral hilar nodes are again noted. Lungs/Pleura: Diffuse ground-glass, interstitial and airspace opacities within both lungs are again noted appear slightly increased. Small bilateral pleural effusions are again noted, left-greater-than-right. There is no evidence of pneumothorax. Upper Abdomen: No acute abnormality. A small hiatal hernia is noted. Musculoskeletal: No acute or suspicious abnormalities. IMPRESSION: 1. No evidence of extraluminal oral contrast or mediastinal air on today's study. 2. Diffuse ground-glass, interstitial and airspace opacities bilaterally which appear slightly increased. This is nonspecific but may represent diffuse edema and/or infection. 3. Small bilateral pleural effusions, cardiomegaly and small hiatal hernia again noted. 4.  Ectatic ascending thoracic aorta again noted measuring 3.8 cm. 5. Coronary artery and aortic Atherosclerosis (ICD10-I70.0). Electronically Signed   By: Harmon Pier M.D.   On: 08/30/2017 10:38   Ct Chest W Contrast  Result Date: 09/08/2017 CLINICAL DATA:  Shortness of breath with cough for 2 or 3  weeks. History of hypertension, reflux and chronic kidney disease. Abnormal chest x-ray today demonstrating subcutaneous emphysema, small right pneumothorax and pneumomediastinum. EXAM: CT CHEST WITH CONTRAST TECHNIQUE: Multidetector CT imaging of the chest was performed during intravenous contrast administration. CONTRAST:  70mL ISOVUE-300 IOPAMIDOL (ISOVUE-300) INJECTION 61% COMPARISON:  Radiographs 09/08/2017 and 09/04/2017. CT 08/30/2017 and 08/30/2017. FINDINGS: Cardiovascular: Diffuse atherosclerosis of the aorta, great vessels and coronary arteries. There is stable mild ectasia of the ascending aorta. The pulmonary arteries are normally opacified. Stable mild cardiomegaly. No pericardial effusion. Mediastinum/Nodes: There are no enlarged mediastinal, hilar or axillary lymph nodes.There is diffuse pneumomediastinum. There is no evidence mediastinal fluid collection, focal inflammation or esophageal abnormality. The trachea and thyroid gland appear unremarkable. Lungs/Pleura: Small biapical pneumothoraces. No significant pleural effusion. There are diffuse ground-glass and airspace opacities in both lungs which have mildly improved over the last 10 days. Areas consolidation remain in the right middle lobe. There is associated architectural distortion and mild bronchiectasis. No suspicious pulmonary nodule. Upper abdomen: The visualized upper abdomen has a stable appearance without acute findings. There is a small hepatic cyst. No abnormal air collections are seen in the upper abdomen. Musculoskeletal/Chest wall: There is no chest wall mass or suspicious osseous finding. There are degenerative changes throughout the thoracic spine status post cervical fusion. There is soft tissue emphysema in the lower neck and upper chest bilaterally. No focal fluid collection. Neck findings dictated separately. IMPRESSION: 1. CT confirms the presence of extensive soft tissue emphysema in the lower neck and chest with  pneumomediastinum and small biapical pneumothoraces. 2. No mediastinal fluid collection or focal esophageal abnormality identified. 3. The diffuse ground-glass and airspace opacities throughout both lungs have mildly improved over the last 10 days. 4.  Aortic Atherosclerosis (ICD10-I70.0). 5. Neck findings are dictated separately. 6. These results were called by telephone at the time of interpretation on 09/08/2017 at 6:12 pm to Dr. Algis Greenhouse , who verbally acknowledged these results. Electronically Signed   By: Carey Bullocks M.D.   On: 09/08/2017 18:12   Ct Angio Chest Pe W Or Wo Contrast  Result Date: 08/13/2017 CLINICAL DATA:  Bronchitis 2-3 weeks with completion of antibiotics and continued cough and lethargy. EXAM: CT ANGIOGRAPHY CHEST WITH CONTRAST TECHNIQUE: Multidetector CT imaging of the chest was performed using the standard protocol during bolus administration of intravenous contrast. Multiplanar CT image reconstructions and MIPs were obtained to evaluate the vascular anatomy. CONTRAST:  80mL ISOVUE-370 IOPAMIDOL (ISOVUE-370) INJECTION 76% COMPARISON:  Chest x-ray 08/15/2017 and 01/25/2017 as well as abdominal CT 03/12/2008 FINDINGS: Cardiovascular: Borderline cardiomegaly. Minimal calcified plaque over the left anterior descending and lateral circumflex coronary arteries. Calcified plaque over the thoracic aorta. There is mild ectasia of the ascending thoracic aorta measuring 3.8 cm in AP diameter. No evidence of dissection. Pulmonary arterial system is well opacified without evidence of emboli. Mediastinum/Nodes: No evidence of mediastinal or hilar adenopathy. Suggestion of a small hiatal hernia. Small collection of air adjacent the left inferior heart border and few tiny foci of air adjacent the distal esophagus and gastroesophageal junction likely minimal pneumomediastinum. Lungs/Pleura: Lungs are somewhat hypoinflated demonstrate heterogeneous bilateral mixed interstitial airspace process.  Small bilateral pleural effusions left greater  than right. No cavitary lesion identified. Several air bronchograms. Airways are otherwise within normal. Upper Abdomen: 1.5 cm cyst over the anterior segment right lobe of the liver. Musculoskeletal: Degenerative change of the spine. Review of the MIP images confirms the above findings. IMPRESSION: No evidence of pulmonary embolism. Bilateral patchy heterogeneous mixed interstitial airspace process with small bilateral pleural effusions with left greater than right. Findings likely due to diffuse multifocal pneumonia. Findings suggesting minimal pneumomediastinum as this may be secondary to esophageal injury given a few small foci of air adjacent the esophagus. Mild cardiomegaly.  Mild atherosclerotic coronary artery disease. Ectasia of the ascending thoracic aorta measuring 3.8 cm in AP diameter. Recommend annual imaging followup by CTA or MRA. This recommendation follows 2010 ACCF/AHA/AATS/ACR/ASA/SCA/SCAI/SIR/STS/SVM Guidelines for the Diagnosis and Management of Patients with Thoracic Aortic Disease. Circulation.2010; 121: Z610-R604. 1.5 cm liver cyst. Aortic Atherosclerosis (ICD10-I70.0). Critical Value/emergent results were called by telephone at the time of interpretation on 2017/09/23 at 2:33 pm to Dr. Effie Shy, who verbally acknowledged these results. Electronically Signed   By: Elberta Fortis M.D.   On: 09/23/17 14:33   Dg Chest Port 1 View  Result Date: 09/09/2017 CLINICAL DATA:  Right apical pneumothorax. EXAM: PORTABLE CHEST 1 VIEW COMPARISON:  CT 09/08/2017.  Chest x-ray 09/08/2017, 09/04/2017. FINDINGS: Persistent pneumomediastinum and small apical pneumothoraces again noted. Persistent bilateral chest wall subcutaneous emphysema. Heart size stable. Stable aortic ectasia. Stable diffuse interstitial changes are again noted. No pleural effusion. No acute bony abnormality identified. Prior cervical spine fusion. Degenerative changes and scoliosis  thoracolumbar spine. IMPRESSION: 1. Stable pneumomediastinum and tiny apical pneumothoraces. No evidence of progression. 2.  Stable diffuse bilateral interstitial prominence. Electronically Signed   By: Maisie Fus  Register   On: 09/09/2017 07:34   Dg Chest Port 1 View  Result Date: 09/08/2017 CLINICAL DATA:  Hypoxia.  Progressive cough. EXAM: PORTABLE CHEST 1 VIEW COMPARISON:  Chest x-rays dated 09/04/2017 and 08/31/2017 and chest CT dated 08/30/2017 FINDINGS: Since the prior exam the patient has developed bilateral subcutaneous emphysema in the axillary regions as well as a tiny right apical pneumothorax. The diffuse bilateral pulmonary infiltrates have improved. Heart size and vascularity are normal. No appreciable effusion. IMPRESSION: 1. New small right apical pneumothorax. 2. New bilateral axillary subcutaneous emphysema. This indicates that the patient has bilateral air leaks. 3. Improved bilateral pulmonary infiltrates. Critical Value/emergent results were called by telephone at the time of interpretation on 09/08/2017 at 4:24 pm to Chales Abrahams, RN , who verbally acknowledged these results. Electronically Signed   By: Francene Boyers M.D.   On: 09/08/2017 16:26   Dg Chest Port 1 View  Result Date: 09/04/2017 CLINICAL DATA:  Shortness of breath EXAM: PORTABLE CHEST 1 VIEW COMPARISON:  August 31, 2017 FINDINGS: The cardiomediastinal silhouette is stable. Diffuse bilateral pulmonary opacities are unchanged. IMPRESSION: No interval change in diffuse bilateral pulmonary opacities. Electronically Signed   By: Gerome Sam III M.D   On: 09/04/2017 09:08   Dg Chest Port 1 View  Result Date: 08/31/2017 CLINICAL DATA:  Hypoxia, shortness of breath. Admitted with pneumonia and sepsis. EXAM: PORTABLE CHEST 1 VIEW COMPARISON:  CT chest August 30, 2017 and chest radiograph 2017-09-23 FINDINGS: Diffuse interstitial and alveolar airspace opacities, small suspected pleural effusions. Cardiac silhouette is  normal in size. Calcified aortic knob. Fullness of the RIGHT hilum. No pneumothorax. ACDF. IMPRESSION: Similar and alveolar airspace opacities concerning for pneumonia. Fullness of the RIGHT hila corresponding to vascular shadows on prior radiograph. Aortic Atherosclerosis (ICD10-I70.0).  Electronically Signed   By: Awilda Metro M.D.   On: 08/31/2017 22:40   Dg Chest Port 1 View  Result Date: 09/05/2017 CLINICAL DATA:  Shortness of breath with cough and wheezing as well as weakness. EXAM: PORTABLE CHEST 1 VIEW COMPARISON:  01/25/2017 FINDINGS: Lungs are adequately inflated and demonstrate moderate bilateral patchy mixed interstitial airspace density. Possible cavitary process in the right infrahilar region. No definite effusion. Cardiac silhouette is within normal. Fusion hardware over the cervical spine. Remainder of the exam is unchanged. IMPRESSION: Moderate patchy bilateral mixed interstitial airspace process with possible cavitary process in the right infrahilar region likely due to infection. Recommend follow-up to resolution. Electronically Signed   By: Elberta Fortis M.D.   On: 09/07/2017 10:44    Catarina Hartshorn, DO  [facility] Pager [phone]  If 7PM-7AM, please contact night-coverage [email or web address] Password [email or web address] 09/09/2017, 1:23 PM   LOS: 11 days

## 2017-09-09 NOTE — Progress Notes (Signed)
[facility]  CXR this AM and PM without change. Continue to monitor. No place for chest tube at this time.  Algis GreenhouseLindsay Amedio Bowlby, MD [facility] [address] [place], [place] [id number] [phone] (office)

## 2017-09-09 NOTE — Progress Notes (Signed)
Initial Nutrition Assessment  DOCUMENTATION CODES:   Non-severe (moderate) malnutrition in context of acute illness/injury  INTERVENTION:  Obtain food preferences each meal  Ensure Enlive po BID, each supplement provides 350 kcal and 20 grams of protein   Add Multivitamin daily  NUTRITION DIAGNOSIS:   Moderate Malnutrition related to acute illness, mouth pain, decreased appetite(pneumonia, acute chf) as evidenced by mild fat depletion, mild muscle depletion, energy intake < 75% for > 7 days.   GOAL: Pt to meet >/= 90% of their estimated nutrition needs  If feasible given pt acute and chronic disease as well as advanced age.   MONITOR:   Supplement acceptance, PO intake, Labs, Weight trends, Skin REASON FOR ASSESSMENT:   LOS    ASSESSMENT:  RD drawn back to pt due to length of stay. Hx of CKD-3, GERD, HTN, PVD. She has been assessed by ST and palliative medicine. SNF is being recommended at discharge. She was ambulating with a walker prior to admission.   Presented with shortness of breath and pneumonia. Acute on chronic CHF. They family says that patient appetite became poor around 2 weeks prior to admission. The patient was being treated for bronchitis.    Also while hospitalized her mouth has been very painful the past few days which has also contributing to decreased oral intake as well as her breathing difficulty.Talked with ST who is downgrading her diet and Magic Cups are being added with lunch and dinner.The pt is being treated with magic mouthwash. Documented and reported Meal intake 0- 50% and >50% of 1 bottle Ensure Enlive consumed daily per pt. This morning she ate a few bites of oatmeal and 25% of her applesauce for breakfast- no protein.     Her weight has ranged from 220.10 lbs down to 194 lb since being admitted. Taking lasix 40 mg daily. The patient reported baseline weight is: 170's- but according to hospital records it has been as far back as 2014. In  November  of 2018 she was weighing 183-186 lb.   NUTRITION - FOCUSED PHYSICAL EXAM:    Most Recent Value  Orbital Region  Mild depletion  Upper Arm Region  Mild depletion  Thoracic and Lumbar Region  Moderate depletion  Buccal Region  Mild depletion  Temple Region  Mild depletion  Clavicle Bone Region  Moderate depletion  Clavicle and Acromion Bone Region  Moderate depletion  Scapular Bone Region  Mild depletion  Dorsal Hand  Mild depletion  Patellar Region  No depletion  Anterior Thigh Region  No depletion  Posterior Calf Region  No depletion  Edema (RD Assessment)  None [feet and ankles]  Hair  Reviewed  Eyes  Reviewed  Mouth  Reviewed  Skin  Reviewed  Nails  Reviewed      Diet Order:  DIET DYS 2 Room service appropriate? Yes; Fluid consistency: Thin  EDUCATION NEEDS:   Education needs have been addressed Skin:  Skin Assessment: Reviewed RN Assessment  Last BM:  2/27 type 5- large  Height:   Ht Readings from Last 1 Encounters:  09/01/17 5\' 4"  (1.626 m)    Weight:   Wt Readings from Last 1 Encounters:  09/09/17 194 lb 0.1 oz (88 kg)    Ideal Body Weight:  55 kg  BMI:  Body mass index is 33.3 kg/m.  Estimated Nutritional Needs:   Kcal:  1700-1800  Protein:  80-85 gr  Fluid:  < 2000 ml daily   Royann ShiversLynn Raequan Vanschaick MS,RD,CSG,LDN Office: [phone] Pager: [phone]

## 2017-09-09 NOTE — Progress Notes (Signed)
SLP Cancellation Note  Patient Details Name: [name] MRN: [id number] DOB: 02/23/1928   Cancelled treatment:       Reason Eval/Treat Not Completed: Other (comment)   Events noted. Pt not seen for dysphagia treat today. RN reports minimal po intake today. If MD suspects aspiration component, can complete MBSS if ordered. This was presented to Pt earlier in the week, however Pt was adamant that she did "not have trouble swallowing" and wasn't interested. Pt with cervical fusion in November and did have resulting dysphagia mostly related to post op swelling. Pt was advanced to a regular diet shortly after.   Thank you,  Havery MorosDabney Porter, CCC-SLP [phone]    PORTER,DABNEY 09/09/2017, 7:12 PM

## 2017-09-10 ENCOUNTER — Inpatient Hospital Stay (HOSPITAL_COMMUNITY): Payer: Medicare Other

## 2017-09-10 DIAGNOSIS — N179 Acute kidney failure, unspecified: Secondary | ICD-10-CM

## 2017-09-10 DIAGNOSIS — N183 Chronic kidney disease, stage 3 unspecified: Secondary | ICD-10-CM

## 2017-09-10 DIAGNOSIS — J189 Pneumonia, unspecified organism: Secondary | ICD-10-CM

## 2017-09-10 DIAGNOSIS — E440 Moderate protein-calorie malnutrition: Secondary | ICD-10-CM

## 2017-09-10 LAB — CBC
HEMATOCRIT: 46.7 % — AB (ref 36.0–46.0)
HEMOGLOBIN: 15.1 g/dL — AB (ref 12.0–15.0)
MCH: 29 pg (ref 26.0–34.0)
MCHC: 32.3 g/dL (ref 30.0–36.0)
MCV: 89.8 fL (ref 78.0–100.0)
Platelets: 185 10*3/uL (ref 150–400)
RBC: 5.2 MIL/uL — ABNORMAL HIGH (ref 3.87–5.11)
RDW: 14.9 % (ref 11.5–15.5)
WBC: 13.1 10*3/uL — ABNORMAL HIGH (ref 4.0–10.5)

## 2017-09-10 LAB — BASIC METABOLIC PANEL
ANION GAP: 15 (ref 5–15)
BUN: 98 mg/dL — ABNORMAL HIGH (ref 6–20)
CO2: 29 mmol/L (ref 22–32)
Calcium: 8.6 mg/dL — ABNORMAL LOW (ref 8.9–10.3)
Chloride: 97 mmol/L — ABNORMAL LOW (ref 101–111)
Creatinine, Ser: 2.12 mg/dL — ABNORMAL HIGH (ref 0.44–1.00)
GFR calc Af Amer: 23 mL/min — ABNORMAL LOW (ref >60)
GFR, EST NON AFRICAN AMERICAN: 20 mL/min — AB (ref >60)
GLUCOSE: 187 mg/dL — AB (ref 65–99)
POTASSIUM: 3.6 mmol/L (ref 3.5–5.1)
Sodium: 141 mmol/L (ref 135–145)

## 2017-09-10 LAB — MAGNESIUM: Magnesium: 2.7 mg/dL — ABNORMAL HIGH (ref 1.7–2.4)

## 2017-09-10 LAB — GLUCOSE, CAPILLARY: Glucose-Capillary: 178 mg/dL — ABNORMAL HIGH (ref 65–99)

## 2017-09-10 NOTE — Clinical Social Work Note (Signed)
LCSW following. Pt with bilateral pneumothorax per MD. Plan of care developing. Pt not stable for dc. [facility]. Will follow up next week.

## 2017-09-10 NOTE — Progress Notes (Signed)
[facility]  CXR stable. Resting in bed. No increased work of breathing.   Will repeat Sunday.  Algis GreenhouseLindsay Grainne Knights, MD [facility] [address] [place], [place] [phone] (office)

## 2017-09-10 NOTE — Progress Notes (Signed)
PROGRESS NOTE  [name] WUX:[id number] DOB: 02-13-1928 DOA: 09/06/2017 PCP: Lenell Antu, DO  Interim summary and HPI 83 year old female with history significant for CKD stage III, GERD, hypertension, paroxysmal atrial fibrillation on Xarelto, and peripheral vascular disease who presented to the emergency with worsening cough and shortness of breath over the last 2-3 weeks. She has been diagnosed with sepsis secondary to multilobular pneumonia and was initially thought to have some pneumomediastinum, but this appears to have resolved on repeat CT chest with gastrografin on 2/18. Case complicated with acute on chronic CHF.  The patient was started on intravenous furosemide with good clinical effect.  Her respiratory status improved, and she was moved to the medical floor.  Unfortunately, she remained hypoxemic requiring high flow nasal cannula.  Repeat chest x-ray showed right apical pneumothorax.  CT of the chest was obtained and it showed pneumomediastinum and small bilateral pneumothoraces.  Neurosurgery was consulted.  Assessment/Plan: acute hypoxemic resp failure: in the setting of multifocal PNA, RAD and now acute on chronic diastolic CHF  -Completedabx's on 2/25. -Secondary to pneumothorax, pneumomediastinum, lung scarring -still requiring HFNC at 12 L -Continue duonebs, continue Pulmicort, and steroids for acute RAD and potential underlying non-diagnosed COPD  -3/1 Repeat chest x-ray--personally reviewed small right apical pneumothorax -continue IV solumedrol 40 mg bid -continue Daily weights, strict intake and output -hold PO lasix due to AKI -appreciate Palliative care consult; Patient now full DNR -After discussing with family members will treat what is treatable and if possible avoid heroic interventions.  -continue flutter valve, increase activity and continue weaning oxygen as tolerated  Lobar pneumonia -finished 7 days merrem on 2/25  Pneumothorax and  pneumomediastinum -09/08/2017 CT chest--soft tissue emphysema in the lower neck and chest with pneumomediastinum and small biapical pneumothoraces. -case discussed with Dr. Henreitta Leber who discussed with TCTS, Dr. Mar Daring likely from a bleb that has ruptured and her lungs are so poorly compliant that they are sticking to the pleural; low suspicion for any esophageal leak or bronchial tree leak, suggests watching and monitoring -3/1 Repeat chest x-ray--personally reviewed small right apical pneumothorax  AKI -due to contrast nephropathy -had CT chest 09/08/17 -am BMP -renal US  Atrial fibrillation -Continue Lopressor and amio -CHADsVASC score 4 -continue xarelto  acute on chronic diastolic HF -hold lasix due to AKI -clinically euvolemic on exam -BNP 626>>>80 -Patient reporting good urine output and currently without any signs offluid overload on exam. -Echo demonstrated preserved ejection fraction and grade 1 diastolic dysfunction. -09/01/2017 echo EF 60-65%, grade 1 DD, mild TR, PASP 30  elevated troponin -in the setting of demand ischemia  -echo reassuring with preserved ejection fraction and w/o wall motion abnormalities  -No acute ischemic changes appreciated on telemetry or EKG. -Patient remains chest pain-free.  hypokalemia -repeat as needed  GERD -continue PPI  CKD stage 3 -Stable and at baseline -baseline creatinine 0.8-1.0 -Follow creatinine trend intermittently  -Patient reporting good urine output.  physical deconditioning: -Physical therapy has recommended skilled nursing facility at discharge  HSV gingivostomatitis -valtrex 1 gram bid x 2 doses -improving  Dysphagia -Dys 2 diet with thin liquids  Code Status:DNR Family Communication:daughter at bedside updated 3/1--Total time spent 35 minutes.  Greater than 50% spent face to face counseling and coordinating care.  Disposition Plan:SNF when stable DVT Prophylaxis:   Xarelto  Consultants:  Palliative Care  Procedures:  2-D echo -preserved EF -grade 1 diastolic HF -no wall motion abnormalities  Antibiotics:  Ceftriaxone and Zithromax 2/17>>2/17  Meropenem 2/17>>2/25      Subjective: Patient feeling a little bit better today.  She states that she is breathing a little bit better today.  She denies any fevers, chills, headache, chest pain, nausea, vomiting, diarrhea, abdominal pain.  She has dyspnea with minimal exertion.  Objective: Vitals:   09/10/17 0750 09/10/17 0758 09/10/17 1418 09/10/17 1445  BP:   (!) 103/57   Pulse:   73   Resp:   16   Temp:   98.3 F (36.8 C)   TempSrc:   Oral   SpO2: 93% 93% 97% 94%  Weight:      Height:        Intake/Output Summary (Last 24 hours) at 09/10/2017 1740 Last data filed at 09/10/2017 1313 Gross per 24 hour  Intake 480 ml  Output 300 ml  Net 180 ml   Weight change: -0.8 kg (-12.2 oz) Exam:   General:  Pt is alert, follows commands appropriately, not in acute distress  HEENT: No icterus, No thrush, No neck mass, [place]/AT  Cardiovascular: RRR, S1/S2, no rubs, no gallops  Respiratory: Bilateral crackles.  No wheezing.  Good air movement.  Abdomen: Soft/+BS, non tender, non distended, no guarding  Extremities: No edema, No lymphangitis, No petechiae, No rashes, no synovitis   Data Reviewed: I have personally reviewed following labs and imaging studies Basic Metabolic Panel: Recent Labs  Lab 09/04/17 0448 09/06/17 0547 09/08/17 1259 09/09/17 0528 09/10/17 0952  NA 140 139 138 140 141  K 3.3* 3.5 3.2* 3.6 3.6  CL 98* 97* 94* 95* 97*  CO2 31 32 30 30 29   GLUCOSE 125* 147* 137* 161* 187*  BUN 45* 44* 55* 64* 98*  CREATININE 0.90 0.78 0.97 1.15* 2.12*  CALCIUM 8.5* 8.2* 8.6* 8.8* 8.6*  MG  --   --   --   --  2.7*   Liver Function Tests: No results for input(s): AST, ALT, ALKPHOS, BILITOT, PROT, ALBUMIN in the last 168 hours. No results for input(s): LIPASE, AMYLASE in  the last 168 hours. No results for input(s): AMMONIA in the last 168 hours. Coagulation Profile: No results for input(s): INR, PROTIME in the last 168 hours. CBC: Recent Labs  Lab 09/06/17 0547 09/08/17 1259 09/09/17 0528 09/10/17 0952  WBC 12.5* 17.5* 17.4* 13.1*  HGB 13.4 15.3* 15.6* 15.1*  HCT 41.9 46.9* 47.2* 46.7*  MCV 91.1 90.0 88.4 89.8  PLT 193 242 250 185   Cardiac Enzymes: No results for input(s): CKTOTAL, CKMB, CKMBINDEX, TROPONINI in the last 168 hours. BNP: Invalid input(s): POCBNP CBG: Recent Labs  Lab 09/07/17 0757 09/08/17 0643 09/09/17 0730 09/09/17 1110 09/10/17 0728  GLUCAP 152* 140* 165* 212* 178*   HbA1C: No results for input(s): HGBA1C in the last 72 hours. Urine analysis:    Component Value Date/Time   COLORURINE YELLOW Sep 24, 2017 1451   APPEARANCEUR HAZY (A) 09/24/2017 1451   LABSPEC 1.031 (H) 09-24-2017 1451   PHURINE 5.0 Sep 24, 2017 1451   GLUCOSEU NEGATIVE 2017-09-24 1451   HGBUR NEGATIVE 2017-09-24 1451   BILIRUBINUR NEGATIVE 09-24-2017 1451   KETONESUR 5 (A) 2017/09/24 1451   PROTEINUR 30 (A) 09/24/2017 1451   UROBILINOGEN 0.2 10/25/2012 1303   NITRITE NEGATIVE 09/24/17 1451   LEUKOCYTESUR NEGATIVE 24-Sep-2017 1451   Sepsis Labs: @LABRCNTIP (procalcitonin:4,lacticidven:4) ) Recent Results (from the past 240 hour(s))  MRSA PCR Screening     Status: None   Collection Time: 09/01/17  5:19 PM  Result Value Ref  Range Status   MRSA by PCR NEGATIVE NEGATIVE Final    Comment:        The GeneXpert MRSA Assay (FDA approved for NASAL specimens only), is one component of a comprehensive MRSA colonization surveillance program. It is not intended to diagnose MRSA infection nor to guide or monitor treatment for MRSA infections. Performed at [facility], [address], [place], [place] [address]      Scheduled Meds: . amiodarone  200 mg Oral Daily  . bisacodyl  10 mg Rectal Daily  . budesonide (PULMICORT) nebulizer solution  0.5  mg Nebulization BID  . feeding supplement (ENSURE ENLIVE)  237 mL Oral BID BM  . fluticasone  1 spray Each Nare Daily  . furosemide  40 mg Oral Daily  . ipratropium-albuterol  3 mL Nebulization TID  . magic mouthwash  2.5 mL Oral QID   And  . lidocaine  2.5 mL Mouth/Throat QID  . methylPREDNISolone (SOLU-MEDROL) injection  40 mg Intravenous Q12H  . metoprolol tartrate  12.5 mg Oral BID  . multivitamin with minerals  1 tablet Oral Daily  . pantoprazole  40 mg Oral Daily  . rivaroxaban  15 mg Oral Q supper  . rOPINIRole  0.5 mg Oral QHS   Continuous Infusions:  Procedures/Studies: Ct Soft Tissue Neck W Contrast  Result Date: 09/08/2017 CLINICAL DATA:  Follow-up examination for pneumothorax. Patient with known right-sided pneumothorax. EXAM: CT NECK WITH CONTRAST TECHNIQUE: Multidetector CT imaging of the neck was performed using the standard protocol following the bolus administration of intravenous contrast. CONTRAST:  70mL ISOVUE-300 IOPAMIDOL (ISOVUE-300) INJECTION 61% COMPARISON:  Prior radiograph from 08/25/2017. FINDINGS: Pharynx and larynx: Oral cavity within normal limits without mass lesion or loculated fluid collection. Extensive streak artifact from dental amalgam. No acute inflammatory changes about the dentition. Oropharynx within normal limits. Parapharyngeal fat preserved. Nasopharynx normal. No retropharyngeal collection or edema. Few small subcentimeter nodular densities seen along the anterior margin of the epiglottis, indeterminate, but could reflect small polyps (series 12, image 40). Remainder of the hypopharynx and supraglottic larynx without acute abnormality. True cords symmetric and normal. Subglottic airway tortuous but widely patent. Salivary glands: Parotid and submandibular glands within normal limits. Tiny calcification at the inferior aspect of the right submandibular gland favored to be vascular in nature. Thyroid: Thyroid within normal limits. Lymph nodes: No  adenopathy within the neck. Vascular: Normal intravascular enhancement seen throughout the neck. Atherosclerosis noted. Limited intracranial: Unremarkable. Visualized orbits: Visualized globes and orbital soft tissues within normal limits. Patient status post cataract extraction bilaterally. Mastoids and visualized paranasal sinuses: Chronic sphenoid sinusitis noted. Visualized paranasal sinuses are otherwise largely clear. Visualized mastoids and middle ear cavities are clear. Skeleton: No acute osseous abnormality. No worrisome lytic or blastic osseous lesions. Patient status post cervical fusion at C3 through C7. Multilevel facet arthropathy noted. Prominent degenerative thickening noted at the tectorial membrane. Upper chest: Pneumomediastinum noted within the visualized upper mediastinum. Right-sided pneumothorax, incompletely visualized. Associated soft tissue emphysema within the bilateral anterolateral chest wall. Diffuse ground-glass opacity with interlobular septal thickening within the visualized lungs, suggesting pulmonary edema/CHF. Superimposed infection not excluded. Other: Gas lucency present within the right carotid space, extending inferiorly from known right apical pneumothorax. Associated soft tissue emphysema within the visualized anterolateral chest wall bilaterally. IMPRESSION: 1. Right-sided pneumothorax, incompletely visualized. Associated soft tissue emphysema within the anterolateral chest wall bilaterally, extending superiorly within the carotid space of the right neck. Etiology unclear. 2. Extensive ground-glass opacity with interlobular septal thickening within the  visualized lungs, suggesting pulmonary edema/CHF. Findings better evaluated on concomitant CT of the chest. Superimposed infection would be difficult to exclude. 3. No other acute abnormality within the neck. 4. Few subcentimeter nodular densities along the anterior margin of the epiglottis as above, indeterminate, but could  reflect a few small polyps. Correlation with physical exam/direct visualization recommended. 5. Chronic sphenoid sinusitis. Electronically Signed   By: Rise Mu M.D.   On: 09/08/2017 18:18   Ct Chest Wo Contrast  Result Date: 08/30/2017 CLINICAL DATA:  84 year old female with question esophageal injury with possible pneumomediastinum identified on 08/18/2017 CT. Patient given oral contrast evaluate for extraluminal contrast/esophageal injury. History of neck surgery in 05/2017. Also with cough and lethargy. EXAM: CT CHEST WITHOUT CONTRAST TECHNIQUE: Multidetector CT imaging of the chest was performed following the standard protocol without IV contrast. COMPARISON:  08/28/2017 chest CT and prior studies FINDINGS: Cardiovascular: Cardiomegaly and coronary artery/thoracic aortic atherosclerotic calcifications again noted. Ectatic ascending thoracic aorta measuring 3.8 cm again noted. No pericardial effusion. Mediastinum/Nodes: Oral contrast within the mid and distal esophagus and proximal stomach identified. There is no evidence of extraluminal contrast or mediastinal air on today's study. Shotty mediastinal and bilateral hilar nodes are again noted. Lungs/Pleura: Diffuse ground-glass, interstitial and airspace opacities within both lungs are again noted appear slightly increased. Small bilateral pleural effusions are again noted, left-greater-than-right. There is no evidence of pneumothorax. Upper Abdomen: No acute abnormality. A small hiatal hernia is noted. Musculoskeletal: No acute or suspicious abnormalities. IMPRESSION: 1. No evidence of extraluminal oral contrast or mediastinal air on today's study. 2. Diffuse ground-glass, interstitial and airspace opacities bilaterally which appear slightly increased. This is nonspecific but may represent diffuse edema and/or infection. 3. Small bilateral pleural effusions, cardiomegaly and small hiatal hernia again noted. 4. Ectatic ascending thoracic aorta  again noted measuring 3.8 cm. 5. Coronary artery and aortic Atherosclerosis (ICD10-I70.0). Electronically Signed   By: Harmon Pier M.D.   On: 08/30/2017 10:38   Ct Chest W Contrast  Result Date: 09/08/2017 CLINICAL DATA:  Shortness of breath with cough for 2 or 3 weeks. History of hypertension, reflux and chronic kidney disease. Abnormal chest x-ray today demonstrating subcutaneous emphysema, small right pneumothorax and pneumomediastinum. EXAM: CT CHEST WITH CONTRAST TECHNIQUE: Multidetector CT imaging of the chest was performed during intravenous contrast administration. CONTRAST:  70mL ISOVUE-300 IOPAMIDOL (ISOVUE-300) INJECTION 61% COMPARISON:  Radiographs 09/08/2017 and 09/04/2017. CT 09/08/2017 and 08/30/2017. FINDINGS: Cardiovascular: Diffuse atherosclerosis of the aorta, great vessels and coronary arteries. There is stable mild ectasia of the ascending aorta. The pulmonary arteries are normally opacified. Stable mild cardiomegaly. No pericardial effusion. Mediastinum/Nodes: There are no enlarged mediastinal, hilar or axillary lymph nodes.There is diffuse pneumomediastinum. There is no evidence mediastinal fluid collection, focal inflammation or esophageal abnormality. The trachea and thyroid gland appear unremarkable. Lungs/Pleura: Small biapical pneumothoraces. No significant pleural effusion. There are diffuse ground-glass and airspace opacities in both lungs which have mildly improved over the last 10 days. Areas consolidation remain in the right middle lobe. There is associated architectural distortion and mild bronchiectasis. No suspicious pulmonary nodule. Upper abdomen: The visualized upper abdomen has a stable appearance without acute findings. There is a small hepatic cyst. No abnormal air collections are seen in the upper abdomen. Musculoskeletal/Chest wall: There is no chest wall mass or suspicious osseous finding. There are degenerative changes throughout the thoracic spine status post  cervical fusion. There is soft tissue emphysema in the lower neck and upper chest bilaterally. No focal fluid collection. Neck  findings dictated separately. IMPRESSION: 1. CT confirms the presence of extensive soft tissue emphysema in the lower neck and chest with pneumomediastinum and small biapical pneumothoraces. 2. No mediastinal fluid collection or focal esophageal abnormality identified. 3. The diffuse ground-glass and airspace opacities throughout both lungs have mildly improved over the last 10 days. 4.  Aortic Atherosclerosis (ICD10-I70.0). 5. Neck findings are dictated separately. 6. These results were called by telephone at the time of interpretation on 09/08/2017 at 6:12 pm to Dr. Algis Greenhouse , who verbally acknowledged these results. Electronically Signed   By: Carey Bullocks M.D.   On: 09/08/2017 18:12   Ct Angio Chest Pe W Or Wo Contrast  Result Date: 08/24/2017 CLINICAL DATA:  Bronchitis 2-3 weeks with completion of antibiotics and continued cough and lethargy. EXAM: CT ANGIOGRAPHY CHEST WITH CONTRAST TECHNIQUE: Multidetector CT imaging of the chest was performed using the standard protocol during bolus administration of intravenous contrast. Multiplanar CT image reconstructions and MIPs were obtained to evaluate the vascular anatomy. CONTRAST:  80mL ISOVUE-370 IOPAMIDOL (ISOVUE-370) INJECTION 76% COMPARISON:  Chest x-ray 08/19/2017 and 01/25/2017 as well as abdominal CT 03/12/2008 FINDINGS: Cardiovascular: Borderline cardiomegaly. Minimal calcified plaque over the left anterior descending and lateral circumflex coronary arteries. Calcified plaque over the thoracic aorta. There is mild ectasia of the ascending thoracic aorta measuring 3.8 cm in AP diameter. No evidence of dissection. Pulmonary arterial system is well opacified without evidence of emboli. Mediastinum/Nodes: No evidence of mediastinal or hilar adenopathy. Suggestion of a small hiatal hernia. Small collection of air adjacent the  left inferior heart border and few tiny foci of air adjacent the distal esophagus and gastroesophageal junction likely minimal pneumomediastinum. Lungs/Pleura: Lungs are somewhat hypoinflated demonstrate heterogeneous bilateral mixed interstitial airspace process. Small bilateral pleural effusions left greater than right. No cavitary lesion identified. Several air bronchograms. Airways are otherwise within normal. Upper Abdomen: 1.5 cm cyst over the anterior segment right lobe of the liver. Musculoskeletal: Degenerative change of the spine. Review of the MIP images confirms the above findings. IMPRESSION: No evidence of pulmonary embolism. Bilateral patchy heterogeneous mixed interstitial airspace process with small bilateral pleural effusions with left greater than right. Findings likely due to diffuse multifocal pneumonia. Findings suggesting minimal pneumomediastinum as this may be secondary to esophageal injury given a few small foci of air adjacent the esophagus. Mild cardiomegaly.  Mild atherosclerotic coronary artery disease. Ectasia of the ascending thoracic aorta measuring 3.8 cm in AP diameter. Recommend annual imaging followup by CTA or MRA. This recommendation follows 2010 ACCF/AHA/AATS/ACR/ASA/SCA/SCAI/SIR/STS/SVM Guidelines for the Diagnosis and Management of Patients with Thoracic Aortic Disease. Circulation.2010; 121: Z610-R604. 1.5 cm liver cyst. Aortic Atherosclerosis (ICD10-I70.0). Critical Value/emergent results were called by telephone at the time of interpretation on 09/05/2017 at 2:33 pm to Dr. Effie Shy, who verbally acknowledged these results. Electronically Signed   By: Elberta Fortis M.D.   On: 08/14/2017 14:33   Dg Chest Port 1 View  Result Date: 09/10/2017 CLINICAL DATA:  Follow-up pneumothorax. EXAM: PORTABLE CHEST 1 VIEW COMPARISON:  Chest radiograph 09/09/2017, CT 09/08/2017 FINDINGS: Again demonstrated small volume pneumomediastinum and RIGHT apical pneumothorax. Extensive subcutaneous  gas on LEFT and RIGHT chest wall unchanged. Underlying diffuse airspace disease similar to slightly improved. Anterior cervical fusion. IMPRESSION: 1. No change in small volume pneumomediastinum and RIGHT apical pneumothorax. 2. Stable subcutaneous emphysema. 3. Slight improvement in pulmonary edema pattern. Electronically Signed   By: Genevive Bi M.D.   On: 09/10/2017 08:06   Dg Chest [facility] 1 View  Result  Date: 09/09/2017 CLINICAL DATA:  Right apical pneumothorax. EXAM: PORTABLE CHEST 1 VIEW COMPARISON:  CT 09/08/2017.  Chest x-ray 09/08/2017, 09/04/2017. FINDINGS: Persistent pneumomediastinum and small apical pneumothoraces again noted. Persistent bilateral chest wall subcutaneous emphysema. Heart size stable. Stable aortic ectasia. Stable diffuse interstitial changes are again noted. No pleural effusion. No acute bony abnormality identified. Prior cervical spine fusion. Degenerative changes and scoliosis thoracolumbar spine. IMPRESSION: 1. Stable pneumomediastinum and tiny apical pneumothoraces. No evidence of progression. 2.  Stable diffuse bilateral interstitial prominence. Electronically Signed   By: Maisie Fushomas  Register   On: 09/09/2017 07:34   Dg Chest Port 1 View  Result Date: 09/08/2017 CLINICAL DATA:  Hypoxia.  Progressive cough. EXAM: PORTABLE CHEST 1 VIEW COMPARISON:  Chest x-rays dated 09/04/2017 and 08/31/2017 and chest CT dated 08/30/2017 FINDINGS: Since the prior exam the patient has developed bilateral subcutaneous emphysema in the axillary regions as well as a tiny right apical pneumothorax. The diffuse bilateral pulmonary infiltrates have improved. Heart size and vascularity are normal. No appreciable effusion. IMPRESSION: 1. New small right apical pneumothorax. 2. New bilateral axillary subcutaneous emphysema. This indicates that the patient has bilateral air leaks. 3. Improved bilateral pulmonary infiltrates. Critical Value/emergent results were called by telephone at the time of  interpretation on 09/08/2017 at 4:24 pm to Chales AbrahamsMary Ann, RN , who verbally acknowledged these results. Electronically Signed   By: Francene BoyersJames  Maxwell M.D.   On: 09/08/2017 16:26   Dg Chest Port 1 View  Result Date: 09/04/2017 CLINICAL DATA:  Shortness of breath EXAM: PORTABLE CHEST 1 VIEW COMPARISON:  August 31, 2017 FINDINGS: The cardiomediastinal silhouette is stable. Diffuse bilateral pulmonary opacities are unchanged. IMPRESSION: No interval change in diffuse bilateral pulmonary opacities. Electronically Signed   By: Gerome Samavid  Williams III M.D   On: 09/04/2017 09:08   Dg Chest Port 1 View  Result Date: 08/31/2017 CLINICAL DATA:  Hypoxia, shortness of breath. Admitted with pneumonia and sepsis. EXAM: PORTABLE CHEST 1 VIEW COMPARISON:  CT chest August 30, 2017 and chest radiograph August 29, 2017 FINDINGS: Diffuse interstitial and alveolar airspace opacities, small suspected pleural effusions. Cardiac silhouette is normal in size. Calcified aortic knob. Fullness of the RIGHT hilum. No pneumothorax. ACDF. IMPRESSION: Similar and alveolar airspace opacities concerning for pneumonia. Fullness of the RIGHT hila corresponding to vascular shadows on prior radiograph. Aortic Atherosclerosis (ICD10-I70.0). Electronically Signed   By: Awilda Metroourtnay  Bloomer M.D.   On: 08/31/2017 22:40   Dg Chest Port 1 View  Result Date: 08/16/2017 CLINICAL DATA:  Shortness of breath with cough and wheezing as well as weakness. EXAM: PORTABLE CHEST 1 VIEW COMPARISON:  01/25/2017 FINDINGS: Lungs are adequately inflated and demonstrate moderate bilateral patchy mixed interstitial airspace density. Possible cavitary process in the right infrahilar region. No definite effusion. Cardiac silhouette is within normal. Fusion hardware over the cervical spine. Remainder of the exam is unchanged. IMPRESSION: Moderate patchy bilateral mixed interstitial airspace process with possible cavitary process in the right infrahilar region likely due to  infection. Recommend follow-up to resolution. Electronically Signed   By: Elberta Fortisaniel  Boyle M.D.   On: 09/01/2017 10:44   Dg Chest Port 1v Same Day  Result Date: 09/09/2017 CLINICAL DATA:  Pneumothorax. EXAM: PORTABLE CHEST 1 VIEW COMPARISON:  09/09/2017 FINDINGS: Examination demonstrates the lungs to be adequately inflated with continued hazy bilateral airspace opacification. No definite effusion. Continued evidence of a small right apical pneumothorax without significant change. No definite left-sided pneumothorax visualized. Stable subcutaneous emphysema over the chest. Evidence of mild persistent pneumomediastinum. Cardiomediastinal  silhouette and remainder the exam is unchanged. IMPRESSION: Stable mild bilateral hazy airspace opacification. Stable known tiny right apical pneumothorax. Stable mild pneumomediastinum. Stable subcutaneous emphysema over the chest. Electronically Signed   By: Elberta Fortis M.D.   On: 09/09/2017 16:50    Catarina Hartshorn, DO  [facility] Pager [phone]  If 7PM-7AM, please contact night-coverage [email or web address] Password [email or web address] 09/10/2017, 5:40 PM   LOS: 12 days

## 2017-09-10 NOTE — Progress Notes (Addendum)
  Speech Language Pathology Treatment: Dysphagia  Patient Details Name: [name] MRN: [id number] DOB: 07/17/1927 Today's Date: 09/10/2017 Time: [id number] SLP Time Calculation (min) (ACUTE ONLY): 17 min  Assessment / Plan / Recommendation Clinical Impression  Dysphagia treatment provided to check diet tolerance. Today pt had consistent overt s/s of aspiration (throat clearing, weak cough) with sips of thin liquid and ice cream. Dinner tray arrived; pt declined trials of puree consistency stating "it burns when I eat it" (chocolate pudding). Great-granddaughter at bedside during treatment who indicated that pt's daughter advised her to perform a chin tuck; they felt that it was beneficial. Explained to pt and great-granddaughter that effectiveness of strategies are best assessed via MBSS. Great-granddaughter agrees; pt, however, denies swallowing difficulty, stating "it goes down fine" and states she is clearing her throat because of the burning sensation. Provided education re: rationale for MBS; however, pt does not desire to complete a MBS at this time and also does not wish to try nectar-thick liquids. Spoke with RN who indicated that these s/s of aspiration were also noted during the day today with thin liquids only. Pt is at risk of aspiration but currently does not desire any further modifications to diet or objective evaluation. Recommend continuing current diet, meds crushed in puree, supervision to cue small bites/ sips and assist with feeding as needed. Pt agreeable to SLP f/u for continued education and diet tolerance check.    HPI HPI: 84 y.o. female  with past medical history significant for CKD stage III, GERD, hypertension, paroxysmal atrial fibrillation on Xarelto, and peripheral vascular disease who presented to the emergency with worsening cough and shortness of breath over the last 2-3 weeks.  She has been diagnosed with sepsis secondary to multilobular pneumonia and was initially  thought to have some pneumomediastinum, but this appears to have resolved on repeat CT chest with gastrografin on 2/18. acute on chronic CHF,on BIPAP, IV lasix started admitted on 08/30/2017 with community-acquired pneumonia.       SLP Plan  Continue with current plan of care       Recommendations  Diet recommendations: Dysphagia 2 (fine chop);Thin liquid Liquids provided via: Cup;Straw Medication Administration: Crushed with puree Supervision: Patient able to self feed;Intermittent supervision to cue for compensatory strategies Compensations: Slow rate;Small sips/bites Postural Changes and/or Swallow Maneuvers: Seated upright 90 degrees;Upright 30-60 min after meal                Oral Care Recommendations: Oral care BID Follow up Recommendations: 24 hour supervision/assistance SLP Visit Diagnosis: Dysphagia, unspecified (R13.10) Plan: Continue with current plan of care       GO                Metro KungAmy K Mariabella Nilsen, MA, CCC-SLP 09/10/2017, 5:54 PM

## 2017-09-10 NOTE — Progress Notes (Signed)
Physical Therapy Treatment Patient Details Name: Lisa GamblerDorothy W Delrossi MRN: [id number] DOB: 12/10/1927 Today's Date: 09/10/2017    History of Present Illness Lisa LankDorothy Solomon is an 84yo white [facility] wa[facility] 2-3w SOB , coughing, after poor response to outpatient OBX: pt admitted for hypoxemic ARF, complicated by multifocal PNA and acute on chronic CHF. PMH: PAF, depression, TKA, CKD, HTN, GERD, and 3MA ACDF + fusion C3/4, C5/6. C6/7. Pt reports chronic RLE weakness as well related to lumbar spine, s/p Rt foot drop with AFO use x3 months.     PT Comments    Pt agreeable to exercises.  Significant weakness in both LE but Rt generally 1+ to 2- where left  Varies from 2 to 3-.  Pt tolerated sitting at bed side for 7 minutes.    Follow Up Recommendations  SNF;Supervision for mobility/OOB;Supervision/Assistance - 24 hour     Equipment Recommendations  None recommended by PT    Recommendations for Other Services       Precautions / Restrictions Precautions Precautions: Fall Restrictions Weight Bearing Restrictions: No    Mobility  Bed Mobility Overal bed mobility: Needs Assistance Bed Mobility: Supine to Sit;Sit to Supine     Supine to sit: Max assist Sit to supine: Max assist      Transfers Overall transfer level: (dferred d/t high o2 need, pt on HFNC 12L/min)                  Ambulation/Gait                 Stairs            Wheelchair Mobility    Modified Rankin (Stroke Patients Only)       Balance                                            Cognition Arousal/Alertness: Awake/alert Behavior During Therapy: WFL for tasks assessed/performed Overall Cognitive Status: Within Functional Limits for tasks assessed                                        Exercises General Exercises - Lower Extremity Ankle Circles/Pumps: AROM;PROM;10 reps;Both Quad Sets: Both;10 reps Gluteal Sets: Both;10  reps Heel Slides: Both;10 reps Hip ABduction/ADduction: Both;10 reps Heel Raises: Both;10 reps    General Comments        Pertinent Vitals/Pain      Home Living                      Prior Function   I with assistive device.          PT Goals (current goals can now be found in the care plan section) Acute Rehab PT Goals Patient Stated Goal: regain independence with mobility ADL/ IADL PT Goal Formulation: With patient Time For Goal Achievement: 09/20/17 Potential to Achieve Goals: Good Progress towards PT goals: Progressing toward goals    Frequency    Min 3X/week      PT Plan Current plan remains appropriate    Co-evaluation              AM-PAC PT "6 Clicks" Daily Activity  Outcome Measure  End of Session Equipment Utilized During Treatment: Oxygen Activity Tolerance: Patient limited by fatigue(a few pauses for desaturation to 87%. ) Patient left: in bed;with bed alarm set;with call bell/phone within reach;with family/visitor present(chair-position, heels floating) Nurse Communication: Mobility status(O2 cylinder found) PT Visit Diagnosis: Muscle weakness (generalized) (M62.81);Difficulty in walking, not elsewhere classified (R26.2)     Time: 4098-1191 PT Time Calculation (min) (ACUTE ONLY): 42 min  Charges:  $Therapeutic Exercise: 23-37 mins $Therapeutic Activity: 8-22 mins                         Virgina Organ, PT CLT [phone] 09/10/2017, 4:17 PM

## 2017-09-10 DEATH — deceased

## 2017-09-11 ENCOUNTER — Inpatient Hospital Stay (HOSPITAL_COMMUNITY): Payer: Medicare Other

## 2017-09-11 LAB — BASIC METABOLIC PANEL
Anion gap: 15 (ref 5–15)
BUN: 109 mg/dL — AB (ref 6–20)
CO2: 27 mmol/L (ref 22–32)
CREATININE: 2.02 mg/dL — AB (ref 0.44–1.00)
Calcium: 8.9 mg/dL (ref 8.9–10.3)
Chloride: 98 mmol/L — ABNORMAL LOW (ref 101–111)
GFR calc Af Amer: 24 mL/min — ABNORMAL LOW (ref >60)
GFR, EST NON AFRICAN AMERICAN: 21 mL/min — AB (ref >60)
GLUCOSE: 139 mg/dL — AB (ref 65–99)
Potassium: 3.7 mmol/L (ref 3.5–5.1)
SODIUM: 140 mmol/L (ref 135–145)

## 2017-09-11 LAB — GLUCOSE, CAPILLARY: GLUCOSE-CAPILLARY: 147 mg/dL — AB (ref 65–99)

## 2017-09-11 MED ORDER — DILTIAZEM HCL-DEXTROSE 100-5 MG/100ML-% IV SOLN (PREMIX)
5.0000 mg/h | INTRAVENOUS | Status: DC
Start: 2017-09-11 — End: 2017-09-12
  Administered 2017-09-11: 5 mg/h via INTRAVENOUS
  Administered 2017-09-12: 10 mg/h via INTRAVENOUS
  Filled 2017-09-11 (×2): qty 100

## 2017-09-11 MED ORDER — DIGOXIN 0.25 MG/ML IJ SOLN
0.2500 mg | Freq: Once | INTRAMUSCULAR | Status: AC
  Administered 2017-09-11: 0.25 mg via INTRAVENOUS
  Filled 2017-09-11: qty 2

## 2017-09-11 MED ORDER — METHYLPREDNISOLONE SODIUM SUCC 40 MG IJ SOLR
40.0000 mg | Freq: Every day | INTRAMUSCULAR | Status: DC
  Administered 2017-09-12: 40 mg via INTRAVENOUS
  Filled 2017-09-11: qty 1

## 2017-09-11 NOTE — Progress Notes (Addendum)
CSW was consulted by physician concerning pt's  possibly placement.  Information on pt was sent out to facilities on 3/1.  CSW called [facility] to check status of pending placement.  CSW spoke with Ezra SitesKathy Smith concerning pending information.  Olegario MessierKathy said to call on Monday to speak with Francisco CapuchinStacey Wilkins at [phone].  CSW updated physician on pt's status.  Budd Palmerara Anjelina Dung LCSWA [phone]

## 2017-09-11 NOTE — Progress Notes (Signed)
Patient down from 300 for Cardizem drip. CHG bath complete. BP is stable at this time. Family at bedside.

## 2017-09-11 NOTE — Progress Notes (Addendum)
PROGRESS NOTE  [name]:[id number] DOB: 11/19/1927 DOA: 08/22/2017 PCP: Lenell Antu, DO  Interim summary and HPI 84 year old female with history significant for CKD stage III, GERD, hypertension, paroxysmal atrial fibrillation on Xarelto, and peripheral vascular disease who presented to the emergency with worsening cough and shortness of breath over the last 2-3 weeks. She has been diagnosed with sepsis secondary to multilobular pneumonia and was initially thought to have some pneumomediastinum, but this appears to have resolved on repeat CT chest with gastrografin on 2/18. Case complicated with acute on chronic CHF.The patient was started on intravenous furosemide with good clinical effect. Her respiratory status improved, and she was moved to the medical floor. Unfortunately, she remained hypoxemic requiring high flow nasal cannula. Repeat chest x-ray showed right apical pneumothorax. CT of the chest was obtained and it showed pneumomediastinum and small bilateral pneumothoraces. General surgery was consulted.  Thoracic surgery was also contacted who recommended close observation without surgical intervention.  On 09/11/2017, the patient developed atrial fibrillation with RVR.  Her respiratory status worsened requiring 15 L HFNC  Assessment/Plan: acute hypoxemic resp failure: in the setting of multifocal PNA, RAD and now acute on chronic diastolic CHF  -Completedabx's on 2/25. -Secondary to pneumothorax, pneumomediastinum, lung scarring -still requiringHFNC at 12 L-->now up to 15L on 3/2 with more dyspnea -pt did not tolerate BiPAP well -Continue duonebs, continue Pulmicort, and steroids for acute RAD and potential underlying non-diagnosed COPD  -3/1 Repeat chest x-ray--personally reviewed small right apical pneumothorax -continue IV solumedrol 40 mg-->decrease to once daily -continue Daily weights, strict intake and output -hold PO lasix due to AKI -appreciate  Palliative care consult; Patient now full DNR -09/11/17--repeat CXR  Lobar pneumonia -finished 7 days merrem on 2/25  Pneumothorax and pneumomediastinum -09/08/2017 CT chest--soft tissue emphysema in the lower neck and chest with pneumomediastinum and small biapical pneumothoraces. -case discussed with Dr. Henreitta Leber who discussed with TCTS, Dr. Mar Daring likely from a bleb that has ruptured and her lungs are so poorly compliant that they are sticking to the pleural; low suspicion for any esophageal leak or bronchial tree leak, suggests watching and monitoring -3/1 Repeat chest x-ray--personally reviewed small right apical pneumothorax  Acute on chronic renal failure--CKD 3 -due to contrast nephropathy -had CT chest 09/08/17 -baseline creatinine 0.8-1.0 -serum creatinine peaked 2.12 -am BMP  Atrial fibrillation with RVR -Continue Lopressor andamio -CHADsVASC score 4 -continue xarelto -developed RVR am 09/11/17 -09/11/17 transfer to SDU -start dilitazem drip  acute on chronic diastolic HF -hold lasix due to AKI -clinically euvolemic on exam -BNP 626>>>80 -Patient reporting good urine output and currently without any signs offluid overload on exam. -09/01/2017 echo EF 60-65%, grade 1 DD, mild TR, PASP 30, no WMA  elevated troponin -in the setting of demand ischemia  -09/01/17 Echo--EF 60-65%, grade 1 DD, mild TR, PASP 30 -No acute ischemic changes appreciated on telemetry or EKG. -Patient remains chest pain-free.  hypokalemia -repeat as needed  GERD -continue PPI  physical deconditioning: -Physical therapy has recommended skilled nursing facility at discharge  HSV gingivostomatitis -valtrex 1 gram bid x 2 doses -improving  Dysphagia -Dys 2 diet with thin liquids  Code Status:DNR Family Communication:daughter at bedside updated 3/2--Total time spent 35 minutes. Greater than 50% spent face to face counseling and coordinating care.  Disposition  Plan:transfer to SDU DVT Prophylaxis:Xarelto  Consultants:  Palliative Care  Procedures:  2-D echo -preserved EF -grade 1 diastolic HF -no wall motion  abnormalities   Antibiotics:  Ceftriaxone and Zithromax 2/17>>2/17  Meropenem 2/17>>2/25       Subjective: Patient is less interactive today.  She complains of slightly more shortness of breath.  She denies any chest pain, nausea, vomiting, abdominal pain.  She has no appetite today.  She denies any headache or neck pain.  Objective: Vitals:   09/10/17 2304 09/11/17 0436 09/11/17 0742 09/11/17 0745  BP:  108/70    Pulse:  88    Resp:      Temp:  (!) 97.5 F (36.4 C)    TempSrc:  Axillary    SpO2: 94% 90% 92% 92%  Weight:  87.4 kg (192 lb 10.9 oz)    Height:        Intake/Output Summary (Last 24 hours) at 09/11/2017 1409 Last data filed at 09/11/2017 0720 Gross per 24 hour  Intake 240 ml  Output 1125 ml  Net -885 ml   Weight change: 0.2 kg (7.1 oz) Exam:   General:  Pt is alert, follows commands appropriately  HEENT: No icterus, No thrush, No neck mass, [place]/AT  Cardiovascular: IRRR, S1/S2, no rubs, no gallops  Respiratory: Bilateral crackles.  No wheezing.  Abdomen: Soft/+BS, non tender, non distended, no guarding  Extremities: No edema, No lymphangitis, No petechiae, No rashes, no synovitis   Data Reviewed: I have personally reviewed following labs and imaging studies Basic Metabolic Panel: Recent Labs  Lab 09/06/17 0547 09/08/17 1259 09/09/17 0528 09/10/17 0952 09/11/17 0604  NA 139 138 140 141 140  K 3.5 3.2* 3.6 3.6 3.7  CL 97* 94* 95* 97* 98*  CO2 32 30 30 29 27   GLUCOSE 147* 137* 161* 187* 139*  BUN 44* 55* 64* 98* 109*  CREATININE 0.78 0.97 1.15* 2.12* 2.02*  CALCIUM 8.2* 8.6* 8.8* 8.6* 8.9  MG  --   --   --  2.7*  --    Liver Function Tests: No results for input(s): AST, ALT, ALKPHOS, BILITOT, PROT, ALBUMIN in the last 168 hours. No results for input(s): LIPASE, AMYLASE  in the last 168 hours. No results for input(s): AMMONIA in the last 168 hours. Coagulation Profile: No results for input(s): INR, PROTIME in the last 168 hours. CBC: Recent Labs  Lab 09/06/17 0547 09/08/17 1259 09/09/17 0528 09/10/17 0952  WBC 12.5* 17.5* 17.4* 13.1*  HGB 13.4 15.3* 15.6* 15.1*  HCT 41.9 46.9* 47.2* 46.7*  MCV 91.1 90.0 88.4 89.8  PLT 193 242 250 185   Cardiac Enzymes: No results for input(s): CKTOTAL, CKMB, CKMBINDEX, TROPONINI in the last 168 hours. BNP: Invalid input(s): POCBNP CBG: Recent Labs  Lab 09/08/17 0643 09/09/17 0730 09/09/17 1110 09/10/17 0728 09/11/17 0757  GLUCAP 140* 165* 212* 178* 147*   HbA1C: No results for input(s): HGBA1C in the last 72 hours. Urine analysis:    Component Value Date/Time   COLORURINE YELLOW Sep 04, 2017 1451   APPEARANCEUR HAZY (A) 2017-09-04 1451   LABSPEC 1.031 (H) Sep 04, 2017 1451   PHURINE 5.0 09-04-17 1451   GLUCOSEU NEGATIVE 2017-09-04 1451   HGBUR NEGATIVE 2017-09-04 1451   BILIRUBINUR NEGATIVE 09/04/2017 1451   KETONESUR 5 (A) Sep 04, 2017 1451   PROTEINUR 30 (A) 09/04/2017 1451   UROBILINOGEN 0.2 10/25/2012 1303   NITRITE NEGATIVE 2017/09/04 1451   LEUKOCYTESUR NEGATIVE Sep 04, 2017 1451   Sepsis Labs: @LABRCNTIP (procalcitonin:4,lacticidven:4) ) Recent Results (from the past 240 hour(s))  MRSA PCR Screening     Status: None   Collection Time: 09/01/17  5:19 PM  Result Value Ref Range  Status   MRSA by PCR NEGATIVE NEGATIVE Final    Comment:        The GeneXpert MRSA Assay (FDA approved for NASAL specimens only), is one component of a comprehensive MRSA colonization surveillance program. It is not intended to diagnose MRSA infection nor to guide or monitor treatment for MRSA infections. Performed at [facility], [address] [place], [place] [address]      Scheduled Meds: . amiodarone  200 mg Oral Daily  . bisacodyl  10 mg Rectal Daily  . budesonide (PULMICORT) nebulizer solution   0.5 mg Nebulization BID  . feeding supplement (ENSURE ENLIVE)  237 mL Oral BID BM  . fluticasone  1 spray Each Nare Daily  . ipratropium-albuterol  3 mL Nebulization TID  . magic mouthwash  2.5 mL Oral QID   And  . lidocaine  2.5 mL Mouth/Throat QID  . [START ON ] methylPREDNISolone (SOLU-MEDROL) injection  40 mg Intravenous Daily  . metoprolol tartrate  12.5 mg Oral BID  . multivitamin with minerals  1 tablet Oral Daily  . pantoprazole  40 mg Oral Daily  . rivaroxaban  15 mg Oral Q supper  . rOPINIRole  0.5 mg Oral QHS   Continuous Infusions: . diltiazem (CARDIZEM) infusion      Procedures/Studies: Ct Soft Tissue Neck W Contrast  Result Date: 09/08/2017 CLINICAL DATA:  Follow-up examination for pneumothorax. Patient with known right-sided pneumothorax. EXAM: CT NECK WITH CONTRAST TECHNIQUE: Multidetector CT imaging of the neck was performed using the standard protocol following the bolus administration of intravenous contrast. CONTRAST:  70mL ISOVUE-300 IOPAMIDOL (ISOVUE-300) INJECTION 61% COMPARISON:  Prior radiograph from 08/25/2017. FINDINGS: Pharynx and larynx: Oral cavity within normal limits without mass lesion or loculated fluid collection. Extensive streak artifact from dental amalgam. No acute inflammatory changes about the dentition. Oropharynx within normal limits. Parapharyngeal fat preserved. Nasopharynx normal. No retropharyngeal collection or edema. Few small subcentimeter nodular densities seen along the anterior margin of the epiglottis, indeterminate, but could reflect small polyps (series 12, image 40). Remainder of the hypopharynx and supraglottic larynx without acute abnormality. True cords symmetric and normal. Subglottic airway tortuous but widely patent. Salivary glands: Parotid and submandibular glands within normal limits. Tiny calcification at the inferior aspect of the right submandibular gland favored to be vascular in nature. Thyroid: Thyroid within normal  limits. Lymph nodes: No adenopathy within the neck. Vascular: Normal intravascular enhancement seen throughout the neck. Atherosclerosis noted. Limited intracranial: Unremarkable. Visualized orbits: Visualized globes and orbital soft tissues within normal limits. Patient status post cataract extraction bilaterally. Mastoids and visualized paranasal sinuses: Chronic sphenoid sinusitis noted. Visualized paranasal sinuses are otherwise largely clear. Visualized mastoids and middle ear cavities are clear. Skeleton: No acute osseous abnormality. No worrisome lytic or blastic osseous lesions. Patient status post cervical fusion at C3 through C7. Multilevel facet arthropathy noted. Prominent degenerative thickening noted at the tectorial membrane. Upper chest: Pneumomediastinum noted within the visualized upper mediastinum. Right-sided pneumothorax, incompletely visualized. Associated soft tissue emphysema within the bilateral anterolateral chest wall. Diffuse ground-glass opacity with interlobular septal thickening within the visualized lungs, suggesting pulmonary edema/CHF. Superimposed infection not excluded. Other: Gas lucency present within the right carotid space, extending inferiorly from known right apical pneumothorax. Associated soft tissue emphysema within the visualized anterolateral chest wall bilaterally. IMPRESSION: 1. Right-sided pneumothorax, incompletely visualized. Associated soft tissue emphysema within the anterolateral chest wall bilaterally, extending superiorly within the carotid space of the right neck. Etiology unclear. 2. Extensive ground-glass opacity with interlobular septal thickening  within the visualized lungs, suggesting pulmonary edema/CHF. Findings better evaluated on concomitant CT of the chest. Superimposed infection would be difficult to exclude. 3. No other acute abnormality within the neck. 4. Few subcentimeter nodular densities along the anterior margin of the epiglottis as above,  indeterminate, but could reflect a few small polyps. Correlation with physical exam/direct visualization recommended. 5. Chronic sphenoid sinusitis. Electronically Signed   By: Rise Mu M.D.   On: 09/08/2017 18:18   Ct Chest Wo Contrast  Result Date: 08/30/2017 CLINICAL DATA:  84 year old female with question esophageal injury with possible pneumomediastinum identified on 09-26-2017 CT. Patient given oral contrast evaluate for extraluminal contrast/esophageal injury. History of neck surgery in 05/2017. Also with cough and lethargy. EXAM: CT CHEST WITHOUT CONTRAST TECHNIQUE: Multidetector CT imaging of the chest was performed following the standard protocol without IV contrast. COMPARISON:  09-26-2017 chest CT and prior studies FINDINGS: Cardiovascular: Cardiomegaly and coronary artery/thoracic aortic atherosclerotic calcifications again noted. Ectatic ascending thoracic aorta measuring 3.8 cm again noted. No pericardial effusion. Mediastinum/Nodes: Oral contrast within the mid and distal esophagus and proximal stomach identified. There is no evidence of extraluminal contrast or mediastinal air on today's study. Shotty mediastinal and bilateral hilar nodes are again noted. Lungs/Pleura: Diffuse ground-glass, interstitial and airspace opacities within both lungs are again noted appear slightly increased. Small bilateral pleural effusions are again noted, left-greater-than-right. There is no evidence of pneumothorax. Upper Abdomen: No acute abnormality. A small hiatal hernia is noted. Musculoskeletal: No acute or suspicious abnormalities. IMPRESSION: 1. No evidence of extraluminal oral contrast or mediastinal air on today's study. 2. Diffuse ground-glass, interstitial and airspace opacities bilaterally which appear slightly increased. This is nonspecific but may represent diffuse edema and/or infection. 3. Small bilateral pleural effusions, cardiomegaly and small hiatal hernia again noted. 4. Ectatic  ascending thoracic aorta again noted measuring 3.8 cm. 5. Coronary artery and aortic Atherosclerosis (ICD10-I70.0). Electronically Signed   By: Harmon Pier M.D.   On: 08/30/2017 10:38   Ct Chest W Contrast  Result Date: 09/08/2017 CLINICAL DATA:  Shortness of breath with cough for 2 or 3 weeks. History of hypertension, reflux and chronic kidney disease. Abnormal chest x-ray today demonstrating subcutaneous emphysema, small right pneumothorax and pneumomediastinum. EXAM: CT CHEST WITH CONTRAST TECHNIQUE: Multidetector CT imaging of the chest was performed during intravenous contrast administration. CONTRAST:  70mL ISOVUE-300 IOPAMIDOL (ISOVUE-300) INJECTION 61% COMPARISON:  Radiographs 09/08/2017 and 09/04/2017. CT 2017/09/26 and 08/30/2017. FINDINGS: Cardiovascular: Diffuse atherosclerosis of the aorta, great vessels and coronary arteries. There is stable mild ectasia of the ascending aorta. The pulmonary arteries are normally opacified. Stable mild cardiomegaly. No pericardial effusion. Mediastinum/Nodes: There are no enlarged mediastinal, hilar or axillary lymph nodes.There is diffuse pneumomediastinum. There is no evidence mediastinal fluid collection, focal inflammation or esophageal abnormality. The trachea and thyroid gland appear unremarkable. Lungs/Pleura: Small biapical pneumothoraces. No significant pleural effusion. There are diffuse ground-glass and airspace opacities in both lungs which have mildly improved over the last 10 days. Areas consolidation remain in the right middle lobe. There is associated architectural distortion and mild bronchiectasis. No suspicious pulmonary nodule. Upper abdomen: The visualized upper abdomen has a stable appearance without acute findings. There is a small hepatic cyst. No abnormal air collections are seen in the upper abdomen. Musculoskeletal/Chest wall: There is no chest wall mass or suspicious osseous finding. There are degenerative changes throughout the thoracic  spine status post cervical fusion. There is soft tissue emphysema in the lower neck and upper chest bilaterally. No focal fluid  collection. Neck findings dictated separately. IMPRESSION: 1. CT confirms the presence of extensive soft tissue emphysema in the lower neck and chest with pneumomediastinum and small biapical pneumothoraces. 2. No mediastinal fluid collection or focal esophageal abnormality identified. 3. The diffuse ground-glass and airspace opacities throughout both lungs have mildly improved over the last 10 days. 4.  Aortic Atherosclerosis (ICD10-I70.0). 5. Neck findings are dictated separately. 6. These results were called by telephone at the time of interpretation on 09/08/2017 at 6:12 pm to Dr. Algis Greenhouse , who verbally acknowledged these results. Electronically Signed   By: Carey Bullocks M.D.   On: 09/08/2017 18:12   Ct Angio Chest Pe W Or Wo Contrast  Result Date: 2017/09/08 CLINICAL DATA:  Bronchitis 2-3 weeks with completion of antibiotics and continued cough and lethargy. EXAM: CT ANGIOGRAPHY CHEST WITH CONTRAST TECHNIQUE: Multidetector CT imaging of the chest was performed using the standard protocol during bolus administration of intravenous contrast. Multiplanar CT image reconstructions and MIPs were obtained to evaluate the vascular anatomy. CONTRAST:  80mL ISOVUE-370 IOPAMIDOL (ISOVUE-370) INJECTION 76% COMPARISON:  Chest x-ray 09-08-2017 and 01/25/2017 as well as abdominal CT 03/12/2008 FINDINGS: Cardiovascular: Borderline cardiomegaly. Minimal calcified plaque over the left anterior descending and lateral circumflex coronary arteries. Calcified plaque over the thoracic aorta. There is mild ectasia of the ascending thoracic aorta measuring 3.8 cm in AP diameter. No evidence of dissection. Pulmonary arterial system is well opacified without evidence of emboli. Mediastinum/Nodes: No evidence of mediastinal or hilar adenopathy. Suggestion of a small hiatal hernia. Small collection  of air adjacent the left inferior heart border and few tiny foci of air adjacent the distal esophagus and gastroesophageal junction likely minimal pneumomediastinum. Lungs/Pleura: Lungs are somewhat hypoinflated demonstrate heterogeneous bilateral mixed interstitial airspace process. Small bilateral pleural effusions left greater than right. No cavitary lesion identified. Several air bronchograms. Airways are otherwise within normal. Upper Abdomen: 1.5 cm cyst over the anterior segment right lobe of the liver. Musculoskeletal: Degenerative change of the spine. Review of the MIP images confirms the above findings. IMPRESSION: No evidence of pulmonary embolism. Bilateral patchy heterogeneous mixed interstitial airspace process with small bilateral pleural effusions with left greater than right. Findings likely due to diffuse multifocal pneumonia. Findings suggesting minimal pneumomediastinum as this may be secondary to esophageal injury given a few small foci of air adjacent the esophagus. Mild cardiomegaly.  Mild atherosclerotic coronary artery disease. Ectasia of the ascending thoracic aorta measuring 3.8 cm in AP diameter. Recommend annual imaging followup by CTA or MRA. This recommendation follows 2010 ACCF/AHA/AATS/ACR/ASA/SCA/SCAI/SIR/STS/SVM Guidelines for the Diagnosis and Management of Patients with Thoracic Aortic Disease. Circulation.2010; 121: [id number]. 1.5 cm liver cyst. Aortic Atherosclerosis (ICD10-I70.0). Critical Value/emergent results were called by telephone at the time of interpretation on 09/08/17 at 2:33 pm to Dr. Effie Shy, who verbally acknowledged these results. Electronically Signed   By: Elberta Fortis M.D.   On: 09-08-2017 14:33   Dg Chest Port 1 View  Result Date: 09/10/2017 CLINICAL DATA:  Follow-up pneumothorax. EXAM: PORTABLE CHEST 1 VIEW COMPARISON:  Chest radiograph 09/09/2017, CT 09/08/2017 FINDINGS: Again demonstrated small volume pneumomediastinum and RIGHT apical pneumothorax.  Extensive subcutaneous gas on LEFT and RIGHT chest wall unchanged. Underlying diffuse airspace disease similar to slightly improved. Anterior cervical fusion. IMPRESSION: 1. No change in small volume pneumomediastinum and RIGHT apical pneumothorax. 2. Stable subcutaneous emphysema. 3. Slight improvement in pulmonary edema pattern. Electronically Signed   By: Genevive Bi M.D.   On: 09/10/2017 08:06   Dg Chest [facility]  Result Date: 09/09/2017 CLINICAL DATA:  Right apical pneumothorax. EXAM: PORTABLE CHEST 1 VIEW COMPARISON:  CT 09/08/2017.  Chest x-ray 09/08/2017, 09/04/2017. FINDINGS: Persistent pneumomediastinum and small apical pneumothoraces again noted. Persistent bilateral chest wall subcutaneous emphysema. Heart size stable. Stable aortic ectasia. Stable diffuse interstitial changes are again noted. No pleural effusion. No acute bony abnormality identified. Prior cervical spine fusion. Degenerative changes and scoliosis thoracolumbar spine. IMPRESSION: 1. Stable pneumomediastinum and tiny apical pneumothoraces. No evidence of progression. 2.  Stable diffuse bilateral interstitial prominence. Electronically Signed   By: Maisie Fus  Register   On: 09/09/2017 07:34   Dg Chest Port 1 View  Result Date: 09/08/2017 CLINICAL DATA:  Hypoxia.  Progressive cough. EXAM: PORTABLE CHEST 1 VIEW COMPARISON:  Chest x-rays dated 09/04/2017 and 08/31/2017 and chest CT dated 08/30/2017 FINDINGS: Since the prior exam the patient has developed bilateral subcutaneous emphysema in the axillary regions as well as a tiny right apical pneumothorax. The diffuse bilateral pulmonary infiltrates have improved. Heart size and vascularity are normal. No appreciable effusion. IMPRESSION: 1. New small right apical pneumothorax. 2. New bilateral axillary subcutaneous emphysema. This indicates that the patient has bilateral air leaks. 3. Improved bilateral pulmonary infiltrates. Critical Value/emergent results were called by  telephone at the time of interpretation on 09/08/2017 at 4:24 pm to Chales Abrahams, RN , who verbally acknowledged these results. Electronically Signed   By: Francene Boyers M.D.   On: 09/08/2017 16:26   Dg Chest Port 1 View  Result Date: 09/04/2017 CLINICAL DATA:  Shortness of breath EXAM: PORTABLE CHEST 1 VIEW COMPARISON:  August 31, 2017 FINDINGS: The cardiomediastinal silhouette is stable. Diffuse bilateral pulmonary opacities are unchanged. IMPRESSION: No interval change in diffuse bilateral pulmonary opacities. Electronically Signed   By: Gerome Sam III M.D   On: 09/04/2017 09:08   Dg Chest Port 1 View  Result Date: 08/31/2017 CLINICAL DATA:  Hypoxia, shortness of breath. Admitted with pneumonia and sepsis. EXAM: PORTABLE CHEST 1 VIEW COMPARISON:  CT chest August 30, 2017 and chest radiograph August 29, 2017 FINDINGS: Diffuse interstitial and alveolar airspace opacities, small suspected pleural effusions. Cardiac silhouette is normal in size. Calcified aortic knob. Fullness of the RIGHT hilum. No pneumothorax. ACDF. IMPRESSION: Similar and alveolar airspace opacities concerning for pneumonia. Fullness of the RIGHT hila corresponding to vascular shadows on prior radiograph. Aortic Atherosclerosis (ICD10-I70.0). Electronically Signed   By: Awilda Metro M.D.   On: 08/31/2017 22:40   Dg Chest Port 1 View  Result Date: 09/06/2017 CLINICAL DATA:  Shortness of breath with cough and wheezing as well as weakness. EXAM: PORTABLE CHEST 1 VIEW COMPARISON:  01/25/2017 FINDINGS: Lungs are adequately inflated and demonstrate moderate bilateral patchy mixed interstitial airspace density. Possible cavitary process in the right infrahilar region. No definite effusion. Cardiac silhouette is within normal. Fusion hardware over the cervical spine. Remainder of the exam is unchanged. IMPRESSION: Moderate patchy bilateral mixed interstitial airspace process with possible cavitary process in the right infrahilar  region likely due to infection. Recommend follow-up to resolution. Electronically Signed   By: Elberta Fortis M.D.   On: 09/01/2017 10:44   Dg Chest Port 1v Same Day  Result Date: 09/09/2017 CLINICAL DATA:  Pneumothorax. EXAM: PORTABLE CHEST 1 VIEW COMPARISON:  09/09/2017 FINDINGS: Examination demonstrates the lungs to be adequately inflated with continued hazy bilateral airspace opacification. No definite effusion. Continued evidence of a small right apical pneumothorax without significant change. No definite left-sided pneumothorax visualized. Stable subcutaneous emphysema over the chest. Evidence of mild persistent pneumomediastinum.  Cardiomediastinal silhouette and remainder the exam is unchanged. IMPRESSION: Stable mild bilateral hazy airspace opacification. Stable known tiny right apical pneumothorax. Stable mild pneumomediastinum. Stable subcutaneous emphysema over the chest. Electronically Signed   By: Elberta Fortisaniel  Boyle M.D.   On: 09/09/2017 16:50    Catarina Hartshornavid Janye Maynor, DO  [facility] Pager [phone]  If 7PM-7AM, please contact night-coverage [email or web address] Password [email or web address] 09/11/2017, 2:09 PM   LOS: 13 days

## 2017-09-12 ENCOUNTER — Inpatient Hospital Stay (HOSPITAL_COMMUNITY): Payer: Medicare Other

## 2017-09-12 DIAGNOSIS — I5033 Acute on chronic diastolic (congestive) heart failure: Secondary | ICD-10-CM

## 2017-09-12 DIAGNOSIS — Z7189 Other specified counseling: Secondary | ICD-10-CM

## 2017-09-12 LAB — CBC
HCT: 50.7 % — ABNORMAL HIGH (ref 36.0–46.0)
Hemoglobin: 16.4 g/dL — ABNORMAL HIGH (ref 12.0–15.0)
MCH: 29.1 pg (ref 26.0–34.0)
MCHC: 32.3 g/dL (ref 30.0–36.0)
MCV: 90.1 fL (ref 78.0–100.0)
Platelets: 206 10*3/uL (ref 150–400)
RBC: 5.63 MIL/uL — ABNORMAL HIGH (ref 3.87–5.11)
RDW: 15.2 % (ref 11.5–15.5)
WBC: 14.7 10*3/uL — ABNORMAL HIGH (ref 4.0–10.5)

## 2017-09-12 LAB — BASIC METABOLIC PANEL
Anion gap: 19 — ABNORMAL HIGH (ref 5–15)
BUN: 147 mg/dL — ABNORMAL HIGH (ref 6–20)
CALCIUM: 8.6 mg/dL — AB (ref 8.9–10.3)
CHLORIDE: 98 mmol/L — AB (ref 101–111)
CO2: 23 mmol/L (ref 22–32)
CREATININE: 2.95 mg/dL — AB (ref 0.44–1.00)
GFR calc Af Amer: 15 mL/min — ABNORMAL LOW (ref >60)
GFR calc non Af Amer: 13 mL/min — ABNORMAL LOW (ref >60)
GLUCOSE: 184 mg/dL — AB (ref 65–99)
Potassium: 4.5 mmol/L (ref 3.5–5.1)
Sodium: 140 mmol/L (ref 135–145)

## 2017-09-12 LAB — GLUCOSE, CAPILLARY: GLUCOSE-CAPILLARY: 190 mg/dL — AB (ref 65–99)

## 2017-09-12 LAB — MAGNESIUM: Magnesium: 3.5 mg/dL — ABNORMAL HIGH (ref 1.7–2.4)

## 2017-09-12 LAB — PHOSPHORUS: PHOSPHORUS: 7 mg/dL — AB (ref 2.5–4.6)

## 2017-09-12 MED ORDER — MORPHINE SULFATE (PF) 2 MG/ML IV SOLN
INTRAVENOUS | Status: AC
  Administered 2017-09-12: 2 mg via INTRAVENOUS
  Filled 2017-09-12: qty 1

## 2017-09-12 MED ORDER — MORPHINE SULFATE (PF) 2 MG/ML IV SOLN
2.0000 mg | Freq: Once | INTRAVENOUS | Status: AC
  Administered 2017-09-12: 2 mg via INTRAVENOUS

## 2017-09-12 MED ORDER — LORAZEPAM 2 MG/ML IJ SOLN: 1.0000 mg | Freq: Once | INTRAMUSCULAR | Status: DC

## 2017-09-12 MED ORDER — LORAZEPAM 2 MG/ML IJ SOLN: 1.0000 mg | INTRAMUSCULAR | Status: DC | PRN

## 2017-09-12 MED ORDER — MORPHINE SULFATE (PF) 2 MG/ML IV SOLN
2.0000 mg | INTRAVENOUS | Status: DC | PRN
  Administered 2017-09-12 – 2017-09-13 (×6): 2 mg via INTRAVENOUS
  Filled 2017-09-12 (×6): qty 1

## 2017-09-12 NOTE — Progress Notes (Signed)
Pt now on comfort measures. Received order for MedSurg status. Pt transferred to Unit 300, room 308. PRN morphine dose given before transfer. Pt's family updated.   Genelle Balameron D Soila Printup, RN

## 2017-09-12 NOTE — Progress Notes (Signed)
[facility]  Patient with similar CXR but worsening respiratory status and AF RVR not controlled on cardizem. Looks uncomfortable  Discussed with daughter and offered another CT chest if she wanted to see if chest tube possible. They want to make the patient comfortable and no further procedures.  Will discuss with Dr. Arbutus Leasat.  Algis GreenhouseLindsay Venera Privott, MD [facility] [address] [place] [id number] [phone] (office)

## 2017-09-12 NOTE — Progress Notes (Signed)
PT Cancellation Note  Patient Details Name: Lisa GamblerDorothy W Schlauch MRN: [id number] DOB: 09/19/1927   Cancelled Treatment:    Reason Eval/Treat Not Completed: Medical issues which prohibited therapy.  Patient transferred to a higher level of care and will need new PT consult resume therapy when patient is medically stable.  Plan: patient discharged from physical therapy to care of nursing staff.   11:08 AM,  Ocie BobJames Bijou Easler, MPT Physical Therapist with [facility] [phone] office [phone] mobile phone

## 2017-09-12 NOTE — Progress Notes (Signed)
PROGRESS NOTE  [name] RUE:[id number] DOB: Mar 08, 1928 DOA: 2017/09/06 PCP: Lenell Antu, DO  Brief History:  84 year old female with history significant for CKD stage III, GERD, hypertension, paroxysmal atrial fibrillation on Xarelto, and peripheral vascular disease who presented to the emergency with worsening cough and shortness of breath over the last 2-3 weeks. She has been diagnosed with sepsis secondary to multilobular pneumonia and was initially thought to have some pneumomediastinum, but this appears to have resolved on repeat CT chest with gastrografin on 2/18. The patient's hospital stay complicated with acute on chronic CHF.The patient was started on intravenous furosemide with good clinical effect initially. Her respiratory status improved, and she was moved to the medical floor. Unfortunately, she remained hypoxemic requiring high flow nasal cannula. Repeat chest x-ray showed right apical pneumothorax. CT of the chest was obtained and it showed pneumomediastinum and small bilateral pneumothoraces. General surgery was consulted.  Thoracic surgery was also contacted and recommended nonoperative approach and close monitoring.  Unfortunately, the patient developed acute on chronic renal failure secondary to contrast nephropathy.  Her respiratory status remains tenuous requiring high flow nasal cannula up to 15 L.  She subsequently developed atrial fibrillation with RVR.  She was transferred back to the stepdown unit.  She remained in rapid ventricular response despite a diltiazem drip and digoxin.  The patient's respiratory status continued to decline.  Despite optimal therapy, the patient's overall medical condition continued to decline.  I had multiple family meetings with the patient's daughters regarding the patient's overall poor prognosis and medical decline.  Ultimately, the  patient's family felt it was in the patient's best interest to transition her focus of care to  focus on her comfort.  Medications with curative intent were discontinued.  Further lab draws and x-rays were discontinued.    Assessment/Plan: acute hypoxemic resp failure: in the setting of multifocal PNA, RAD and now acute on chronic diastolic CHF  -Completedabx's on 2/25. -Secondary to pneumothorax, pneumomediastinum, lung scarring -still requiringHFNCat 12 L-->now up to 15L on 3/2 with more dyspnea -pt did not tolerate BiPAP well -Continue duonebs, continue Pulmicort, and steroids for acute RAD and potential underlying non-diagnosed COPD  -3/1Repeat chest x-ray--personally reviewed small right apical pneumothorax -continueIV solumedrol 40 mg-->decrease to once daily -NEG 15 lbs for the admission -holdPO lasix due to AKI -appreciate Palliative care consult; Patient now full DNR -09/11/17--repeat CXR--unchanged pneumothorax and pneumomediastinum.  Pulmonary vascular congestion. -I had multiple family meetings with the patient's daughters regarding the patient's overall poor prognosis and medical decline.  Ultimately, the  patient's family felt it was in the patient's best interest to transition her focus of care to focus on her comfort.  Medications with curative intent were discontinued.  Further lab draws and x-rays were discontinued.  Lobar pneumonia -finished 7 days merrem on 2/25  Pneumothorax and pneumomediastinum -09/08/2017 CT chest--soft tissue emphysema in the lower neck and chest with pneumomediastinum and small biapical pneumothoraces. -case discussed with Dr. Henreitta Leber who discussed with TCTS, Dr. Mar Daring likely from a bleb that has ruptured and her lungs are so poorly compliant that they are sticking to the pleural; low suspicion for any esophageal leak or bronchial tree leak, suggests watching and monitoring -3/1Repeat chest x-ray--personally reviewed small right apical pneumothorax -I had multiple family meetings with the patient's daughters regarding the  patient's overall poor prognosis and medical decline.  Ultimately, the  patient's family felt it was in the patient's best interest  to transition her focus of care to focus on her comfort.  Medications with curative intent were discontinued.  Further lab draws and x-rays were discontinued.  Acute on chronic renal failure--CKD 3 -due to contrast nephropathy and hemodynamic changes from atrial fibrillation and soft blood pressures -had CT chest 09/08/17 -baseline creatinine 0.8-1.0 -serum creatinine peaked 2.95 -am BMP  Atrial fibrillation with RVR -Continue Lopressor andamio -CHADsVASC score 4 -continue xarelto -developed RVR am 09/11/17 -09/11/17 transfer to SDU -start dilitazem drip  acute on chronic diastolic HF -hold lasix due to AKI -clinically euvolemic on exam -BNP 626>>>80 -Patient reporting good urine output and currently without any signs offluid overload on exam. -Echo demonstrated preserved ejection fraction and grade 1 diastolic dysfunction. -09/01/2017 echo EF 60-65%, grade 1 DD, mild TR, PASP 30  elevated troponin -in the setting of demand ischemia  -echo reassuring with preserved ejection fraction and w/o wall motion abnormalities  -No acute ischemic changes appreciated on telemetry or EKG. -Patient remains chest pain-free.  hypokalemia -repeat as needed  GERD -continue PPI  HSV gingivostomatitis -valtrex 1 gram bid x 2 doses -improving  Dysphagia -Dys 2 diet with thin liquids  Goals of Care -DNR -I had multiple family meetings with the patient's daughters regarding the patient's overall poor prognosis and medical decline.  Ultimately, the  patient's family felt it was in the patient's best interest to transition her focus of care to focus on her comfort.  Medications with curative intent were discontinued.  Further lab draws and x-rays were discontinued.   Disposition Plan:   Expected hospital death Family Communication:   Daughters updated at  bedside--Total time spent 35 minutes.  Greater than 50% spent face to face counseling and coordinating care.   Code Status:  FULL COMFORT  DVT Prophylaxis:  FULL COMFORT   Procedures: As Listed in Progress Note Above  Antibiotics:  Ceftriaxone and Zithromax 2/17>>2/17  Meropenem 2/17>>2/25       Subjective: The patient remains short of breath.  She denies any chest pain.  She states that she feels tired.  Denies any fevers, chills, headache, abdominal pain.  Objective: Vitals:    0530  0600  0630  0752  BP: 91/65 92/65 95/69    Pulse: (!) 55 (!) 139 65   Resp: (!) 26 (!) 25 (!) 27   Temp:    (!) 96.8 F (36 C)  TempSrc:    Axillary  SpO2: 94% 94% 93% 92%  Weight:      Height:        Intake/Output Summary (Last 24 hours) at  1117 Last data filed at  0500 Gross per 24 hour  Intake 334.29 ml  Output 350 ml  Net -15.71 ml   Weight change: -1 kg (-3.3 oz) Exam:   General:  Pt is alert, follows commands appropriately  HEENT: No icterus, No thrush, No neck mass, [place]/AT  Cardiovascular: IRRR, S1/S2, no rubs, no gallops  Respiratory: Bibasilar crackles.  No wheezing  Abdomen: Soft/+BS, non tender, non distended, no guarding  Extremities: No edema, No lymphangitis, No petechiae, No rashes, no synovitis   Data Reviewed: I have personally reviewed following labs and imaging studies Basic Metabolic Panel: Recent Labs  Lab 09/08/17 1259 09/09/17 0528 09/10/17 0952 09/11/17 0604  0432  NA 138 140 141 140 140  K 3.2* 3.6 3.6 3.7 4.5  CL 94* 95* 97* 98* 98*  CO2 30 30 29 27 23   GLUCOSE 137* 161* 187* 139* 184*  BUN 55* 64* 98*  109* 147*  CREATININE 0.97 1.15* 2.12* 2.02* 2.95*  CALCIUM 8.6* 8.8* 8.6* 8.9 8.6*  MG  --   --  2.7*  --  3.5*  PHOS  --   --   --   --  7.0*   Liver Function Tests: No results for input(s): AST, ALT, ALKPHOS, BILITOT, PROT, ALBUMIN in the last 168 hours. No results for  input(s): LIPASE, AMYLASE in the last 168 hours. No results for input(s): AMMONIA in the last 168 hours. Coagulation Profile: No results for input(s): INR, PROTIME in the last 168 hours. CBC: Recent Labs  Lab 09/06/17 0547 09/08/17 1259 09/09/17 0528 09/10/17 0952  0432  WBC 12.5* 17.5* 17.4* 13.1* 14.7*  HGB 13.4 15.3* 15.6* 15.1* 16.4*  HCT 41.9 46.9* 47.2* 46.7* 50.7*  MCV 91.1 90.0 88.4 89.8 90.1  PLT 193 242 250 185 206   Cardiac Enzymes: No results for input(s): CKTOTAL, CKMB, CKMBINDEX, TROPONINI in the last 168 hours. BNP: Invalid input(s): POCBNP CBG: Recent Labs  Lab 09/09/17 0730 09/09/17 1110 09/10/17 0728 09/11/17 0757  0752  GLUCAP 165* 212* 178* 147* 190*   HbA1C: No results for input(s): HGBA1C in the last 72 hours. Urine analysis:    Component Value Date/Time   COLORURINE YELLOW 08/28/2017 1451   APPEARANCEUR HAZY (A) 09/06/2017 1451   LABSPEC 1.031 (H) 08/20/2017 1451   PHURINE 5.0 08/19/2017 1451   GLUCOSEU NEGATIVE 09/05/2017 1451   HGBUR NEGATIVE 08/24/2017 1451   BILIRUBINUR NEGATIVE 08/30/2017 1451   KETONESUR 5 (A) 08/26/2017 1451   PROTEINUR 30 (A) 08/28/2017 1451   UROBILINOGEN 0.2 10/25/2012 1303   NITRITE NEGATIVE 08/20/2017 1451   LEUKOCYTESUR NEGATIVE 08/20/2017 1451   Sepsis Labs: @LABRCNTIP (procalcitonin:4,lacticidven:4) )No results found for this or any previous visit (from the past 240 hour(s)).   Scheduled Meds: . budesonide (PULMICORT) nebulizer solution  0.5 mg Nebulization BID  . ipratropium-albuterol  3 mL Nebulization TID  . LORazepam  1 mg Intravenous Once  .  morphine injection  2 mg Intravenous Once   Continuous Infusions: . diltiazem (CARDIZEM) infusion 7.5 mg/hr ( 0907)    Procedures/Studies: Ct Soft Tissue Neck W Contrast  Result Date: 09/08/2017 CLINICAL DATA:  Follow-up examination for pneumothorax. Patient with known right-sided pneumothorax. EXAM: CT NECK WITH CONTRAST  TECHNIQUE: Multidetector CT imaging of the neck was performed using the standard protocol following the bolus administration of intravenous contrast. CONTRAST:  70mL ISOVUE-300 IOPAMIDOL (ISOVUE-300) INJECTION 61% COMPARISON:  Prior radiograph from 08/25/2017. FINDINGS: Pharynx and larynx: Oral cavity within normal limits without mass lesion or loculated fluid collection. Extensive streak artifact from dental amalgam. No acute inflammatory changes about the dentition. Oropharynx within normal limits. Parapharyngeal fat preserved. Nasopharynx normal. No retropharyngeal collection or edema. Few small subcentimeter nodular densities seen along the anterior margin of the epiglottis, indeterminate, but could reflect small polyps (series 12, image 40). Remainder of the hypopharynx and supraglottic larynx without acute abnormality. True cords symmetric and normal. Subglottic airway tortuous but widely patent. Salivary glands: Parotid and submandibular glands within normal limits. Tiny calcification at the inferior aspect of the right submandibular gland favored to be vascular in nature. Thyroid: Thyroid within normal limits. Lymph nodes: No adenopathy within the neck. Vascular: Normal intravascular enhancement seen throughout the neck. Atherosclerosis noted. Limited intracranial: Unremarkable. Visualized orbits: Visualized globes and orbital soft tissues within normal limits. Patient status post cataract extraction bilaterally. Mastoids and visualized paranasal sinuses: Chronic sphenoid sinusitis noted. Visualized paranasal sinuses are otherwise largely clear. Visualized mastoids and  middle ear cavities are clear. Skeleton: No acute osseous abnormality. No worrisome lytic or blastic osseous lesions. Patient status post cervical fusion at C3 through C7. Multilevel facet arthropathy noted. Prominent degenerative thickening noted at the tectorial membrane. Upper chest: Pneumomediastinum noted within the visualized upper  mediastinum. Right-sided pneumothorax, incompletely visualized. Associated soft tissue emphysema within the bilateral anterolateral chest wall. Diffuse ground-glass opacity with interlobular septal thickening within the visualized lungs, suggesting pulmonary edema/CHF. Superimposed infection not excluded. Other: Gas lucency present within the right carotid space, extending inferiorly from known right apical pneumothorax. Associated soft tissue emphysema within the visualized anterolateral chest wall bilaterally. IMPRESSION: 1. Right-sided pneumothorax, incompletely visualized. Associated soft tissue emphysema within the anterolateral chest wall bilaterally, extending superiorly within the carotid space of the right neck. Etiology unclear. 2. Extensive ground-glass opacity with interlobular septal thickening within the visualized lungs, suggesting pulmonary edema/CHF. Findings better evaluated on concomitant CT of the chest. Superimposed infection would be difficult to exclude. 3. No other acute abnormality within the neck. 4. Few subcentimeter nodular densities along the anterior margin of the epiglottis as above, indeterminate, but could reflect a few small polyps. Correlation with physical exam/direct visualization recommended. 5. Chronic sphenoid sinusitis. Electronically Signed   By: Rise Mu M.D.   On: 09/08/2017 18:18   Ct Chest Wo Contrast  Result Date: 08/30/2017 CLINICAL DATA:  84 year old female with question esophageal injury with possible pneumomediastinum identified on 09/14/2017 CT. Patient given oral contrast evaluate for extraluminal contrast/esophageal injury. History of neck surgery in 05/2017. Also with cough and lethargy. EXAM: CT CHEST WITHOUT CONTRAST TECHNIQUE: Multidetector CT imaging of the chest was performed following the standard protocol without IV contrast. COMPARISON:  14-Sep-2017 chest CT and prior studies FINDINGS: Cardiovascular: Cardiomegaly and coronary  artery/thoracic aortic atherosclerotic calcifications again noted. Ectatic ascending thoracic aorta measuring 3.8 cm again noted. No pericardial effusion. Mediastinum/Nodes: Oral contrast within the mid and distal esophagus and proximal stomach identified. There is no evidence of extraluminal contrast or mediastinal air on today's study. Shotty mediastinal and bilateral hilar nodes are again noted. Lungs/Pleura: Diffuse ground-glass, interstitial and airspace opacities within both lungs are again noted appear slightly increased. Small bilateral pleural effusions are again noted, left-greater-than-right. There is no evidence of pneumothorax. Upper Abdomen: No acute abnormality. A small hiatal hernia is noted. Musculoskeletal: No acute or suspicious abnormalities. IMPRESSION: 1. No evidence of extraluminal oral contrast or mediastinal air on today's study. 2. Diffuse ground-glass, interstitial and airspace opacities bilaterally which appear slightly increased. This is nonspecific but may represent diffuse edema and/or infection. 3. Small bilateral pleural effusions, cardiomegaly and small hiatal hernia again noted. 4. Ectatic ascending thoracic aorta again noted measuring 3.8 cm. 5. Coronary artery and aortic Atherosclerosis (ICD10-I70.0). Electronically Signed   By: Harmon Pier M.D.   On: 08/30/2017 10:38   Ct Chest W Contrast  Result Date: 09/08/2017 CLINICAL DATA:  Shortness of breath with cough for 2 or 3 weeks. History of hypertension, reflux and chronic kidney disease. Abnormal chest x-ray today demonstrating subcutaneous emphysema, small right pneumothorax and pneumomediastinum. EXAM: CT CHEST WITH CONTRAST TECHNIQUE: Multidetector CT imaging of the chest was performed during intravenous contrast administration. CONTRAST:  70mL ISOVUE-300 IOPAMIDOL (ISOVUE-300) INJECTION 61% COMPARISON:  Radiographs 09/08/2017 and 09/04/2017. CT September 14, 2017 and 08/30/2017. FINDINGS: Cardiovascular: Diffuse atherosclerosis of  the aorta, great vessels and coronary arteries. There is stable mild ectasia of the ascending aorta. The pulmonary arteries are normally opacified. Stable mild cardiomegaly. No pericardial effusion. Mediastinum/Nodes: There are no enlarged mediastinal, hilar  or axillary lymph nodes.There is diffuse pneumomediastinum. There is no evidence mediastinal fluid collection, focal inflammation or esophageal abnormality. The trachea and thyroid gland appear unremarkable. Lungs/Pleura: Small biapical pneumothoraces. No significant pleural effusion. There are diffuse ground-glass and airspace opacities in both lungs which have mildly improved over the last 10 days. Areas consolidation remain in the right middle lobe. There is associated architectural distortion and mild bronchiectasis. No suspicious pulmonary nodule. Upper abdomen: The visualized upper abdomen has a stable appearance without acute findings. There is a small hepatic cyst. No abnormal air collections are seen in the upper abdomen. Musculoskeletal/Chest wall: There is no chest wall mass or suspicious osseous finding. There are degenerative changes throughout the thoracic spine status post cervical fusion. There is soft tissue emphysema in the lower neck and upper chest bilaterally. No focal fluid collection. Neck findings dictated separately. IMPRESSION: 1. CT confirms the presence of extensive soft tissue emphysema in the lower neck and chest with pneumomediastinum and small biapical pneumothoraces. 2. No mediastinal fluid collection or focal esophageal abnormality identified. 3. The diffuse ground-glass and airspace opacities throughout both lungs have mildly improved over the last 10 days. 4.  Aortic Atherosclerosis (ICD10-I70.0). 5. Neck findings are dictated separately. 6. These results were called by telephone at the time of interpretation on 09/08/2017 at 6:12 pm to Dr. Algis Greenhouse , who verbally acknowledged these results. Electronically Signed   By:  Carey Bullocks M.D.   On: 09/08/2017 18:12   Ct Angio Chest Pe W Or Wo Contrast  Result Date: 08/14/2017 CLINICAL DATA:  Bronchitis 2-3 weeks with completion of antibiotics and continued cough and lethargy. EXAM: CT ANGIOGRAPHY CHEST WITH CONTRAST TECHNIQUE: Multidetector CT imaging of the chest was performed using the standard protocol during bolus administration of intravenous contrast. Multiplanar CT image reconstructions and MIPs were obtained to evaluate the vascular anatomy. CONTRAST:  80mL ISOVUE-370 IOPAMIDOL (ISOVUE-370) INJECTION 76% COMPARISON:  Chest x-ray 08/16/2017 and 01/25/2017 as well as abdominal CT 03/12/2008 FINDINGS: Cardiovascular: Borderline cardiomegaly. Minimal calcified plaque over the left anterior descending and lateral circumflex coronary arteries. Calcified plaque over the thoracic aorta. There is mild ectasia of the ascending thoracic aorta measuring 3.8 cm in AP diameter. No evidence of dissection. Pulmonary arterial system is well opacified without evidence of emboli. Mediastinum/Nodes: No evidence of mediastinal or hilar adenopathy. Suggestion of a small hiatal hernia. Small collection of air adjacent the left inferior heart border and few tiny foci of air adjacent the distal esophagus and gastroesophageal junction likely minimal pneumomediastinum. Lungs/Pleura: Lungs are somewhat hypoinflated demonstrate heterogeneous bilateral mixed interstitial airspace process. Small bilateral pleural effusions left greater than right. No cavitary lesion identified. Several air bronchograms. Airways are otherwise within normal. Upper Abdomen: 1.5 cm cyst over the anterior segment right lobe of the liver. Musculoskeletal: Degenerative change of the spine. Review of the MIP images confirms the above findings. IMPRESSION: No evidence of pulmonary embolism. Bilateral patchy heterogeneous mixed interstitial airspace process with small bilateral pleural effusions with left greater than right.  Findings likely due to diffuse multifocal pneumonia. Findings suggesting minimal pneumomediastinum as this may be secondary to esophageal injury given a few small foci of air adjacent the esophagus. Mild cardiomegaly.  Mild atherosclerotic coronary artery disease. Ectasia of the ascending thoracic aorta measuring 3.8 cm in AP diameter. Recommend annual imaging followup by CTA or MRA. This recommendation follows 2010 ACCF/AHA/AATS/ACR/ASA/SCA/SCAI/SIR/STS/SVM Guidelines for the Diagnosis and Management of Patients with Thoracic Aortic Disease. Circulation.2010; 121: Z610-R604. 1.5 cm liver cyst. Aortic Atherosclerosis (ICD10-I70.0).  Critical Value/emergent results were called by telephone at the time of interpretation on 09/14/2017 at 2:33 pm to Dr. Effie Shy, who verbally acknowledged these results. Electronically Signed   By: Elberta Fortis M.D.   On: 09/14/2017 14:33   Dg Chest Port 1 View  Result Date:  CLINICAL DATA:  History of pneumothorax and atrial fibrillation. EXAM: PORTABLE CHEST 1 VIEW COMPARISON:  09/11/2017; 09/10/2017; 09/08/2017; chest CT-09/08/2017 FINDINGS: Grossly unchanged cardiac silhouette and mediastinal contours with atherosclerotic plaque within a tortuous and potentially mildly ectatic thoracic aorta. Grossly unchanged small amount of pneumomediastinum and tiny biapical pneumothoraces with associated subcutaneous emphysema. No mediastinal shift. Redemonstrated interstitial thickening and perihilar/medial basilar heterogeneous/consolidative opacities. Pulmonary vasculature appears slightly less distinct on the present examination. Trace pleural effusions are not excluded. No acute osseus abnormalities. Post lower cervical ACDF, incompletely evaluated. IMPRESSION: 1. Grossly unchanged small amount of pneumomediastinum, tiny biapical pneumothoraces and bilateral subcutaneous emphysema. 2. Suspected mild pulmonary edema. 3. Grossly unchanged perihilar and bilateral medial basilar  opacities, likely atelectasis. Electronically Signed   By: Simonne Come M.D.   On:  09:21   Dg Chest Port 1 View  Result Date: 09/11/2017 CLINICAL DATA:  Worsening dyspnea, prior right pneumothorax EXAM: PORTABLE CHEST 1 VIEW COMPARISON:  09/10/2017 FINDINGS: Trace right pneumothorax. Trace left pneumothorax is difficult to exclude. Known trace pneumomediastinum is not well visualized on the current study. Mild subcutaneous emphysema along the right greater than left chest wall. Chronic interstitial lung disease. Mild bibasilar atelectasis. No pleural effusion. The heart is normal in size. IMPRESSION: Trace right pneumothorax with associated mild subcutaneous emphysema. Possible trace left pneumothorax with associated mild subcutaneous emphysema. Known trace pneumomediastinum is not well visualized on the current study. Electronically Signed   By: Charline Bills M.D.   On: 09/11/2017 15:00   Dg Chest Port 1 View  Result Date: 09/10/2017 CLINICAL DATA:  Follow-up pneumothorax. EXAM: PORTABLE CHEST 1 VIEW COMPARISON:  Chest radiograph 09/09/2017, CT 09/08/2017 FINDINGS: Again demonstrated small volume pneumomediastinum and RIGHT apical pneumothorax. Extensive subcutaneous gas on LEFT and RIGHT chest wall unchanged. Underlying diffuse airspace disease similar to slightly improved. Anterior cervical fusion. IMPRESSION: 1. No change in small volume pneumomediastinum and RIGHT apical pneumothorax. 2. Stable subcutaneous emphysema. 3. Slight improvement in pulmonary edema pattern. Electronically Signed   By: Genevive Bi M.D.   On: 09/10/2017 08:06   Dg Chest Port 1 View  Result Date: 09/09/2017 CLINICAL DATA:  Right apical pneumothorax. EXAM: PORTABLE CHEST 1 VIEW COMPARISON:  CT 09/08/2017.  Chest x-ray 09/08/2017, 09/04/2017. FINDINGS: Persistent pneumomediastinum and small apical pneumothoraces again noted. Persistent bilateral chest wall subcutaneous emphysema. Heart size stable. Stable  aortic ectasia. Stable diffuse interstitial changes are again noted. No pleural effusion. No acute bony abnormality identified. Prior cervical spine fusion. Degenerative changes and scoliosis thoracolumbar spine. IMPRESSION: 1. Stable pneumomediastinum and tiny apical pneumothoraces. No evidence of progression. 2.  Stable diffuse bilateral interstitial prominence. Electronically Signed   By: Maisie Fus  Register   On: 09/09/2017 07:34   Dg Chest Port 1 View  Result Date: 09/08/2017 CLINICAL DATA:  Hypoxia.  Progressive cough. EXAM: PORTABLE CHEST 1 VIEW COMPARISON:  Chest x-rays dated 09/04/2017 and 08/31/2017 and chest CT dated 08/30/2017 FINDINGS: Since the prior exam the patient has developed bilateral subcutaneous emphysema in the axillary regions as well as a tiny right apical pneumothorax. The diffuse bilateral pulmonary infiltrates have improved. Heart size and vascularity are normal. No appreciable effusion. IMPRESSION: 1. New small right apical pneumothorax. 2. New bilateral axillary  subcutaneous emphysema. This indicates that the patient has bilateral air leaks. 3. Improved bilateral pulmonary infiltrates. Critical Value/emergent results were called by telephone at the time of interpretation on 09/08/2017 at 4:24 pm to Chales AbrahamsMary Ann, RN , who verbally acknowledged these results. Electronically Signed   By: Francene BoyersJames  Maxwell M.D.   On: 09/08/2017 16:26   Dg Chest Port 1 View  Result Date: 09/04/2017 CLINICAL DATA:  Shortness of breath EXAM: PORTABLE CHEST 1 VIEW COMPARISON:  August 31, 2017 FINDINGS: The cardiomediastinal silhouette is stable. Diffuse bilateral pulmonary opacities are unchanged. IMPRESSION: No interval change in diffuse bilateral pulmonary opacities. Electronically Signed   By: Gerome Samavid  Williams III M.D   On: 09/04/2017 09:08   Dg Chest Port 1 View  Result Date: 08/31/2017 CLINICAL DATA:  Hypoxia, shortness of breath. Admitted with pneumonia and sepsis. EXAM: PORTABLE CHEST 1 VIEW  COMPARISON:  CT chest August 30, 2017 and chest radiograph August 29, 2017 FINDINGS: Diffuse interstitial and alveolar airspace opacities, small suspected pleural effusions. Cardiac silhouette is normal in size. Calcified aortic knob. Fullness of the RIGHT hilum. No pneumothorax. ACDF. IMPRESSION: Similar and alveolar airspace opacities concerning for pneumonia. Fullness of the RIGHT hila corresponding to vascular shadows on prior radiograph. Aortic Atherosclerosis (ICD10-I70.0). Electronically Signed   By: Awilda Metroourtnay  Bloomer M.D.   On: 08/31/2017 22:40   Dg Chest Port 1 View  Result Date: Nov 13, 2017 CLINICAL DATA:  Shortness of breath with cough and wheezing as well as weakness. EXAM: PORTABLE CHEST 1 VIEW COMPARISON:  01/25/2017 FINDINGS: Lungs are adequately inflated and demonstrate moderate bilateral patchy mixed interstitial airspace density. Possible cavitary process in the right infrahilar region. No definite effusion. Cardiac silhouette is within normal. Fusion hardware over the cervical spine. Remainder of the exam is unchanged. IMPRESSION: Moderate patchy bilateral mixed interstitial airspace process with possible cavitary process in the right infrahilar region likely due to infection. Recommend follow-up to resolution. Electronically Signed   By: Elberta Fortisaniel  Boyle M.D.   On: 0May 04, 2019 10:44   Dg Chest Port 1v Same Day  Result Date: 09/09/2017 CLINICAL DATA:  Pneumothorax. EXAM: PORTABLE CHEST 1 VIEW COMPARISON:  09/09/2017 FINDINGS: Examination demonstrates the lungs to be adequately inflated with continued hazy bilateral airspace opacification. No definite effusion. Continued evidence of a small right apical pneumothorax without significant change. No definite left-sided pneumothorax visualized. Stable subcutaneous emphysema over the chest. Evidence of mild persistent pneumomediastinum. Cardiomediastinal silhouette and remainder the exam is unchanged. IMPRESSION: Stable mild bilateral hazy  airspace opacification. Stable known tiny right apical pneumothorax. Stable mild pneumomediastinum. Stable subcutaneous emphysema over the chest. Electronically Signed   By: Elberta Fortisaniel  Boyle M.D.   On: 09/09/2017 16:50    Catarina Hartshornavid Chantele Corado, DO  [facility] Pager [phone]  If 7PM-7AM, please contact night-coverage [email or web address] Password [email or web address] , 11:17 AM   LOS: 14 days

## 2017-09-12 NOTE — Progress Notes (Signed)
Patient transported from ICU room 5 to room 308 without any complications.

## 2017-09-12 NOTE — Progress Notes (Signed)
SLP Cancellation Note  Patient Details Name: Lisa GamblerDorothy W Speers MRN: [id number] DOB: 12/07/1927   Cancelled treatment:       Reason Eval/Treat Not Completed: Medical issues which prohibited therapy; Pt transferred to higher level of care and also now on comfort measures. SLP will sign off. Please reconsult if needs arise.  Thank you,  Havery MorosDabney Porter, CCC-SLP [phone]    PORTER,DABNEY , 2:49 PM

## 2017-10-11 NOTE — Death Summary Note (Signed)
DEATH SUMMARY   Patient Details  Name: [name] MRN: [id number] DOB: 08/18/1927  Admission/Discharge Information   Admit Date:  08/27/2017  Date of Death: Date of Death: 2018/03/25  Time of Death: Time of Death: 0353  Length of Stay: 15  Referring Physician: Lenell AntuLe, Thao P, DO   Reason(s) for Hospitalization  Pneumonia  Diagnoses  Preliminary cause of death: acute on chronic CHF and pneumomediastinum Secondary Diagnoses (including complications and co-morbidities):  acute hypoxemic resp failure: in the setting of multifocal PNA, RAD and now acute on chronic diastolic CHF  -Completedabx's on 2/25. -Secondary to pneumothorax, pneumomediastinum, lung scarring -still requiringHFNCat 12 L-->now up to 15L on 3/2 with more dyspnea -pt did not tolerate BiPAP well -Continue duonebs, continue Pulmicort, and steroids for acute RAD and potential underlying non-diagnosed COPD  -3/1Repeat chest x-ray--personally reviewed small right apical pneumothorax -continueIV solumedrol 40 mg-->decrease to once daily -NEG 15 lbs for the admission -holdPO lasix due to AKI -appreciate Palliative care consult; Patient now full DNR -09/11/17--repeat CXR--unchanged pneumothorax and pneumomediastinum.  Pulmonary vascular congestion. -I had multiple family meetings with the patient's daughters regarding the patient's overall poor prognosis and medical decline.  Ultimately, the  patient's family felt it was in the patient's best interest to transition her focus of care to focus on her comfort.  Medications with curative intent were discontinued.  Further lab draws and x-rays were discontinued.  Lobar pneumonia -finished 7 days merrem on 2/25  Pneumothorax and pneumomediastinum -09/08/2017 CT chest--soft tissue emphysema in the lower neck and chest with pneumomediastinum and small biapical pneumothoraces. -case discussed with Dr. Henreitta LeberBridges who discussed with TCTS, Dr. Mar DaringGerhardt-->thinks likely from a bleb  that has ruptured and her lungs are so poorly compliant that they are sticking to the pleural; low suspicion for any esophageal leak or bronchial tree leak, suggests watching and monitoring -3/1Repeat chest x-ray--personally reviewed small right apical pneumothorax -I had multiple family meetings with the patient's daughters regarding the patient's overall poor prognosis and medical decline.  Ultimately, the  patient's family felt it was in the patient's best interest to transition her focus of care to focus on her comfort.  Medications with curative intent were discontinued.  Further lab draws and x-rays were discontinued.  Acute on chronic renal failure--CKD 3 -due to contrast nephropathy and hemodynamic changes from atrial fibrillation and soft blood pressures -had CT chest 09/08/17 -baseline creatinine 0.8-1.0 -serum creatinine peaked 2.95 -am BMP  Atrial fibrillationwith RVR -Continue Lopressor andamio -CHADsVASC score 4 -continue xarelto -developed RVR am 09/11/17 -09/11/17 transfer to SDU -start dilitazem drip  acute on chronic diastolic HF -hold lasix due to AKI -clinically euvolemic on exam -BNP 626>>>80 -Patient reporting good urine output and currently without any signs offluid overload on exam. -Echo demonstrated preserved ejection fraction and grade 1 diastolic dysfunction. -09/01/2017 echo EF 60-65%, grade 1 DD, mild TR, PASP 30  elevated troponin -in the setting of demand ischemia  -echo reassuring with preserved ejection fraction and w/o wall motion abnormalities  -No acute ischemic changes appreciated on telemetry or EKG. -Patient remains chest pain-free.  hypokalemia -repeat as needed  GERD -continue PPI  HSV gingivostomatitis -valtrex 1 gram bid x 2 doses -improving  Dysphagia -Dys 2 diet with thin liquids  Goals of Care -DNR -I had multiple family meetings with the patient's daughters regarding the patient's overall poor prognosis and  medical decline.  Ultimately, the  patient's family felt it was in the patient's best interest to transition her focus of care  to focus on her comfort.  Medications with curative intent were discontinued.  Further lab draws and x-rays were discontinued.     Brief Hospital Course (including significant findings, care, treatment, and services provided and events leading to death)  [name] is a 84 y.o. year old female  significant for CKD stage III, GERD, hypertension, paroxysmal atrial fibrillation on Xarelto, and peripheral vascular disease who presented to the emergency with worsening cough and shortness of breath over the last 2-3 weeks. She has been diagnosed with sepsis secondary to multilobular pneumonia and was initially thought to have some pneumomediastinum, but this appears to have resolved on repeat CT chest with gastrografin on 2/18. The patient's hospital stay complicated with acute on chronic CHF.The patient was started on intravenous furosemide with good clinical effect initially. Her respiratory status improved, and she was moved to the medical floor. Unfortunately, she remained hypoxemic requiring high flow nasal cannula. Repeat chest x-ray showed right apical pneumothorax. CT of the chest was obtained and it showed pneumomediastinum and small bilateral pneumothoraces. General surgery was consulted.  Thoracic surgery was also contacted and recommended nonoperative approach and close monitoring.  Unfortunately, the patient developed acute on chronic renal failure secondary to contrast nephropathy.  Her respiratory status remains tenuous requiring high flow nasal cannula up to 15 L.  She subsequently developed atrial fibrillation with RVR.  She was transferred back to the stepdown unit.  She remained in rapid ventricular response despite a diltiazem drip and digoxin.  The patient's respiratory status continued to decline.  Despite optimal therapy, the patient's overall medical  condition continued to decline.  I had multiple family meetings with the patient's daughters regarding the patient's overall poor prognosis and medical decline.  Ultimately, the  patient's family felt it was in the patient's best interest to transition her focus of care to focus on her comfort.  Medications with curative intent were discontinued.  Further lab draws and x-rays were discontinued.      Pertinent Labs and Studies  Significant Diagnostic Studies Ct Soft Tissue Neck W Contrast  Result Date: 09/08/2017 CLINICAL DATA:  Follow-up examination for pneumothorax. Patient with known right-sided pneumothorax. EXAM: CT NECK WITH CONTRAST TECHNIQUE: Multidetector CT imaging of the neck was performed using the standard protocol following the bolus administration of intravenous contrast. CONTRAST:  70mL ISOVUE-300 IOPAMIDOL (ISOVUE-300) INJECTION 61% COMPARISON:  Prior radiograph from 08/25/2017. FINDINGS: Pharynx and larynx: Oral cavity within normal limits without mass lesion or loculated fluid collection. Extensive streak artifact from dental amalgam. No acute inflammatory changes about the dentition. Oropharynx within normal limits. Parapharyngeal fat preserved. Nasopharynx normal. No retropharyngeal collection or edema. Few small subcentimeter nodular densities seen along the anterior margin of the epiglottis, indeterminate, but could reflect small polyps (series 12, image 40). Remainder of the hypopharynx and supraglottic larynx without acute abnormality. True cords symmetric and normal. Subglottic airway tortuous but widely patent. Salivary glands: Parotid and submandibular glands within normal limits. Tiny calcification at the inferior aspect of the right submandibular gland favored to be vascular in nature. Thyroid: Thyroid within normal limits. Lymph nodes: No adenopathy within the neck. Vascular: Normal intravascular enhancement seen throughout the neck. Atherosclerosis noted. Limited  intracranial: Unremarkable. Visualized orbits: Visualized globes and orbital soft tissues within normal limits. Patient status post cataract extraction bilaterally. Mastoids and visualized paranasal sinuses: Chronic sphenoid sinusitis noted. Visualized paranasal sinuses are otherwise largely clear. Visualized mastoids and middle ear cavities are clear. Skeleton: No acute osseous abnormality. No worrisome lytic or blastic  osseous lesions. Patient status post cervical fusion at C3 through C7. Multilevel facet arthropathy noted. Prominent degenerative thickening noted at the tectorial membrane. Upper chest: Pneumomediastinum noted within the visualized upper mediastinum. Right-sided pneumothorax, incompletely visualized. Associated soft tissue emphysema within the bilateral anterolateral chest wall. Diffuse ground-glass opacity with interlobular septal thickening within the visualized lungs, suggesting pulmonary edema/CHF. Superimposed infection not excluded. Other: Gas lucency present within the right carotid space, extending inferiorly from known right apical pneumothorax. Associated soft tissue emphysema within the visualized anterolateral chest wall bilaterally. IMPRESSION: 1. Right-sided pneumothorax, incompletely visualized. Associated soft tissue emphysema within the anterolateral chest wall bilaterally, extending superiorly within the carotid space of the right neck. Etiology unclear. 2. Extensive ground-glass opacity with interlobular septal thickening within the visualized lungs, suggesting pulmonary edema/CHF. Findings better evaluated on concomitant CT of the chest. Superimposed infection would be difficult to exclude. 3. No other acute abnormality within the neck. 4. Few subcentimeter nodular densities along the anterior margin of the epiglottis as above, indeterminate, but could reflect a few small polyps. Correlation with physical exam/direct visualization recommended. 5. Chronic sphenoid sinusitis.  Electronically Signed   By: Rise Mu M.D.   On: 09/08/2017 18:18   Ct Chest Wo Contrast  Result Date: 08/30/2017 CLINICAL DATA:  84 year old female with question esophageal injury with possible pneumomediastinum identified on 09-19-17 CT. Patient given oral contrast evaluate for extraluminal contrast/esophageal injury. History of neck surgery in 05/2017. Also with cough and lethargy. EXAM: CT CHEST WITHOUT CONTRAST TECHNIQUE: Multidetector CT imaging of the chest was performed following the standard protocol without IV contrast. COMPARISON:  09-19-17 chest CT and prior studies FINDINGS: Cardiovascular: Cardiomegaly and coronary artery/thoracic aortic atherosclerotic calcifications again noted. Ectatic ascending thoracic aorta measuring 3.8 cm again noted. No pericardial effusion. Mediastinum/Nodes: Oral contrast within the mid and distal esophagus and proximal stomach identified. There is no evidence of extraluminal contrast or mediastinal air on today's study. Shotty mediastinal and bilateral hilar nodes are again noted. Lungs/Pleura: Diffuse ground-glass, interstitial and airspace opacities within both lungs are again noted appear slightly increased. Small bilateral pleural effusions are again noted, left-greater-than-right. There is no evidence of pneumothorax. Upper Abdomen: No acute abnormality. A small hiatal hernia is noted. Musculoskeletal: No acute or suspicious abnormalities. IMPRESSION: 1. No evidence of extraluminal oral contrast or mediastinal air on today's study. 2. Diffuse ground-glass, interstitial and airspace opacities bilaterally which appear slightly increased. This is nonspecific but may represent diffuse edema and/or infection. 3. Small bilateral pleural effusions, cardiomegaly and small hiatal hernia again noted. 4. Ectatic ascending thoracic aorta again noted measuring 3.8 cm. 5. Coronary artery and aortic Atherosclerosis (ICD10-I70.0). Electronically Signed   By: Harmon Pier M.D.   On: 08/30/2017 10:38   Ct Chest W Contrast  Result Date: 09/08/2017 CLINICAL DATA:  Shortness of breath with cough for 2 or 3 weeks. History of hypertension, reflux and chronic kidney disease. Abnormal chest x-ray today demonstrating subcutaneous emphysema, small right pneumothorax and pneumomediastinum. EXAM: CT CHEST WITH CONTRAST TECHNIQUE: Multidetector CT imaging of the chest was performed during intravenous contrast administration. CONTRAST:  70mL ISOVUE-300 IOPAMIDOL (ISOVUE-300) INJECTION 61% COMPARISON:  Radiographs 09/08/2017 and 09/04/2017. CT 09-19-17 and 08/30/2017. FINDINGS: Cardiovascular: Diffuse atherosclerosis of the aorta, great vessels and coronary arteries. There is stable mild ectasia of the ascending aorta. The pulmonary arteries are normally opacified. Stable mild cardiomegaly. No pericardial effusion. Mediastinum/Nodes: There are no enlarged mediastinal, hilar or axillary lymph nodes.There is diffuse pneumomediastinum. There is no evidence mediastinal fluid collection, focal inflammation  or esophageal abnormality. The trachea and thyroid gland appear unremarkable. Lungs/Pleura: Small biapical pneumothoraces. No significant pleural effusion. There are diffuse ground-glass and airspace opacities in both lungs which have mildly improved over the last 10 days. Areas consolidation remain in the right middle lobe. There is associated architectural distortion and mild bronchiectasis. No suspicious pulmonary nodule. Upper abdomen: The visualized upper abdomen has a stable appearance without acute findings. There is a small hepatic cyst. No abnormal air collections are seen in the upper abdomen. Musculoskeletal/Chest wall: There is no chest wall mass or suspicious osseous finding. There are degenerative changes throughout the thoracic spine status post cervical fusion. There is soft tissue emphysema in the lower neck and upper chest bilaterally. No focal fluid collection. Neck  findings dictated separately. IMPRESSION: 1. CT confirms the presence of extensive soft tissue emphysema in the lower neck and chest with pneumomediastinum and small biapical pneumothoraces. 2. No mediastinal fluid collection or focal esophageal abnormality identified. 3. The diffuse ground-glass and airspace opacities throughout both lungs have mildly improved over the last 10 days. 4.  Aortic Atherosclerosis (ICD10-I70.0). 5. Neck findings are dictated separately. 6. These results were called by telephone at the time of interpretation on 09/08/2017 at 6:12 pm to Dr. Algis Greenhouse , who verbally acknowledged these results. Electronically Signed   By: Carey Bullocks M.D.   On: 09/08/2017 18:12   Ct Angio Chest Pe W Or Wo Contrast  Result Date: 08/16/2017 CLINICAL DATA:  Bronchitis 2-3 weeks with completion of antibiotics and continued cough and lethargy. EXAM: CT ANGIOGRAPHY CHEST WITH CONTRAST TECHNIQUE: Multidetector CT imaging of the chest was performed using the standard protocol during bolus administration of intravenous contrast. Multiplanar CT image reconstructions and MIPs were obtained to evaluate the vascular anatomy. CONTRAST:  80mL ISOVUE-370 IOPAMIDOL (ISOVUE-370) INJECTION 76% COMPARISON:  Chest x-ray 08/28/2017 and 01/25/2017 as well as abdominal CT 03/12/2008 FINDINGS: Cardiovascular: Borderline cardiomegaly. Minimal calcified plaque over the left anterior descending and lateral circumflex coronary arteries. Calcified plaque over the thoracic aorta. There is mild ectasia of the ascending thoracic aorta measuring 3.8 cm in AP diameter. No evidence of dissection. Pulmonary arterial system is well opacified without evidence of emboli. Mediastinum/Nodes: No evidence of mediastinal or hilar adenopathy. Suggestion of a small hiatal hernia. Small collection of air adjacent the left inferior heart border and few tiny foci of air adjacent the distal esophagus and gastroesophageal junction likely minimal  pneumomediastinum. Lungs/Pleura: Lungs are somewhat hypoinflated demonstrate heterogeneous bilateral mixed interstitial airspace process. Small bilateral pleural effusions left greater than right. No cavitary lesion identified. Several air bronchograms. Airways are otherwise within normal. Upper Abdomen: 1.5 cm cyst over the anterior segment right lobe of the liver. Musculoskeletal: Degenerative change of the spine. Review of the MIP images confirms the above findings. IMPRESSION: No evidence of pulmonary embolism. Bilateral patchy heterogeneous mixed interstitial airspace process with small bilateral pleural effusions with left greater than right. Findings likely due to diffuse multifocal pneumonia. Findings suggesting minimal pneumomediastinum as this may be secondary to esophageal injury given a few small foci of air adjacent the esophagus. Mild cardiomegaly.  Mild atherosclerotic coronary artery disease. Ectasia of the ascending thoracic aorta measuring 3.8 cm in AP diameter. Recommend annual imaging followup by CTA or MRA. This recommendation follows 2010 ACCF/AHA/AATS/ACR/ASA/SCA/SCAI/SIR/STS/SVM Guidelines for the Diagnosis and Management of Patients with Thoracic Aortic Disease. Circulation.2010; 121: [id number]. 1.5 cm liver cyst. Aortic Atherosclerosis (ICD10-I70.0). Critical Value/emergent results were called by telephone at the time of interpretation on 08/30/2017 at 2:33  pm to Dr. Effie Shy, who verbally acknowledged these results. Electronically Signed   By: Elberta Fortis M.D.   On: 2017/09/26 14:33   Dg Chest Port 1 View  Result Date:  CLINICAL DATA:  History of pneumothorax and atrial fibrillation. EXAM: PORTABLE CHEST 1 VIEW COMPARISON:  09/11/2017; 09/10/2017; 09/08/2017; chest CT-09/08/2017 FINDINGS: Grossly unchanged cardiac silhouette and mediastinal contours with atherosclerotic plaque within a tortuous and potentially mildly ectatic thoracic aorta. Grossly unchanged small amount of  pneumomediastinum and tiny biapical pneumothoraces with associated subcutaneous emphysema. No mediastinal shift. Redemonstrated interstitial thickening and perihilar/medial basilar heterogeneous/consolidative opacities. Pulmonary vasculature appears slightly less distinct on the present examination. Trace pleural effusions are not excluded. No acute osseus abnormalities. Post lower cervical ACDF, incompletely evaluated. IMPRESSION: 1. Grossly unchanged small amount of pneumomediastinum, tiny biapical pneumothoraces and bilateral subcutaneous emphysema. 2. Suspected mild pulmonary edema. 3. Grossly unchanged perihilar and bilateral medial basilar opacities, likely atelectasis. Electronically Signed   By: Simonne Come M.D.   On:  09:21   Dg Chest Port 1 View  Result Date: 09/11/2017 CLINICAL DATA:  Worsening dyspnea, prior right pneumothorax EXAM: PORTABLE CHEST 1 VIEW COMPARISON:  09/10/2017 FINDINGS: Trace right pneumothorax. Trace left pneumothorax is difficult to exclude. Known trace pneumomediastinum is not well visualized on the current study. Mild subcutaneous emphysema along the right greater than left chest wall. Chronic interstitial lung disease. Mild bibasilar atelectasis. No pleural effusion. The heart is normal in size. IMPRESSION: Trace right pneumothorax with associated mild subcutaneous emphysema. Possible trace left pneumothorax with associated mild subcutaneous emphysema. Known trace pneumomediastinum is not well visualized on the current study. Electronically Signed   By: Charline Bills M.D.   On: 09/11/2017 15:00   Dg Chest Port 1 View  Result Date: 09/10/2017 CLINICAL DATA:  Follow-up pneumothorax. EXAM: PORTABLE CHEST 1 VIEW COMPARISON:  Chest radiograph 09/09/2017, CT 09/08/2017 FINDINGS: Again demonstrated small volume pneumomediastinum and RIGHT apical pneumothorax. Extensive subcutaneous gas on LEFT and RIGHT chest wall unchanged. Underlying diffuse airspace disease similar  to slightly improved. Anterior cervical fusion. IMPRESSION: 1. No change in small volume pneumomediastinum and RIGHT apical pneumothorax. 2. Stable subcutaneous emphysema. 3. Slight improvement in pulmonary edema pattern. Electronically Signed   By: Genevive Bi M.D.   On: 09/10/2017 08:06   Dg Chest Port 1 View  Result Date: 09/09/2017 CLINICAL DATA:  Right apical pneumothorax. EXAM: PORTABLE CHEST 1 VIEW COMPARISON:  CT 09/08/2017.  Chest x-ray 09/08/2017, 09/04/2017. FINDINGS: Persistent pneumomediastinum and small apical pneumothoraces again noted. Persistent bilateral chest wall subcutaneous emphysema. Heart size stable. Stable aortic ectasia. Stable diffuse interstitial changes are again noted. No pleural effusion. No acute bony abnormality identified. Prior cervical spine fusion. Degenerative changes and scoliosis thoracolumbar spine. IMPRESSION: 1. Stable pneumomediastinum and tiny apical pneumothoraces. No evidence of progression. 2.  Stable diffuse bilateral interstitial prominence. Electronically Signed   By: Maisie Fus  Register   On: 09/09/2017 07:34   Dg Chest Port 1 View  Result Date: 09/08/2017 CLINICAL DATA:  Hypoxia.  Progressive cough. EXAM: PORTABLE CHEST 1 VIEW COMPARISON:  Chest x-rays dated 09/04/2017 and 08/31/2017 and chest CT dated 08/30/2017 FINDINGS: Since the prior exam the patient has developed bilateral subcutaneous emphysema in the axillary regions as well as a tiny right apical pneumothorax. The diffuse bilateral pulmonary infiltrates have improved. Heart size and vascularity are normal. No appreciable effusion. IMPRESSION: 1. New small right apical pneumothorax. 2. New bilateral axillary subcutaneous emphysema. This indicates that the patient has bilateral air leaks. 3. Improved bilateral pulmonary infiltrates.  Critical Value/emergent results were called by telephone at the time of interpretation on 09/08/2017 at 4:24 pm to Chales Abrahams, RN , who verbally acknowledged these  results. Electronically Signed   By: Francene Boyers M.D.   On: 09/08/2017 16:26   Dg Chest Port 1 View  Result Date: 09/04/2017 CLINICAL DATA:  Shortness of breath EXAM: PORTABLE CHEST 1 VIEW COMPARISON:  August 31, 2017 FINDINGS: The cardiomediastinal silhouette is stable. Diffuse bilateral pulmonary opacities are unchanged. IMPRESSION: No interval change in diffuse bilateral pulmonary opacities. Electronically Signed   By: Gerome Sam III M.D   On: 09/04/2017 09:08   Dg Chest Port 1 View  Result Date: 08/31/2017 CLINICAL DATA:  Hypoxia, shortness of breath. Admitted with pneumonia and sepsis. EXAM: PORTABLE CHEST 1 VIEW COMPARISON:  CT chest August 30, 2017 and chest radiograph September 14, 2017 FINDINGS: Diffuse interstitial and alveolar airspace opacities, small suspected pleural effusions. Cardiac silhouette is normal in size. Calcified aortic knob. Fullness of the RIGHT hilum. No pneumothorax. ACDF. IMPRESSION: Similar and alveolar airspace opacities concerning for pneumonia. Fullness of the RIGHT hila corresponding to vascular shadows on prior radiograph. Aortic Atherosclerosis (ICD10-I70.0). Electronically Signed   By: Awilda Metro M.D.   On: 08/31/2017 22:40   Dg Chest Port 1 View  Result Date: 09-14-17 CLINICAL DATA:  Shortness of breath with cough and wheezing as well as weakness. EXAM: PORTABLE CHEST 1 VIEW COMPARISON:  01/25/2017 FINDINGS: Lungs are adequately inflated and demonstrate moderate bilateral patchy mixed interstitial airspace density. Possible cavitary process in the right infrahilar region. No definite effusion. Cardiac silhouette is within normal. Fusion hardware over the cervical spine. Remainder of the exam is unchanged. IMPRESSION: Moderate patchy bilateral mixed interstitial airspace process with possible cavitary process in the right infrahilar region likely due to infection. Recommend follow-up to resolution. Electronically Signed   By: Elberta Fortis M.D.    On: 09-14-17 10:44   Dg Chest Port 1v Same Day  Result Date: 09/09/2017 CLINICAL DATA:  Pneumothorax. EXAM: PORTABLE CHEST 1 VIEW COMPARISON:  09/09/2017 FINDINGS: Examination demonstrates the lungs to be adequately inflated with continued hazy bilateral airspace opacification. No definite effusion. Continued evidence of a small right apical pneumothorax without significant change. No definite left-sided pneumothorax visualized. Stable subcutaneous emphysema over the chest. Evidence of mild persistent pneumomediastinum. Cardiomediastinal silhouette and remainder the exam is unchanged. IMPRESSION: Stable mild bilateral hazy airspace opacification. Stable known tiny right apical pneumothorax. Stable mild pneumomediastinum. Stable subcutaneous emphysema over the chest. Electronically Signed   By: Elberta Fortis M.D.   On: 09/09/2017 16:50    Microbiology No results found for this or any previous visit (from the past 240 hour(s)).  Lab Basic Metabolic Panel: No results for input(s): NA, K, CL, CO2, GLUCOSE, BUN, CREATININE, CALCIUM, MG, PHOS in the last 168 hours. Liver Function Tests: No results for input(s): AST, ALT, ALKPHOS, BILITOT, PROT, ALBUMIN in the last 168 hours. No results for input(s): LIPASE, AMYLASE in the last 168 hours. No results for input(s): AMMONIA in the last 168 hours. CBC: No results for input(s): WBC, NEUTROABS, HGB, HCT, MCV, PLT in the last 168 hours. Cardiac Enzymes: No results for input(s): CKTOTAL, CKMB, CKMBINDEX, TROPONINI in the last 168 hours. Sepsis Labs: No results for input(s): PROCALCITON, WBC, LATICACIDVEN in the last 168 hours.  Procedures/Operations  none   Onalee Hua Zamorah Ailes 09/27/2017, 12:10 PM

## 2017-10-11 NOTE — Progress Notes (Signed)
Patient expired at [id number].  Signed death certificate.

## 2017-10-11 DEATH — deceased

## 2017-12-11 ENCOUNTER — Encounter (HOSPITAL_COMMUNITY): Payer: Self-pay | Admitting: Emergency Medicine

## 2018-05-04 IMAGING — CT CT ANGIO CHEST
2 of 6 series · 18 of 46 positions shown · IV contrast (Isovue)
Comparison: Chest x-ray 08/29/2017 and 01/25/2017 as well as
abdominal CT 03/12/2008

CLINICAL DATA: Bronchitis 2-3 weeks with completion of antibiotics
and continued cough and lethargy.

EXAM:
CT ANGIOGRAPHY CHEST WITH CONTRAST
TECHNIQUE: Multidetector CT imaging of the chest was performed using the
standard protocol during bolus administration of intravenous
contrast. Multiplanar CT image reconstructions and MIPs were
obtained to evaluate the vascular anatomy.
CONTRAST:  80mL QYBJ3U-EMM IOPAMIDOL (QYBJ3U-EMM) INJECTION 76%

[Series 5: thins · axial · 0.62mm/px · z∈[+1129,+1372]mm · 15 of 267 slices shown]
[im 12/267  lung]
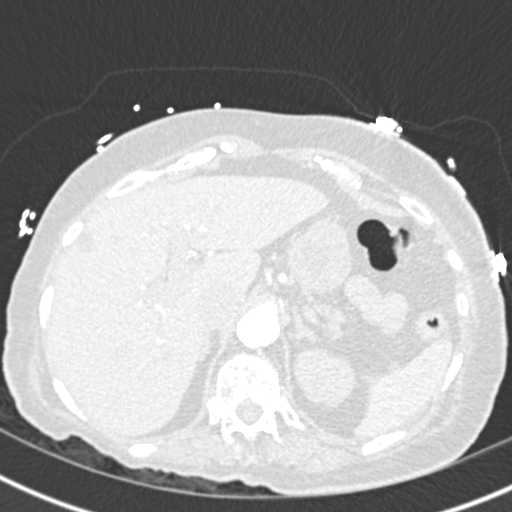
[im 35/267  soft-tissue]
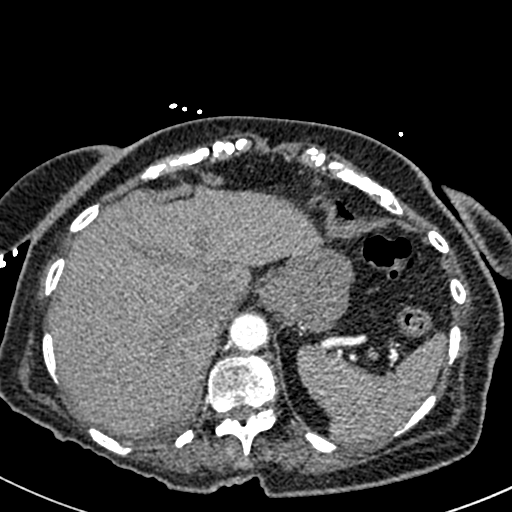
[im 47/267  lung]
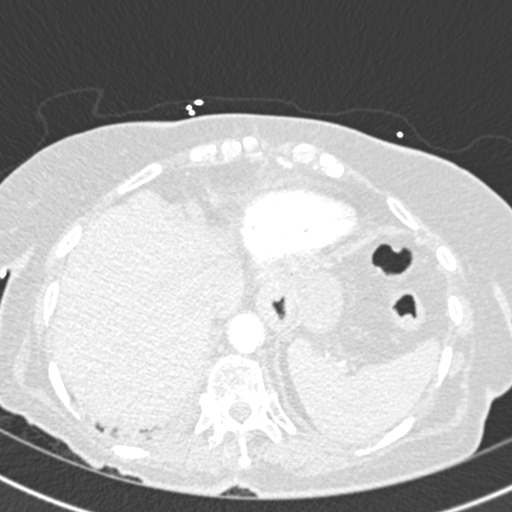
[im 70/267  soft-tissue]
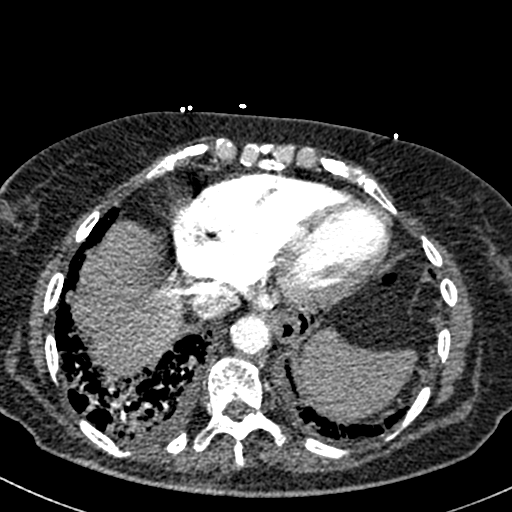
[im 81/267  lung]
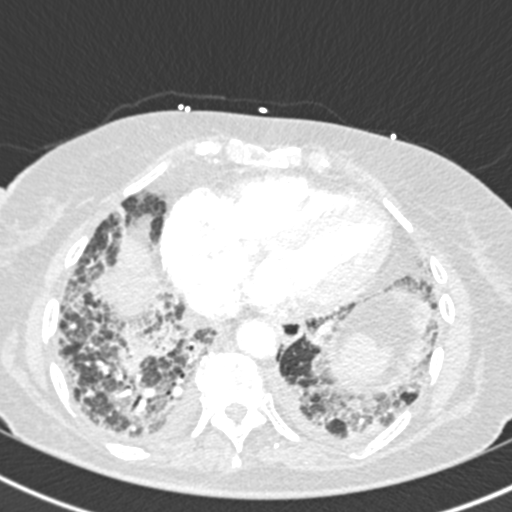
[im 105/267  soft-tissue]
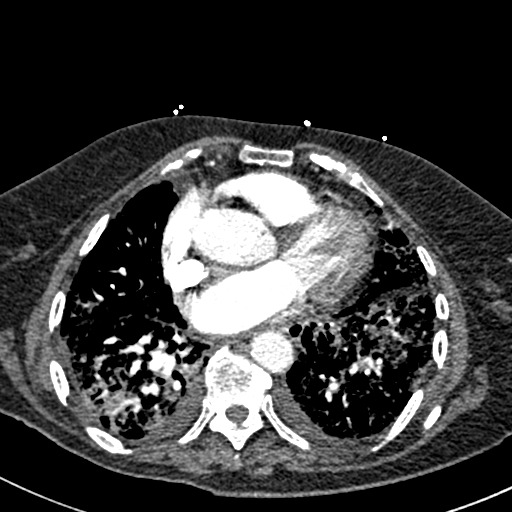
[im 116/267  lung]
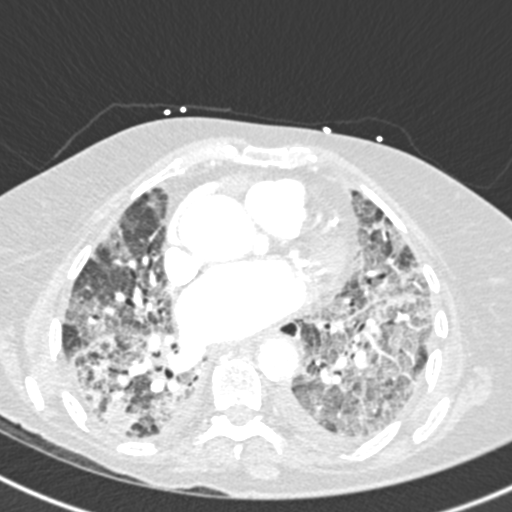
[im 139/267  soft-tissue]
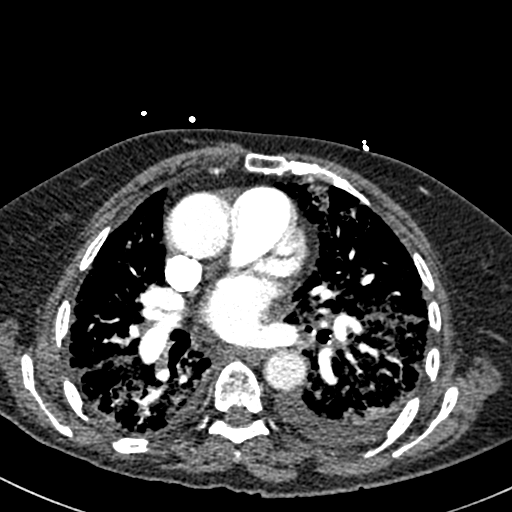
[im 151/267  lung]
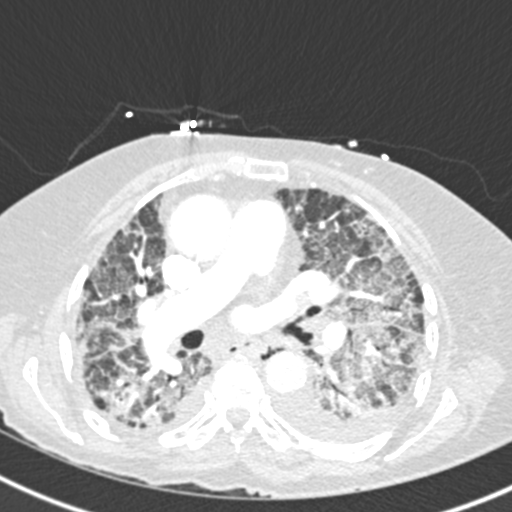
[im 162/267  soft-tissue]
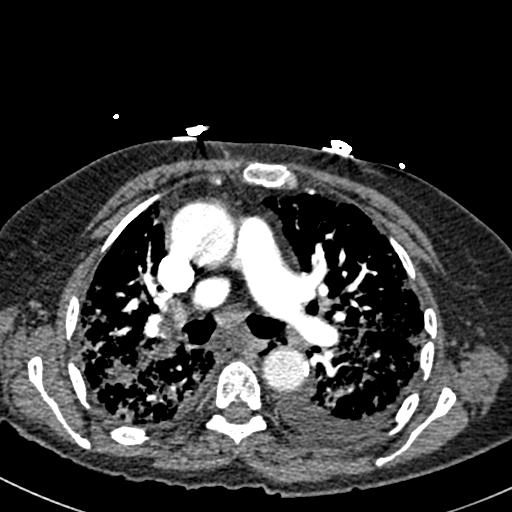
[im 186/267  lung]
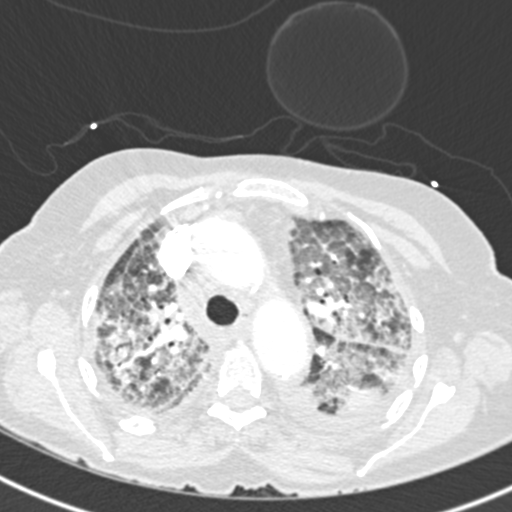
[im 197/267  soft-tissue]
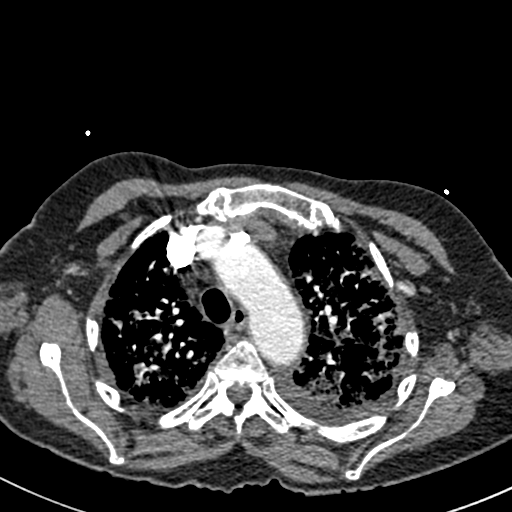
[im 220/267  lung]
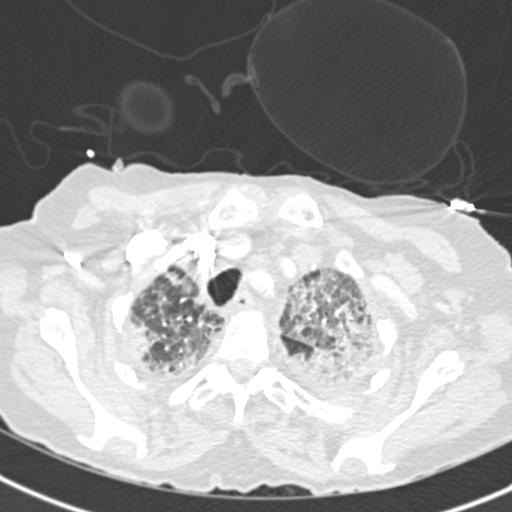
[im 232/267  soft-tissue]
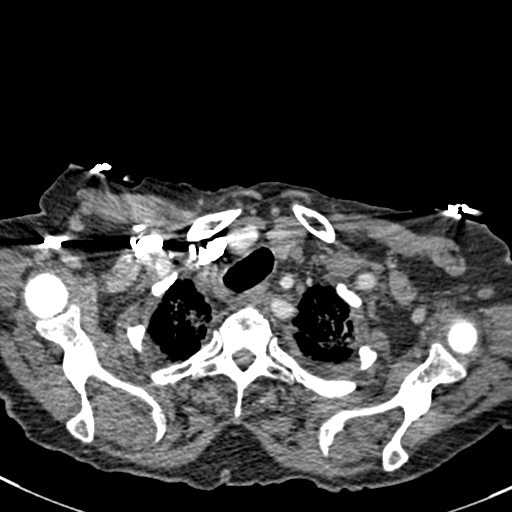
[im 255/267  lung]
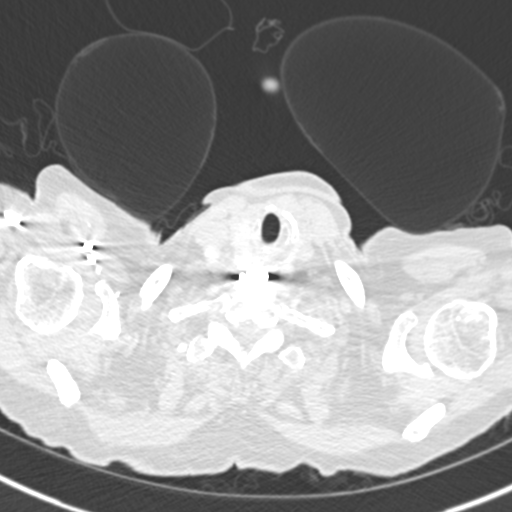

[Series 7: coronal mpr · coronal · 0.55mm/px · 3 of 129 slices shown]
[im 33/129  soft-tissue]
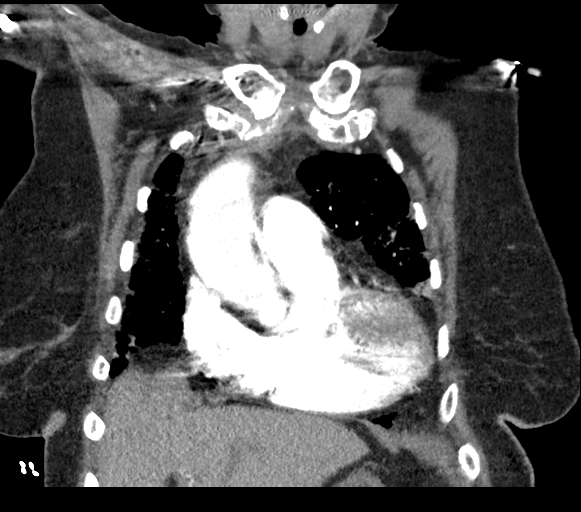
[im 65/129  soft-tissue]
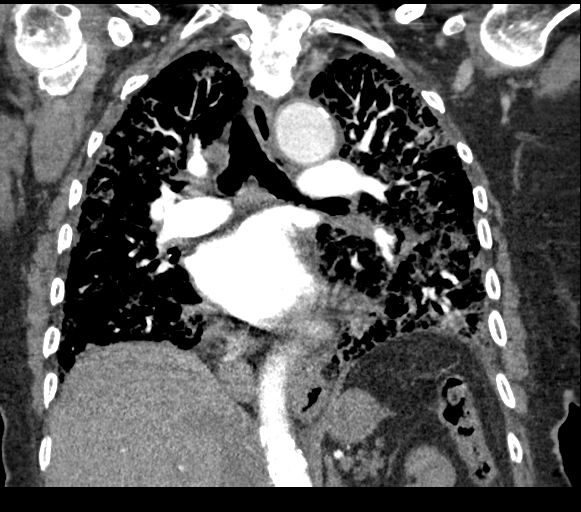
[im 97/129  soft-tissue]
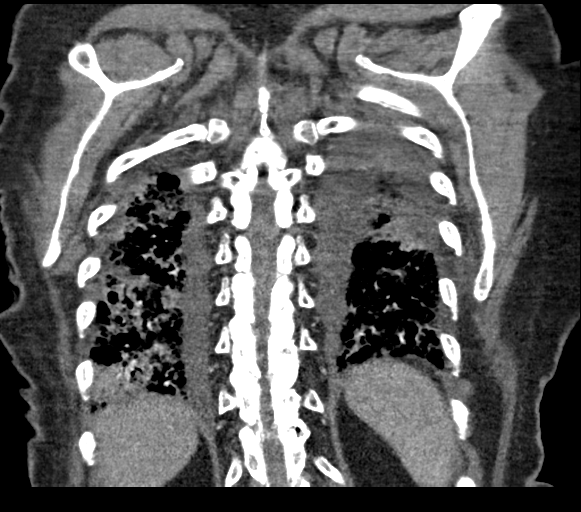

[18 of 46 positions shown; findings below may reference images not displayed]

FINDINGS: Cardiovascular: Borderline cardiomegaly. Minimal calcified plaque
over the left anterior descending and lateral circumflex coronary
arteries. Calcified plaque over the thoracic aorta. There is mild
ectasia of the ascending thoracic aorta measuring 3.8 cm in AP
diameter. No evidence of dissection. Pulmonary arterial system is
well opacified without evidence of emboli.

Mediastinum/Nodes: No evidence of mediastinal or hilar adenopathy.
Suggestion of a small hiatal hernia. Small collection of air
adjacent the left inferior heart border and few tiny foci of air
adjacent the distal esophagus and gastroesophageal junction likely
minimal pneumomediastinum.

Lungs/Pleura: Lungs are somewhat hypoinflated demonstrate
heterogeneous bilateral mixed interstitial airspace process. Small
bilateral pleural effusions left greater than right. No cavitary
lesion identified. Several air bronchograms. Airways are otherwise
within normal.

Upper Abdomen: 1.5 cm cyst over the anterior segment right lobe of
the liver.

Musculoskeletal: Degenerative change of the spine.

Review of the MIP images confirms the above findings.
IMPRESSION: No evidence of pulmonary embolism.

Bilateral patchy heterogeneous mixed interstitial airspace process
with small bilateral pleural effusions with left greater than right.
Findings likely due to diffuse multifocal pneumonia.

Findings suggesting minimal pneumomediastinum as this may be
secondary to esophageal injury given a few small foci of air
adjacent the esophagus.

Mild cardiomegaly.  Mild atherosclerotic coronary artery disease.

Ectasia of the ascending thoracic aorta measuring 3.8 cm in AP
diameter. Recommend annual imaging followup by CTA or MRA. This
recommendation follows [id number]
ACCF/AHA/AATS/ACR/ASA/SCA/HANG KUEN/SCHAANIX/LICIANE/OLHA Guidelines for the
Diagnosis and Management of Patients with Thoracic Aortic Disease.
Circulation.4535; 121: [id number].

1.5 cm liver cyst.

Aortic Atherosclerosis ([id number]-YFU.U).

Critical Value/emergent results were called by telephone at the time
of interpretation on 08/29/2017 at [DATE] to Dr. Sene, who verbally
acknowledged these results.

## 2018-05-05 IMAGING — CT CT CHEST W/O CM
2 of 3 series · 15 of 36 positions shown, 18 images · non-contrast
Comparison: 08/29/2017 chest CT and prior studies

CLINICAL DATA: 89-year-old female with question esophageal injury
with possible pneumomediastinum identified on 08/29/2017 CT. Patient
given oral contrast evaluate for extraluminal contrast/esophageal
injury. History of neck surgery in [DATE]. Also with cough and
lethargy.

EXAM:
CT CHEST WITHOUT CONTRAST
TECHNIQUE: Multidetector CT imaging of the chest was performed following the
standard protocol without IV contrast.

[Series 2: thorax · axial · 0.70mm/px · z∈[+1142,+1396]mm · 12 of 151 slices shown, 15 images]
[im 12/151  mediastinal]
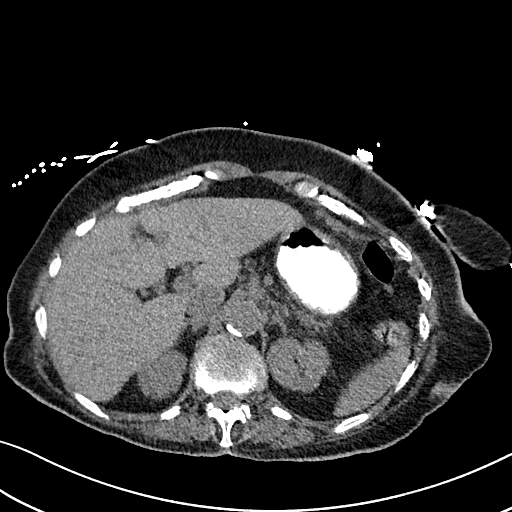
[im 12/151  lung]
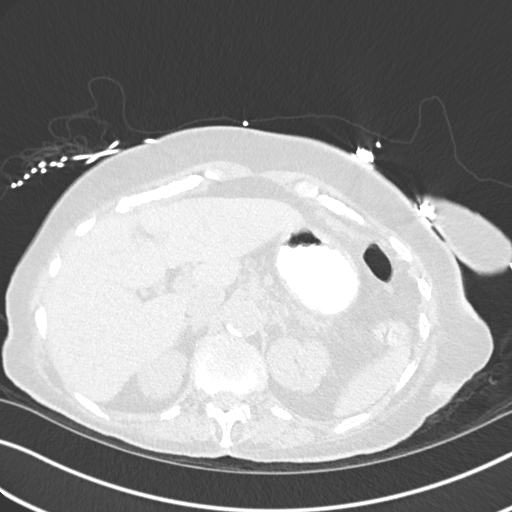
[im 23/151  lung]
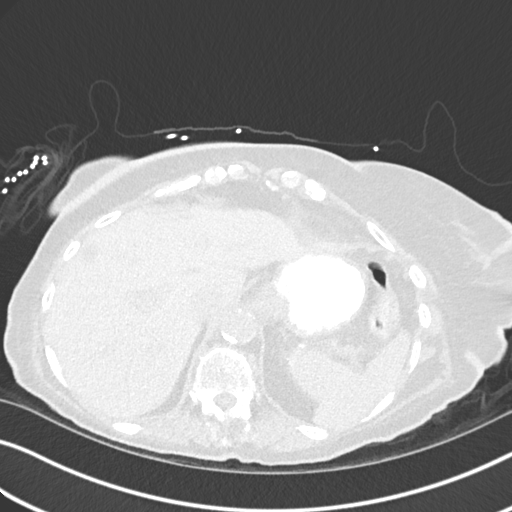
[im 34/151  lung]
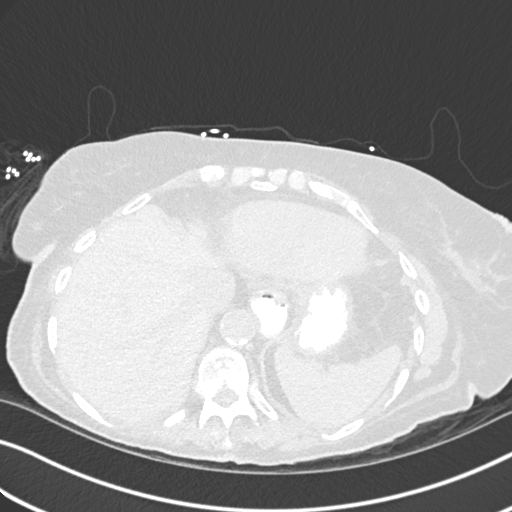
[im 45/151  lung]
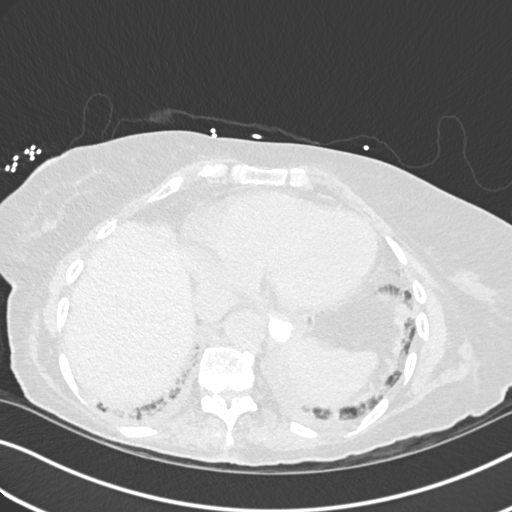
[im 56/151  mediastinal]
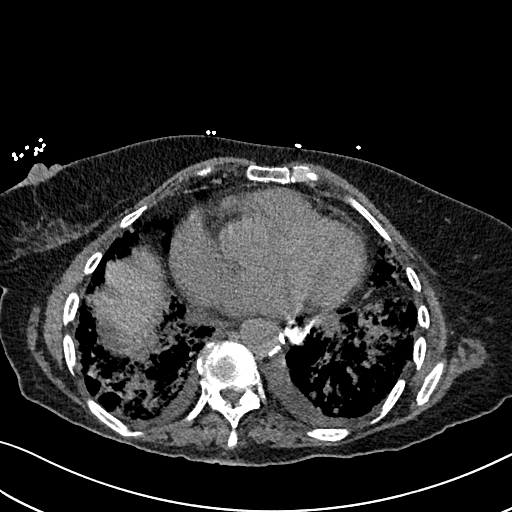
[im 56/151  lung]
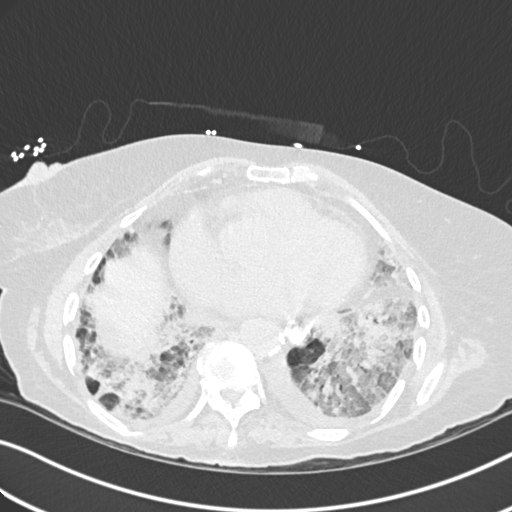
[im 67/151  lung]
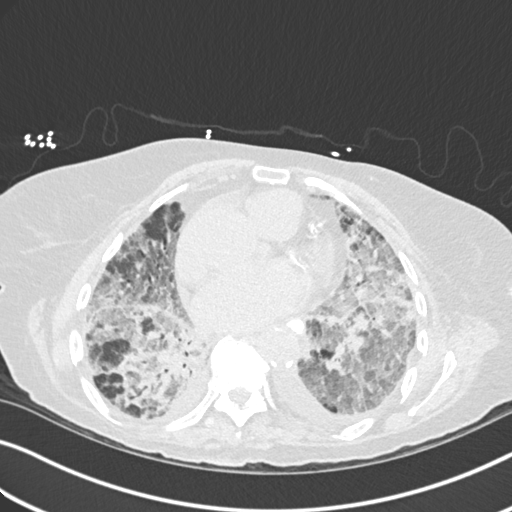
[im 84/151  lung]
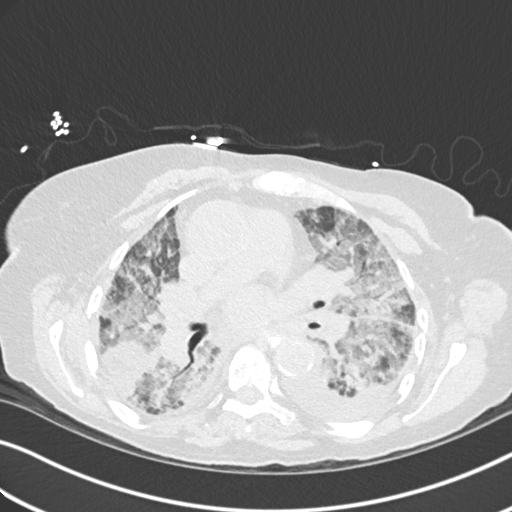
[im 95/151  lung]
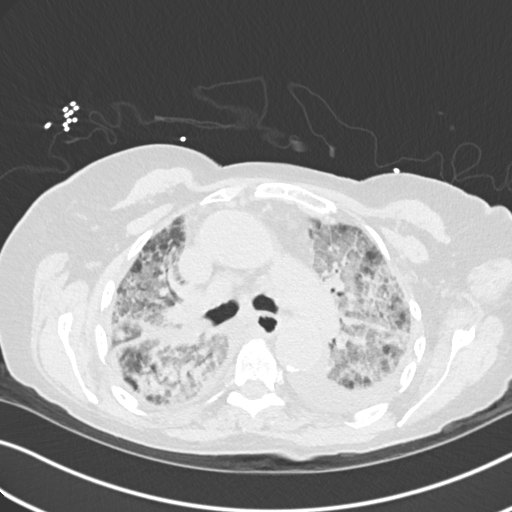
[im 106/151  mediastinal]
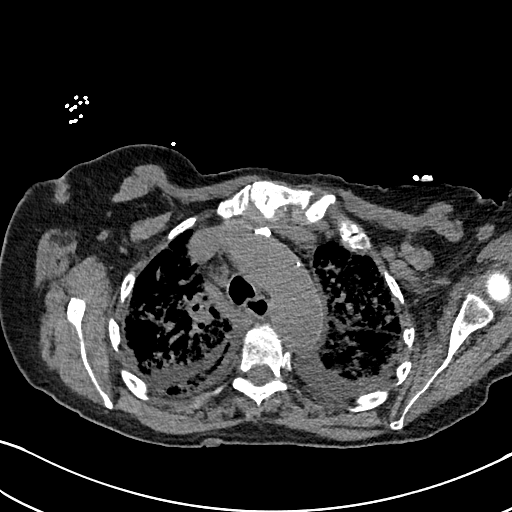
[im 106/151  lung]
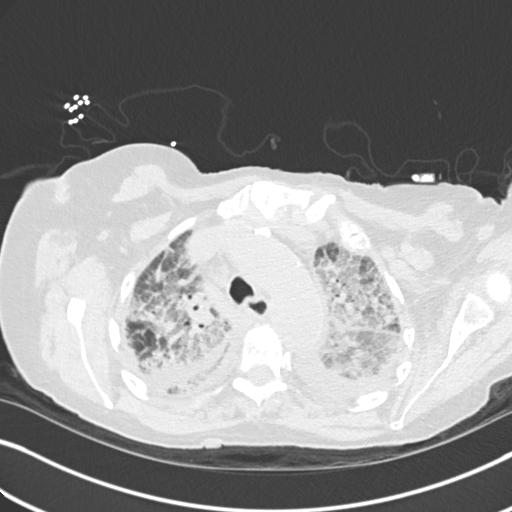
[im 117/151  lung]
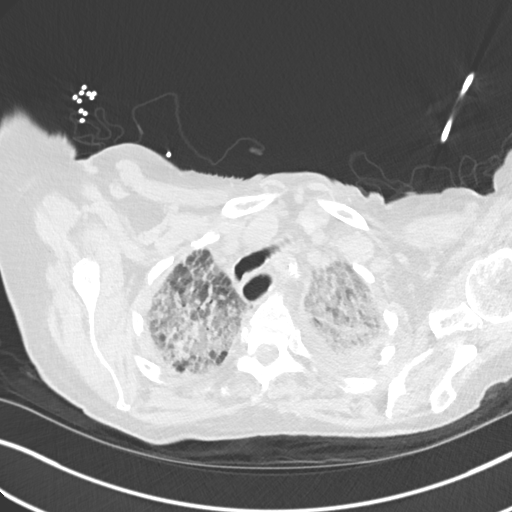
[im 128/151  lung]
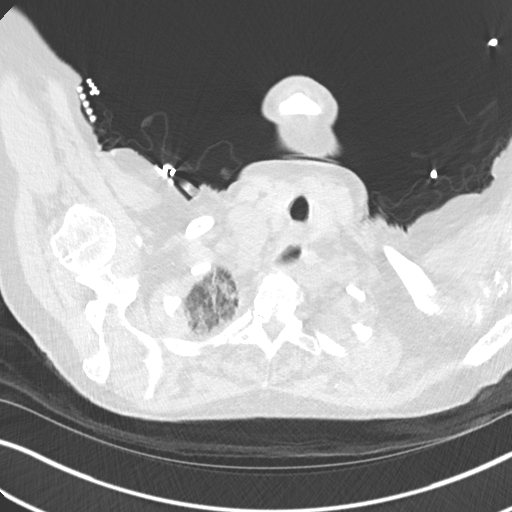
[im 139/151  lung]
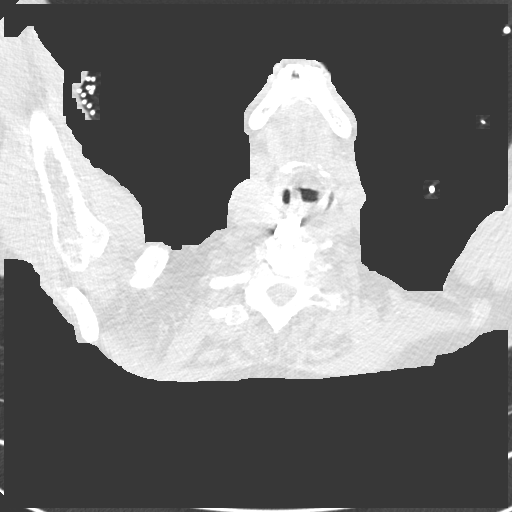

[Series 4: coronal · coronal · 0.57mm/px · 3 of 115 slices shown]
[im 23/115  lung]
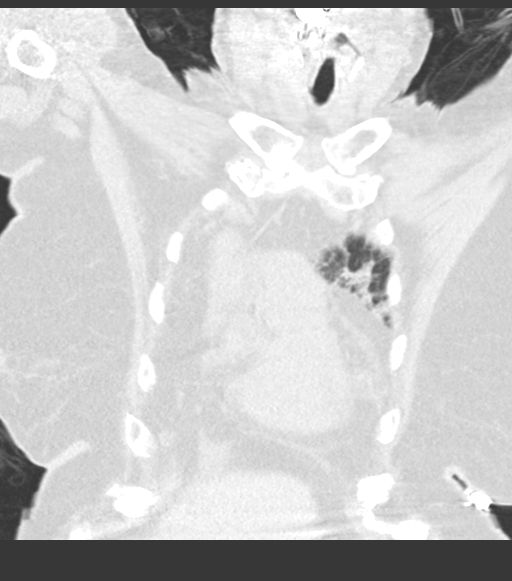
[im 46/115  lung]
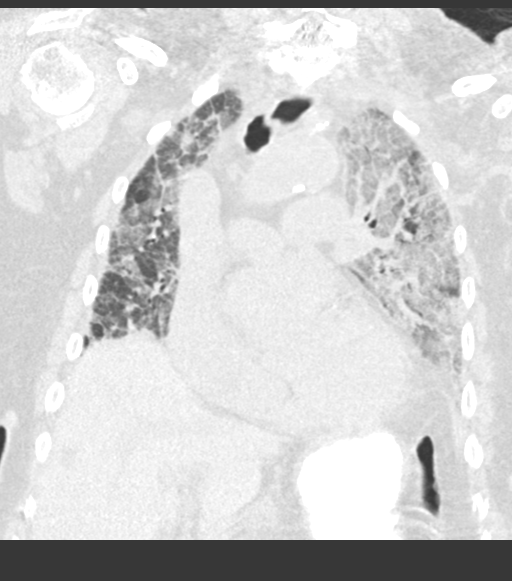
[im 69/115  lung]
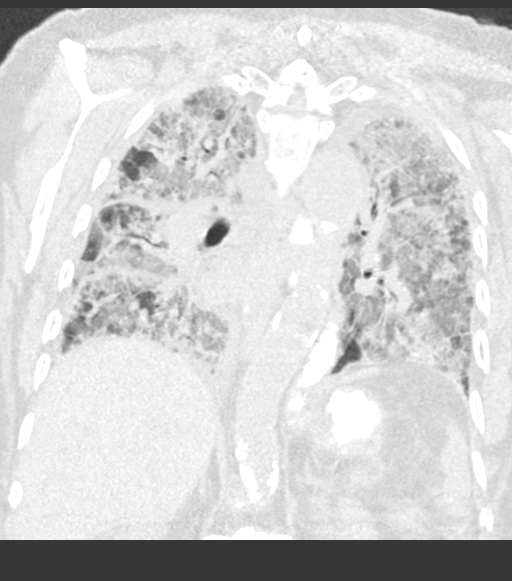

[15 of 36 positions shown; findings below may reference images not displayed]

FINDINGS: Cardiovascular: Cardiomegaly and coronary artery/thoracic aortic
atherosclerotic calcifications again noted. Ectatic ascending
thoracic aorta measuring 3.8 cm again noted. No pericardial
effusion.

Mediastinum/Nodes: Oral contrast within the mid and distal esophagus
and proximal stomach identified. There is no evidence of
extraluminal contrast or mediastinal air on today's study. Shotty
mediastinal and bilateral hilar nodes are again noted.

Lungs/Pleura: Diffuse ground-glass, interstitial and airspace
opacities within both lungs are again noted appear slightly
increased. Small bilateral pleural effusions are again noted,
left-greater-than-right. There is no evidence of pneumothorax.

Upper Abdomen: No acute abnormality. A small hiatal hernia is noted.

Musculoskeletal: No acute or suspicious abnormalities.
IMPRESSION: 1. No evidence of extraluminal oral contrast or mediastinal air on
today's study.
2. Diffuse ground-glass, interstitial and airspace opacities
bilaterally which appear slightly increased. This is nonspecific but
may represent diffuse edema and/or infection.
3. Small bilateral pleural effusions, cardiomegaly and small hiatal
hernia again noted.
4. Ectatic ascending thoracic aorta again noted measuring 3.8 cm.
5. Coronary artery and aortic Atherosclerosis ([id number]).
